# Patient Record
Sex: Female | Born: 1945
Health system: Southern US, Community
[De-identification: ages and names within clinical notes are randomized; demographics above are authoritative.]

## PROBLEM LIST (undated history)

## (undated) DIAGNOSIS — I639 Cerebral infarction, unspecified: Secondary | ICD-10-CM

## (undated) DIAGNOSIS — R002 Palpitations: Secondary | ICD-10-CM

## (undated) DIAGNOSIS — G4730 Sleep apnea, unspecified: Secondary | ICD-10-CM

## (undated) DIAGNOSIS — K5792 Diverticulitis of intestine, part unspecified, without perforation or abscess without bleeding: Secondary | ICD-10-CM

## (undated) DIAGNOSIS — Z91018 Allergy to other foods: Secondary | ICD-10-CM

## (undated) DIAGNOSIS — M1990 Unspecified osteoarthritis, unspecified site: Secondary | ICD-10-CM

## (undated) DIAGNOSIS — N390 Urinary tract infection, site not specified: Secondary | ICD-10-CM

## (undated) DIAGNOSIS — F419 Anxiety disorder, unspecified: Secondary | ICD-10-CM

## (undated) DIAGNOSIS — R51 Headache: Secondary | ICD-10-CM

## (undated) DIAGNOSIS — K7460 Unspecified cirrhosis of liver: Secondary | ICD-10-CM

## (undated) DIAGNOSIS — K648 Other hemorrhoids: Secondary | ICD-10-CM

## (undated) DIAGNOSIS — D649 Anemia, unspecified: Secondary | ICD-10-CM

## (undated) DIAGNOSIS — M2550 Pain in unspecified joint: Secondary | ICD-10-CM

## (undated) DIAGNOSIS — K589 Irritable bowel syndrome without diarrhea: Secondary | ICD-10-CM

## (undated) DIAGNOSIS — I10 Essential (primary) hypertension: Secondary | ICD-10-CM

## (undated) DIAGNOSIS — B1920 Unspecified viral hepatitis C without hepatic coma: Secondary | ICD-10-CM

## (undated) DIAGNOSIS — K5900 Constipation, unspecified: Secondary | ICD-10-CM

## (undated) DIAGNOSIS — F32A Depression, unspecified: Secondary | ICD-10-CM

## (undated) DIAGNOSIS — M549 Dorsalgia, unspecified: Secondary | ICD-10-CM

## (undated) DIAGNOSIS — I700 Atherosclerosis of aorta: Secondary | ICD-10-CM

## (undated) DIAGNOSIS — Q0700 Arnold-Chiari syndrome without spina bifida or hydrocephalus: Secondary | ICD-10-CM

## (undated) DIAGNOSIS — R0602 Shortness of breath: Secondary | ICD-10-CM

## (undated) DIAGNOSIS — K5790 Diverticulosis of intestine, part unspecified, without perforation or abscess without bleeding: Secondary | ICD-10-CM

## (undated) DIAGNOSIS — H35039 Hypertensive retinopathy, unspecified eye: Secondary | ICD-10-CM

## (undated) DIAGNOSIS — R7303 Prediabetes: Secondary | ICD-10-CM

## (undated) DIAGNOSIS — Z8711 Personal history of peptic ulcer disease: Secondary | ICD-10-CM

## (undated) DIAGNOSIS — H269 Unspecified cataract: Secondary | ICD-10-CM

## (undated) DIAGNOSIS — K449 Diaphragmatic hernia without obstruction or gangrene: Secondary | ICD-10-CM

## (undated) DIAGNOSIS — F329 Major depressive disorder, single episode, unspecified: Secondary | ICD-10-CM

## (undated) DIAGNOSIS — K635 Polyp of colon: Secondary | ICD-10-CM

## (undated) DIAGNOSIS — R519 Headache, unspecified: Secondary | ICD-10-CM

## (undated) HISTORY — DX: Depression, unspecified: F32.A

## (undated) HISTORY — DX: Arnold-Chiari syndrome without spina bifida or hydrocephalus: Q07.00

## (undated) HISTORY — DX: Essential (primary) hypertension: I10

## (undated) HISTORY — DX: Sleep apnea, unspecified: G47.30

## (undated) HISTORY — DX: Urinary tract infection, site not specified: N39.0

## (undated) HISTORY — PX: EYE SURGERY: SHX253

## (undated) HISTORY — DX: Hypertensive retinopathy, unspecified eye: H35.039

## (undated) HISTORY — DX: Diverticulitis of intestine, part unspecified, without perforation or abscess without bleeding: K57.92

## (undated) HISTORY — DX: Other hemorrhoids: K64.8

## (undated) HISTORY — DX: Prediabetes: R73.03

## (undated) HISTORY — DX: Unspecified cirrhosis of liver: K74.60

## (undated) HISTORY — DX: Palpitations: R00.2

## (undated) HISTORY — DX: Allergy to other foods: Z91.018

## (undated) HISTORY — DX: Headache, unspecified: R51.9

## (undated) HISTORY — DX: Unspecified viral hepatitis C without hepatic coma: B19.20

## (undated) HISTORY — DX: Polyp of colon: K63.5

## (undated) HISTORY — DX: Unspecified cataract: H26.9

## (undated) HISTORY — PX: CATARACT EXTRACTION: SUR2

## (undated) HISTORY — DX: Headache: R51

## (undated) HISTORY — PX: OVARIAN CYST REMOVAL: SHX89

## (undated) HISTORY — DX: Unspecified osteoarthritis, unspecified site: M19.90

## (undated) HISTORY — DX: Constipation, unspecified: K59.00

## (undated) HISTORY — DX: Personal history of peptic ulcer disease: Z87.11

## (undated) HISTORY — PX: BREAST EXCISIONAL BIOPSY: SUR124

## (undated) HISTORY — DX: Cerebral infarction, unspecified: I63.9

## (undated) HISTORY — DX: Diaphragmatic hernia without obstruction or gangrene: K44.9

## (undated) HISTORY — DX: Dorsalgia, unspecified: M54.9

## (undated) HISTORY — PX: OTHER SURGICAL HISTORY: SHX169

## (undated) HISTORY — DX: Anemia, unspecified: D64.9

## (undated) HISTORY — PX: REDUCTION MAMMAPLASTY: SUR839

## (undated) HISTORY — PX: BREAST SURGERY: SHX581

## (undated) HISTORY — DX: Pain in unspecified joint: M25.50

## (undated) HISTORY — DX: Irritable bowel syndrome, unspecified: K58.9

## (undated) HISTORY — DX: Atherosclerosis of aorta: I70.0

## (undated) HISTORY — DX: Anxiety disorder, unspecified: F41.9

## (undated) HISTORY — DX: Diverticulosis of intestine, part unspecified, without perforation or abscess without bleeding: K57.90

## (undated) HISTORY — DX: Shortness of breath: R06.02

## (undated) HISTORY — PX: DILATION AND CURETTAGE OF UTERUS: SHX78

---
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
## 2001-06-24 HISTORY — PX: BRAIN SURGERY: SHX531

## 2013-07-01 DIAGNOSIS — L02419 Cutaneous abscess of limb, unspecified: Secondary | ICD-10-CM | POA: Diagnosis not present

## 2013-07-01 DIAGNOSIS — S8010XA Contusion of unspecified lower leg, initial encounter: Secondary | ICD-10-CM | POA: Diagnosis not present

## 2013-07-12 DIAGNOSIS — L02419 Cutaneous abscess of limb, unspecified: Secondary | ICD-10-CM | POA: Diagnosis not present

## 2013-07-12 DIAGNOSIS — L03119 Cellulitis of unspecified part of limb: Secondary | ICD-10-CM | POA: Diagnosis not present

## 2013-07-12 DIAGNOSIS — IMO0002 Reserved for concepts with insufficient information to code with codable children: Secondary | ICD-10-CM | POA: Diagnosis not present

## 2013-07-27 DIAGNOSIS — M25569 Pain in unspecified knee: Secondary | ICD-10-CM | POA: Diagnosis not present

## 2013-10-13 DIAGNOSIS — I10 Essential (primary) hypertension: Secondary | ICD-10-CM | POA: Diagnosis not present

## 2013-10-15 DIAGNOSIS — Z1231 Encounter for screening mammogram for malignant neoplasm of breast: Secondary | ICD-10-CM | POA: Diagnosis not present

## 2013-10-18 DIAGNOSIS — I10 Essential (primary) hypertension: Secondary | ICD-10-CM | POA: Diagnosis not present

## 2013-10-18 DIAGNOSIS — F329 Major depressive disorder, single episode, unspecified: Secondary | ICD-10-CM | POA: Diagnosis not present

## 2013-10-18 DIAGNOSIS — F3289 Other specified depressive episodes: Secondary | ICD-10-CM | POA: Diagnosis not present

## 2013-12-10 DIAGNOSIS — R922 Inconclusive mammogram: Secondary | ICD-10-CM | POA: Diagnosis not present

## 2015-01-16 DIAGNOSIS — M179 Osteoarthritis of knee, unspecified: Secondary | ICD-10-CM | POA: Diagnosis not present

## 2015-08-02 DIAGNOSIS — F419 Anxiety disorder, unspecified: Secondary | ICD-10-CM | POA: Diagnosis not present

## 2015-08-02 DIAGNOSIS — G4700 Insomnia, unspecified: Secondary | ICD-10-CM | POA: Diagnosis not present

## 2015-08-02 DIAGNOSIS — M79671 Pain in right foot: Secondary | ICD-10-CM | POA: Diagnosis not present

## 2015-08-02 DIAGNOSIS — I10 Essential (primary) hypertension: Secondary | ICD-10-CM | POA: Diagnosis not present

## 2015-08-02 DIAGNOSIS — M25511 Pain in right shoulder: Secondary | ICD-10-CM | POA: Diagnosis not present

## 2015-09-21 ENCOUNTER — Ambulatory Visit (INDEPENDENT_AMBULATORY_CARE_PROVIDER_SITE_OTHER): Payer: Medicare Other | Admitting: Internal Medicine

## 2015-09-21 ENCOUNTER — Encounter: Payer: Self-pay | Admitting: Internal Medicine

## 2015-09-21 VITALS — BP 160/98 | HR 93 | Temp 97.9°F | Resp 16 | Ht 66.0 in | Wt 182.0 lb

## 2015-09-21 DIAGNOSIS — Q0700 Arnold-Chiari syndrome without spina bifida or hydrocephalus: Secondary | ICD-10-CM | POA: Insufficient documentation

## 2015-09-21 DIAGNOSIS — G4700 Insomnia, unspecified: Secondary | ICD-10-CM | POA: Diagnosis not present

## 2015-09-21 DIAGNOSIS — F4310 Post-traumatic stress disorder, unspecified: Secondary | ICD-10-CM | POA: Diagnosis not present

## 2015-09-21 DIAGNOSIS — I10 Essential (primary) hypertension: Secondary | ICD-10-CM

## 2015-09-21 MED ORDER — TEMAZEPAM 15 MG PO CAPS: 15.0000 mg | ORAL_CAPSULE | Freq: Every evening | ORAL | Status: DC | PRN

## 2015-09-21 MED ORDER — HYDROCHLOROTHIAZIDE 25 MG PO TABS: 25.0000 mg | ORAL_TABLET | Freq: Every day | ORAL | Status: DC

## 2015-09-21 MED ORDER — LORAZEPAM 0.5 MG PO TABS: 0.5000 mg | ORAL_TABLET | Freq: Two times a day (BID) | ORAL | Status: DC | PRN

## 2015-09-21 NOTE — Progress Notes (Signed)
Pre visit review using our clinic review tool, if applicable. No additional management support is needed unless otherwise documented below in the visit note. 

## 2015-09-21 NOTE — Patient Instructions (Signed)
We have sent in the refill of the hctz and given you the refill on the sleeping medicine and the lorazepam.   If you will get us a copy of your physical labs once you get them done.   Come back in about 3 months to check on the blood pressure.  It was a pleasure to meet you today. Please feel free to call us with problems or questions sooner.

## 2015-09-21 NOTE — Assessment & Plan Note (Signed)
Rx for hctz 25 mg daily, she [facility] through [facility] and will get in the next month and get us a copy. No known complications of that. Getting records to confirm.

## 2015-09-21 NOTE — Assessment & Plan Note (Signed)
Doing very well now and s/p many years of therapy. Uses rare lorazepam but not often. Refilled today.

## 2015-09-21 NOTE — Assessment & Plan Note (Signed)
S/P surgery and makes her high risk for spread to CNS and needs early initiation of antibiotics.

## 2015-09-21 NOTE — Assessment & Plan Note (Signed)
Refilled temazepam and uses rarely. Has worked well in the past and she has tried many OTC and herbal remedies for her insomnia without relief. Talked to her about the long term risk of sleeping medications and she agrees and knows to use only when needed.

## 2015-09-21 NOTE — Progress Notes (Signed)
   Subjective:    Patient ID: Angela Ramirez, female    DOB: 12/13/1945, 70 y.o.   MRN: [id number]  HPI The patient is a new 70 YO female coming in for insomnia. She has history of PTSD from her childhood which is under fairly good control right now. She does have cyclical problems with sleeping and uses temazepam during those times. She is currently in that cycle and does not have any temazepam. She had arnold chiari malformation and delayed diagnosis with extensive recovery. She does still have struggles with that and trouble trusting the medical establishment due to the way she was treated. Denies chest pains, SOB, constipation, diarrhea. She does exercise generally. She is very anxious today and is out of her blood pressure medication.   [facility], social history reviewed and updated.   Review of Systems  Constitutional: Negative for fever, activity change, appetite change, fatigue and unexpected weight change.  HENT: Negative.   Eyes: Negative.   Respiratory: Negative for cough, chest tightness, shortness of breath and wheezing.   Cardiovascular: Positive for palpitations. Negative for chest pain and leg swelling.       With anxiety attacks  Gastrointestinal: Negative for nausea, abdominal pain, diarrhea, constipation and abdominal distention.  Musculoskeletal: Negative for myalgias, back pain and arthralgias.  Skin: Negative.   Neurological: Negative.   Psychiatric/Behavioral: Positive for sleep disturbance and agitation. Negative for suicidal ideas, self-injury, dysphoric mood and decreased concentration. The patient is nervous/anxious.       Objective:   Physical Exam  Constitutional: She is oriented to person, place, and time. She appears well-developed and well-nourished.  HENT:  Head: Atraumatic.  Prior chiari surgery  Eyes: EOM are normal.  Neck: Normal range of motion.  Prominence over the right carotid, stable per patient for years  Cardiovascular: Normal rate and regular  rhythm.   No murmur heard. Pulmonary/Chest: Effort normal and breath sounds normal. No respiratory distress. She has no wheezes. She has no rales.  Abdominal: Soft. Bowel sounds are normal. She exhibits no distension. There is no tenderness. There is no rebound.  Musculoskeletal: She exhibits no edema.  Neurological: She is alert and oriented to person, place, and time. Coordination normal.  Skin: Skin is warm and dry.  Psychiatric: She has a normal mood and affect.   Filed Vitals:   09/21/15 0903  BP: 160/98  Pulse: 93  Temp: 97.9 F (36.6 C)  TempSrc: Oral  Resp: 16  Height: 5\' 6"  (1.676 m)  Weight: 182 lb (82.555 kg)  SpO2: 98%      Assessment & Plan:

## 2015-11-09 ENCOUNTER — Other Ambulatory Visit: Payer: Self-pay | Admitting: Internal Medicine

## 2015-11-09 NOTE — Telephone Encounter (Signed)
MD out of office pls advise on refill.../lmb 

## 2015-11-09 NOTE — Telephone Encounter (Signed)
Faxed script back to [facility].../lmb 

## 2016-01-11 ENCOUNTER — Other Ambulatory Visit (INDEPENDENT_AMBULATORY_CARE_PROVIDER_SITE_OTHER): Payer: Medicare Other

## 2016-01-11 ENCOUNTER — Encounter: Payer: Self-pay | Admitting: Internal Medicine

## 2016-01-11 ENCOUNTER — Ambulatory Visit (INDEPENDENT_AMBULATORY_CARE_PROVIDER_SITE_OTHER): Payer: Medicare Other | Admitting: Internal Medicine

## 2016-01-11 VITALS — BP 160/90 | HR 82 | Temp 98.3°F | Resp 16 | Ht 66.5 in | Wt 179.0 lb

## 2016-01-11 DIAGNOSIS — I10 Essential (primary) hypertension: Secondary | ICD-10-CM

## 2016-01-11 DIAGNOSIS — G4700 Insomnia, unspecified: Secondary | ICD-10-CM

## 2016-01-11 DIAGNOSIS — Z23 Encounter for immunization: Secondary | ICD-10-CM | POA: Diagnosis not present

## 2016-01-11 LAB — LIPID PANEL
CHOL/HDL RATIO: 2
Cholesterol: 189 mg/dL (ref 0–200)
HDL: 91.7 mg/dL (ref >39.00)
LDL Cholesterol: 89 mg/dL (ref 0–99)
NONHDL: 97.05
TRIGLYCERIDES: 42 mg/dL (ref 0.0–149.0)
VLDL: 8.4 mg/dL (ref 0.0–40.0)

## 2016-01-11 LAB — CBC
HCT: 42.1 % (ref 36.0–46.0)
Hemoglobin: 14.1 g/dL (ref 12.0–15.0)
MCHC: 33.4 g/dL (ref 30.0–36.0)
MCV: 90.2 fl (ref 78.0–100.0)
Platelets: 303 10*3/uL (ref 150.0–400.0)
RBC: 4.67 Mil/uL (ref 3.87–5.11)
RDW: 13.9 % (ref 11.5–15.5)
WBC: 7.1 10*3/uL (ref 4.0–10.5)

## 2016-01-11 LAB — COMPREHENSIVE METABOLIC PANEL
ALBUMIN: 4.4 g/dL (ref 3.5–5.2)
ALK PHOS: 83 U/L (ref 39–117)
ALT: 32 U/L (ref 0–35)
AST: 37 U/L (ref 0–37)
BUN: 14 mg/dL (ref 6–23)
CO2: 29 mEq/L (ref 19–32)
CREATININE: 0.66 mg/dL (ref 0.40–1.20)
Calcium: 9.9 mg/dL (ref 8.4–10.5)
Chloride: 95 mEq/L — ABNORMAL LOW (ref 96–112)
GFR: 94.76 mL/min (ref >60.00)
Glucose, Bld: 89 mg/dL (ref 70–99)
Potassium: 3.5 mEq/L (ref 3.5–5.1)
SODIUM: 134 meq/L — AB (ref 135–145)
TOTAL PROTEIN: 8.6 g/dL — AB (ref 6.0–8.3)
Total Bilirubin: 0.6 mg/dL (ref 0.2–1.2)

## 2016-01-11 NOTE — Assessment & Plan Note (Signed)
Checking CMP and adjust as needed. Reminded she needs to resume her hctz 25 mg daily as this helps her BP.

## 2016-01-11 NOTE — Progress Notes (Signed)
   Subjective:    Patient ID: Angela Ramirez, female    DOB: Jun 16, 1946, 70 y.o.   MRN: [id number]  HPI The patient is a 70 YO female coming in for follow up of her blood pressure. She is out of her medications for about 2 weeks. She has not had any headaches or migraines. No chest pains or SOB. No abdominal pain or nausea or vomiting.   Review of Systems  Constitutional: Negative for fever, activity change, appetite change, fatigue and unexpected weight change.  Respiratory: Negative for cough, chest tightness, shortness of breath and wheezing.   Cardiovascular: Negative for chest pain, palpitations and leg swelling.  Gastrointestinal: Negative for nausea, abdominal pain, diarrhea, constipation and abdominal distention.  Musculoskeletal: Negative for myalgias, back pain and arthralgias.  Skin: Negative.   Neurological: Negative.   Psychiatric/Behavioral: Positive for sleep disturbance. Negative for suicidal ideas, self-injury, dysphoric mood and decreased concentration.      Objective:   Physical Exam  Constitutional: She is oriented to person, place, and time. She appears well-developed and well-nourished.  HENT:  Head: Atraumatic.  Prior chiari surgery  Eyes: EOM are normal.  Neck: Normal range of motion.  Cardiovascular: Normal rate and regular rhythm.   No murmur heard. Pulmonary/Chest: Effort normal and breath sounds normal. No respiratory distress. She has no wheezes. She has no rales.  Abdominal: Soft. Bowel sounds are normal. She exhibits no distension. There is no tenderness. There is no rebound.  Musculoskeletal: She exhibits no edema.  Neurological: She is alert and oriented to person, place, and time. Coordination normal.  Skin: Skin is warm and dry.  Psychiatric: She has a normal mood and affect.    Filed Vitals:   01/11/16 1301  BP: 160/90  Pulse: 82  Temp: 98.3 F (36.8 C)  TempSrc: Oral  Resp: 16  Height: 5' 6.5" (1.689 m)  Weight: 179 lb (81.194 kg)    SpO2: 97%      Assessment & Plan:  Prevnar 13 and tdap given at visit

## 2016-01-11 NOTE — Progress Notes (Signed)
Pre visit review using our clinic review tool, if applicable. No additional management support is needed unless otherwise documented below in the visit note. 

## 2016-01-11 NOTE — Patient Instructions (Signed)
We will check the labs today and call you back with the results.    

## 2016-01-11 NOTE — Assessment & Plan Note (Addendum)
Temazepam not refilled today as 6 month supply done 09/21/15 and not due for refill. Reviewed [place] narcotic database and they do not have any record of her filling any controlled substances. Also will not refill lorazepam today for same reason.

## 2016-01-15 ENCOUNTER — Telehealth: Payer: Self-pay | Admitting: *Deleted

## 2016-01-15 MED ORDER — HYDROCHLOROTHIAZIDE 25 MG PO TABS: 25.0000 mg | ORAL_TABLET | Freq: Every day | ORAL | 3 refills | Status: DC

## 2016-01-15 MED ORDER — TEMAZEPAM 15 MG PO CAPS: 15.0000 mg | ORAL_CAPSULE | Freq: Every evening | ORAL | 3 refills | Status: DC | PRN

## 2016-01-15 NOTE — Telephone Encounter (Signed)
I have never prescribed lorazepam for her and have refilled her temazepam. Since both lorazepam and temazepam are the same kind of medication I do not prescribe both to patients.

## 2016-01-15 NOTE — Telephone Encounter (Signed)
Left msg on triage stating saw MD last week was told refills will be sent to pharmacy, but they did not received. Pt states she is needing her Lorazepam, Temazepam , and HCTZ. Sent Maintenance pls advise on controls...Raechel Chute

## 2016-01-15 NOTE — Telephone Encounter (Signed)
Called pt no answer LMOM (C) with md response. F[facility]ed [facility] to [facility]...Raechel Chute

## 2016-02-07 ENCOUNTER — Telehealth: Payer: Self-pay | Admitting: Internal Medicine

## 2016-02-07 NOTE — Telephone Encounter (Signed)
Patient walked in to request that we fill the LORazepam (ATIVAN) 0.5 MG tablet [[id number]][[id number]]  . She states that she would like to replace the sleeping pill with the ativan. She is mixing the ativan with an herbal remedy (with melatonin, lemon balm, passion flower). [facility]

## 2016-02-08 NOTE — Telephone Encounter (Signed)
She would need to come in for visit since I have never prescribed the ativan and she just got temazepam which is the same kind of medicine and I do not prescribe both at once to patients.

## 2016-02-08 NOTE — Telephone Encounter (Signed)
Called and no answer no vm- only rang

## 2016-02-16 ENCOUNTER — Other Ambulatory Visit: Payer: Self-pay | Admitting: Internal Medicine

## 2016-02-16 NOTE — Telephone Encounter (Signed)
Pt called back. States she has stopped taking the temazepam.She doesn't like how it makes her feel. She would prefer to take the Lorazepam instead. She also states Dr Okey Duprerawford has prescribed ativan for her before. Please follow up thanks.

## 2016-02-19 MED ORDER — LORAZEPAM 0.5 MG PO TABS
0.5000 mg | ORAL_TABLET | Freq: Two times a day (BID) | ORAL | 1 refills | Status: DC | PRN
Start: 2016-02-19 — End: 2017-08-18

## 2016-02-19 NOTE — Telephone Encounter (Signed)
Fine, we will remove temazepam from her list and rx ativan.

## 2016-02-19 NOTE — Telephone Encounter (Signed)
Faxed to [facility].

## 2016-05-29 ENCOUNTER — Ambulatory Visit: Payer: Medicare Other | Admitting: Internal Medicine

## 2016-07-06 ENCOUNTER — Encounter: Payer: Self-pay | Admitting: Family

## 2016-07-06 ENCOUNTER — Ambulatory Visit (INDEPENDENT_AMBULATORY_CARE_PROVIDER_SITE_OTHER): Payer: 59 | Admitting: Family

## 2016-07-06 VITALS — BP 130/80 | HR 97 | Temp 99.1°F | Resp 18 | Ht 66.5 in | Wt 191.0 lb

## 2016-07-06 DIAGNOSIS — J209 Acute bronchitis, unspecified: Secondary | ICD-10-CM

## 2016-07-06 MED ORDER — HYDROCODONE-HOMATROPINE 5-1.5 MG/5ML PO SYRP
5.0000 mL | ORAL_SOLUTION | Freq: Every evening | ORAL | 0 refills | Status: DC | PRN
Start: 2016-07-06 — End: 2016-08-29

## 2016-07-06 MED ORDER — ALBUTEROL SULFATE HFA 108 (90 BASE) MCG/ACT IN AERS: 2.0000 | INHALATION_SPRAY | Freq: Four times a day (QID) | RESPIRATORY_TRACT | 1 refills | Status: DC | PRN

## 2016-07-06 MED ORDER — AZITHROMYCIN 250 MG PO TABS: ORAL_TABLET | ORAL | 0 refills | Status: DC

## 2016-07-06 NOTE — Progress Notes (Signed)
Pre visit review using our clinic review tool, if applicable. No additional management support is needed unless otherwise documented below in the visit note. 

## 2016-07-06 NOTE — Patient Instructions (Signed)
If you need an inhaler at any point, I have sent it in.   Please take cough medication at night only as needed. As we discussed, I do not recommend dosing throughout the day as coughing is a protective mechanism . It also helps to break up thick mucous.  Do not take cough suppressants with alcohol as can lead to trouble breathing. Advise caution if taking cough suppressant and operating machinery ( i.e driving a car) as you may feel very tired.   Increase intake of clear fluids. Congestion is best treated by hydration, when mucus is wetter, it is thinner, less sticky, and easier to expel from the body, either through coughing up drainage, or by blowing your nose.   Get plenty of rest.   Use saline nasal drops and blow your nose frequently. Run a humidifier at night and elevate the head of the bed. Vicks Vapor rub will help with congestion and cough. Steam showers and sinus massage for congestion.   Use Acetaminophen or Ibuprofen as needed for fever or pain. Avoid second hand smoke. Even the smallest exposure will worsen symptoms.   Over the counter medications you can try include Delsym for cough, a decongestant for congestion, and Mucinex or Robitussin as an expectorant. Be sure to just get the plain Mucinex or Robitussin that just has one medication (Guaifenesen). We don't recommend the combination products. Note, be sure to drink two glasses of water with each dose of Mucinex as the medication will not work well without adequate hydration.   You can also try a teaspoon of honey to see if this will help reduce cough. Throat lozenges can sometimes be beneficial as well.    This illness will typically last 7 - 10 days.   Please follow up with our clinic if you develop a fever greater than 101 F, symptoms worsen, or do not resolve in the next week.

## 2016-07-06 NOTE — Progress Notes (Signed)
Subjective:    Patient ID: Angela Ramirez, female    DOB: 10/23/1945, 71 y.o.   MRN: [id number]  CC: Angela Ramirez is a 71 y.o. female who presents today for an acute visit.    HPI: CC: right ear ache, congestion, productive cough x one week ago, unchanged Started after being exposed to' mold-x paint', cough started then, then resolved.  No fever, chills, wheezing, SOB. Tried benadrly with some relief.    H/o arnold-chiari malformation.    Years ago needed an inhaler for wheezing.    HISTORY:  Past Medical History:  Diagnosis Date  . Arnold-Chiari malformation (HCC)   . Arthritis   . Colon polyps   . Headache   . Hypertension   . UTI (urinary tract infection)    Past Surgical History:  Procedure Laterality Date  . BRAIN SURGERY  2003   decompression  . BREAST SURGERY     biopsy  . polyp removal     Family History  Problem Relation Age of Onset  . Arthritis Mother   . Hypertension Mother   . Kidney disease Mother   . Diabetes Mother   . Alcohol abuse Father   . Arthritis Father   . Cancer Father     lung    Allergies: Morphine and related and Sulfa antibiotics Current Outpatient Prescriptions on File Prior to Visit  Medication Sig Dispense Refill  . hydrochlorothiazide (HYDRODIURIL) 25 MG tablet Take 1 tablet (25 mg total) by mouth daily. 90 tablet 3  . LORazepam (ATIVAN) 0.5 MG tablet Take 1 tablet (0.5 mg total) by mouth 2 (two) times daily as needed. 30 tablet 1   No current facility-administered medications on file prior to visit.     Social History  Substance Use Topics  . Smoking status: Former Games developer  . Smokeless tobacco: Not on file  . Alcohol use No    Review of Systems  Constitutional: Negative for chills and fever.  HENT: Positive for congestion, ear pain (right) and sore throat.   Eyes: Negative for visual disturbance.  Respiratory: Positive for cough. Negative for shortness of breath and wheezing.   Cardiovascular: Negative for chest  pain and palpitations.  Gastrointestinal: Negative for nausea and vomiting.  Neurological: Negative for headaches.      Objective:    BP 130/80 (BP Location: Left Arm, Patient Position: Sitting, Cuff Size: Large)   Pulse 97   Temp 99.1 F (37.3 C) (Oral)   Resp 18   Ht 5' 6.5" (1.689 m)   Wt 191 lb (86.6 kg)   SpO2 98%   BMI 30.37 kg/m    Physical Exam  Constitutional: She appears well-developed and well-nourished.  HENT:  Head: Normocephalic and atraumatic.  Right Ear: Hearing, tympanic membrane, external ear and ear canal normal. No drainage, swelling or tenderness. No foreign bodies. Tympanic membrane is not erythematous and not bulging. No middle ear effusion. No decreased hearing is noted.  Left Ear: Hearing, tympanic membrane, external ear and ear canal normal. No drainage, swelling or tenderness. No foreign bodies. Tympanic membrane is not erythematous and not bulging.  No middle ear effusion. No decreased hearing is noted.  Nose: Nose normal. No rhinorrhea. Right sinus exhibits no maxillary sinus tenderness and no frontal sinus tenderness. Left sinus exhibits no maxillary sinus tenderness and no frontal sinus tenderness.  Mouth/Throat: Uvula is midline, oropharynx is clear and moist and mucous membranes are normal. No oropharyngeal exudate, posterior oropharyngeal edema, posterior oropharyngeal erythema or tonsillar abscesses.  Eyes:  Conjunctivae are normal.  Cardiovascular: Regular rhythm, normal heart sounds and normal pulses.   Pulmonary/Chest: Effort normal and breath sounds normal. She has no wheezes. She has no rhonchi. She has no rales.  Lymphadenopathy:       Head (right side): No submental, no submandibular, no tonsillar, no preauricular, no posterior auricular and no occipital adenopathy present.       Head (left side): No submental, no submandibular, no tonsillar, no preauricular, no posterior auricular and no occipital adenopathy present.    She has no cervical  adenopathy.  Neurological: She is alert.  Skin: Skin is warm and dry.  Psychiatric: She has a normal mood and affect. Her speech is normal and behavior is normal. Thought content normal.  Vitals reviewed.      Assessment & Plan:   1. Acute bronchitis, unspecified organism Afebrile. sa02 98%. Based on duration of symptoms and patient's h/o arnold chiari, we jointly agreed to go ahead and start azithromycin. Gave rx for albuterol if patient were to have any wheezing, though not present today.  - HYDROcodone-homatropine (HYCODAN) 5-1.5 MG/5ML syrup; Take 5 mLs by mouth at bedtime as needed for cough.  Dispense: 30 mL; Refill: 0 - azithromycin (ZITHROMAX) 250 MG tablet; Tale 500 mg PO on day 1, then 250 mg PO q24h x 4 days.  Dispense: 6 tablet; Refill: 0 - albuterol (PROVENTIL HFA) 108 (90 Base) MCG/ACT inhaler; Inhale 2 puffs into the lungs every 6 (six) hours as needed for wheezing or shortness of breath.  Dispense: 1 Inhaler; Refill: 1    I am having Angela Ramirez maintain her hydrochlorothiazide and LORazepam.   No orders of the defined types were placed in this encounter.   Return precautions given.   Risks, benefits, and alternatives of the medications and treatment plan prescribed today were discussed, and patient expressed understanding.   Education regarding symptom management and diagnosis given to patient on AVS.  Continue to follow with Angela BrokerElizabeth A Crawford, MD for routine health maintenance.   Angela Ramirez and I agreed with plan.   Angela PlowmanMargaret Jilliana Burkes, FNP

## 2016-07-11 ENCOUNTER — Ambulatory Visit: Payer: Medicare Other | Admitting: Internal Medicine

## 2016-08-29 ENCOUNTER — Ambulatory Visit (INDEPENDENT_AMBULATORY_CARE_PROVIDER_SITE_OTHER): Payer: 59 | Admitting: Adult Health

## 2016-08-29 ENCOUNTER — Ambulatory Visit: Payer: Medicare Other | Admitting: Family Medicine

## 2016-08-29 VITALS — BP 154/78 | Temp 98.0°F | Ht 66.5 in | Wt 189.5 lb

## 2016-08-29 DIAGNOSIS — Z77120 Contact with and (suspected) exposure to mold (toxic): Secondary | ICD-10-CM | POA: Diagnosis not present

## 2016-08-29 MED ORDER — PREDNISONE 10 MG PO TABS: 10.0000 mg | ORAL_TABLET | Freq: Every day | ORAL | 0 refills | Status: DC

## 2016-08-29 NOTE — Progress Notes (Signed)
Subjective:    Patient ID: Angela Ramirez, female    DOB: 12-Apr-1946, 71 y.o.   MRN: [id number]  HPI 71 year old female who  has a past medical history of Arnold-Chiari malformation (HCC); Arthritis; Colon polyps; Headache; Hypertension; and UTI (urinary tract infection).  She was seen by Rennie Plowman on 07/06/2016 for bronchitis like symptoms after black mold exposure in her home ( was treated with Z pack, Hycodan and albuterol inhaler) . Over the weekend she was exposed to black mold again in her home. She reports that since that time she has experienced a dry cough, fatigue,and episodes of SOB.   She denies any n/v/d    Review of Systems See HPI   Past Medical History:  Diagnosis Date  . Arnold-Chiari malformation (HCC)   . Arthritis   . Colon polyps   . Headache   . Hypertension   . UTI (urinary tract infection)     Social History   Social History  . Marital status: Widowed    Spouse name: N/A  . Number of children: N/A  . Years of education: N/A   Occupational History  . Not on file.   Social History Main Topics  . Smoking status: Former Games developer  . Smokeless tobacco: Not on file  . Alcohol use No  . Drug use: No  . Sexual activity: Not on file   Other Topics Concern  . Not on file   Social History Narrative  . No narrative on file    Past Surgical History:  Procedure Laterality Date  . BRAIN SURGERY  2003   decompression  . BREAST SURGERY     biopsy  . polyp removal      Family History  Problem Relation Age of Onset  . Arthritis Mother   . Hypertension Mother   . Kidney disease Mother   . Diabetes Mother   . Alcohol abuse Father   . Arthritis Father   . Cancer Father     lung    Allergies  Allergen Reactions  . Morphine And Related   . Sulfa Antibiotics     Current Outpatient Prescriptions on File Prior to Visit  Medication Sig Dispense Refill  . azithromycin (ZITHROMAX) 250 MG tablet Tale 500 mg PO on day 1, then 250 mg PO q24h x  4 days. 6 tablet 0  . hydrochlorothiazide (HYDRODIURIL) 25 MG tablet Take 1 tablet (25 mg total) by mouth daily. 90 tablet 3  . albuterol (PROVENTIL HFA) 108 (90 Base) MCG/ACT inhaler Inhale 2 puffs into the lungs every 6 (six) hours as needed for wheezing or shortness of breath. (Patient not taking: Reported on 08/29/2016) 1 Inhaler 1  . LORazepam (ATIVAN) 0.5 MG tablet Take 1 tablet (0.5 mg total) by mouth 2 (two) times daily as needed. (Patient not taking: Reported on 08/29/2016) 30 tablet 1   No current facility-administered medications on file prior to visit.     BP (!) 154/78   Temp 98 F (36.7 C) (Oral)   Ht 5' 6.5" (1.689 m)   Wt 189 lb 8 oz (86 kg)   BMI 30.13 kg/m       Objective:   Physical Exam  Constitutional: She is oriented to person, place, and time. She appears well-developed and well-nourished. No distress.  HENT:  Head: Normocephalic and atraumatic.  Right Ear: External ear normal.  Left Ear: External ear normal.  Nose: Nose normal.  Mouth/Throat: Oropharynx is clear and moist. No oropharyngeal exudate.  Cardiovascular:  Normal rate, regular rhythm, normal heart sounds and intact distal pulses.  Exam reveals no gallop and no friction rub.   No murmur heard. Pulmonary/Chest: Effort normal and breath sounds normal. No respiratory distress. She has no wheezes. She has no rales. She exhibits no tenderness.  Musculoskeletal: She exhibits no edema, tenderness or deformity.  Neurological: She is alert and oriented to person, place, and time.  Skin: Skin is warm and dry. No rash noted. She is not diaphoretic. No erythema. No pallor.  Psychiatric: She has a normal mood and affect. Her behavior is normal. Judgment and thought content normal.  Nursing note and vitals reviewed.     Assessment & Plan:  1. Mold exposure - Will treat mold exposure symptoms with 5 days of low dose prednisone. Can take Mucinex for cough as well as use inhaler if she starts to feel SOB or wheezy -  predniSONE (DELTASONE) 10 MG tablet; Take 1 tablet (10 mg total) by mouth daily with breakfast.  Dispense: 5 tablet; Refill: 0 - Follow up as needed  Shirline Freesory Yosgart Pavey, NP

## 2016-09-04 ENCOUNTER — Telehealth: Payer: Self-pay | Admitting: Internal Medicine

## 2016-09-04 NOTE — Telephone Encounter (Signed)
Patient has been approved by Shirline Freesory Nafziger and Dr Okey Duprerawford [facility] whenever the patient is ready to do so.

## 2017-08-08 ENCOUNTER — Telehealth: Payer: Self-pay | Admitting: Internal Medicine

## 2017-08-08 MED ORDER — OSELTAMIVIR PHOSPHATE 75 MG PO CAPS: 75.0000 mg | ORAL_CAPSULE | Freq: Two times a day (BID) | ORAL | 0 refills | Status: DC

## 2017-08-08 NOTE — Telephone Encounter (Signed)
Please send to [facility] [phone] - [place], [place] - [address]

## 2017-08-08 NOTE — Telephone Encounter (Signed)
Copied from CRM [id number]. Topic: Quick Communication - See Telephone Encounter >> Aug 08, 2017  2:52 PM Diana EvesHoyt, Maryann B wrote: CRM for notification. See Telephone encounter for:  Pt's grandson was diagnosed with the Flu and she has been with him all week. And she was feeling fine but today she has had a headache and chills here and there and sneezing / runny nose. She is hoping tama flu could be called in for her today.   08/08/17.

## 2017-08-08 NOTE — Telephone Encounter (Signed)
Sent in tamiflu take 1 pill twice a day for 5 days.

## 2017-08-12 ENCOUNTER — Encounter: Payer: Medicare Other | Admitting: Internal Medicine

## 2017-08-18 ENCOUNTER — Encounter: Payer: Self-pay | Admitting: Internal Medicine

## 2017-08-18 ENCOUNTER — Ambulatory Visit (INDEPENDENT_AMBULATORY_CARE_PROVIDER_SITE_OTHER): Payer: PPO | Admitting: Internal Medicine

## 2017-08-18 ENCOUNTER — Telehealth: Payer: Self-pay

## 2017-08-18 ENCOUNTER — Other Ambulatory Visit (INDEPENDENT_AMBULATORY_CARE_PROVIDER_SITE_OTHER): Payer: PPO

## 2017-08-18 VITALS — BP 144/90 | HR 85 | Temp 97.8°F | Ht 66.5 in | Wt 196.0 lb

## 2017-08-18 DIAGNOSIS — G8929 Other chronic pain: Secondary | ICD-10-CM

## 2017-08-18 DIAGNOSIS — Z1159 Encounter for screening for other viral diseases: Secondary | ICD-10-CM | POA: Diagnosis not present

## 2017-08-18 DIAGNOSIS — F5104 Psychophysiologic insomnia: Secondary | ICD-10-CM | POA: Diagnosis not present

## 2017-08-18 DIAGNOSIS — Q0700 Arnold-Chiari syndrome without spina bifida or hydrocephalus: Secondary | ICD-10-CM | POA: Diagnosis not present

## 2017-08-18 DIAGNOSIS — M25511 Pain in right shoulder: Secondary | ICD-10-CM

## 2017-08-18 DIAGNOSIS — I10 Essential (primary) hypertension: Secondary | ICD-10-CM | POA: Diagnosis not present

## 2017-08-18 DIAGNOSIS — Z0000 Encounter for general adult medical examination without abnormal findings: Secondary | ICD-10-CM

## 2017-08-18 DIAGNOSIS — Z23 Encounter for immunization: Secondary | ICD-10-CM

## 2017-08-18 DIAGNOSIS — R7301 Impaired fasting glucose: Secondary | ICD-10-CM | POA: Diagnosis not present

## 2017-08-18 LAB — COMPREHENSIVE METABOLIC PANEL
ALBUMIN: 4.2 g/dL (ref 3.5–5.2)
ALK PHOS: 95 U/L (ref 39–117)
ALT: 27 U/L (ref 0–35)
AST: 31 U/L (ref 0–37)
BUN: 10 mg/dL (ref 6–23)
CALCIUM: 10.1 mg/dL (ref 8.4–10.5)
CO2: 29 mEq/L (ref 19–32)
Chloride: 102 mEq/L (ref 96–112)
Creatinine, Ser: 0.66 mg/dL (ref 0.40–1.20)
GFR: 94.31 mL/min (ref >60.00)
GLUCOSE: 98 mg/dL (ref 70–99)
POTASSIUM: 3.8 meq/L (ref 3.5–5.1)
Sodium: 141 mEq/L (ref 135–145)
TOTAL PROTEIN: 8.3 g/dL (ref 6.0–8.3)
Total Bilirubin: 0.4 mg/dL (ref 0.2–1.2)

## 2017-08-18 LAB — CBC
HEMATOCRIT: 40.5 % (ref 36.0–46.0)
HEMOGLOBIN: 13.9 g/dL (ref 12.0–15.0)
MCHC: 34.5 g/dL (ref 30.0–36.0)
MCV: 90.5 fl (ref 78.0–100.0)
Platelets: 285 10*3/uL (ref 150.0–400.0)
RBC: 4.47 Mil/uL (ref 3.87–5.11)
RDW: 13.7 % (ref 11.5–15.5)
WBC: 6.1 10*3/uL (ref 4.0–10.5)

## 2017-08-18 LAB — LIPID PANEL
CHOLESTEROL: 170 mg/dL (ref 0–200)
HDL: 62.3 mg/dL (ref >39.00)
LDL Cholesterol: 78 mg/dL (ref 0–99)
NonHDL: 107.77
TRIGLYCERIDES: 151 mg/dL — AB (ref 0.0–149.0)
Total CHOL/HDL Ratio: 3
VLDL: 30.2 mg/dL (ref 0.0–40.0)

## 2017-08-18 LAB — HEMOGLOBIN A1C: Hgb A1c MFr Bld: 5.8 % (ref 4.6–6.5)

## 2017-08-18 MED ORDER — SUVOREXANT 10 MG PO TABS: 10.0000 mg | ORAL_TABLET | Freq: Every evening | ORAL | 0 refills | Status: DC | PRN

## 2017-08-18 NOTE — Assessment & Plan Note (Signed)
Ordered x-ray for shoulder, this is a chronic issue and not exacerbated lately.

## 2017-08-18 NOTE — Assessment & Plan Note (Signed)
Stopped taking medication hctz and BP close to goal today with normal BP at home. She has made dietary changes and wishes to continue without medication. Checking CMP, lipid panel.

## 2017-08-18 NOTE — Assessment & Plan Note (Signed)
Ordered mammogram, flu shot given at visit. Pneumonia due later this year, counseled about shingles. Tetanus up to date. Declines dexa right now. Colonoscopy up to date. Pap smear aged out. Counseled about sun safety and mole surveillance. Given 10 year screening recommendations.

## 2017-08-18 NOTE — Progress Notes (Signed)
   Subjective:    Patient ID: Angela BlightSheila Donati, female    DOB: 08/25/1945, 72 y.o.   MRN: [id number]  HPI  Here for medicare wellness and physical, no new complaints. Please see A/P for status and treatment of chronic medical problems.   Diet: heart healthy Physical activity: sedentary Depression/mood screen: negative Hearing: intact to whispered voice Visual acuity: grossly normal, performs annual eye exam  ADLs: capable Fall risk: none Home safety: good Cognitive evaluation: intact to orientation, naming, recall and repetition EOL planning: adv directives discussed  I have personally reviewed and have noted 1. The patient's medical and social history - reviewed today no changes 2. Their use of alcohol, tobacco or illicit drugs 3. Their current medications and supplements 4. The patient's functional ability including ADL's, fall risks, home safety risks and hearing or visual impairment. 5. Diet and physical activities 6. Evidence for depression or mood disorders 7. Care team reviewed and updated (available in snapshot)  Review of Systems  Constitutional: Negative.   HENT: Negative.   Eyes: Negative.   Respiratory: Negative for cough, chest tightness and shortness of breath.   Cardiovascular: Negative for chest pain, palpitations and leg swelling.  Gastrointestinal: Negative for abdominal distention, abdominal pain, constipation, diarrhea, nausea and vomiting.  Musculoskeletal: Positive for arthralgias.  Skin: Negative.   Neurological: Negative.   Psychiatric/Behavioral: Negative.       Objective:   Physical Exam  Constitutional: She is oriented to person, place, and time. She appears well-developed and well-nourished.  HENT:  Head: Normocephalic and atraumatic.  Eyes: EOM are normal.  Neck: Normal range of motion.  Cardiovascular: Normal rate and regular rhythm.  Pulmonary/Chest: Effort normal and breath sounds normal. No respiratory distress. She has no wheezes. She has  no rales.  Abdominal: Soft. Bowel sounds are normal. She exhibits no distension. There is no tenderness. There is no rebound.  Musculoskeletal: She exhibits no edema.  Right shoulder pain, chronic  Neurological: She is alert and oriented to person, place, and time. Coordination normal.  Skin: Skin is warm and dry.  Psychiatric: She has a normal mood and affect.   Vitals:   08/18/17 0911  BP: (!) 144/90  Pulse: 85  Temp: 97.8 F (36.6 C)  TempSrc: Oral  SpO2: 96%  Weight: 196 lb (88.9 kg)  Height: 5' 6.5" (1.689 m)      Assessment & Plan:  Flu shot given at visit

## 2017-08-18 NOTE — Patient Instructions (Signed)
We have put in for the mammogram today and the shoulder x-ray.   We will check the labs.   Consider ib guard which has peppermint oil or trying peppermint oil for the stomach symptoms.   Health Maintenance, Female Adopting a healthy lifestyle and getting preventive care can go a long way to promote health and wellness. Talk with your health care provider about what schedule of regular examinations is right for you. This is a good chance for you to check in with your provider about disease prevention and staying healthy. In between checkups, there are plenty of things you can do on your own. Experts have done a lot of research about which lifestyle changes and preventive measures are most likely to keep you healthy. Ask your health care provider for more information. Weight and diet Eat a healthy diet  Be sure to include plenty of vegetables, fruits, low-fat dairy products, and lean protein.  Do not eat a lot of foods high in solid fats, added sugars, or salt.  Get regular exercise. This is one of the most important things you can do for your health. ? Most adults should exercise for at least 150 minutes each week. The exercise should increase your heart rate and make you sweat (moderate-intensity exercise). ? Most adults should also do strengthening exercises at least twice a week. This is in addition to the moderate-intensity exercise.  Maintain a healthy weight  Body mass index (BMI) is a measurement that can be used to identify possible weight problems. It estimates body fat based on height and weight. Your health care provider can help determine your BMI and help you achieve or maintain a healthy weight.  For females 91 years of age and older: ? A BMI below 18.5 is considered underweight. ? A BMI of 18.5 to 24.9 is normal. ? A BMI of 25 to 29.9 is considered overweight. ? A BMI of 30 and above is considered obese.  Watch levels of cholesterol and blood lipids  You should start  having your blood tested for lipids and cholesterol at 70 years of age, then have this test every 5 years.  You may need to have your cholesterol levels checked more often if: ? Your lipid or cholesterol levels are high. ? You are older than 70 years of age. ? You are at high risk for heart disease.  Cancer screening Lung Cancer  Lung cancer screening is recommended for adults 32-52 years old who are at high risk for lung cancer because of a history of smoking.  A yearly low-dose CT scan of the lungs is recommended for people who: ? Currently smoke. ? Have quit within the past 15 years. ? Have at least a 30-pack-year history of smoking. A pack year is smoking an average of one pack of cigarettes a day for 1 year.  Yearly screening should continue until it has been 15 years since you quit.  Yearly screening should stop if you develop a health problem that would prevent you from having lung cancer treatment.  Breast Cancer  Practice breast self-awareness. This means understanding how your breasts normally appear and feel.  It also means doing regular breast self-exams. Let your health care provider know about any changes, no matter how small.  If you are in your 20s or 30s, you should have a clinical breast exam (CBE) by a health care provider every 1-3 years as part of a regular health exam.  If you are 75 or older, have a  CBE every year. Also consider having a breast X-ray (mammogram) every year.  If you have a family history of breast cancer, talk to your health care provider about genetic screening.  If you are at high risk for breast cancer, talk to your health care provider about having an MRI and a mammogram every year.  Breast cancer gene (BRCA) assessment is recommended for women who have family members with BRCA-related cancers. BRCA-related cancers include: ? Breast. ? Ovarian. ? Tubal. ? Peritoneal cancers.  Results of the assessment will determine the need for  genetic counseling and BRCA1 and BRCA2 testing.  Cervical Cancer Your health care provider may recommend that you be screened regularly for cancer of the pelvic organs (ovaries, uterus, and vagina). This screening involves a pelvic examination, including checking for microscopic changes to the surface of your cervix (Pap test). You may be encouraged to have this screening done every 3 years, beginning at age 68.  For women ages 91-65, health care providers may recommend pelvic exams and Pap testing every 3 years, or they may recommend the Pap and pelvic exam, combined with testing for human papilloma virus (HPV), every 5 years. Some types of HPV increase your risk of cervical cancer. Testing for HPV may also be done on women of any age with unclear Pap test results.  Other health care providers may not recommend any screening for nonpregnant women who are considered low risk for pelvic cancer and who do not have symptoms. Ask your health care provider if a screening pelvic exam is right for you.  If you have had past treatment for cervical cancer or a condition that could lead to cancer, you need Pap tests and screening for cancer for at least 20 years after your treatment. If Pap tests have been discontinued, your risk factors (such as having a new sexual partner) need to be reassessed to determine if screening should resume. Some women have medical problems that increase the chance of getting cervical cancer. In these cases, your health care provider may recommend more frequent screening and Pap tests.  Colorectal Cancer  This type of cancer can be detected and often prevented.  Routine colorectal cancer screening usually begins at 70 years of age and continues through 70 years of age.  Your health care provider may recommend screening at an earlier age if you have risk factors for colon cancer.  Your health care provider may also recommend using home test kits to check for hidden blood in the  stool.  A small camera at the end of a tube can be used to examine your colon directly (sigmoidoscopy or colonoscopy). This is done to check for the earliest forms of colorectal cancer.  Routine screening usually begins at age 68.  Direct examination of the colon should be repeated every 5-10 years through 70 years of age. However, you may need to be screened more often if early forms of precancerous polyps or small growths are found.  Skin Cancer  Check your skin from head to toe regularly.  Tell your health care provider about any new moles or changes in moles, especially if there is a change in a mole's shape or color.  Also tell your health care provider if you have a mole that is larger than the size of a pencil eraser.  Always use sunscreen. Apply sunscreen liberally and repeatedly throughout the day.  Protect yourself by wearing long sleeves, pants, a wide-brimmed hat, and sunglasses whenever you are outside.  Heart disease,  diabetes, and high blood pressure  High blood pressure causes heart disease and increases the risk of stroke. High blood pressure is more likely to develop in: ? People who have blood pressure in the high end of the normal range (130-139/85-89 mm Hg). ? People who are overweight or obese. ? People who are African American.  If you are 35-41 years of age, have your blood pressure checked every 3-5 years. If you are 68 years of age or older, have your blood pressure checked every year. You should have your blood pressure measured twice-once when you are at a hospital or clinic, and once when you are not at a hospital or clinic. Record the average of the two measurements. To check your blood pressure when you are not at a hospital or clinic, you can use: ? An automated blood pressure machine at a pharmacy. ? A home blood pressure monitor.  If you are between 62 years and 22 years old, ask your health care provider if you should take aspirin to prevent  strokes.  Have regular diabetes screenings. This involves taking a blood sample to check your fasting blood sugar level. ? If you are at a normal weight and have a low risk for diabetes, have this test once every three years after 70 years of age. ? If you are overweight and have a high risk for diabetes, consider being tested at a younger age or more often. Preventing infection Hepatitis B  If you have a higher risk for hepatitis B, you should be screened for this virus. You are considered at high risk for hepatitis B if: ? You were born in a country where hepatitis B is common. Ask your health care provider which countries are considered high risk. ? Your parents were born in a high-risk country, and you have not been immunized against hepatitis B (hepatitis B vaccine). ? You have HIV or AIDS. ? You use needles to inject street drugs. ? You live with someone who has hepatitis B. ? You have had sex with someone who has hepatitis B. ? You get hemodialysis treatment. ? You take certain medicines for conditions, including cancer, organ transplantation, and autoimmune conditions.  Hepatitis C  Blood testing is recommended for: ? Everyone born from [birth year] through 1963. ? Anyone with known risk factors for hepatitis C.  Sexually transmitted infections (STIs)  You should be screened for sexually transmitted infections (STIs) including gonorrhea and chlamydia if: ? You are sexually active and are younger than 70 years of age. ? You are older than 70 years of age and your health care provider tells you that you are at risk for this type of infection. ? Your sexual activity has changed since you were last screened and you are at an increased risk for chlamydia or gonorrhea. Ask your health care provider if you are at risk.  If you do not have HIV, but are at risk, it Michaelis be recommended that you take a prescription medicine daily to prevent HIV infection. This is called pre-exposure prophylaxis  (PrEP). You are considered at risk if: ? You are sexually active and do not regularly use condoms or know the HIV status of your partner(s). ? You take drugs by injection. ? You are sexually active with a partner who has HIV.  Talk with your health care provider about whether you are at high risk of being infected with HIV. If you choose to begin PrEP, you should first be tested for HIV. You should  then be tested every 3 months for as long as you are taking PrEP. Pregnancy  If you are premenopausal and you may become pregnant, ask your health care provider about preconception counseling.  If you may become pregnant, take 400 to 800 micrograms (mcg) of folic acid every day.  If you want to prevent pregnancy, talk to your health care provider about birth control (contraception). Osteoporosis and menopause  Osteoporosis is a disease in which the bones lose minerals and strength with aging. This can result in serious bone fractures. Your risk for osteoporosis can be identified using a bone density scan.  If you are 40 years of age or older, or if you are at risk for osteoporosis and fractures, ask your health care provider if you should be screened.  Ask your health care provider whether you should take a calcium or vitamin D supplement to lower your risk for osteoporosis.  Menopause may have certain physical symptoms and risks.  Hormone replacement therapy may reduce some of these symptoms and risks. Talk to your health care provider about whether hormone replacement therapy is right for you. Follow these instructions at home:  Schedule regular health, dental, and eye exams.  Stay current with your immunizations.  Do not use any tobacco products including cigarettes, chewing tobacco, or electronic cigarettes.  If you are pregnant, do not drink alcohol.  If you are breastfeeding, limit how much and how often you drink alcohol.  Limit alcohol intake to no more than 1 drink per day for  nonpregnant women. One drink equals 12 ounces of beer, 5 ounces of wine, or 1 ounces of hard liquor.  Do not use street drugs.  Do not share needles.  Ask your health care provider for help if you need support or information about quitting drugs.  Tell your health care provider if you often feel depressed.  Tell your health care provider if you have ever been abused or do not feel safe at home. This information is not intended to replace advice given to you by your health care provider. Make sure you discuss any questions you have with your health care provider. Document Released: 12/24/2010 Document Revised: 11/16/2015 Document Reviewed: 03/14/2015 Elsevier Interactive Patient Education  [employer].

## 2017-08-18 NOTE — Telephone Encounter (Signed)
Done

## 2017-08-18 NOTE — Assessment & Plan Note (Signed)
Rx for belsomra as she is doing essential oils and does not need daily medication and does not want habit forming medication.

## 2017-08-18 NOTE — Assessment & Plan Note (Signed)
Need for early antibiotics due to risk of CNS spread.

## 2017-08-18 NOTE — Telephone Encounter (Signed)
Patient stated before she left that a sleeping aid was going to be sent in for her today, I believe she was talking about belsomra? Please advise

## 2017-08-18 NOTE — Telephone Encounter (Signed)
Patient informed. 

## 2017-08-21 LAB — HCV RNA,QUANTITATIVE REAL TIME PCR
HCV Quantitative Log: 6.2 Log IU/mL — ABNORMAL HIGH
HCV RNA, PCR, QN: 1570000 [IU]/mL — AB

## 2017-08-21 LAB — HEPATITIS C ANTIBODY
Hepatitis C Ab: REACTIVE — AB
SIGNAL TO CUT-OFF: 30.1 — ABNORMAL HIGH (ref <1.00)

## 2017-08-26 ENCOUNTER — Ambulatory Visit (INDEPENDENT_AMBULATORY_CARE_PROVIDER_SITE_OTHER): Payer: PPO | Admitting: Internal Medicine

## 2017-08-26 ENCOUNTER — Encounter: Payer: Self-pay | Admitting: Internal Medicine

## 2017-08-26 ENCOUNTER — Ambulatory Visit (INDEPENDENT_AMBULATORY_CARE_PROVIDER_SITE_OTHER)
Admission: RE | Admit: 2017-08-26 | Discharge: 2017-08-26 | Disposition: A | Discharge status: Home / Self Care | Payer: PPO | Source: Ambulatory Visit | Attending: Internal Medicine | Admitting: Internal Medicine

## 2017-08-26 VITALS — BP 160/100 | HR 86 | Temp 97.8°F | Ht 66.5 in | Wt 197.0 lb

## 2017-08-26 DIAGNOSIS — G8929 Other chronic pain: Secondary | ICD-10-CM

## 2017-08-26 DIAGNOSIS — M25511 Pain in right shoulder: Secondary | ICD-10-CM

## 2017-08-26 DIAGNOSIS — B182 Chronic viral hepatitis C: Secondary | ICD-10-CM

## 2017-08-26 NOTE — Assessment & Plan Note (Signed)
Referral to hepatology for evaluation of treatment for hep c. She has not had LFT elevation in the past.

## 2017-08-26 NOTE — Patient Instructions (Addendum)
We will get you in to the liver clinic to check out further the hepatitis C.   The breast center number is [phone] to set up the mammogram.

## 2017-08-26 NOTE — Progress Notes (Signed)
   Subjective:    Patient ID: Angela BlightSheila Ramirez, female    DOB: [birth year], 72 y.o.   MRN: [id number]  HPI The patient is a 72 YO female coming in for hepatitis C positive on screening test. She is very concerned and confused about how she got this. Denies multiple partners, drug use. Did have transfusion with birth of twins. She denies pain in her stomach. She denies any known exposures.   Review of Systems  Constitutional: Negative.   HENT: Negative.   Eyes: Negative.   Respiratory: Negative for cough, chest tightness and shortness of breath.   Cardiovascular: Negative for chest pain, palpitations and leg swelling.  Gastrointestinal: Negative for abdominal distention, abdominal pain, constipation, diarrhea, nausea and vomiting.  Musculoskeletal: Negative.   Skin: Negative.   Neurological: Negative.   Psychiatric/Behavioral: Negative.       Objective:   Physical Exam  Constitutional: She is oriented to person, place, and time. She appears well-developed and well-nourished.  HENT:  Head: Normocephalic and atraumatic.  Eyes: EOM are normal.  Neck: Normal range of motion.  Cardiovascular: Normal rate and regular rhythm.  Pulmonary/Chest: Effort normal and breath sounds normal. No respiratory distress. She has no wheezes. She has no rales.  Abdominal: Soft. Bowel sounds are normal. She exhibits no distension. There is no tenderness. There is no rebound.  Musculoskeletal: She exhibits no edema.  Neurological: She is alert and oriented to person, place, and time. Coordination normal.  Skin: Skin is warm and dry.   Vitals:   08/26/17 1059  BP: (!) 160/100  Pulse: 86  Temp: 97.8 F (36.6 C)  TempSrc: Oral  SpO2: 93%  Weight: 197 lb (89.4 kg)  Height: 5' 6.5" (1.689 m)      Assessment & Plan:

## 2017-09-01 ENCOUNTER — Other Ambulatory Visit: Payer: Self-pay | Admitting: Internal Medicine

## 2017-09-01 DIAGNOSIS — B182 Chronic viral hepatitis C: Secondary | ICD-10-CM

## 2017-09-17 ENCOUNTER — Ambulatory Visit (INDEPENDENT_AMBULATORY_CARE_PROVIDER_SITE_OTHER): Payer: PPO | Admitting: Internal Medicine

## 2017-09-17 ENCOUNTER — Encounter: Payer: Self-pay | Admitting: Internal Medicine

## 2017-09-17 VITALS — Ht 66.5 in | Wt 196.0 lb

## 2017-09-17 DIAGNOSIS — B182 Chronic viral hepatitis C: Secondary | ICD-10-CM

## 2017-09-17 NOTE — Progress Notes (Signed)
[facility]   CC: consideration for treatment for chronic hepatitis C  HPI:  +Angela Ramirez is a 72 y.o. female who presents for initial evaluation and management of chronic hepatitis C.  Patient tested positive earlier this year during routine screening. Hepatitis C-associated risk factors present are: history of blood transfusion (details: 1966 during childbirth). Patient denies IV drug abuse, multiple sexual partners, renal dialysis, sexual contact with person with liver disease, tattoos. Patient has had other studies performed. Results: hepatitis C RNA by PCR, result: positive. Patient has not had prior treatment for Hepatitis C. Patient does not have a past history of liver disease. Patient does not have a family history of liver disease. Patient does not  have associated signs or symptoms related to liver disease.  Labs reviewed and confirm chronic hepatitis C with a positive viral load.   Records reviewed from Epic and no recent scan of the liver.        Patient does not have documented immunity to Hepatitis A. Patient does not have documented immunity to Hepatitis B.    Review of Systems:  Constitutional: positive for fatigue Gastrointestinal: positive for constipation Integument/breast: negative for rash All other systems reviewed and are negative       Past Medical History:  Diagnosis Date  . Arnold-Chiari malformation (HCC)   . Arthritis   . Colon polyps   . Headache   . Hypertension   . UTI (urinary tract infection)     Prior to Admission medications   Medication Sig Start Date End Date Taking? Authorizing Provider  aspirin-acetaminophen-caffeine (EXCEDRIN MIGRAINE) [id number] MG tablet Take 1 tablet by mouth every 6 (six) hours as needed for headache.   Yes [provider]  diphenhydrAMINE (SOMINEX) 25 MG tablet Take 25 mg by mouth at bedtime as needed for sleep.   Yes [provider]    Allergies  Allergen Reactions   . Morphine And Related   . Sulfa Antibiotics     Social History   Tobacco Use  . Smoking status: Former Games developermoker  . Smokeless tobacco: Never Used  Substance Use Topics  . Alcohol use: No    Alcohol/week: 0.0 oz  . Drug use: No    Family History  Problem Relation Age of Onset  . Arthritis Mother   . Hypertension Mother   . Kidney disease Mother   . Diabetes Mother   . Alcohol abuse Father   . Arthritis Father   . Cancer Father        lung     Objective:  Constitutional: in no apparent distress and alert Vitals:   09/17/17 1348  Weight: 196 lb (88.9 kg)  Height: 5' 6.5" (1.689 m)   Eyes: anicteric Cardiovascular: Cor RRR Respiratory: CTA B; normal respiratory effort Gastrointestinal: liver is not enlarged, spleen is not enlarged, soft, nt Musculoskeletal: no pedal edema noted Skin: negatives: no rash; no porphyria cutanea tarda Lymphatic: no cervical lymphadenopathy   Laboratory Genotype: No results found for: HCVGENOTYPE HCV viral load: No results found for: HCVQUANT Lab Results  Component Value Date   WBC 6.1 08/18/2017   HGB 13.9 08/18/2017   HCT 40.5 08/18/2017   MCV 90.5 08/18/2017   PLT 285.0 08/18/2017    Lab Results  Component Value Date   CREATININE 0.66 08/18/2017   BUN 10 08/18/2017   NA 141 08/18/2017   K 3.8 08/18/2017   CL 102 08/18/2017   CO2 29 08/18/2017    Lab Results  Component Value Date   ALT 27 08/18/2017   AST 31 08/18/2017   ALKPHOS 95 08/18/2017     Labs and history reviewed and show CHILD-PUGH unknown  5-6 points: Child class A 7-9 points: Child class B 10-15 points: Child class C  Lab Results  Component Value Date   BILITOT 0.4 08/18/2017   ALBUMIN 4.2 08/18/2017     Assessment: New Patient with Chronic Hepatitis C genotype unknown, untreated.  I discussed with the patient the lab findings that confirm chronic hepatitis C as well as the natural history and progression of disease including about 30% of people  who develop cirrhosis of the liver if left untreated and once cirrhosis is established there is a 2-7% risk per year of liver cancer and liver failure.  I discussed the importance of treatment and benefits in reducing the risk, even if significant liver fibrosis exists.   Plan: 1) Patient counseled extensively on limiting acetaminophen to no more than 2 grams daily, avoidance of alcohol. 2) Transmission discussed with patient including sexual transmission, sharing razors and toothbrush.   3) Will need referral to gastroenterology if concern for cirrhosis 4) Will need referral for substance abuse counseling: No.; Further work up to include urine drug screen  No. 5) Will prescribe appropriate medication based on genotype and coverage  6) Hepatitis A and B titers 7) Pneumovax vaccine previously given 9) Further work up to include liver staging with elastography 10) will follow up after results to discuss treatment options.

## 2017-09-19 ENCOUNTER — Ambulatory Visit (HOSPITAL_COMMUNITY)
Admission: RE | Admit: 2017-09-19 | Discharge: 2017-09-19 | Disposition: A | Discharge status: Home / Self Care | Payer: PPO | Source: Ambulatory Visit | Attending: Internal Medicine | Admitting: Internal Medicine

## 2017-09-19 DIAGNOSIS — Z7982 Long term (current) use of aspirin: Secondary | ICD-10-CM | POA: Insufficient documentation

## 2017-09-19 DIAGNOSIS — Z79899 Other long term (current) drug therapy: Secondary | ICD-10-CM | POA: Diagnosis not present

## 2017-09-19 DIAGNOSIS — B182 Chronic viral hepatitis C: Secondary | ICD-10-CM | POA: Diagnosis not present

## 2017-09-19 LAB — HIV ANTIBODY (ROUTINE TESTING W REFLEX): HIV 1&2 Ab, 4th Generation: NONREACTIVE

## 2017-09-19 LAB — HEPATITIS B SURFACE ANTIGEN: HEP B S AG: NONREACTIVE

## 2017-09-19 LAB — HEPATITIS B CORE ANTIBODY, TOTAL: HEP B C TOTAL AB: NONREACTIVE

## 2017-09-19 LAB — PROTIME-INR
INR: 0.9
Prothrombin Time: 10 s (ref 9.0–11.5)

## 2017-09-19 LAB — HEPATITIS A ANTIBODY, TOTAL: HEPATITIS A AB,TOTAL: REACTIVE — AB

## 2017-09-19 LAB — HEPATITIS B SURFACE ANTIBODY,QUALITATIVE: HEP B S AB: NONREACTIVE

## 2017-09-20 LAB — HEPATITIS C GENOTYPE

## 2017-09-22 LAB — LIVER FIBROSIS, FIBROTEST-ACTITEST
ALPHA-2-MACROGLOBULIN: 299 mg/dL — AB (ref 106–279)
ALT: 25 U/L (ref 6–29)
Apolipoprotein A1: 174 mg/dL (ref 101–198)
Bilirubin: 0.3 mg/dL (ref 0.2–1.2)
FIBROSIS SCORE: 0.34
GGT: 64 U/L (ref 3–65)
Haptoglobin: 141 mg/dL (ref 43–212)
NECROINFLAMMAT ACT SCORE: 0.12
REFERENCE ID: 2398934

## 2017-10-09 ENCOUNTER — Telehealth: Payer: Self-pay | Admitting: Internal Medicine

## 2017-10-09 ENCOUNTER — Encounter: Payer: Self-pay | Admitting: Internal Medicine

## 2017-10-09 ENCOUNTER — Ambulatory Visit (INDEPENDENT_AMBULATORY_CARE_PROVIDER_SITE_OTHER): Payer: PPO | Admitting: Internal Medicine

## 2017-10-09 VITALS — BP 168/83 | HR 105 | Temp 97.8°F | Ht 66.5 in | Wt 194.0 lb

## 2017-10-09 DIAGNOSIS — Z23 Encounter for immunization: Secondary | ICD-10-CM | POA: Insufficient documentation

## 2017-10-09 DIAGNOSIS — K7400 Hepatic fibrosis, unspecified: Secondary | ICD-10-CM | POA: Insufficient documentation

## 2017-10-09 DIAGNOSIS — Z7189 Other specified counseling: Secondary | ICD-10-CM | POA: Diagnosis not present

## 2017-10-09 DIAGNOSIS — Z7185 Encounter for immunization safety counseling: Secondary | ICD-10-CM

## 2017-10-09 DIAGNOSIS — B182 Chronic viral hepatitis C: Secondary | ICD-10-CM

## 2017-10-09 MED ORDER — LEDIPASVIR-SOFOSBUVIR 90-400 MG PO TABS: 1.0000 | ORAL_TABLET | Freq: Every day | ORAL | 2 refills | Status: DC

## 2017-10-09 MED ORDER — HYDROCHLOROTHIAZIDE 25 MG PO TABS: 25.0000 mg | ORAL_TABLET | Freq: Every day | ORAL | 3 refills | Status: DC

## 2017-10-09 NOTE — Assessment & Plan Note (Signed)
Will get her on treatment with [facility] or [facility].   She will be contacted when approved and follow up with PharmD

## 2017-10-09 NOTE — Telephone Encounter (Signed)
LVM informing patient.

## 2017-10-09 NOTE — Patient Instructions (Signed)
Date 10/09/17  Dear Ms Angela Ramirez, As discussed in the ID Clinic, your hepatitis C therapy will include the following medications:          Harvoni 90mg /400mg  tablet:           Take 1 tablet by mouth once daily   Please note that ALL MEDICATIONS WILL START ON THE SAME DATE for a total of 12 weeks. ---------------------------------------------------------------- Your HCV Treatment Start Date: TBA   Your HCV genotype:  1b    Liver Fibrosis: F3/4    ---------------------------------------------------------------- YOUR PHARMACY CONTACT:   [facility] [address] [place], [place] [address] Phone: [phone] Hours: Monday to Friday 7:30 am to 6:00 pm   Please always contact your pharmacy at least 3-4 business days before you run out of medications to ensure your next month's medication is ready or 1 week prior to running out if you receive it by mail.  Remember, each prescription is for 28 days. ---------------------------------------------------------------- GENERAL NOTES REGARDING YOUR HEPATITIS C MEDICATION:  SOFOSBUVIR/LEDIPASVIR (HARVONI): - Harvoni tablet is taken daily with OR without food. - The tablets are orange. - The tablets should be stored at room temperature.  - Acid reducing agents such as H2 blockers (ie. Pepcid (famotidine), Zantac (ranitidine), Tagamet (cimetidine), Axid (nizatidine) and proton pump inhibitors (ie. Prilosec (omeprazole), Protonix (pantoprazole), Nexium (esomeprazole), or Aciphex (rabeprazole)) can decrease effectiveness of Harvoni. Do not take until you have discussed with a health care provider.    -Antacids that contain magnesium and/or aluminum hydroxide (ie. Milk of Magensia, Rolaids, Gaviscon, Maalox, Mylanta, an dArthritis Pain Formula)can reduce absorption of Harvoni, so take them at least 4 hours before or after Harvoni.  -Calcium carbonate (calcium supplements or antacids such as Tums, Caltrate, Os-Cal)needs to be taken  at least 4 hours hours before or after Harvoni.  -St. John's wort or any products that contain St. John's wort like some herbal supplements  Please inform the office prior to starting any of these medications.  - The common side effects associated with Harvoni include:      1. Fatigue      2. Headache      3. Nausea      4. Diarrhea      5. Insomnia  Please note that this only lists the most common side effects and is NOT a comprehensive list of the potential side effects of these medications. For more information, please review the drug information sheets that come with your medication package from the pharmacy.  ---------------------------------------------------------------- GENERAL HELPFUL HINTS ON HCV THERAPY: 1. Stay well-hydrated. 2. Notify the ID Clinic of any changes in your other over-the-counter/herbal or prescription medications. 3. If you miss a dose of your medication, take the missed dose as soon as you remember. Return to your regular time/dose schedule the next day.  4.  Do not stop taking your medications without first talking with your healthcare provider. 5.  You may take Tylenol (acetaminophen), as long as the dose is less than 2000 mg (OR no more than 4 tablets of the Tylenol Extra Strengths 500mg  tablet) in 24 hours. 6.  You will see our pharmacist-specialist within the first 2 weeks of starting your medication to monitor for any possible side effects. 7.  You will have labs once during treatment, after soon after treatment completion and one final lab 6 months after treatment completion to verify the virus is out of your system.  Gardiner Barefootobert W Comer, MD  [facility] [address] [place], [place]  [address] [phone]

## 2017-10-09 NOTE — Telephone Encounter (Signed)
Have sent in hctz.

## 2017-10-09 NOTE — Telephone Encounter (Signed)
Copied from CRM [id number]. Topic: General - Other >> Oct 09, 2017 11:01 AM Cecelia ByarsGreen, Temeka L, RMA wrote: Reason for CRM: Medication refill request for hydrochlorothiazide (HYDRODIURIL) 25 MG [facility]

## 2017-10-09 NOTE — Progress Notes (Signed)
   Subjective:    Patient ID: Marinell BlightSheila Olazabal, female    DOB: [birth year], 72 y.o.   MRN: [id number]  HPI Here for follow up of chronic hepatitis C.  Here work up was completed and she has genotype 1b, elastography with F3/4, but fibrosure just F1-2.  Hepatitis A immune, hepatitis B non-immune.  Ready for treatment.  No associated fatigue. She has remained anxious about the diagnosis.  BP up reflecting that.     Review of Systems  Constitutional: Negative for fatigue.  Skin: Negative for rash.       Objective:   Physical Exam  Constitutional: She appears well-developed and well-nourished. No distress.  HENT:  Mouth/Throat: No oropharyngeal exudate.  Eyes: No scleral icterus.  Cardiovascular: Normal rate, regular rhythm and normal heart sounds.  No murmur heard. Pulmonary/Chest: Effort normal and breath sounds normal. No respiratory distress.  Skin: No rash noted.   SH: no alcohol       Assessment & Plan:

## 2017-10-09 NOTE — Telephone Encounter (Signed)
It would appear that someone has updated her PCP to Dr. Roseanne RenoHassan. Is she now seeing that person?

## 2017-10-09 NOTE — Telephone Encounter (Signed)
Called patient and she states that her PCP has not changed it is still Dr. Okey Duprerawford.

## 2017-10-09 NOTE — Assessment & Plan Note (Signed)
Will start hepatitis B series. Has had pneumococcal vaccine recently and is hepatitis A immune.

## 2017-10-09 NOTE — Telephone Encounter (Signed)
Patient is under stress and her BP has been elevated. She elevation at the clinic and she started the hydrodiuril that she had left- her BP today is 168/83- she/ Dr Luciana Axeomer think she needs to continue. Please refill for patient.

## 2017-10-09 NOTE — Assessment & Plan Note (Signed)
elastography and fibrosure noted. Normal platelets, normal albumin.  This is not c/w cirrhosis.  With these results, referral to GI for varices screening not indicated.  I will though do HCC screening every 6 months.

## 2017-10-13 ENCOUNTER — Other Ambulatory Visit: Payer: Self-pay | Admitting: Pharmacist Clinician (PhC)/ Clinical Pharmacy Specialist

## 2017-10-13 MED ORDER — SOFOSBUVIR-VELPATASVIR 400-100 MG PO TABS: 1.0000 | ORAL_TABLET | Freq: Every day | ORAL | 2 refills | Status: DC

## 2017-10-13 NOTE — Progress Notes (Signed)
Harvoni is not formulary. Will change to Epclusa since it's on there.

## 2017-10-15 ENCOUNTER — Encounter: Payer: Self-pay | Admitting: Pharmacy Technician

## 2017-10-15 MED FILL — SOFOSBUVIR-VELPATASVIR 400-: 400-100 | 28 days supply | Qty: 28 | Fill #0

## 2017-11-06 ENCOUNTER — Ambulatory Visit (INDEPENDENT_AMBULATORY_CARE_PROVIDER_SITE_OTHER): Payer: PPO | Admitting: Pharmacist Clinician (PhC)/ Clinical Pharmacy Specialist

## 2017-11-06 DIAGNOSIS — Z23 Encounter for immunization: Secondary | ICD-10-CM

## 2017-11-06 DIAGNOSIS — B182 Chronic viral hepatitis C: Secondary | ICD-10-CM | POA: Diagnosis not present

## 2017-11-06 LAB — COMPLETE METABOLIC PANEL WITH GFR
AG RATIO: 1.1 (calc) (ref 1.0–2.5)
ALT: 11 U/L (ref 6–29)
AST: 19 U/L (ref 10–35)
Albumin: 4.3 g/dL (ref 3.6–5.1)
Alkaline phosphatase (APISO): 90 U/L (ref 33–130)
BUN: 20 mg/dL (ref 7–25)
CALCIUM: 10.2 mg/dL (ref 8.6–10.4)
CO2: 32 mmol/L (ref 20–32)
CREATININE: 0.76 mg/dL (ref 0.50–0.99)
Chloride: 101 mmol/L (ref 98–110)
GFR, EST AFRICAN AMERICAN: 93 mL/min/{1.73_m2} (ref >=60)
GFR, EST NON AFRICAN AMERICAN: 80 mL/min/{1.73_m2} (ref >=60)
GLOBULIN: 3.8 g/dL — AB (ref 1.9–3.7)
Glucose, Bld: 93 mg/dL (ref 65–99)
POTASSIUM: 4.7 mmol/L (ref 3.5–5.3)
SODIUM: 140 mmol/L (ref 135–146)
TOTAL PROTEIN: 8.1 g/dL (ref 6.1–8.1)
Total Bilirubin: 0.5 mg/dL (ref 0.2–1.2)

## 2017-11-06 NOTE — Progress Notes (Signed)
HPI: Angela Ramirez is a 72 y.o. female who is here for her hep C visit with pharmacy.   Lab Results  Component Value Date   HCVGENOTYPE 1b 09/17/2017    Allergies: Allergies  Allergen Reactions  . Morphine And Related   . Sulfa Antibiotics     Vitals:    Past Medical History: Past Medical History:  Diagnosis Date  . Arnold-Chiari malformation (HCC)   . Arthritis   . Colon polyps   . Headache   . Hypertension   . UTI (urinary tract infection)     Social History: Social History   Socioeconomic History  . Marital status: Widowed    Spouse name: Not on file  . Number of children: Not on file  . Years of education: Not on file  . Highest education level: Not on file  Occupational History  . Not on file  Social Needs  . Financial resource strain: Not on file  . Food insecurity:    Worry: Not on file    Inability: Not on file  . Transportation needs:    Medical: Not on file    Non-medical: Not on file  Tobacco Use  . Smoking status: Former Games developer  . Smokeless tobacco: Never Used  Substance and Sexual Activity  . Alcohol use: No    Alcohol/week: 0.0 oz  . Drug use: No  . Sexual activity: Not on file  Lifestyle  . Physical activity:    Days per week: Not on file    Minutes per session: Not on file  . Stress: Not on file  Relationships  . Social connections:    Talks on phone: Not on file    Gets together: Not on file    Attends religious service: Not on file    Active member of club or organization: Not on file    Attends meetings of clubs or organizations: Not on file    Relationship status: Not on file  Other Topics Concern  . Not on file  Social History Narrative  . Not on file    Labs: Hep B S Ab (no units)  Date Value  09/17/2017 NON-REACTIVE   Hepatitis B Surface Ag (no units)  Date Value  09/17/2017 NON-REACTIVE    Lab Results  Component Value Date   HCVGENOTYPE 1b 09/17/2017    Hepatitis C RNA quantitative Latest Ref Rng & Units  08/18/2017  HCV Quantitative Log NOT DETECT Log IU/mL 6.20(H)    AST (U/L)  Date Value  08/18/2017 31  01/11/2016 37   ALT (U/L)  Date Value  09/17/2017 25  08/18/2017 27  01/11/2016 32   INR (no units)  Date Value  09/17/2017 0.9    CrCl: CrCl cannot be calculated (Patient's most recent lab result is older than the maximum 21 days allowed.).  Fibrosis Score: F3/4 as assessed by [facility]  Child-Pugh Score: Class A  Previous Treatment Regimen: None  Assessment: Anjalina started her Epclusa on 4/26 for her 1b virus. She has not missed a dose but does experience some fatigue. Advised her that it not uncommon for patients to experience that side effects on Epclusa. She had a few episode of headaches but that resolved pretty quickly. Counseled her on the staging of her fibrosis score again. He platelets is greater than 150k. Since she has F3/4, she will f/u with Dr. Luciana Axe for the cure visit. She asked if it's ok for her to exercise again. Advised her that there is no reason that would  prohibit her from exercising.   Will give her the second hep B vaccine today. She can get the 3rd dose at the cure visit.  Recommendations:  Hep C VL, CMP today Continue Epclusa x 12 wks F/u with pharmacy for EOT visit SVR12 the cure with Dr.Comer  Ulyses Southward, Pharm.D., BCPS, AAHIVP Clinical Infectious Disease Pharmacist [facility] 11/06/2017, 9:46 AM

## 2017-11-07 MED FILL — SOFOSBUVIR-VELPATASVIR 400-: 400-100 | 28 days supply | Qty: 28 | Fill #1

## 2017-11-10 LAB — HEPATITIS C RNA QUANTITATIVE
HCV QUANT LOG: 1.56 {Log_IU}/mL — AB
HCV RNA, PCR, QN: 36 [IU]/mL — AB

## 2017-11-12 ENCOUNTER — Telehealth: Payer: Self-pay | Admitting: Pharmacist Clinician (PhC)/ Clinical Pharmacy Specialist

## 2017-11-12 ENCOUNTER — Telehealth: Payer: Self-pay | Admitting: Behavioral Health

## 2017-11-12 NOTE — Telephone Encounter (Signed)
Patient called stating she had lab work done regarding Hep C and would like the results.  Patient states someone would call her with the results. Angeline Slim RN

## 2017-11-12 NOTE — Telephone Encounter (Signed)
Called Adleigh to let her know that her Hep C VL has dropped down to 36. She will f/u with [place] for the EOT visit.

## 2017-11-14 ENCOUNTER — Ambulatory Visit
Admission: RE | Admit: 2017-11-14 | Discharge: 2017-11-14 | Disposition: A | Discharge status: Home / Self Care | Payer: PPO | Source: Ambulatory Visit | Attending: Internal Medicine | Admitting: Internal Medicine

## 2017-11-14 DIAGNOSIS — Z0000 Encounter for general adult medical examination without abnormal findings: Secondary | ICD-10-CM

## 2017-11-18 ENCOUNTER — Other Ambulatory Visit: Payer: Self-pay | Admitting: Internal Medicine

## 2017-11-18 DIAGNOSIS — R234 Changes in skin texture: Secondary | ICD-10-CM

## 2017-11-24 ENCOUNTER — Ambulatory Visit: Payer: PPO

## 2017-11-24 ENCOUNTER — Ambulatory Visit
Admission: RE | Admit: 2017-11-24 | Discharge: 2017-11-24 | Disposition: A | Discharge status: Home / Self Care | Payer: PPO | Source: Ambulatory Visit | Attending: Internal Medicine | Admitting: Internal Medicine

## 2017-11-24 DIAGNOSIS — R928 Other abnormal and inconclusive findings on diagnostic imaging of breast: Secondary | ICD-10-CM | POA: Diagnosis not present

## 2017-11-24 DIAGNOSIS — R234 Changes in skin texture: Secondary | ICD-10-CM

## 2017-12-04 MED FILL — SOFOSBUVIR-VELPATASVIR 400-: 400-100 | 28 days supply | Qty: 28 | Fill #2

## 2018-01-13 ENCOUNTER — Ambulatory Visit (INDEPENDENT_AMBULATORY_CARE_PROVIDER_SITE_OTHER): Payer: PPO | Admitting: Pharmacist

## 2018-01-13 DIAGNOSIS — B182 Chronic viral hepatitis C: Secondary | ICD-10-CM

## 2018-01-13 NOTE — Progress Notes (Signed)
HPI: Angela Ramirez is a 72 y.o. female who presents to the [facility] pharmacy clinic for Hep C follow-up.  Hepatitis C Genotype: 1b  Fibrosis Score: F3/F4 with CP class A  Initial Hepatitis C RNA: 1.5 million  Patient Active Problem List   Diagnosis Date Noted  . Liver fibrosis 10/09/2017  . Vaccine counseling 10/09/2017  . Hep C w/o coma, chronic (HCC) 08/26/2017  . Routine general medical examination at a health care facility 08/18/2017  . Right shoulder pain 08/18/2017  . Essential hypertension 09/21/2015  . Arnold-Chiari malformation (HCC) 09/21/2015  . Insomnia 09/21/2015  . PTSD (post-traumatic stress disorder) 09/21/2015    Patient's Medications  New Prescriptions   No medications on file  Previous Medications   ASPIRIN-ACETAMINOPHEN-CAFFEINE (EXCEDRIN MIGRAINE) 250-250-65 MG TABLET    Take 1 tablet by mouth every 6 (six) hours as needed for headache.   DIPHENHYDRAMINE (SOMINEX) 25 MG TABLET    Take 25 mg by mouth at bedtime as needed for sleep.   HYDROCHLOROTHIAZIDE (HYDRODIURIL) 25 MG TABLET    Take 1 tablet (25 mg total) by mouth daily.   SOFOSBUVIR-VELPATASVIR (EPCLUSA) 400-100 MG TABS    Take 1 tablet by mouth daily.  Modified Medications   No medications on file  Discontinued Medications   No medications on file    Allergies: Allergies  Allergen Reactions  . Morphine And Related   . Sulfa Antibiotics     Past Medical History: Past Medical History:  Diagnosis Date  . Arnold-Chiari malformation (HCC)   . Arthritis   . Colon polyps   . Headache   . Hypertension   . UTI (urinary tract infection)     Social History: Social History   Socioeconomic History  . Marital status: Widowed    Spouse name: Not on file  . Number of children: Not on file  . Years of education: Not on file  . Highest education level: Not on file  Occupational History  . Not on file  Social Needs  . Financial resource strain: Not on file  . Food insecurity:    Worry: Not on  file    Inability: Not on file  . Transportation needs:    Medical: Not on file    Non-medical: Not on file  Tobacco Use  . Smoking status: Former Games developer  . Smokeless tobacco: Never Used  Substance and Sexual Activity  . Alcohol use: No    Alcohol/week: 0.0 oz  . Drug use: No  . Sexual activity: Not on file  Lifestyle  . Physical activity:    Days per week: Not on file    Minutes per session: Not on file  . Stress: Not on file  Relationships  . Social connections:    Talks on phone: Not on file    Gets together: Not on file    Attends religious service: Not on file    Active member of club or organization: Not on file    Attends meetings of clubs or organizations: Not on file    Relationship status: Not on file  Other Topics Concern  . Not on file  Social History Narrative  . Not on file    Labs: Hepatitis C Lab Results  Component Value Date   HCVGENOTYPE 1b 09/17/2017   HEPCAB REACTIVE (A) 08/18/2017   HCVRNAPCRQN 36 (H) 11/06/2017   HCVRNAPCRQN 1,570,000 (H) 08/18/2017   FIBROSTAGE F1-F2 09/17/2017   Hepatitis B Lab Results  Component Value Date   HEPBSAB NON-REACTIVE 09/17/2017  HEPBSAG NON-REACTIVE 09/17/2017   HEPBCAB NON-REACTIVE 09/17/2017   Hepatitis A Lab Results  Component Value Date   HAV REACTIVE (A) 09/17/2017   HIV Lab Results  Component Value Date   HIV NON-REACTIVE 09/17/2017   Lab Results  Component Value Date   CREATININE 0.76 11/06/2017   CREATININE 0.66 08/18/2017   CREATININE 0.66 01/11/2016   Lab Results  Component Value Date   AST 19 11/06/2017   AST 31 08/18/2017   AST 37 01/11/2016   ALT 11 11/06/2017   ALT 25 09/17/2017   ALT 27 08/18/2017   INR 0.9 09/17/2017    Assessment: Angela Ramirez is here today to follow-up for her Hep C infection.  She recently completed 12 weeks of Epclusa in the middle of last week with no issues.  She did have some fatigue during the treatment, but she started taking the Epclusa at night  before bed and it helped tremendously. No missed doses at all. Her initial Hep C viral load was 1.5 million and repeat during therapy was 36. Will repeat today since she is done.   She will get labs done in October and then see Dr. Luciana Axeomer for her cure visit.   Plan: - Completed 12 weeks of Epclusa - Hep C VL today - F/u for SVR12 labs 10/17 at 1030am - F/u with Dr. Luciana Axeomer 10/24 at 1030am  Angela Ramirez L. Angela Ramirez, PharmD, AAHIVP, CPP Infectious Diseases Clinical Pharmacist [facility] 01/13/2018, 2:25 PM   All done, middle of last week. No missed doses.   Tired. Started taking it at night to curb fatigue  3rd hep b comer appt, can start back on herbs

## 2018-01-15 LAB — HEPATITIS C RNA QUANTITATIVE
HCV QUANT LOG: NOT DETECTED {Log_IU}/mL
HCV RNA, PCR, QN: <15 IU/mL

## 2018-01-22 ENCOUNTER — Telehealth: Payer: Self-pay | Admitting: Internal Medicine

## 2018-01-22 NOTE — Telephone Encounter (Signed)
Patient accidentally called wrong office. Patient needed to call infectious disease

## 2018-01-22 NOTE — Telephone Encounter (Signed)
Copied from CRM [id number]. Topic: Quick Communication - See Telephone Encounter >> Jan 22, 2018  9:26 AM Jolayne Hainesaylor, Brittany L wrote: CRM for notification. See Telephone encounter for: 01/22/18.  Patient is calling for her lab results from 7/23. Please call patient back

## 2018-01-23 ENCOUNTER — Telehealth: Payer: Self-pay | Admitting: *Deleted

## 2018-01-23 NOTE — Telephone Encounter (Signed)
Patient called for results, RN relayed. She was pleased. Of note, she has had "withdrawal" type symptoms after completing the medication - fatigue, brain fog, and cramping in her feet. She has been upping her intake of water, orange juice, bananas and zinc. She feels her symptoms are resolving. She just wanted Dr Luciana Axeomer and Wilford Sportsassie to be aware of this after treatment.  She will follow up as scheduled. Andree CossHowell, Michelle M, RN

## 2018-01-23 NOTE — Telephone Encounter (Signed)
Thanks Michelle

## 2018-02-20 ENCOUNTER — Encounter: Payer: Self-pay | Admitting: Internal Medicine

## 2018-02-20 ENCOUNTER — Ambulatory Visit (INDEPENDENT_AMBULATORY_CARE_PROVIDER_SITE_OTHER): Payer: PPO | Admitting: Internal Medicine

## 2018-02-20 VITALS — BP 150/92 | HR 92 | Temp 98.2°F | Ht 66.5 in | Wt 199.0 lb

## 2018-02-20 DIAGNOSIS — B182 Chronic viral hepatitis C: Secondary | ICD-10-CM

## 2018-02-20 DIAGNOSIS — M79644 Pain in right finger(s): Secondary | ICD-10-CM | POA: Insufficient documentation

## 2018-02-20 DIAGNOSIS — M545 Low back pain, unspecified: Secondary | ICD-10-CM

## 2018-02-20 MED ORDER — SILVER SULFADIAZINE 1 % EX CREA: 1.0000 "application " | TOPICAL_CREAM | Freq: Every day | CUTANEOUS | 0 refills | Status: DC

## 2018-02-20 MED ORDER — TRAMADOL HCL 50 MG PO TABS: 50.0000 mg | ORAL_TABLET | Freq: Two times a day (BID) | ORAL | 0 refills | Status: DC | PRN

## 2018-02-20 MED ORDER — BACLOFEN 10 MG PO TABS: 10.0000 mg | ORAL_TABLET | Freq: Three times a day (TID) | ORAL | 0 refills | Status: DC

## 2018-02-20 NOTE — Assessment & Plan Note (Signed)
Will follow up with ID and continue with monitoring of hep c levels. She has finished her medication and feels she is withdrawing from the medicine. We talked about how this will likely fade in the next several weeks.

## 2018-02-20 NOTE — Patient Instructions (Addendum)
We have sent in cream to use on the finger to prevent infection.   We have sent in the muscle relaxer baclofen to use in the evening for the back. We have also sent in tramadol to use if needed.

## 2018-02-20 NOTE — Assessment & Plan Note (Signed)
Rx for silvadene cream. No cellulitis or infection on exam. Call for worsening pain, redness, drainage.

## 2018-02-20 NOTE — Progress Notes (Signed)
   Subjective:    Patient ID: Angela BlightSheila Pherigo, female    DOB: 1945/07/28, 72 y.o.   MRN: [id number]  HPI The patient is a 72 YO female coming in for several concerns including right finger pain (cut it on something about 2-3 days ago, she noticed a cyst kind of thing swelling which then got cut, she is having some redness and pain in the area, overall it is stable or mildly worse, using hydrogen peroxide and antibiotic ointment daily, last tetanus shot 2017 and is up to date, only hurts when she hits it on something), and hep c (has just finished treatment and is glad she did it, having some fatigue from the medicines and having some withdrawal symptoms from being off the medicine for about 2-3 weeks now).   Review of Systems  Constitutional: Positive for fatigue.  HENT: Negative.   Eyes: Negative.   Respiratory: Negative for cough, chest tightness and shortness of breath.   Cardiovascular: Negative for chest pain, palpitations and leg swelling.  Gastrointestinal: Negative for abdominal distention, abdominal pain, constipation, diarrhea, nausea and vomiting.  Musculoskeletal: Negative.   Skin: Positive for color change, rash and wound.  Neurological: Negative.   Psychiatric/Behavioral: Negative.       Objective:   Physical Exam  Constitutional: She is oriented to person, place, and time. She appears well-developed and well-nourished.  HENT:  Head: Normocephalic and atraumatic.  Eyes: EOM are normal.  Neck: Normal range of motion.  Cardiovascular: Normal rate and regular rhythm.  Pulmonary/Chest: Effort normal and breath sounds normal. No respiratory distress. She has no wheezes. She has no rales.  Abdominal: Soft. Bowel sounds are normal. She exhibits no distension. There is no tenderness. There is no rebound.  Musculoskeletal: She exhibits no edema.  Neurological: She is alert and oriented to person, place, and time. Coordination normal.  Skin: Skin is warm and dry. Rash noted.  Wound  on the right index finger, no abscess or purulent drainage, some redness surrounding not cellulitic appearing.   Psychiatric: She has a normal mood and affect.   Vitals:   02/20/18 1300  BP: (!) 150/92  Pulse: 92  Temp: 98.2 F (36.8 C)  TempSrc: Oral  SpO2: 98%  Weight: 199 lb (90.3 kg)  Height: 5' 6.5" (1.689 m)      Assessment & Plan:  Visit time 25 minutes: greater than 50% of that time was spent in face to face counseling and coordination of care with the patient: counseled about hep c treatment and overall prognosis of her liver and with stopping the medicine as well as her fingers.

## 2018-02-20 NOTE — Assessment & Plan Note (Signed)
Rx for tramadol and baclofen for acute low back pain. No red flag symptoms to suggest need for imaging today.

## 2018-02-27 ENCOUNTER — Other Ambulatory Visit: Payer: Self-pay | Admitting: Internal Medicine

## 2018-03-05 ENCOUNTER — Ambulatory Visit: Payer: PPO | Admitting: Internal Medicine

## 2018-03-06 ENCOUNTER — Other Ambulatory Visit: Payer: Self-pay | Admitting: Internal Medicine

## 2018-04-09 ENCOUNTER — Other Ambulatory Visit: Payer: PPO

## 2018-04-09 DIAGNOSIS — B182 Chronic viral hepatitis C: Secondary | ICD-10-CM | POA: Diagnosis not present

## 2018-04-14 LAB — HEPATITIS C RNA QUANTITATIVE
HCV Quantitative Log: <1.18 Log IU/mL
HCV RNA, PCR, QN: <15 IU/mL

## 2018-04-14 LAB — COMPLETE METABOLIC PANEL WITH GFR
AG RATIO: 1.1 (calc) (ref 1.0–2.5)
ALBUMIN MSPROF: 4.4 g/dL (ref 3.6–5.1)
ALKALINE PHOSPHATASE (APISO): 93 U/L (ref 33–130)
ALT: 11 U/L (ref 6–29)
AST: 21 U/L (ref 10–35)
BUN: 13 mg/dL (ref 7–25)
CO2: 28 mmol/L (ref 20–32)
Calcium: 10 mg/dL (ref 8.6–10.4)
Chloride: 98 mmol/L (ref 98–110)
Creat: 0.8 mg/dL (ref 0.50–0.99)
GFR, EST AFRICAN AMERICAN: 87 mL/min/{1.73_m2} (ref >=60)
GFR, Est Non African American: 75 mL/min/{1.73_m2} (ref >=60)
GLUCOSE: 94 mg/dL (ref 65–99)
Globulin: 4 g/dL (calc) — ABNORMAL HIGH (ref 1.9–3.7)
POTASSIUM: 4.5 mmol/L (ref 3.5–5.3)
Sodium: 136 mmol/L (ref 135–146)
TOTAL PROTEIN: 8.4 g/dL — AB (ref 6.1–8.1)
Total Bilirubin: 0.4 mg/dL (ref 0.2–1.2)

## 2018-04-14 LAB — CBC
HCT: 43.1 % (ref 35.0–45.0)
Hemoglobin: 14.8 g/dL (ref 11.7–15.5)
MCH: 29.2 pg (ref 27.0–33.0)
MCHC: 34.3 g/dL (ref 32.0–36.0)
MCV: 85.2 fL (ref 80.0–100.0)
MPV: 10.9 fL (ref 7.5–12.5)
Platelets: 250 10*3/uL (ref 140–400)
RBC: 5.06 10*6/uL (ref 3.80–5.10)
RDW: 13.4 % (ref 11.0–15.0)
WBC: 6.8 10*3/uL (ref 3.8–10.8)

## 2018-04-16 ENCOUNTER — Ambulatory Visit: Payer: PPO | Admitting: Internal Medicine

## 2018-04-21 ENCOUNTER — Encounter: Payer: Self-pay | Admitting: Internal Medicine

## 2018-04-21 ENCOUNTER — Ambulatory Visit (INDEPENDENT_AMBULATORY_CARE_PROVIDER_SITE_OTHER): Payer: PPO | Admitting: Internal Medicine

## 2018-04-21 VITALS — BP 167/102 | HR 96 | Temp 98.7°F | Wt 200.4 lb

## 2018-04-21 DIAGNOSIS — Z23 Encounter for immunization: Secondary | ICD-10-CM

## 2018-04-21 DIAGNOSIS — K7400 Hepatic fibrosis, unspecified: Secondary | ICD-10-CM

## 2018-04-21 DIAGNOSIS — B182 Chronic viral hepatitis C: Secondary | ICD-10-CM | POA: Diagnosis not present

## 2018-04-21 NOTE — Progress Notes (Signed)
   Subjective:    Patient ID: Angela Ramirez, female    DOB: Jul 13, 1945, 72 y.o.   MRN: [id number]  HPI Here for follow up of chronic hepatitis C.  Treated with Epclusa for 12 weeks and completed treatment 3 months ago and here for SVR12.  Results are negative confirming cure.  Pleased with results.   Also with F3/4 on elastography but no cirrhosis, CP A.  Due for hepatitis B #3.     Review of Systems  Constitutional: Negative for fatigue.  Gastrointestinal: Negative for diarrhea and nausea.  Neurological: Negative for dizziness.       Objective:   Physical Exam  Constitutional: She appears well-developed and well-nourished. No distress.  HENT:  Mouth/Throat: No oropharyngeal exudate.  Eyes: No scleral icterus.  Cardiovascular: Normal rate, regular rhythm and normal heart sounds.  Pulmonary/Chest: Effort normal. No respiratory distress.  Skin: No rash noted.   SH: no alcohol       Assessment & Plan:

## 2018-04-21 NOTE — Assessment & Plan Note (Signed)
Will get #3 today.

## 2018-04-21 NOTE — Assessment & Plan Note (Signed)
Now considered cured.  

## 2018-04-21 NOTE — Assessment & Plan Note (Signed)
With advanced fibrosis, I will continue HCC screening twice a year.  She will follow up in 1 year.

## 2018-05-07 ENCOUNTER — Telehealth: Payer: Self-pay

## 2018-05-07 ENCOUNTER — Ambulatory Visit (INDEPENDENT_AMBULATORY_CARE_PROVIDER_SITE_OTHER): Payer: PPO | Admitting: Internal Medicine

## 2018-05-07 ENCOUNTER — Encounter: Payer: Self-pay | Admitting: Internal Medicine

## 2018-05-07 VITALS — BP 144/90 | HR 93 | Temp 98.0°F | Ht 66.5 in | Wt 195.0 lb

## 2018-05-07 DIAGNOSIS — Z23 Encounter for immunization: Secondary | ICD-10-CM | POA: Diagnosis not present

## 2018-05-07 DIAGNOSIS — F5104 Psychophysiologic insomnia: Secondary | ICD-10-CM

## 2018-05-07 DIAGNOSIS — Z7184 Encounter for health counseling related to travel: Secondary | ICD-10-CM | POA: Diagnosis not present

## 2018-05-07 DIAGNOSIS — I10 Essential (primary) hypertension: Secondary | ICD-10-CM | POA: Diagnosis not present

## 2018-05-07 DIAGNOSIS — M79644 Pain in right finger(s): Secondary | ICD-10-CM

## 2018-05-07 MED ORDER — CIPROFLOXACIN HCL 500 MG PO TABS: 500.0000 mg | ORAL_TABLET | Freq: Two times a day (BID) | ORAL | 0 refills | Status: DC

## 2018-05-07 MED ORDER — TYPHOID VACCINE PO CPDR: 1.0000 | DELAYED_RELEASE_CAPSULE | ORAL | 0 refills | Status: DC

## 2018-05-07 MED ORDER — LEVALBUTEROL TARTRATE 45 MCG/ACT IN AERO: 1.0000 | INHALATION_SPRAY | RESPIRATORY_TRACT | 12 refills | Status: DC | PRN

## 2018-05-07 MED ORDER — SUVOREXANT 10 MG PO TABS: 10.0000 mg | ORAL_TABLET | Freq: Every day | ORAL | 5 refills | Status: DC

## 2018-05-07 MED ORDER — HYDROCHLOROTHIAZIDE 12.5 MG PO TABS: 12.5000 mg | ORAL_TABLET | Freq: Every day | ORAL | 3 refills | Status: DC

## 2018-05-07 MED ORDER — ATOVAQUONE-PROGUANIL HCL 62.5-25 MG PO TABS: 1.0000 | ORAL_TABLET | Freq: Every day | ORAL | 0 refills | Status: DC

## 2018-05-07 NOTE — Telephone Encounter (Signed)
Since I did not look at them I am not sure exactly what these are. Is she wanting to see a dermatologist to get them off? If they are cysts then depending on location a hand specialist may be more appropriate.

## 2018-05-07 NOTE — Progress Notes (Signed)
   Subjective:    Patient ID: Angela BlightSheila Crisanto, female    DOB: 1946/03/21, 72 y.o.   MRN: [id number]  HPI The patient is a 72 YO female coming in for several concerns including upcoming travel to [place] (going for wedding and staying in [place] and unsure of travel plans within the country, has had hep b vaccine, will be eating at the wedding, staying at a hotel), and blood pressure (taking hctz 12.5 mg daily and wants refill, has not taken yet today, denies headaches or chest pains), and sleeping problems (was doing okay with belsomra and is wanting to get back on that, denies depression or anxiety but feels like her emotions are not as stable when not sleeping well, denies SI/HI).   Review of Systems  Constitutional: Negative.   HENT: Negative.   Eyes: Negative.   Respiratory: Negative for cough, chest tightness and shortness of breath.   Cardiovascular: Negative for chest pain, palpitations and leg swelling.  Gastrointestinal: Negative for abdominal distention, abdominal pain, constipation, diarrhea, nausea and vomiting.  Musculoskeletal: Negative.   Skin: Negative.   Neurological: Negative.   Psychiatric/Behavioral: Positive for sleep disturbance. Negative for behavioral problems, decreased concentration, dysphoric mood, self-injury and suicidal ideas. The patient is not nervous/anxious.       Objective:   Physical Exam  Constitutional: She is oriented to person, place, and time. She appears well-developed and well-nourished.  HENT:  Head: Normocephalic and atraumatic.  Eyes: EOM are normal.  Neck: Normal range of motion.  Cardiovascular: Normal rate and regular rhythm.  Pulmonary/Chest: Effort normal and breath sounds normal. No respiratory distress. She has no wheezes. She has no rales.  Abdominal: Soft. Bowel sounds are normal. She exhibits no distension. There is no tenderness. There is no rebound.  Musculoskeletal: She exhibits no edema.  Neurological: She is alert and oriented to  person, place, and time. Coordination normal.  Skin: Skin is warm and dry.  Psychiatric: She has a normal mood and affect.   Vitals:   05/07/18 0848 05/07/18 0958  BP: (!) 170/100 (!) 144/90  Pulse: 93   Temp: 98 F (36.7 C)   TempSrc: Oral   SpO2: 97%   Weight: 195 lb (88.5 kg)   Height: 5' 6.5" (1.689 m)       Assessment & Plan:  Flu, pneumonia 23, and hep A given at visit

## 2018-05-07 NOTE — Assessment & Plan Note (Signed)
Counseled about need for vaccine to hep A (given today 1st), typhoid (sent in vaccine), traveler's diarrhea (rx for cipro), malaria prevention (rx sent in today to start 2 days prior to travel and take 1 week after return). Counseled about rabies in local animals. Given travel information on http://www.[place].info/cdc.gov/travel.

## 2018-05-07 NOTE — Telephone Encounter (Signed)
Referral placed.

## 2018-05-07 NOTE — Telephone Encounter (Signed)
Before patient left stated that she forgot to ask for the referral to get the two growths on her index fingers removed and is wondering if she can get that placed.

## 2018-05-07 NOTE — Telephone Encounter (Signed)
Patient states that she was told she could get them frozen off at a dermatologist. States that her last visit here she was seen for them and they are on the end knuckle on both index fingers

## 2018-05-07 NOTE — Patient Instructions (Addendum)
We have sent in the malaria medicine called atovaquone/proguanil to take 1 pill daily starting 2 DAYS prior to leaving and continuing through the trip and for 7 DAYS after getting home.   We have sent in ciprofloxacin which is for traveler's diarrhea to fill and take with you. If you get diarrhea on trip start taking ciprofloxacin 1 pill twice a day for 5 days.   We have sent in typhoid vaccine which is a pill to take. It is 1 pill every other day for 4 doses and must be finished at least 1 week prior to travel to be effective.  We have sent in inhaler to take on trip if needed. Go to http://www.[place].info/cdc.gov/travel to look for packing list for [place] to make sure you are taking what you need.   We have sent in refill and sleep medicine.

## 2018-05-07 NOTE — Addendum Note (Signed)
Addended by: Hillard DankerRAWFORD, Ellorie Kindall A on: 05/07/2018 03:08 PM   Modules accepted: Orders

## 2018-05-07 NOTE — Assessment & Plan Note (Signed)
Rx for hctz 12.5 mg daily and BP close to goal today. Recent CMP okay.

## 2018-05-07 NOTE — Assessment & Plan Note (Signed)
Rx for belsomra 10 mg daily to use for sleeping which she has taken in the past without side effects.

## 2018-05-25 ENCOUNTER — Ambulatory Visit
Admission: RE | Admit: 2018-05-25 | Discharge: 2018-05-25 | Disposition: A | Discharge status: Home / Self Care | Payer: PPO | Source: Ambulatory Visit | Attending: Internal Medicine | Admitting: Internal Medicine

## 2018-05-25 DIAGNOSIS — K7400 Hepatic fibrosis, unspecified: Secondary | ICD-10-CM

## 2018-05-25 DIAGNOSIS — B1920 Unspecified viral hepatitis C without hepatic coma: Secondary | ICD-10-CM | POA: Diagnosis not present

## 2018-11-26 ENCOUNTER — Other Ambulatory Visit: Payer: PPO

## 2018-12-09 ENCOUNTER — Ambulatory Visit
Admission: RE | Admit: 2018-12-09 | Discharge: 2018-12-09 | Disposition: A | Discharge status: Home / Self Care | Payer: PPO | Source: Ambulatory Visit | Attending: Internal Medicine | Admitting: Internal Medicine

## 2018-12-09 DIAGNOSIS — K7460 Unspecified cirrhosis of liver: Secondary | ICD-10-CM | POA: Diagnosis not present

## 2018-12-09 DIAGNOSIS — K7400 Hepatic fibrosis, unspecified: Secondary | ICD-10-CM

## 2018-12-10 ENCOUNTER — Other Ambulatory Visit: Payer: Self-pay | Admitting: Internal Medicine

## 2018-12-10 NOTE — Telephone Encounter (Signed)
Control database checked last refill:11/04/2018  LOV: 05/07/2018 OLM:BEML

## 2019-01-21 ENCOUNTER — Other Ambulatory Visit: Payer: Self-pay

## 2019-02-18 ENCOUNTER — Ambulatory Visit (INDEPENDENT_AMBULATORY_CARE_PROVIDER_SITE_OTHER): Payer: PPO | Admitting: Internal Medicine

## 2019-02-18 ENCOUNTER — Encounter: Payer: Self-pay | Admitting: Internal Medicine

## 2019-02-18 ENCOUNTER — Other Ambulatory Visit (INDEPENDENT_AMBULATORY_CARE_PROVIDER_SITE_OTHER): Payer: PPO

## 2019-02-18 ENCOUNTER — Other Ambulatory Visit: Payer: Self-pay

## 2019-02-18 VITALS — BP 150/80 | HR 110 | Temp 98.3°F | Ht 66.5 in | Wt 196.0 lb

## 2019-02-18 DIAGNOSIS — I10 Essential (primary) hypertension: Secondary | ICD-10-CM | POA: Diagnosis not present

## 2019-02-18 DIAGNOSIS — K219 Gastro-esophageal reflux disease without esophagitis: Secondary | ICD-10-CM

## 2019-02-18 DIAGNOSIS — F5104 Psychophysiologic insomnia: Secondary | ICD-10-CM | POA: Diagnosis not present

## 2019-02-18 DIAGNOSIS — Z23 Encounter for immunization: Secondary | ICD-10-CM

## 2019-02-18 LAB — COMPREHENSIVE METABOLIC PANEL
ALT: 12 U/L (ref 0–35)
AST: 23 U/L (ref 0–37)
Albumin: 4.6 g/dL (ref 3.5–5.2)
Alkaline Phosphatase: 96 U/L (ref 39–117)
BUN: 17 mg/dL (ref 6–23)
CO2: 30 mEq/L (ref 19–32)
Calcium: 10.2 mg/dL (ref 8.4–10.5)
Chloride: 98 mEq/L (ref 96–112)
Creatinine, Ser: 0.85 mg/dL (ref 0.40–1.20)
GFR: 65.98 mL/min (ref >60.00)
Glucose, Bld: 115 mg/dL — ABNORMAL HIGH (ref 70–99)
Potassium: 4 mEq/L (ref 3.5–5.1)
Sodium: 135 mEq/L (ref 135–145)
Total Bilirubin: 0.4 mg/dL (ref 0.2–1.2)
Total Protein: 9 g/dL — ABNORMAL HIGH (ref 6.0–8.3)

## 2019-02-18 LAB — CBC
HCT: 42.3 % (ref 36.0–46.0)
Hemoglobin: 14.1 g/dL (ref 12.0–15.0)
MCHC: 33.3 g/dL (ref 30.0–36.0)
MCV: 89.4 fl (ref 78.0–100.0)
Platelets: 279 10*3/uL (ref 150.0–400.0)
RBC: 4.73 Mil/uL (ref 3.87–5.11)
RDW: 14.7 % (ref 11.5–15.5)
WBC: 6.3 10*3/uL (ref 4.0–10.5)

## 2019-02-18 LAB — LIPID PANEL
Cholesterol: 199 mg/dL (ref 0–200)
HDL: 68.9 mg/dL (ref >39.00)
LDL Cholesterol: 119 mg/dL — ABNORMAL HIGH (ref 0–99)
NonHDL: 130.17
Total CHOL/HDL Ratio: 3
Triglycerides: 58 mg/dL (ref 0.0–149.0)
VLDL: 11.6 mg/dL (ref 0.0–40.0)

## 2019-02-18 MED ORDER — TEMAZEPAM 7.5 MG PO CAPS: 7.5000 mg | ORAL_CAPSULE | Freq: Every evening | ORAL | 0 refills | Status: DC | PRN

## 2019-02-18 NOTE — Progress Notes (Signed)
   Subjective:   Patient ID: Angela Ramirez, female    DOB: 14-May-1946, 73 y.o.   MRN: [id number]  HPI The patient is a 73 YO female coming in for concerns about chest tightness (new in the last several weeks, denies SOB, denies sweating or left arm pain, no recent EKG and has high blood pressure, this is well controlled at home, denies headaches), GERD (not taking anything recently for this, is concerned about long term medications and worries about her liver especially since she did have hepatitis C which is now cured) and sleep (used to take temazepam long ago, she got away from this as she was worried about the long term side effects, tried belsomra but this did not help well, she wants to try again, she is not sleeping well due to pandemic and this is causing her poor QOL).   Review of Systems  Constitutional: Negative.   HENT: Negative.   Eyes: Negative.   Respiratory: Positive for chest tightness. Negative for cough and shortness of breath.   Cardiovascular: Positive for chest pain. Negative for palpitations and leg swelling.  Gastrointestinal: Negative for abdominal distention, abdominal pain, constipation, diarrhea, nausea and vomiting.       GERD  Musculoskeletal: Negative.   Skin: Negative.   Neurological: Negative.   Psychiatric/Behavioral: Positive for decreased concentration and sleep disturbance.    Objective:  Physical Exam Constitutional:      Appearance: She is well-developed.  HENT:     Head: Normocephalic and atraumatic.  Neck:     Musculoskeletal: Normal range of motion.  Cardiovascular:     Rate and Rhythm: Normal rate and regular rhythm.  Pulmonary:     Effort: Pulmonary effort is normal. No respiratory distress.     Breath sounds: Normal breath sounds. No wheezing or rales.  Abdominal:     General: Bowel sounds are normal. There is no distension.     Palpations: Abdomen is soft.     Tenderness: There is no abdominal tenderness. There is no rebound.  Skin:     General: Skin is warm and dry.  Neurological:     Mental Status: She is alert and oriented to person, place, and time.     Coordination: Coordination normal.     Vitals:   02/18/19 1055 02/18/19 1131  BP: (!) 160/80 (!) 150/80  Pulse: (!) 110   Temp: 98.3 F (36.8 C)   TempSrc: Oral   SpO2: 98%   Weight: 196 lb (88.9 kg)   Height: 5' 6.5" (1.689 m)    EKG: Rate 103, axis normal, interval normal, sinus tachy, potential old anterior infarct, no st or t wave acute changes, no prior to compare in epic  Assessment & Plan:  Flu shot given at visit

## 2019-02-18 NOTE — Patient Instructions (Addendum)
Pepcid is a good one to try for acid reflux.   We have sent in the temazepam to try again as needed for sleep.   We have done the EKG today which is not changed.   We are checking labs since you are here today.

## 2019-02-19 ENCOUNTER — Other Ambulatory Visit (INDEPENDENT_AMBULATORY_CARE_PROVIDER_SITE_OTHER): Payer: PPO

## 2019-02-19 ENCOUNTER — Other Ambulatory Visit: Payer: Self-pay | Admitting: Internal Medicine

## 2019-02-19 DIAGNOSIS — I10 Essential (primary) hypertension: Secondary | ICD-10-CM

## 2019-02-19 DIAGNOSIS — K219 Gastro-esophageal reflux disease without esophagitis: Secondary | ICD-10-CM | POA: Insufficient documentation

## 2019-02-19 LAB — HEMOGLOBIN A1C: Hgb A1c MFr Bld: 5.9 % (ref 4.6–6.5)

## 2019-02-19 NOTE — Assessment & Plan Note (Signed)
She will try pepcid which is safe for long term or intermittent usage. She will call if not improved.

## 2019-02-19 NOTE — Assessment & Plan Note (Signed)
Rx for restoril, checked [place] narcotic database which is appropriate. Counseled about risk/benefit and she wishes to proceed with occasional use.

## 2019-02-19 NOTE — Assessment & Plan Note (Signed)
EKG done due to chest tightness and high BP which is not changed. Advised her to watch BP at home and if high call office back and may need increase in meds.

## 2019-07-16 ENCOUNTER — Ambulatory Visit: Payer: PPO | Attending: Internal Medicine

## 2019-07-16 DIAGNOSIS — Z23 Encounter for immunization: Secondary | ICD-10-CM | POA: Insufficient documentation

## 2019-07-16 NOTE — Progress Notes (Signed)
   Covid-19 Vaccination Clinic  Name:  Anairis Knick    MRN: [id number] DOB: 1946-04-20  07/16/2019  Ms. Burcher was observed post Covid-19 immunization for 15 minutes without incidence. She was provided with Vaccine Information Sheet and instruction to access the V-Safe system.   Ms. Traore was instructed to call 911 with any severe reactions post vaccine: Marland Kitchen Difficulty breathing  . Swelling of your face and throat  . A fast heartbeat  . A bad rash all over your body  . Dizziness and weakness    Immunizations Administered    Name Date Dose VIS Date Route   Pfizer COVID-19 Vaccine 07/16/2019 10:49 AM 0.3 mL 06/04/2019 Intramuscular   Manufacturer: [employer], [employer]   Lot: [id number]   NDC: [address]-5

## 2019-08-06 ENCOUNTER — Ambulatory Visit: Payer: PPO | Attending: Internal Medicine

## 2019-08-06 DIAGNOSIS — Z23 Encounter for immunization: Secondary | ICD-10-CM | POA: Insufficient documentation

## 2019-08-06 NOTE — Progress Notes (Signed)
   Covid-19 Vaccination Clinic  Name:  Angela Ramirez    MRN: [id number] DOB: Oct 05, 1945  08/06/2019  [name] was observed post Covid-19 immunization for 15 minutes without incidence. She was provided with Vaccine Information Sheet and instruction to access the V-Safe system.   [name] was instructed to call 911 with any severe reactions post vaccine: Marland Kitchen Difficulty breathing  . Swelling of your face and throat  . A fast heartbeat  . A bad rash all over your body  . Dizziness and weakness    Immunizations Administered    Name Date Dose VIS Date Route   Pfizer COVID-19 Vaccine 08/06/2019  2:08 PM 0.3 mL 06/04/2019 Intramuscular   Manufacturer: [employer], [employer]   Lot: [id number]   NDC: [address]-1

## 2019-08-20 ENCOUNTER — Encounter: Payer: Self-pay | Admitting: Internal Medicine

## 2019-08-20 ENCOUNTER — Ambulatory Visit (INDEPENDENT_AMBULATORY_CARE_PROVIDER_SITE_OTHER): Payer: PPO | Admitting: Internal Medicine

## 2019-08-20 ENCOUNTER — Other Ambulatory Visit: Payer: Self-pay

## 2019-08-20 VITALS — BP 144/88 | HR 97 | Temp 98.0°F | Ht 66.5 in | Wt 198.0 lb

## 2019-08-20 DIAGNOSIS — B182 Chronic viral hepatitis C: Secondary | ICD-10-CM | POA: Diagnosis not present

## 2019-08-20 DIAGNOSIS — K5900 Constipation, unspecified: Secondary | ICD-10-CM

## 2019-08-20 LAB — COMPREHENSIVE METABOLIC PANEL
ALT: 11 U/L (ref 0–35)
AST: 20 U/L (ref 0–37)
Albumin: 4.5 g/dL (ref 3.5–5.2)
Alkaline Phosphatase: 93 U/L (ref 39–117)
BUN: 20 mg/dL (ref 6–23)
CO2: 32 mEq/L (ref 19–32)
Calcium: 10.4 mg/dL (ref 8.4–10.5)
Chloride: 102 mEq/L (ref 96–112)
Creatinine, Ser: 0.76 mg/dL (ref 0.40–1.20)
GFR: 74.97 mL/min (ref >60.00)
Glucose, Bld: 99 mg/dL (ref 70–99)
Potassium: 4.1 mEq/L (ref 3.5–5.1)
Sodium: 139 mEq/L (ref 135–145)
Total Bilirubin: 0.5 mg/dL (ref 0.2–1.2)
Total Protein: 9.1 g/dL — ABNORMAL HIGH (ref 6.0–8.3)

## 2019-08-20 LAB — LIPASE: Lipase: 24 U/L (ref 11.0–59.0)

## 2019-08-20 LAB — CBC
HCT: 41.9 % (ref 36.0–46.0)
Hemoglobin: 14 g/dL (ref 12.0–15.0)
MCHC: 33.4 g/dL (ref 30.0–36.0)
MCV: 89.9 fl (ref 78.0–100.0)
Platelets: 269 10*3/uL (ref 150.0–400.0)
RBC: 4.66 Mil/uL (ref 3.87–5.11)
RDW: 14.3 % (ref 11.5–15.5)
WBC: 6.2 10*3/uL (ref 4.0–10.5)

## 2019-08-20 NOTE — Patient Instructions (Addendum)
We will get you in with GI.  I would recommend to try IB guard over the counter to help with the irritation.   Something to try over the counter for constipation is senokot-d which is safe to take when needed.

## 2019-08-20 NOTE — Assessment & Plan Note (Signed)
Referral to GI in case issues do not resolve. Advised to try ibguard and senokot-s for constipation. Okay to resume greens and fiber gradually.

## 2019-08-20 NOTE — Progress Notes (Signed)
   Subjective:   Patient ID: Angela Ramirez, female    DOB: 02/18/1946, 74 y.o.   MRN: [id number]  HPI The patient is a 74 YO female coming in for concerns about her stomach and constipation. She has changed diet to more clean and more spinach and vegetables in the last several months. She then changed to a mixed green and this bothered her stomach. She is now constipated and having to use laxatives to go to the bathroom. Went last in the last 2-3 days. Denies nausea or vomiting. Still passing gas normally. She switched to eating mostly carbohydrates as this helped her stomach initially after it got bad. She is concerned given history of hep c. Last colonoscopy 2012 and almost due for repeat.   Review of Systems  Constitutional: Negative.   HENT: Negative.   Eyes: Negative.   Respiratory: Negative for cough, chest tightness and shortness of breath.   Cardiovascular: Negative for chest pain, palpitations and leg swelling.  Gastrointestinal: Positive for abdominal distention, abdominal pain and constipation. Negative for anal bleeding, blood in stool, diarrhea, nausea, rectal pain and vomiting.  Musculoskeletal: Negative.   Skin: Negative.   Neurological: Negative.   Psychiatric/Behavioral: Negative.     Objective:  Physical Exam Constitutional:      Appearance: She is well-developed.  HENT:     Head: Normocephalic and atraumatic.  Cardiovascular:     Rate and Rhythm: Normal rate and regular rhythm.  Pulmonary:     Effort: Pulmonary effort is normal. No respiratory distress.     Breath sounds: Normal breath sounds. No wheezing or rales.  Abdominal:     General: Bowel sounds are normal. There is no distension.     Palpations: Abdomen is soft.     Tenderness: There is no abdominal tenderness. There is no rebound.  Musculoskeletal:     Cervical back: Normal range of motion.  Skin:    General: Skin is warm and dry.  Neurological:     Mental Status: She is alert and oriented to  person, place, and time.     Coordination: Coordination normal.     Vitals:   08/20/19 1019  BP: (!) 144/88  Pulse: 97  Temp: 98 F (36.7 C)  TempSrc: Oral  SpO2: 97%  Weight: 198 lb (89.8 kg)  Height: 5' 6.5" (1.689 m)    This visit occurred during the SARS-CoV-2 public health emergency.  Safety protocols were in place, including screening questions prior to the visit, additional usage of staff PPE, and extensive cleaning of exam room while observing appropriate contact time as indicated for disinfecting solutions.   Assessment & Plan:

## 2019-08-20 NOTE — Assessment & Plan Note (Signed)
Checking CBC and CMP today to ensure stability.  

## 2019-09-22 ENCOUNTER — Encounter: Payer: Self-pay | Admitting: *Deleted

## 2019-09-30 ENCOUNTER — Ambulatory Visit: Payer: PPO | Admitting: Internal Medicine

## 2019-09-30 ENCOUNTER — Encounter: Payer: Self-pay | Admitting: Internal Medicine

## 2019-09-30 ENCOUNTER — Other Ambulatory Visit (INDEPENDENT_AMBULATORY_CARE_PROVIDER_SITE_OTHER): Payer: PPO

## 2019-09-30 VITALS — BP 200/110 | HR 84 | Temp 98.6°F | Ht 66.0 in | Wt 201.0 lb

## 2019-09-30 DIAGNOSIS — K7460 Unspecified cirrhosis of liver: Secondary | ICD-10-CM

## 2019-09-30 DIAGNOSIS — Z8619 Personal history of other infectious and parasitic diseases: Secondary | ICD-10-CM | POA: Diagnosis not present

## 2019-09-30 DIAGNOSIS — R140 Abdominal distension (gaseous): Secondary | ICD-10-CM | POA: Diagnosis not present

## 2019-09-30 DIAGNOSIS — K5900 Constipation, unspecified: Secondary | ICD-10-CM

## 2019-09-30 DIAGNOSIS — R1032 Left lower quadrant pain: Secondary | ICD-10-CM

## 2019-09-30 LAB — COMPREHENSIVE METABOLIC PANEL
ALT: 10 U/L (ref 0–35)
AST: 19 U/L (ref 0–37)
Albumin: 4.3 g/dL (ref 3.5–5.2)
Alkaline Phosphatase: 95 U/L (ref 39–117)
BUN: 13 mg/dL (ref 6–23)
CO2: 28 mEq/L (ref 19–32)
Calcium: 9.8 mg/dL (ref 8.4–10.5)
Chloride: 102 mEq/L (ref 96–112)
Creatinine, Ser: 0.73 mg/dL (ref 0.40–1.20)
GFR: 78.51 mL/min (ref >60.00)
Glucose, Bld: 93 mg/dL (ref 70–99)
Potassium: 4.2 mEq/L (ref 3.5–5.1)
Sodium: 137 mEq/L (ref 135–145)
Total Bilirubin: 0.4 mg/dL (ref 0.2–1.2)
Total Protein: 8.4 g/dL — ABNORMAL HIGH (ref 6.0–8.3)

## 2019-09-30 LAB — PROTIME-INR
INR: 1 ratio (ref 0.8–1.0)
Prothrombin Time: 11.5 s (ref 9.6–13.1)

## 2019-09-30 MED ORDER — SUPREP BOWEL PREP KIT 17.5-3.13-1.6 GM/177ML PO SOLN: 1.0000 | ORAL | 0 refills | Status: DC

## 2019-09-30 MED ORDER — LINACLOTIDE 72 MCG PO CAPS: 72.0000 ug | ORAL_CAPSULE | Freq: Every day | ORAL | 3 refills | Status: DC

## 2019-09-30 NOTE — Progress Notes (Signed)
Patient ID: Angela Ramirez, female   DOB: 1946/01/08, 74 y.o.   MRN: [id number] HPI: Angela Ramirez is a 74 year old female with a past medical history of hepatitis C (status post treatment with SVR) cirrhosis, history of constipation, IBS, history of colon polyps, diverticulosis who is seen in consult at the request of Dr. Sharlet Salina to evaluate constipation, abdominal pain and history of cirrhosis.  She is here today with her daughter.  Her prior GI care was in [place].  She has had 2 colonoscopies with the last being in 2012.  She reports having 6 polyps removed on her initial colonoscopy in the second colonoscopy was "normal".  Recently she has been dealing with constipation.  She also is having left-sided predominantly left lower abdominal soreness and tenderness.  She feels this pain and pressure when "stool is moving through that area".  There was a period of a few weeks where her entire abdomen and intestines felt inflamed being overly sensitive and burning discomfort.  She has been eating a very bland diet which has been helpful.  She also has bloating and abdominal fullness worse with heavy foods.  She is used IBgard which she is found quite helpful.  She is using an over-the-counter laxative which is also helped at times.  She is use this more as needed.  At times stools are large and difficult to pass.  She will occasionally see blood with a large or hard stool.  Previously she could eat a high-fiber vegetable diet such as spinach and bowel movements would improve that this is been less of the case lately.  She is not having heartburn or dysphagia.  No nausea or vomiting good appetite.  From a liver perspective she does have a history of hepatitis C.  She drinks wine very infrequently maybe 2 glasses of wine per week.  Her hepatitis C was treated by Dr. Linus Salmons this was approximately 2 years ago.  She has been having intermittent imaging and her last ultrasound which I reviewed with 12/09/2018.  This showed  echogenic liver and contour abnormality consistent with cirrhosis with no focal hepatic lesions.  There is no ductal dilatation.  Portal vein was in the normal direction.  Normal gallbladder and bile duct.  No family history of liver disease.  Her mother had diabetes, kidney disease and irritable bowel.  Past Medical History:  Diagnosis Date  . Anxiety   . Arnold-Chiari malformation ([place])   . Arthritis   . Cirrhosis ([place])   . Colon polyps   . Depression   . Diverticulitis   . Diverticulosis   . Headache   . Hepatitis C   . Hypertension   . IBS (irritable bowel syndrome)   . UTI (urinary tract infection)     Past Surgical History:  Procedure Laterality Date  . BRAIN SURGERY  2003   decompression  . BREAST EXCISIONAL BIOPSY Bilateral   . DILATION AND CURETTAGE OF UTERUS    . OVARIAN CYST REMOVAL Bilateral   . REDUCTION MAMMAPLASTY Bilateral     Outpatient Medications Prior to Visit  Medication Sig Dispense Refill  . aspirin-acetaminophen-caffeine (EXCEDRIN MIGRAINE) 250-250-65 MG tablet Take 1 tablet by mouth every 6 (six) hours as needed for headache.    . diphenhydrAMINE (SOMINEX) 25 MG tablet Take 25 mg by mouth at bedtime as needed for sleep.    . hydrochlorothiazide (HYDRODIURIL) 12.5 MG tablet Take 1 tablet (12.5 mg total) by mouth daily. 90 tablet 3  . levalbuterol (XOPENEX HFA) 45 MCG/ACT inhaler  Inhale 1-2 puffs into the lungs every 4 (four) hours as needed for wheezing. 1 Inhaler 12  . silver sulfADIAZINE (SILVADENE) 1 % cream Apply 1 application topically daily. 50 g 0  . temazepam (RESTORIL) 7.5 MG capsule Take 1 capsule (7.5 mg total) by mouth at bedtime as needed for sleep. 30 capsule 0   No facility-administered medications prior to visit.    Allergies  Allergen Reactions  . Morphine And Related Nausea And Vomiting  . Sulfa Antibiotics Nausea And Vomiting    Family History  Problem Relation Age of Onset  . Arthritis Mother   . Hypertension Mother   .  Kidney disease Mother   . Diabetes Mother   . Alcohol abuse Father   . Arthritis Father   . Lung cancer Father   . Hypertension Sister   . Diabetes Maternal Grandmother   . Breast cancer Paternal Grandmother   . Parkinson's disease Son   . Hypertension Daughter   . Diabetes Maternal Aunt   . Kidney failure Maternal Aunt   . Diabetes Maternal Uncle   . Rheum arthritis Sister     Social History   Tobacco Use  . Smoking status: Former Smoker    Types: Cigarettes    Quit date: 09/29/1969    Years since quitting: 50.0  . Smokeless tobacco: Never Used  Substance Use Topics  . Alcohol use: Yes    Alcohol/week: 0.0 standard drinks    Comment: 2 glasses of wine a week  . Drug use: No    ROS: As per history of present illness, otherwise negative  BP (!) 200/110 (BP Location: Left Arm, Patient Position: Sitting, Cuff Size: Normal)   Pulse 84   Temp 98.6 F (37 C)   Ht 5\' 6"  (1.676 m) Comment: height measured without shoes  Wt 201 lb (91.2 kg)   BMI 32.44 kg/m  Gen: awake, alert, NAD HEENT: anicteric CV: RRR, no mrg Pulm: CTA b/l Abd: soft, NT/ND, +BS throughout, no HSM Ext: no c/c/e Neuro: nonfocal   RELEVANT LABS AND IMAGING: CBC    Component Value Date/Time   WBC 6.2 08/20/2019 1103   RBC 4.66 08/20/2019 1103   HGB 14.0 08/20/2019 1103   HCT 41.9 08/20/2019 1103   PLT 269.0 08/20/2019 1103   MCV 89.9 08/20/2019 1103   MCH 29.2 04/09/2018 1011   MCHC 33.4 08/20/2019 1103   RDW 14.3 08/20/2019 1103    CMP     Component Value Date/Time   NA 137 09/30/2019 1503   K 4.2 09/30/2019 1503   CL 102 09/30/2019 1503   CO2 28 09/30/2019 1503   GLUCOSE 93 09/30/2019 1503   BUN 13 09/30/2019 1503   CREATININE 0.73 09/30/2019 1503   CREATININE 0.80 04/09/2018 1011   CALCIUM 9.8 09/30/2019 1503   PROT 8.4 (H) 09/30/2019 1503   ALBUMIN 4.3 09/30/2019 1503   AST 19 09/30/2019 1503   ALT 10 09/30/2019 1503   ALT 25 09/17/2017 1435   ALKPHOS 95 09/30/2019 1503    BILITOT 0.4 09/30/2019 1503   GFRNONAA 75 04/09/2018 1011   GFRAA 87 04/09/2018 1011   Lab Results  Component Value Date   INR 1.0 09/30/2019   INR 0.9 09/17/2017    ASSESSMENT/PLAN: 74 year old female with a past medical history of hepatitis C (status post treatment with SVR) cirrhosis, history of constipation, IBS, history of colon polyps, diverticulosis who is seen in consult at the request of Dr. 62 to evaluate constipation, abdominal pain and history  of cirrhosis.  1. Abdominal pain/constipation --she has constipation predominance and this is likely causing her abdominal soreness and bloating symptom.  She describes infrequent bowel movements at times, large hard stools at other times and abdominal bloating.  I think we should work on improving her constipation but also pursue cross-sectional imaging to exclude diverticulitis/colitis.  Of also recommended that we pursue colonoscopy after imaging. --Trial of Linzess 72 mcg daily, 30 minutes before breakfast.  We discussed side effect of diarrhea and she should call me if this occurs.  We may need to dose titrate to a higher dose as needed for efficacy --Can continue IBgard per box instruction --CT scan of the abdomen pelvis with contrast  2.  History of colon polyps --history of prior colon polyps with last colonoscopy 9 years ago.  Surveillance colonoscopy is recommended at this time and also to evaluate the above symptoms.  We discussed the risk, benefits and alternatives and she is agreeable and wishes to proceed --Colonoscopy in the [facility] after above imaging  3. Liver cirrhosis/history of Hepatitis C --history of cirrhosis secondary to hepatitis C.  Fortunately there is no evidence for portal hypertension and her hepatitis C has been effectively treated and she has reached sustained virologic response.  I have discussed cirrhosis management with her and recommended variceal screening and HCC screening.  We are pursuing CT scan of the  abdomen pelvis as above which will suffice for [facility] screening.  I checked INR which is normal, platelets are normal all a very good sign arguing against significant portal hypertension.  Check AFP.  Continue to limit alcohol use --CT scan of the abdomen pelvis for HCC screening; after CT scan would recommend ultrasound in 12 months --Upper endoscopy for variceal screening --No evidence for ascites, encephalopathy, jaundice --She is up-to-date with vaccination      WY:OVZCHYIF, Austin Miles, [place] [address] [place],  [place] [address]

## 2019-09-30 NOTE — Patient Instructions (Addendum)
If you are age 72 or older, your body mass index should be between 23-30. Your Body mass index is 32.44 kg/m. If this is out of the aforementioned range listed, please consider follow up with your Primary Care Provider.  If you are age 16 or younger, your body mass index should be between 19-25. Your Body mass index is 32.44 kg/m. If this is out of the aformentioned range listed, please consider follow up with your Primary Care Provider.   Your provider has requested that you go to the basement level for lab work before leaving today. Press "B" on the elevator. The lab is located at the first door on the left as you exit the elevator.  Due to recent changes in healthcare laws, you may see the results of your imaging and laboratory studies on MyChart before your provider has had a chance to review them.  We understand that in some cases there may be results that are confusing or concerning to you. Not all laboratory results come back in the same time frame and the provider may be waiting for multiple results in order to interpret others.  Please give [place] 48 hours in order for your provider to thoroughly review all the results before contacting the office for clarification of your results.   You have been scheduled for an endoscopy and colonoscopy. Please follow the written instructions given to you at your visit today. Please pick up your prep supplies at the pharmacy within the next 1-3 days. If you use inhalers (even only as needed), please bring them with you on the day of your procedure.  You have been scheduled for a CT scan of the abdomen and pelvis at [facility] Radiology Department.   You are scheduled on 10/07/19 at 3:00pm. You should arrive 15 minutes prior to your appointment time for registration. Please follow the written instructions below on the day of your exam:  1) Do not eat or drink anything after 11:00am (4 hours prior to your test) 2) You have been given 2 bottles of oral  contrast to drink. The solution may taste better if refrigerated, but do NOT add ice or any other liquid to this solution. Shake well before drinking.    Drink 1 bottle of contrast @ 1:00pm (2 hours prior to your exam)  Drink 1 bottle of contrast @ 2:00pm (1 hour prior to your exam)  You may take any medications as prescribed with a small amount of water, if necessary. If you take any of the following medications: METFORMIN, GLUCOPHAGE, GLUCOVANCE, AVANDAMET, RIOMET, FORTAMET, [place] MET, JANUMET, GLUMETZA or METAGLIP, you MAY be asked to HOLD this medication 48 hours AFTER the exam.  The purpose of you drinking the oral contrast is to aid in the visualization of your intestinal tract. The contrast solution may cause some diarrhea. Depending on your individual set of symptoms, you may also receive an intravenous injection of x-ray contrast/dye.   This test typically takes 30-45 minutes to complete.  If you have any questions regarding your exam or if you need to reschedule, you may call the CT department at [phone] between the hours of 8:00 am and 5:00 pm, Monday-Friday.  ______________________________________________________  We have sent the following medications to your pharmacy for you to pick up at your convenience:  START: Linzess 15mg take 1 capsule everyday  CONTINUE: IBguard as you have been  Thank you, Dr JZenovia Jarred

## 2019-10-01 ENCOUNTER — Telehealth: Payer: Self-pay | Admitting: Internal Medicine

## 2019-10-01 LAB — AFP TUMOR MARKER: AFP-Tumor Marker: 2.1 ng/mL

## 2019-10-01 NOTE — Telephone Encounter (Signed)
Pt would like to know where to pick up contrast for CT.  She went to outpatient pharmacy and was told that it is not there.

## 2019-10-01 NOTE — Telephone Encounter (Signed)
Patient aware that she can pick up contrast for CT at [facility].  Patient agreed and verbalized understanding.  No further questions.

## 2019-10-07 ENCOUNTER — Other Ambulatory Visit: Payer: Self-pay

## 2019-10-07 ENCOUNTER — Encounter (HOSPITAL_COMMUNITY): Payer: Self-pay

## 2019-10-07 ENCOUNTER — Ambulatory Visit (HOSPITAL_COMMUNITY)
Admission: RE | Admit: 2019-10-07 | Discharge: 2019-10-07 | Disposition: A | Discharge status: Home / Self Care | Payer: PPO | Source: Ambulatory Visit | Attending: Internal Medicine | Admitting: Internal Medicine

## 2019-10-07 DIAGNOSIS — K7460 Unspecified cirrhosis of liver: Secondary | ICD-10-CM | POA: Insufficient documentation

## 2019-10-07 DIAGNOSIS — R1032 Left lower quadrant pain: Secondary | ICD-10-CM | POA: Diagnosis not present

## 2019-10-07 DIAGNOSIS — K5900 Constipation, unspecified: Secondary | ICD-10-CM | POA: Diagnosis not present

## 2019-10-07 DIAGNOSIS — R109 Unspecified abdominal pain: Secondary | ICD-10-CM | POA: Diagnosis not present

## 2019-10-07 MED ORDER — SODIUM CHLORIDE (PF) 0.9 % IJ SOLN
INTRAMUSCULAR | Status: AC
  Filled 2019-10-07: qty 50

## 2019-10-07 MED ORDER — IOHEXOL 300 MG/ML  SOLN
100.0000 mL | Freq: Once | INTRAMUSCULAR | Status: AC | PRN
  Administered 2019-10-07: 100 mL via INTRAVENOUS

## 2019-10-20 ENCOUNTER — Encounter: Payer: Self-pay | Admitting: Internal Medicine

## 2019-10-26 ENCOUNTER — Other Ambulatory Visit: Payer: Self-pay

## 2019-10-26 ENCOUNTER — Encounter: Payer: Self-pay | Admitting: Family

## 2019-10-26 ENCOUNTER — Ambulatory Visit (INDEPENDENT_AMBULATORY_CARE_PROVIDER_SITE_OTHER): Payer: PPO | Admitting: Family

## 2019-10-26 VITALS — BP 154/90 | HR 88 | Temp 98.2°F | Ht 66.0 in | Wt 201.6 lb

## 2019-10-26 DIAGNOSIS — K120 Recurrent oral aphthae: Secondary | ICD-10-CM | POA: Diagnosis not present

## 2019-10-26 DIAGNOSIS — I10 Essential (primary) hypertension: Secondary | ICD-10-CM

## 2019-10-26 MED ORDER — MAGIC MOUTHWASH: 5.0000 mL | Freq: Four times a day (QID) | ORAL | 0 refills | Status: DC | PRN

## 2019-10-26 MED ORDER — HYDROCHLOROTHIAZIDE 25 MG PO TABS: 25.0000 mg | ORAL_TABLET | Freq: Every day | ORAL | 1 refills | Status: DC

## 2019-10-26 NOTE — Progress Notes (Signed)
Tammera Engert is a 74 y.o. female with the following history as recorded in [employer]:  Patient Active Problem List   Diagnosis Date Noted  . Constipation 08/20/2019  . GERD (gastroesophageal reflux disease) 02/19/2019  . Travel advice encounter 05/07/2018  . Finger pain, right 02/20/2018  . Low back pain 02/20/2018  . Liver fibrosis 10/09/2017  . Need for prophylactic vaccination and inoculation against viral hepatitis 10/09/2017  . Hep C w/o coma, chronic ([place]) 08/26/2017  . Routine general medical examination at a health care facility 08/18/2017  . Right shoulder pain 08/18/2017  . Essential hypertension 09/21/2015  . Arnold-Chiari malformation ([place]) 09/21/2015  . Insomnia 09/21/2015  . PTSD (post-traumatic stress disorder) 09/21/2015    Current Outpatient Medications  Medication Sig Dispense Refill  . aspirin-acetaminophen-caffeine (EXCEDRIN MIGRAINE) 250-250-65 MG tablet Take 1 tablet by mouth every 6 (six) hours as needed for headache.    . diphenhydrAMINE (SOMINEX) 25 MG tablet Take 25 mg by mouth at bedtime as needed for sleep.    . hydrochlorothiazide (HYDRODIURIL) 25 MG tablet Take 1 tablet (25 mg total) by mouth daily. 90 tablet 1  . levalbuterol (XOPENEX HFA) 45 MCG/ACT inhaler Inhale 1-2 puffs into the lungs every 4 (four) hours as needed for wheezing. 1 Inhaler 12  . linaclotide (LINZESS) 72 MCG capsule Take 1 capsule (72 mcg total) by mouth daily before breakfast. 30 capsule 3  . Na Sulfate-K Sulfate-Mg Sulf (SUPREP BOWEL PREP KIT) 17.5-3.13-1.6 GM/177ML SOLN Take 1 kit by mouth as directed. Apply Coupon=BIN: [id number] PCN: CN GROUP: [id number] ID: [id number] 324 mL 0  . silver sulfADIAZINE (SILVADENE) 1 % cream Apply 1 application topically daily. 50 g 0  . temazepam (RESTORIL) 7.5 MG capsule Take 1 capsule (7.5 mg total) by mouth at bedtime as needed for sleep. 30 capsule 0  . magic mouthwash SOLN Take 5 mLs by mouth 4 (four) times daily as needed for mouth pain. Please mix  equal parts of Maalox Extra Strength, Nystatin and Diphenhydramine 150 mL 0   No current facility-administered medications for this visit.    Allergies: Morphine and related and Sulfa antibiotics  Past Medical History:  Diagnosis Date  . Anxiety   . Arnold-Chiari malformation ([place])   . Arthritis   . Cirrhosis ([place])   . Colon polyps   . Depression   . Diverticulitis   . Diverticulosis   . Headache   . Hepatitis C   . Hypertension   . IBS (irritable bowel syndrome)   . UTI (urinary tract infection)     Past Surgical History:  Procedure Laterality Date  . BRAIN SURGERY  2003   decompression  . BREAST EXCISIONAL BIOPSY Bilateral   . DILATION AND CURETTAGE OF UTERUS    . OVARIAN CYST REMOVAL Bilateral   . REDUCTION MAMMAPLASTY Bilateral     Family History  Problem Relation Age of Onset  . Arthritis Mother   . Hypertension Mother   . Kidney disease Mother   . Diabetes Mother   . Alcohol abuse Father   . Arthritis Father   . Lung cancer Father   . Hypertension Sister   . Diabetes Maternal Grandmother   . Breast cancer Paternal Grandmother   . Parkinson's disease Son   . Hypertension Daughter   . Diabetes Maternal Aunt   . Kidney failure Maternal Aunt   . Diabetes Maternal Uncle   . Rheum arthritis Sister     Social History   Tobacco Use  . Smoking status: Former Smoker  Types: Cigarettes    Quit date: 09/29/1969    Years since quitting: 50.1  . Smokeless tobacco: Never Used  Substance Use Topics  . 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   Objective:  Vitals:   10/26/19 1048  BP: (!) 154/90  Pulse: 88  Temp: 98.2 F  (36.8 C)  TempSrc: Oral  SpO2: 96%  Weight: 201 lb 9.6 oz (91.4 kg)  Height: '5\' 6"'  (1.676 m)    General: Well developed, well nourished, in no acute distress  Skin : Warm and dry.  Head: Normocephalic and atraumatic  Eyes: Sclera and conjunctiva clear; pupils round and reactive to light; extraocular movements intact  Ears: External normal; canals clear; tympanic membranes normal  Oropharynx: Pink, supple. 2 lesions noted on right side of tongue Neck: Supple without thyromegaly, adenopathy  Lungs: Respirations unlabored;  Neurologic: Alert and oriented; speech intact; face symmetrical; moves all extremities well; CNII-XII intact without focal deficit   Assessment:  1. Aphthous ulcer of tongue   2. Essential hypertension     Plan:  1. Diagnosis discussed; she understands that stress can contribute; Rx for Magic Mouthwash- use as directed; 2. Uncontrolled- increase HCTZ to 25 mg daily; follow-up with her PCP in 1 month for re-check; patient has plans to help work on stress reduction in her life and hopefully this will help her blood pressure as well.  This visit occurred during the SARS-CoV-2 public health emergency.  Safety protocols were in place, including screening questions prior to the visit, additional usage of staff PPE, and extensive cleaning of exam room while observing appropriate contact time as indicated for disinfecting solutions.     Return in about 1 month (around 11/26/2019) for Dr. Sharlet Salina.  No orders of the defined types were placed in this encounter.   Requested Prescriptions   Signed Prescriptions Disp Refills  . hydrochlorothiazide (HYDRODIURIL) 25 MG tablet 90 tablet 1    Sig: Take 1 tablet (25 mg total) by mouth daily.  . magic mouthwash SOLN 150 mL 0    Sig: Take 5 mLs by mouth 4 (four) times daily as needed for mouth pain. Please mix equal parts of Maalox Extra Strength, Nystatin and Diphenhydramine

## 2019-10-28 ENCOUNTER — Telehealth: Payer: Self-pay | Admitting: Internal Medicine

## 2019-10-28 ENCOUNTER — Other Ambulatory Visit: Payer: Self-pay

## 2019-10-28 MED ORDER — MAGIC MOUTHWASH: 5.0000 mL | Freq: Four times a day (QID) | ORAL | 0 refills | Status: DC | PRN

## 2019-10-28 NOTE — Telephone Encounter (Signed)
Re-sent in today for patient.

## 2019-10-28 NOTE — Telephone Encounter (Signed)
New Message:   Pt is calling and states the pharmacy still hasn't received the order for magic mouthwash SOLN. Please resend if possible.

## 2019-10-29 ENCOUNTER — Telehealth: Payer: Self-pay | Admitting: Internal Medicine

## 2019-10-29 ENCOUNTER — Other Ambulatory Visit: Payer: Self-pay | Admitting: Family

## 2019-10-29 MED ORDER — SUPREP BOWEL PREP KIT 17.5-3.13-1.6 GM/177ML PO SOLN: 1.0000 | ORAL | 0 refills | Status: DC

## 2019-10-29 MED ORDER — MAGIC MOUTHWASH: 5.0000 mL | Freq: Four times a day (QID) | ORAL | 0 refills | Status: DC | PRN

## 2019-10-29 NOTE — Telephone Encounter (Signed)
I have spoken to patient. She indicates that the day she was here for office visit, she was told to go across the street for CT contrast. When she went there, she was told that there was nothing there for her, so she went home as she was already tired from her appointment. After that, she called back to our office asking if the contrast could just be called to her pharmacy. She was advised (by unknown staff) that medication was already at her pharmacy. Suprep, her colonoscopy prep was at pharmacy but patient was confused thinking the CT contrast could be called to her pharmacy as well. Patient picked up Suprep and then drank it prior to her CT which was completed on 10/07/19. She did NOT drink her oral CT contrast. Patient is now requesting additional Suprep liquid since she has colonoscopy scheduled for Monday and now realizes that she drank the wrong prep for her CT. I have explained that radiology would have the contrast and that we have actually just been given instruction to have radiology provide the contrast rather than providing it here at the office. I apologized for her confusion and told her that I would look in to ways to correct this issue so it wouldn't be a problem in the future. I have also called in an additional Suprep and it is $4. Patient verbalizes understanding of this. A note will be placed in [facility].

## 2019-10-29 NOTE — Telephone Encounter (Signed)
Patient called states she has not received her prep medication

## 2019-10-29 NOTE — Telephone Encounter (Signed)
   Pharmacy called, clarification to order given

## 2019-10-29 NOTE — Telephone Encounter (Signed)
Pt called again stating that her pharmacy has not received prescription for prep. She is going to call her pharmacy to inform that prep was sent on 4/8. She is asking that we call her pharmacy again and call her after. She is getting concerned because her proc is on Monday and she does not want her proc to be cancelled because of this and to be charged a cancellation fee.

## 2019-10-29 NOTE — Telephone Encounter (Signed)
   Pharmacy states they still do not have mouthwash prescription

## 2019-10-29 NOTE — Telephone Encounter (Signed)
Katina informed pharmacy okay to adjust mouthwash and add malox.

## 2019-11-01 ENCOUNTER — Ambulatory Visit (AMBULATORY_SURGERY_CENTER): Payer: PPO | Admitting: Internal Medicine

## 2019-11-01 ENCOUNTER — Encounter: Payer: Self-pay | Admitting: Internal Medicine

## 2019-11-01 ENCOUNTER — Other Ambulatory Visit: Payer: Self-pay

## 2019-11-01 VITALS — BP 167/89 | HR 76 | Temp 97.5°F | Resp 15 | Ht 66.0 in | Wt 201.0 lb

## 2019-11-01 DIAGNOSIS — R140 Abdominal distension (gaseous): Secondary | ICD-10-CM

## 2019-11-01 DIAGNOSIS — R1032 Left lower quadrant pain: Secondary | ICD-10-CM | POA: Diagnosis not present

## 2019-11-01 DIAGNOSIS — R109 Unspecified abdominal pain: Secondary | ICD-10-CM | POA: Diagnosis not present

## 2019-11-01 DIAGNOSIS — K2970 Gastritis, unspecified, without bleeding: Secondary | ICD-10-CM | POA: Diagnosis not present

## 2019-11-01 DIAGNOSIS — K449 Diaphragmatic hernia without obstruction or gangrene: Secondary | ICD-10-CM

## 2019-11-01 DIAGNOSIS — Z86010 Personal history of colonic polyps: Secondary | ICD-10-CM | POA: Diagnosis not present

## 2019-11-01 DIAGNOSIS — K7460 Unspecified cirrhosis of liver: Secondary | ICD-10-CM | POA: Diagnosis not present

## 2019-11-01 DIAGNOSIS — K259 Gastric ulcer, unspecified as acute or chronic, without hemorrhage or perforation: Secondary | ICD-10-CM | POA: Diagnosis not present

## 2019-11-01 MED ORDER — SODIUM CHLORIDE 0.9 % IV SOLN: 500.0000 mL | Freq: Once | INTRAVENOUS | Status: DC

## 2019-11-01 NOTE — Progress Notes (Signed)
Pt's states no medical or surgical changes since previsit or office visit. 

## 2019-11-01 NOTE — Progress Notes (Signed)
Called to room to assist during endoscopic procedure.  Patient ID and intended procedure confirmed with present staff. Received instructions for my participation in the procedure from the performing physician.  

## 2019-11-01 NOTE — Patient Instructions (Signed)
Upper Endoscopy:  Handout on Gastritis given to you today  Await biopsy results from stomach biopsies   Colonoscopy:  Handouts on diverticulosis & hemorrhoids given to you today  No biopsies done in colon   YOU HAD AN ENDOSCOPIC PROCEDURE TODAY AT THE [facility]:   Refer to the procedure report that was given to you for any specific questions about what was found during the examination.  If the procedure report does not answer your questions, please call your gastroenterologist to clarify.  If you requested that your care partner not be given the details of your procedure findings, then the procedure report has been included in a sealed envelope for you to review at your convenience later.  YOU SHOULD EXPECT: Some feelings of bloating in the abdomen. Passage of more gas than usual.  Walking can help get rid of the air that was put into your GI tract during the procedure and reduce the bloating. If you had a lower endoscopy (such as a colonoscopy or flexible sigmoidoscopy) you may notice spotting of blood in your stool or on the toilet paper. If you underwent a bowel prep for your procedure, you may not have a normal bowel movement for a few days.  Please Note:  You might notice some irritation and congestion in your nose or some drainage.  This is from the oxygen used during your procedure.  There is no need for concern and it should clear up in a day or so.  SYMPTOMS TO REPORT IMMEDIATELY:   Following lower endoscopy (colonoscopy or flexible sigmoidoscopy):  Excessive amounts of blood in the stool  Significant tenderness or worsening of abdominal pains  Swelling of the abdomen that is new, acute  Fever of 100F or higher   Following upper endoscopy (EGD)  Vomiting of blood or coffee ground material  New chest pain or pain under the shoulder blades  Painful or persistently difficult swallowing  New shortness of breath  Fever of 100F or higher  Black,  tarry-looking stools  For urgent or emergent issues, a gastroenterologist can be reached at any hour by calling [phone]. Do not use MyChart messaging for urgent concerns.    DIET:  We do recommend a small meal at first, but then you may proceed to your regular diet.  Drink plenty of fluids but you should avoid alcoholic beverages for 24 hours.  ACTIVITY:  You should plan to take it easy for the rest of today and you should NOT DRIVE or use heavy machinery until tomorrow (because of the sedation medicines used during the test).    FOLLOW UP: Our staff will call the number listed on your records 48-72 hours following your procedure to check on you and address any questions or concerns that you may have regarding the information given to you following your procedure. If we do not reach you, we will leave a message.  We will attempt to reach you two times.  During this call, we will ask if you have developed any symptoms of COVID 19. If you develop any symptoms (ie: fever, flu-like symptoms, shortness of breath, cough etc.) before then, please call [phone].  If you test positive for Covid 19 in the 2 weeks post procedure, please call and report this information to [place].    If any biopsies were taken you will be contacted by phone or by letter within the next 1-3 weeks.  Please call us at [phone] if you have not heard about  the biopsies in 3 weeks.    SIGNATURES/CONFIDENTIALITY: You and/or your care partner have signed paperwork which will be entered into your electronic medical record.  These signatures attest to the fact that that the information above on your After Visit Summary has been reviewed and is understood.  Full responsibility of the confidentiality of this discharge information lies with you and/or your care-partner.

## 2019-11-01 NOTE — Progress Notes (Signed)
A/ox3, pleased with MAC, report to RN 

## 2019-11-01 NOTE — Op Note (Signed)
[facility] Patient Name: Angela Ramirez Procedure Date: 11/01/2019 2:01 PM MRN: [id number] Endoscopist: Beverley Fiedler , MD Age: 74 Referring MD:  Date of Birth: [birth year] Gender: Female Account #: [id number] Procedure:                Colonoscopy Indications:              High risk colon cancer surveillance: Personal                            history of colonic polyps, Last colonoscopy: 2012 Medicines:                Monitored Anesthesia Care Procedure:                Pre-Anesthesia Assessment:                           - Prior to the procedure, a History and Physical                            was performed, and patient medications and                            allergies were reviewed. The patient's tolerance of                            previous anesthesia was also reviewed. The risks                            and benefits of the procedure and the sedation                            options and risks were discussed with the patient.                            All questions were answered, and informed consent                            was obtained. Prior Anticoagulants: The patient has                            taken no previous anticoagulant or antiplatelet                            agents. ASA Grade Assessment: III - A patient with                            severe systemic disease. After reviewing the risks                            and benefits, the patient was deemed in                            satisfactory condition to undergo the procedure.  After obtaining informed consent, the colonoscope                            was passed under direct vision. Throughout the                            procedure, the patient's blood pressure, pulse, and                            oxygen saturations were monitored continuously. The                            Colonoscope was introduced through the anus and                            advanced to  the cecum, identified by appendiceal                            orifice and ileocecal valve. The colonoscopy was                            performed without difficulty. The patient tolerated                            the procedure well. The quality of the bowel                            preparation was good. The ileocecal valve,                            appendiceal orifice, and rectum were photographed. Scope In: 2:17:08 PM Scope Out: 2:31:33 PM Scope Withdrawal Time: 0 hours 10 minutes 57 seconds  Total Procedure Duration: 0 hours 14 minutes 25 seconds  Findings:                 The digital rectal exam was normal.                           A diffuse area of moderate melanosis was found in                            the entire colon.                           Multiple small and large-mouthed diverticula were                            found in the sigmoid colon.                           Internal hemorrhoids were found during                            retroflexion. The hemorrhoids were small. Complications:            No  immediate complications. Estimated Blood Loss:     Estimated blood loss: none. Impression:               - Melanosis in the colon.                           - Severe diverticulosis in the sigmoid colon.                           - Small internal hemorrhoids.                           - No specimens collected. Recommendation:           - Patient has a contact number available for                            emergencies. The signs and symptoms of potential                            delayed complications were discussed with the                            patient. Return to normal activities tomorrow.                            Written discharge instructions were provided to the                            patient.                           - Resume previous diet.                           - Continue present medications.                           - No repeat  colonoscopy due to age and the absence                            of colonic polyps. Jerene Bears, MD 11/01/2019 2:49:44 PM This report has been signed electronically.

## 2019-11-01 NOTE — Op Note (Signed)
[facility] Patient Name: Angela Ramirez Procedure Date: 11/01/2019 2:03 PM MRN: [id number] Endoscopist: Jerene Bears , MD Age: 73 Referring MD:  Date of Birth: [birth year] Gender: Female Account #: [id number] Procedure:                Upper GI endoscopy Indications:              Cirrhosis rule out esophageal varices Medicines:                Monitored Anesthesia Care Procedure:                Pre-Anesthesia Assessment:                           - Prior to the procedure, a History and Physical                            was performed, and patient medications and                            allergies were reviewed. The patient's tolerance of                            previous anesthesia was also reviewed. The risks                            and benefits of the procedure and the sedation                            options and risks were discussed with the patient.                            All questions were answered, and informed consent                            was obtained. Prior Anticoagulants: The patient has                            taken no previous anticoagulant or antiplatelet                            agents. ASA Grade Assessment: III - A patient with                            severe systemic disease. After reviewing the risks                            and benefits, the patient was deemed in                            satisfactory condition to undergo the procedure.                           After obtaining informed consent, the endoscope was  passed under direct vision. Throughout the                            procedure, the patient's blood pressure, pulse, and                            oxygen saturations were monitored continuously. The                            Endoscope was introduced through the mouth, and                            advanced to the second part of duodenum. The upper                            GI endoscopy was  accomplished without difficulty.                            The patient tolerated the procedure well. Scope In: Scope Out: Findings:                 Normal mucosa was found in the entire esophagus. No                            evidence of varices.                           A 2 cm hiatal hernia was present.                           Moderate inflammation characterized by congestion                            (edema), erosions and erythema was found in the                            gastric antrum. Biopsies were taken with a cold                            forceps for histology and Helicobacter pylori                            testing.                           The examined duodenum was normal. Complications:            No immediate complications. Estimated Blood Loss:     Estimated blood loss: none. Impression:               - Normal mucosa was found in the entire esophagus.                           - No evidence of esophageal or gastric varices.                           -  2 cm hiatal hernia.                           - Gastritis. Biopsied.                           - Normal examined duodenum. Recommendation:           - Patient has a contact number available for                            emergencies. The signs and symptoms of potential                            delayed complications were discussed with the                            patient. Return to normal activities tomorrow.                            Written discharge instructions were provided to the                            patient.                           - Resume previous diet.                           - Continue present medications.                           - Repeat upper endoscopy in 2 years for screening                            purposes. Beverley Fiedler, MD 11/01/2019 2:38:28 PM This report has been signed electronically.

## 2019-11-03 ENCOUNTER — Telehealth: Payer: Self-pay

## 2019-11-03 NOTE — Telephone Encounter (Signed)
  Follow up Call-  Call back number 11/01/2019  Post procedure Call Back phone  # [phone]  Permission to leave phone message Yes  Some recent data might be hidden     Patient questions:  Do you have a fever, pain , or abdominal swelling? No. Pain Score  0 *  Have you tolerated food without any problems? Yes.    Have you been able to return to your normal activities? Yes.    Do you have any questions about your discharge instructions: Diet   No. Medications  No. Follow up visit  No.  Do you have questions or concerns about your Care? No.  Actions: * If pain score is 4 or above: No action needed, pain <4.  1. Have you developed a fever since your procedure? no  2.   Have you had an respiratory symptoms (SOB or cough) since your procedure? no  3.   Have you tested positive for COVID 19 since your procedure no  4.   Have you had any family members/close contacts diagnosed with the COVID 19 since your procedure?  no   If yes to any of these questions please route to Laverna Peace, RN and Charlett Lango, RN

## 2019-11-08 ENCOUNTER — Encounter: Payer: Self-pay | Admitting: Internal Medicine

## 2019-11-26 ENCOUNTER — Other Ambulatory Visit: Payer: Self-pay

## 2019-11-26 ENCOUNTER — Ambulatory Visit (INDEPENDENT_AMBULATORY_CARE_PROVIDER_SITE_OTHER): Payer: PPO | Admitting: Internal Medicine

## 2019-11-26 ENCOUNTER — Encounter: Payer: Self-pay | Admitting: Internal Medicine

## 2019-11-26 VITALS — BP 166/94 | Temp 98.3°F | Ht 66.0 in | Wt 201.0 lb

## 2019-11-26 DIAGNOSIS — M545 Low back pain, unspecified: Secondary | ICD-10-CM

## 2019-11-26 DIAGNOSIS — B182 Chronic viral hepatitis C: Secondary | ICD-10-CM | POA: Diagnosis not present

## 2019-11-26 DIAGNOSIS — K5900 Constipation, unspecified: Secondary | ICD-10-CM

## 2019-11-26 DIAGNOSIS — I10 Essential (primary) hypertension: Secondary | ICD-10-CM | POA: Diagnosis not present

## 2019-11-26 LAB — COMPREHENSIVE METABOLIC PANEL
ALT: 13 U/L (ref 0–35)
AST: 22 U/L (ref 0–37)
Albumin: 4.5 g/dL (ref 3.5–5.2)
Alkaline Phosphatase: 90 U/L (ref 39–117)
BUN: 27 mg/dL — ABNORMAL HIGH (ref 6–23)
CO2: 32 mEq/L (ref 19–32)
Calcium: 10.4 mg/dL (ref 8.4–10.5)
Chloride: 96 mEq/L (ref 96–112)
Creatinine, Ser: 0.81 mg/dL (ref 0.40–1.20)
GFR: 69.6 mL/min (ref >60.00)
Glucose, Bld: 102 mg/dL — ABNORMAL HIGH (ref 70–99)
Potassium: 4.4 mEq/L (ref 3.5–5.1)
Sodium: 134 mEq/L — ABNORMAL LOW (ref 135–145)
Total Bilirubin: 0.3 mg/dL (ref 0.2–1.2)
Total Protein: 8.6 g/dL — ABNORMAL HIGH (ref 6.0–8.3)

## 2019-11-26 LAB — MAGNESIUM: Magnesium: 2.1 mg/dL (ref 1.5–2.5)

## 2019-11-26 MED ORDER — BACLOFEN 10 MG PO TABS: 10.0000 mg | ORAL_TABLET | Freq: Three times a day (TID) | ORAL | 0 refills | Status: DC

## 2019-11-26 MED ORDER — TRAMADOL HCL 50 MG PO TABS: 50.0000 mg | ORAL_TABLET | Freq: Three times a day (TID) | ORAL | 0 refills | Status: AC | PRN

## 2019-11-26 NOTE — Assessment & Plan Note (Signed)
Refill baclofen and tramadol which she uses rarely.

## 2019-11-26 NOTE — Progress Notes (Signed)
   Subjective:   Patient ID: Angela Ramirez, female    DOB: January 14, 1946, 74 y.o.   MRN: [id number]  HPI The patient is a 74 YO female coming in for follow up blood pressure (she is now taking hctz 25 mg daily, noticing much more consistent readings at home, is having a lot less stress in her life currently, denies headaches or chest pains), and low back pain (needs refill on baclofen which she uses rarely and tramadol which she uses even more rarely, has more pain at night time with trying to sleep, this is causing her to lose sleep on certain nights), and her constipation (saw GI and recent colonoscopy without problems, no varices, started on linzess daily and this has made a huge improvement, she is also using natural foods to help with her digestion and noticed a big positive improvement, LLQ discomfort is gone, denies blood in stool).   Review of Systems  Constitutional: Negative.   HENT: Negative.   Eyes: Negative.   Respiratory: Negative for cough, chest tightness and shortness of breath.   Cardiovascular: Negative for chest pain, palpitations and leg swelling.  Gastrointestinal: Negative for abdominal distention, abdominal pain, constipation, diarrhea, nausea and vomiting.  Musculoskeletal: Negative.   Skin: Negative.   Neurological: Negative.   Psychiatric/Behavioral: Negative.     Objective:  Physical Exam Constitutional:      Appearance: She is well-developed.  HENT:     Head: Normocephalic and atraumatic.  Cardiovascular:     Rate and Rhythm: Normal rate and regular rhythm.  Pulmonary:     Effort: Pulmonary effort is normal. No respiratory distress.     Breath sounds: Normal breath sounds. No wheezing or rales.  Abdominal:     General: Bowel sounds are normal. There is no distension.     Palpations: Abdomen is soft.     Tenderness: There is no abdominal tenderness. There is no rebound.  Musculoskeletal:     Cervical back: Normal range of motion.  Skin:    General: Skin  is warm and dry.  Neurological:     Mental Status: She is alert and oriented to person, place, and time.     Coordination: Coordination normal.     Vitals:   11/26/19 1047  BP: (!) 166/94  Temp: 98.3 F (36.8 C)  Weight: 201 lb (91.2 kg)  Height: 5\' 6"  (1.676 m)    This visit occurred during the SARS-CoV-2 public health emergency.  Safety protocols were in place, including screening questions prior to the visit, additional usage of staff PPE, and extensive cleaning of exam room while observing appropriate contact time as indicated for disinfecting solutions.   Assessment & Plan:

## 2019-11-26 NOTE — Assessment & Plan Note (Signed)
Doing well on linzess and dietary measures and will continue. Colonoscopy done and another not indicated.

## 2019-11-26 NOTE — Assessment & Plan Note (Signed)
BP is much better controlled at home with hctz 25 mg daily. Checking CMP and magnesium today and adjust if needed.

## 2019-11-26 NOTE — Assessment & Plan Note (Signed)
Checking LFTs, no varices on recent endoscopy and getting q 6 months screening HCC

## 2019-11-26 NOTE — Patient Instructions (Addendum)
We will send in the baclofen and the tramadol.   We are checking the labs today.

## 2019-12-03 ENCOUNTER — Other Ambulatory Visit: Payer: Self-pay | Admitting: Internal Medicine

## 2019-12-03 NOTE — Telephone Encounter (Signed)
Pls advise if ok to refill../lmb 

## 2020-01-20 ENCOUNTER — Other Ambulatory Visit: Payer: Self-pay | Admitting: Internal Medicine

## 2020-02-11 ENCOUNTER — Ambulatory Visit (INDEPENDENT_AMBULATORY_CARE_PROVIDER_SITE_OTHER): Payer: PPO | Admitting: Internal Medicine

## 2020-02-11 ENCOUNTER — Encounter: Payer: Self-pay | Admitting: Internal Medicine

## 2020-02-11 ENCOUNTER — Other Ambulatory Visit: Payer: Self-pay

## 2020-02-11 VITALS — BP 150/80 | HR 96 | Temp 98.0°F | Ht 66.0 in | Wt 200.0 lb

## 2020-02-11 DIAGNOSIS — H6983 Other specified disorders of Eustachian tube, bilateral: Secondary | ICD-10-CM | POA: Insufficient documentation

## 2020-02-11 DIAGNOSIS — H6993 Unspecified Eustachian tube disorder, bilateral: Secondary | ICD-10-CM | POA: Insufficient documentation

## 2020-02-11 DIAGNOSIS — J309 Allergic rhinitis, unspecified: Secondary | ICD-10-CM

## 2020-02-11 DIAGNOSIS — R42 Dizziness and giddiness: Secondary | ICD-10-CM | POA: Diagnosis not present

## 2020-02-11 MED ORDER — MECLIZINE HCL 12.5 MG PO TABS
12.5000 mg | ORAL_TABLET | Freq: Three times a day (TID) | ORAL | 2 refills | Status: DC | PRN
Start: 2020-02-11 — End: 2020-12-02

## 2020-02-11 NOTE — Progress Notes (Signed)
Subjective:    Patient ID: Angela Ramirez, female    DOB: 1946-02-24, 74 y.o.   MRN: [id number]  HPI  Here to f/u; overall doing ok,  Pt denies chest pain, increasing sob or doe, wheezing, orthopnea, PND, increased LE swelling, palpitations, dizziness or syncope.  Pt denies new neurological symptoms such as new headache, or facial or extremity weakness or numbness but has recurring vertigo in the past wk, though not as severe as in the past, and always positional.  Pt denies polydipsia, polyuria.  Also, Does have several wks ongoing nasal allergy symptoms with clearish congestion, itch and sneezing, without fever, pain, ST, cough, swelling or wheezing. Past Medical History:  Diagnosis Date  . Anxiety   . Arnold-Chiari malformation (HCC)   . Arthritis   . Cirrhosis (HCC)   . Colon polyps   . Depression   . Diverticulitis   . Diverticulosis   . Headache   . Hepatitis C   . Hypertension   . IBS (irritable bowel syndrome)   . UTI (urinary tract infection)    Past Surgical History:  Procedure Laterality Date  . BRAIN SURGERY  2003   decompression  . BREAST EXCISIONAL BIOPSY Bilateral   . DILATION AND CURETTAGE OF UTERUS    . OVARIAN CYST REMOVAL Bilateral   . REDUCTION MAMMAPLASTY Bilateral     reports that she quit smoking about 50 years ago. Her smoking use included cigarettes. She has never used smokeless tobacco. She reports current alcohol use. She reports that she does not use drugs. family history includes Alcohol abuse in her father; Arthritis in her father and mother; Breast cancer in her paternal grandmother; Diabetes in her maternal aunt, maternal grandmother, maternal uncle, and mother; Hypertension in her daughter, mother, and sister; Kidney disease in her mother; Kidney failure in her maternal aunt; Lung cancer in her father; Parkinson's disease in her son; Rheum arthritis in her sister. Allergies  Allergen Reactions  . Morphine And Related Nausea And Vomiting  . Sulfa  Antibiotics Nausea And Vomiting   Current Outpatient Medications on File Prior to Visit  Medication Sig Dispense Refill  . aspirin-acetaminophen-caffeine (EXCEDRIN MIGRAINE) 250-250-65 MG tablet Take 1 tablet by mouth every 6 (six) hours as needed for headache.    . baclofen (LIORESAL) 10 MG tablet TAKE 1 TABLET(10 MG) BY MOUTH THREE TIMES DAILY 60 tablet 2  . diphenhydrAMINE (SOMINEX) 25 MG tablet Take 25 mg by mouth at bedtime as needed for sleep.    . hydrochlorothiazide (HYDRODIURIL) 25 MG tablet Take 1 tablet (25 mg total) by mouth daily. 90 tablet 1  . levalbuterol (XOPENEX HFA) 45 MCG/ACT inhaler Inhale 1-2 puffs into the lungs every 4 (four) hours as needed for wheezing. 1 Inhaler 12  . LINZESS 72 MCG capsule TAKE 1 CAPSULE(72 MCG) BY MOUTH DAILY BEFORE BREAKFAST 30 capsule 1  . magic mouthwash SOLN Take 5 mLs by mouth 4 (four) times daily as needed for mouth pain. Please mix equal parts of Maalox Extra Strength, Nystatin and Diphenhydramine 150 mL 0  . silver sulfADIAZINE (SILVADENE) 1 % cream Apply 1 application topically daily. 50 g 0  . temazepam (RESTORIL) 7.5 MG capsule Take 1 capsule (7.5 mg total) by mouth at bedtime as needed for sleep. 30 capsule 0  . magic mouthwash SOLN TAKE 5 MLS BY MOUTH FOUR TIMES DAILY AS NEEDED FOR MOUTH PAIN     No current facility-administered medications on file prior to visit.   Review of Systems All otherwise  neg per pt    Objective:   Physical Exam BP (!) 150/80 (BP Location: Left Arm, Patient Position: Sitting, Cuff Size: Large)   Pulse 96   Temp 98 F (36.7 C) (Oral)   Ht 5\' 6"  (1.676 m)   Wt 200 lb (90.7 kg)   SpO2 98%   BMI 32.28 kg/m  VS noted,  Constitutional: Pt appears in NAD HENT: Head: NCAT.  Right Ear: External ear normal.  Left Ear: External ear normal.  Eyes: . Pupils are equal, round, and reactive to light. Conjunctivae and EOM are normal Nose: without d/c or deformity Neck: Neck supple. Gross normal  ROM Cardiovascular: Normal rate and regular rhythm.   Pulmonary/Chest: Effort normal and breath sounds without rales or wheezing.  Abd:  Soft, NT, ND, + BS, no organomegaly Neurological: Pt is alert. At baseline orientation, motor grossly intact Skin: Skin is warm. No rashes, other new lesions, no LE edema Psychiatric: Pt behavior is normal without agitation  All otherwise neg per pt Lab Results  Component Value Date   WBC 6.2 08/20/2019   HGB 14.0 08/20/2019   HCT 41.9 08/20/2019   PLT 269.0 08/20/2019   GLUCOSE 102 (H) 11/26/2019   CHOL 199 02/18/2019   TRIG 58.0 02/18/2019   HDL 68.90 02/18/2019   LDLCALC 119 (H) 02/18/2019   ALT 13 11/26/2019   AST 22 11/26/2019   NA 134 (L) 11/26/2019   K 4.4 11/26/2019   CL 96 11/26/2019   CREATININE 0.81 11/26/2019   BUN 27 (H) 11/26/2019   CO2 32 11/26/2019   INR 1.0 09/30/2019   HGBA1C 5.9 02/19/2019      Assessment & Plan:

## 2020-02-12 ENCOUNTER — Encounter: Payer: Self-pay | Admitting: Internal Medicine

## 2020-02-12 NOTE — Assessment & Plan Note (Signed)
Ok for [employer] and nasacort asd

## 2020-02-12 NOTE — Assessment & Plan Note (Signed)
Ok for mucinex bid prn otc

## 2020-02-12 NOTE — Patient Instructions (Signed)
Please take all new medication as recommended - mucinex, zyrtec and nasacort  Please take all new medication as prescribed - the antivert

## 2020-02-12 NOTE — Assessment & Plan Note (Addendum)
Mild to mod, for antivert prn,  to f/u any worsening symptoms or concerns  I spent 31 minutes in preparing to see the patient by review of recent labs, imaging and procedures, obtaining and reviewing separately obtained history, communicating with the patient and family or caregiver, ordering medications, tests or procedures, and documenting clinical information in the EHR including the differential Dx, treatment, and any further evaluation and other management of vertigo, allergies, eustachain tube dysfxn

## 2020-03-17 ENCOUNTER — Other Ambulatory Visit: Payer: Self-pay | Admitting: Internal Medicine

## 2020-03-17 DIAGNOSIS — Z1231 Encounter for screening mammogram for malignant neoplasm of breast: Secondary | ICD-10-CM

## 2020-03-20 ENCOUNTER — Other Ambulatory Visit: Payer: Self-pay | Admitting: Internal Medicine

## 2020-03-21 ENCOUNTER — Other Ambulatory Visit: Payer: Self-pay | Admitting: Internal Medicine

## 2020-04-13 ENCOUNTER — Ambulatory Visit
Admission: RE | Admit: 2020-04-13 | Discharge: 2020-04-13 | Disposition: A | Discharge status: Home / Self Care | Payer: PPO | Source: Ambulatory Visit | Attending: Internal Medicine | Admitting: Internal Medicine

## 2020-04-13 ENCOUNTER — Other Ambulatory Visit: Payer: Self-pay

## 2020-04-13 DIAGNOSIS — Z1231 Encounter for screening mammogram for malignant neoplasm of breast: Secondary | ICD-10-CM

## 2020-04-17 ENCOUNTER — Other Ambulatory Visit: Payer: Self-pay | Admitting: Internal Medicine

## 2020-04-17 DIAGNOSIS — R928 Other abnormal and inconclusive findings on diagnostic imaging of breast: Secondary | ICD-10-CM

## 2020-04-17 DIAGNOSIS — H25013 Cortical age-related cataract, bilateral: Secondary | ICD-10-CM | POA: Diagnosis not present

## 2020-04-17 DIAGNOSIS — H2513 Age-related nuclear cataract, bilateral: Secondary | ICD-10-CM | POA: Diagnosis not present

## 2020-04-17 DIAGNOSIS — H40013 Open angle with borderline findings, low risk, bilateral: Secondary | ICD-10-CM | POA: Diagnosis not present

## 2020-04-17 DIAGNOSIS — H25043 Posterior subcapsular polar age-related cataract, bilateral: Secondary | ICD-10-CM | POA: Diagnosis not present

## 2020-04-19 ENCOUNTER — Other Ambulatory Visit: Payer: Self-pay | Admitting: Family

## 2020-05-03 ENCOUNTER — Ambulatory Visit
Admission: RE | Admit: 2020-05-03 | Discharge: 2020-05-03 | Disposition: A | Discharge status: Home / Self Care | Payer: PPO | Source: Ambulatory Visit | Attending: Internal Medicine | Admitting: Internal Medicine

## 2020-05-03 ENCOUNTER — Other Ambulatory Visit: Payer: Self-pay

## 2020-05-03 DIAGNOSIS — R928 Other abnormal and inconclusive findings on diagnostic imaging of breast: Secondary | ICD-10-CM

## 2020-05-03 DIAGNOSIS — N6002 Solitary cyst of left breast: Secondary | ICD-10-CM | POA: Diagnosis not present

## 2020-05-24 DIAGNOSIS — H40013 Open angle with borderline findings, low risk, bilateral: Secondary | ICD-10-CM | POA: Diagnosis not present

## 2020-05-24 DIAGNOSIS — H25043 Posterior subcapsular polar age-related cataract, bilateral: Secondary | ICD-10-CM | POA: Diagnosis not present

## 2020-05-24 DIAGNOSIS — H2512 Age-related nuclear cataract, left eye: Secondary | ICD-10-CM | POA: Diagnosis not present

## 2020-05-24 DIAGNOSIS — H2513 Age-related nuclear cataract, bilateral: Secondary | ICD-10-CM | POA: Diagnosis not present

## 2020-05-24 DIAGNOSIS — H25013 Cortical age-related cataract, bilateral: Secondary | ICD-10-CM | POA: Diagnosis not present

## 2020-05-26 ENCOUNTER — Telehealth: Payer: Self-pay | Admitting: Internal Medicine

## 2020-05-26 NOTE — Progress Notes (Signed)
  Chronic Care Management   Note  05/26/2020 Name: Marigold Mom MRN: [id number] DOB: 04-15-1946  Aubery Date is a 74 y.o. year old female who is a primary care patient of Myrlene Broker, MD. I reached out to Marinell Blight by phone today in response to a referral sent by Ms. Velna Hatchet Baumgartner's PCP, Myrlene Broker, MD.   Ms. Depace was given information about Chronic Care Management services today including:  1. CCM service includes personalized support from designated clinical staff supervised by her physician, including individualized plan of care and coordination with other care providers 2. 24/7 contact phone numbers for assistance for urgent and routine care needs. 3. Service will only be billed when office clinical staff spend 20 minutes or more in a month to coordinate care. 4. Only one practitioner may furnish and bill the service in a calendar month. 5. The patient may stop CCM services at any time (effective at the end of the month) by phone call to the office staff.   Patient did not agree to enrollment in care management services and does not wish to consider at this time.  Follow up plan:   Carley Perdue [employer] Scheduler

## 2020-05-29 NOTE — Progress Notes (Signed)
[facility] - Clinic Note  05/31/2020     CHIEF COMPLAINT Patient presents for Retina Evaluation   HISTORY OF PRESENT ILLNESS: Angela Ramirez is a 74 y.o. female who presents to the clinic today for:   HPI    Retina Evaluation    In both eyes.  This started months ago.  Duration of months.  Associated Symptoms Glare and Photophobia.  Context:  distance vision, mid-range vision and near vision.  Treatments tried include no treatments.  I, the attending physician,  performed the HPI with the patient and updated documentation appropriately.          Comments    74 y/o female pt here for retinal clearance for cat sx w/Dr. [name].  Saw Dr. [name] on 12.1.21.  VA blurred OU (worse OS).  Denies pain, FOL, floaters, but c/o glare and photophobia OU.  No gtts.       Last edited by Rennis Chris, MD on 05/31/2020  1:30 PM. (History)    pt is here on the referral of Dr. [name] for cataract clearance, pt states her left eye has always been worse than the right, pt states she is from [place] and used to follow with an optometrist there for glasses and CL, since moving here she sees Dr. Francesca Oman and has no problems with her vision  Referring physician: Tyrone Schimke, MD No address on file  HISTORICAL INFORMATION:   Selected notes from the MEDICAL RECORD NUMBER Referred by Dr. [name] for retina clearance for cat sx.   CURRENT MEDICATIONS: No current outpatient medications on file. (Ophthalmic Drugs)   No current facility-administered medications for this visit. (Ophthalmic Drugs)   Current Outpatient Medications (Other)  Medication Sig  . aspirin-acetaminophen-caffeine (EXCEDRIN MIGRAINE) 250-250-65 MG tablet Take 1 tablet by mouth every 6 (six) hours as needed for headache.  . baclofen (LIORESAL) 10 MG tablet TAKE 1 TABLET(10 MG) BY MOUTH THREE TIMES DAILY  . diphenhydrAMINE (SOMINEX) 25 MG tablet Take 25 mg by mouth at bedtime as needed for sleep.   Marland Kitchen FLUAD QUADRIVALENT 0.5 ML injection   . hydrochlorothiazide (HYDRODIURIL) 25 MG tablet TAKE 1 TABLET(25 MG) BY MOUTH DAILY  . levalbuterol (XOPENEX HFA) 45 MCG/ACT inhaler Inhale 1-2 puffs into the lungs every 4 (four) hours as needed for wheezing.  Marland Kitchen LINZESS 72 MCG capsule TAKE 1 CAPSULE(72 MCG) BY MOUTH DAILY BEFORE BREAKFAST  . magic mouthwash SOLN Take 5 mLs by mouth 4 (four) times daily as needed for mouth pain. Please mix equal parts of Maalox Extra Strength, Nystatin and Diphenhydramine  . magic mouthwash SOLN TAKE 5 MLS BY MOUTH FOUR TIMES DAILY AS NEEDED FOR MOUTH PAIN  . meclizine (ANTIVERT) 12.5 MG tablet Take 1 tablet (12.5 mg total) by mouth 3 (three) times daily as needed for dizziness.  . silver sulfADIAZINE (SILVADENE) 1 % cream Apply 1 application topically daily.  . temazepam (RESTORIL) 7.5 MG capsule Take 1 capsule (7.5 mg total) by mouth at bedtime as needed for sleep.   No current facility-administered medications for this visit. (Other)      REVIEW OF SYSTEMS: ROS    Positive for: Gastrointestinal, Eyes, Psychiatric   Negative for: Constitutional, Neurological, Skin, Genitourinary, Musculoskeletal, HENT, Endocrine, Cardiovascular, Respiratory, Allergic/Imm, Heme/Lymph   Last edited by Celine Mans, COA on 05/31/2020  9:16 AM. (History)       ALLERGIES Allergies  Allergen Reactions  . Morphine And Related Nausea And Vomiting  . Sulfa Antibiotics Nausea And Vomiting  PAST MEDICAL HISTORY Past Medical History:  Diagnosis Date  . Anxiety   . Arnold-Chiari malformation (HCC)   . Arthritis   . Cataract    Mixed form OU  . Cirrhosis (HCC)   . Colon polyps   . Depression   . Diverticulitis   . Diverticulosis   . Headache   . Hepatitis C   . Hypertension   . IBS (irritable bowel syndrome)   . UTI (urinary tract infection)    Past Surgical History:  Procedure Laterality Date  . BRAIN SURGERY  2003   decompression  . BREAST EXCISIONAL BIOPSY  Bilateral    age 65's  . DILATION AND CURETTAGE OF UTERUS    . OVARIAN CYST REMOVAL Bilateral   . REDUCTION MAMMAPLASTY Bilateral    25+ years ago    FAMILY HISTORY Family History  Problem Relation Age of Onset  . Arthritis Mother   . Hypertension Mother   . Kidney disease Mother   . Diabetes Mother   . Alcohol abuse Father   . Arthritis Father   . Lung cancer Father   . Hypertension Sister   . Diabetes Maternal Grandmother   . Breast cancer Paternal Grandmother   . Parkinson's disease Son   . Hypertension Daughter   . Diabetes Maternal Aunt   . Kidney failure Maternal Aunt   . Diabetes Maternal Uncle   . Rheum arthritis Sister   . Breast cancer Paternal Aunt   . Breast cancer Paternal Aunt     SOCIAL HISTORY Social History   Tobacco Use  . Smoking status: Former Smoker    Types: Cigarettes    Quit date: 09/29/1969    Years since quitting: 50.7  . Smokeless tobacco: Never Used  Vaping Use  . Vaping Use: Never used  Substance Use Topics  . Alcohol use: Yes    Alcohol/week: 0.0 standard drinks    Comment: 2 glasses of wine a week  . Drug use: No         OPHTHALMIC EXAM:  Base Eye Exam    Visual Acuity (Snellen - Linear)      Right Left   Dist cc 20/30 +2 CF @ 2'   Dist ph cc NI 20/80 -2   Correction: Glasses       Tonometry (Tonopen, 9:19 AM)      Right Left   Pressure 12 13       Pupils      Dark Light Shape React APD   Right 4 3 Round Brisk None   Left 4 3 Round Brisk None       Visual Fields (Counting fingers)      Left Right    Full Full       Extraocular Movement      Right Left    Full, Ortho Full, Ortho       Neuro/Psych    Oriented x3: Yes   Mood/Affect: Normal       Dilation    Both eyes: 1.0% Mydriacyl, 2.5% Phenylephrine @ 9:19 AM        Slit Lamp and Fundus Exam    Slit Lamp Exam      Right Left   Lids/Lashes Dermatochalasis - upper lid, mild Meibomian gland dysfunction Dermatochalasis - upper lid, mild Meibomian  gland dysfunction   Conjunctiva/Sclera White and quiet White and quiet   Cornea arcus, trace Punctate epithelial erosions mild arcus, trace Punctate epithelial erosions   Anterior Chamber deep, narrow temporal angle deep,  narrow temporal angle   Iris Round and dilated Round and dilated   Lens 2-3+ Nuclear sclerosis, 2-3+ Cortical cataract 2-3+ Nuclear sclerosis, 3+ Cortical cataract, 1+ Posterior subcapsular cataract   Vitreous Vitreous syneresis, Posterior vitreous detachment, vitreous condensations Vitreous syneresis, Posterior vitreous detachment, vitreous condensations       Fundus Exam      Right Left   Disc Tilted disc, Pink and Sharp, +cupping, +PPA/PPP Compact, mild Pallor, severe tilt, 360 PPA -- broad   C/D Ratio 0.75 0.4   Macula Flat, Blunted foveal reflex, RPE mottling and clumping, No heme or edema posterior staphyloma/CR atrophy inferior macula, Blunted foveal reflex, RPE mottling, clumping and atrophy, No heme or edema, +myopic degeneration   Vessels attenuated attenuated   Periphery Attached, peripheral cystoid degeneration most prominent ST periphery; no RT/RD Attached; no RT/RD; peripheral cystoid degen        Refraction    Wearing Rx      Sphere Cylinder Axis Add   Right -4.00 +1.75 097 +2.50   Left -4.75 +2.25 110 +2.50   Age: 5747m   Type: PAL       Manifest Refraction      Sphere Cylinder Axis Dist VA   Right -3.75 +1.50 100 20/30+2   Left -16.25 +1.75 105 20/50-2          IMAGING AND PROCEDURES  Imaging and Procedures for 05/31/2020  OCT, Retina - OU - Both Eyes       Right Eye Quality was good. Central Foveal Thickness: 255. Progression has no prior data. Findings include normal foveal contour, no IRF, no SRF, myopic contour.   Left Eye Quality was good. Central Foveal Thickness: 358. Progression has no prior data. Findings include normal foveal contour, no IRF, no SRF, retinal drusen , outer retinal atrophy, myopic contour, inner retinal atrophy  (Severe myopic contour with posterior staphyloma and CR atrophy, mild ERM, no IRF/SRF).   Notes *Images captured and stored on drive  Diagnosis / Impression:  NFP, no IRF/SRF OU Myopic contour OU OS: Severe myopic contour with posterior staphyloma and CR atrophy, mild ERM  Clinical management:  See below  Abbreviations: NFP - Normal foveal profile. CME - cystoid macular edema. PED - pigment epithelial detachment. IRF - intraretinal fluid. SRF - subretinal fluid. EZ - ellipsoid zone. ERM - epiretinal membrane. ORA - outer retinal atrophy. ORT - outer retinal tubulation. SRHM - subretinal hyper-reflective material. IRHM - intraretinal hyper-reflective material                 ASSESSMENT/PLAN:    ICD-10-CM   1. 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 Severe myopia of both eyes  H52.13   3. Retinal edema  H35.81 OCT, Retina - OU - Both Eyes  4. Posterior vitreous detachment of both eyes  H43.813   5. Essential hypertension  I10   6. Hypertensive retinopathy of both eyes  H35.033   7. Combined forms of age-related cataract of both eyes  H25.813     1-3. Myopic degeneration OU (OS>OD)  - OS with posterior staphyloma / CR atrophy inf macula  - no CNV OU  - no RT/RD on peripheral exam  - monitor  - f/u 7-8 weeks  3. PVD / vitreous syneresis OU  - asymptomatic  - Discussed findings and prognosis  - No RT or RD on 360 peripheral exam  - Reviewed s/s of RT/RD  - Strict return precautions for any such  RT/RD signs/symptoms  4,5. Hypertensive retinopathy OU - discussed importance of tight BP control - monitor  6. Mixed Cataract OU - The symptoms of cataract, surgical options, and treatments and risks were discussed with patient. - discussed diagnosis and progression - under the expert management of Dr. [name] - clear from a retina standpoint to proceed with cataract surgery when pt and surgeon are ready   Ophthalmic Meds  Ordered this visit:  No orders of the defined types were placed in this encounter.     Return for f/u 7-8 weeks, myopic degeneration OU, DFE, OCT.  There are no Patient Instructions on file for this visit.  Explained the diagnoses, plan, and follow up with the patient and they expressed understanding.  Patient expressed understanding of the importance of proper follow up care.   This document serves as a record of services personally performed by Karie Chimera, MD, PhD. It was created on their behalf by Cristopher Estimable, COT an ophthalmic technician. The creation of this record is the provider's dictation and/or activities during the visit.    Electronically signed by: Cristopher Estimable, COT 12.6.21 @ 1:37 PM  Karie Chimera, M.D., Ph.D. Diseases & Surgery of the Retina and Vitreous [facility]  I have reviewed the above documentation for accuracy and completeness, and I agree with the above. Karie Chimera, M.D., Ph.D. 05/31/20 1:37 PM   Abbreviations: M myopia (nearsighted); A astigmatism; H hyperopia (farsighted); P presbyopia; Mrx spectacle prescription;  CTL contact lenses; OD right eye; OS left eye; OU both eyes  XT exotropia; ET esotropia; PEK punctate epithelial keratitis; PEE punctate epithelial erosions; DES dry eye syndrome; MGD meibomian gland dysfunction; ATs artificial tears; PFAT's preservative free artificial tears; NSC nuclear sclerotic cataract; PSC posterior subcapsular cataract; ERM epi-retinal membrane; PVD posterior vitreous detachment; RD retinal detachment; DM diabetes mellitus; DR diabetic retinopathy; NPDR non-proliferative diabetic retinopathy; PDR proliferative diabetic retinopathy; CSME clinically significant macular edema; DME diabetic macular edema; dbh dot blot hemorrhages; CWS cotton wool spot; POAG primary open angle glaucoma; C/D cup-to-disc ratio; HVF humphrey visual field; GVF goldmann visual field; OCT optical coherence tomography; IOP  intraocular pressure; BRVO Branch retinal vein occlusion; CRVO central retinal vein occlusion; CRAO central retinal artery occlusion; BRAO branch retinal artery occlusion; RT retinal tear; SB scleral buckle; PPV pars plana vitrectomy; VH Vitreous hemorrhage; PRP panretinal laser photocoagulation; IVK intravitreal kenalog; VMT vitreomacular traction; MH Macular hole;  NVD neovascularization of the disc; NVE neovascularization elsewhere; AREDS age related eye disease study; ARMD age related macular degeneration; POAG primary open angle glaucoma; EBMD epithelial/anterior basement membrane dystrophy; ACIOL anterior chamber intraocular lens; IOL intraocular lens; PCIOL posterior chamber intraocular lens; Phaco/IOL phacoemulsification with intraocular lens placement; PRK photorefractive keratectomy; LASIK laser assisted in situ keratomileusis; HTN hypertension; DM diabetes mellitus; COPD chronic obstructive pulmonary disease

## 2020-05-31 ENCOUNTER — Ambulatory Visit (INDEPENDENT_AMBULATORY_CARE_PROVIDER_SITE_OTHER): Payer: PPO | Admitting: Ophthalmology

## 2020-05-31 ENCOUNTER — Encounter (INDEPENDENT_AMBULATORY_CARE_PROVIDER_SITE_OTHER): Payer: Self-pay | Admitting: Ophthalmology

## 2020-05-31 ENCOUNTER — Other Ambulatory Visit: Payer: Self-pay

## 2020-05-31 DIAGNOSIS — H35033 Hypertensive retinopathy, bilateral: Secondary | ICD-10-CM | POA: Diagnosis not present

## 2020-05-31 DIAGNOSIS — I10 Essential (primary) hypertension: Secondary | ICD-10-CM

## 2020-05-31 DIAGNOSIS — H43813 Vitreous degeneration, bilateral: Secondary | ICD-10-CM

## 2020-05-31 DIAGNOSIS — H25813 Combined forms of age-related cataract, bilateral: Secondary | ICD-10-CM

## 2020-05-31 DIAGNOSIS — H442E3 Degenerative myopia with other maculopathy, bilateral eye: Secondary | ICD-10-CM | POA: Diagnosis not present

## 2020-05-31 DIAGNOSIS — H3581 Retinal edema: Secondary | ICD-10-CM | POA: Diagnosis not present

## 2020-05-31 DIAGNOSIS — H5213 Myopia, bilateral: Secondary | ICD-10-CM

## 2020-06-27 DIAGNOSIS — H2511 Age-related nuclear cataract, right eye: Secondary | ICD-10-CM | POA: Diagnosis not present

## 2020-06-27 DIAGNOSIS — H25011 Cortical age-related cataract, right eye: Secondary | ICD-10-CM | POA: Diagnosis not present

## 2020-06-27 DIAGNOSIS — H2512 Age-related nuclear cataract, left eye: Secondary | ICD-10-CM | POA: Diagnosis not present

## 2020-06-27 DIAGNOSIS — H25041 Posterior subcapsular polar age-related cataract, right eye: Secondary | ICD-10-CM | POA: Diagnosis not present

## 2020-07-02 NOTE — Patient Instructions (Addendum)
° °

## 2020-07-02 NOTE — Progress Notes (Signed)
Subjective:    Patient ID: Angela Ramirez, female    DOB: [birth year], 75 y.o.   MRN: [id number]  HPI The patient is here for an acute visit for BP issues   She is taking all of her medications as prescribed.       Medications and allergies reviewed with patient and updated if appropriate.  Patient Active Problem List   Diagnosis Date Noted  . Allergic rhinitis 02/11/2020  . Dysfunction of Eustachian tube, bilateral 02/11/2020  . Vertigo 02/11/2020  . Constipation 08/20/2019  . GERD (gastroesophageal reflux disease) 02/19/2019  . Travel advice encounter 05/07/2018  . Finger pain, right 02/20/2018  . Low back pain 02/20/2018  . Liver fibrosis 10/09/2017  . Need for prophylactic vaccination and inoculation against viral hepatitis 10/09/2017  . Hep C w/o coma, chronic (HCC) 08/26/2017  . Routine general medical examination at a health care facility 08/18/2017  . Right shoulder pain 08/18/2017  . Essential hypertension 09/21/2015  . Arnold-Chiari malformation (HCC) 09/21/2015  . Insomnia 09/21/2015  . PTSD (post-traumatic stress disorder) 09/21/2015    Current Outpatient Medications on File Prior to Visit  Medication Sig Dispense Refill  . aspirin-acetaminophen-caffeine (EXCEDRIN MIGRAINE) 250-250-65 MG tablet Take 1 tablet by mouth every 6 (six) hours as needed for headache.    . baclofen (LIORESAL) 10 MG tablet TAKE 1 TABLET(10 MG) BY MOUTH THREE TIMES DAILY 60 tablet 2  . diphenhydrAMINE (SOMINEX) 25 MG tablet Take 25 mg by mouth at bedtime as needed for sleep.    Marland Kitchen FLUAD QUADRIVALENT 0.5 ML injection     . hydrochlorothiazide (HYDRODIURIL) 25 MG tablet TAKE 1 TABLET(25 MG) BY MOUTH DAILY 90 tablet 1  . levalbuterol (XOPENEX HFA) 45 MCG/ACT inhaler Inhale 1-2 puffs into the lungs every 4 (four) hours as needed for wheezing. 1 Inhaler 12  . LINZESS 72 MCG capsule TAKE 1 CAPSULE(72 MCG) BY MOUTH DAILY BEFORE BREAKFAST 30 capsule 2  . magic mouthwash SOLN Take 5 mLs by  mouth 4 (four) times daily as needed for mouth pain. Please mix equal parts of Maalox Extra Strength, Nystatin and Diphenhydramine 150 mL 0  . magic mouthwash SOLN TAKE 5 MLS BY MOUTH FOUR TIMES DAILY AS NEEDED FOR MOUTH PAIN    . meclizine (ANTIVERT) 12.5 MG tablet Take 1 tablet (12.5 mg total) by mouth 3 (three) times daily as needed for dizziness. 40 tablet 2  . silver sulfADIAZINE (SILVADENE) 1 % cream Apply 1 application topically daily. 50 g 0  . temazepam (RESTORIL) 7.5 MG capsule Take 1 capsule (7.5 mg total) by mouth at bedtime as needed for sleep. 30 capsule 0   No current facility-administered medications on file prior to visit.    Past Medical History:  Diagnosis Date  . Anxiety   . Arnold-Chiari malformation (HCC)   . Arthritis   . Cataract    Mixed form OU  . Cirrhosis (HCC)   . Colon polyps   . Depression   . Diverticulitis   . Diverticulosis   . Headache   . Hepatitis C   . Hypertension   . IBS (irritable bowel syndrome)   . UTI (urinary tract infection)     Past Surgical History:  Procedure Laterality Date  . BRAIN SURGERY  2003   decompression  . BREAST EXCISIONAL BIOPSY Bilateral    age 9's  . DILATION AND CURETTAGE OF UTERUS    . OVARIAN CYST REMOVAL Bilateral   . REDUCTION MAMMAPLASTY Bilateral    25+ years  ago    Social History   Socioeconomic History  . Marital status: Widowed    Spouse name: Not on file  . Number of children: 3  . Years of education: Not on file  . Highest education level: Not on file  Occupational History  . Occupation: retired  Tobacco Use  . Smoking status: Former Smoker    Types: Cigarettes    Quit date: 09/29/1969    Years since quitting: 50.7  . Smokeless tobacco: Never Used  Vaping Use  . Vaping Use: Never used  Substance and Sexual Activity  . Alcohol use: Yes    Alcohol/week: 0.0 standard drinks    Comment: 2 glasses of wine a week  . Drug use: No  . Sexual activity: Not on file  Other Topics Concern  .  Not on file  Social History Narrative  . Not on file   Social Determinants of Health   Financial Resource Strain: Not on file  Food Insecurity: Not on file  Transportation Needs: Not on file  Physical Activity: Not on file  Stress: Not on file  Social Connections: Not on file    Family History  Problem Relation Age of Onset  . Arthritis Mother   . Hypertension Mother   . Kidney disease Mother   . Diabetes Mother   . Alcohol abuse Father   . Arthritis Father   . Lung cancer Father   . Hypertension Sister   . Diabetes Maternal Grandmother   . Breast cancer Paternal Grandmother   . Parkinson's disease Son   . Hypertension Daughter   . Diabetes Maternal Aunt   . Kidney failure Maternal Aunt   . Diabetes Maternal Uncle   . Rheum arthritis Sister   . Breast cancer Paternal Aunt   . Breast cancer Paternal Aunt     Review of Systems     Objective:  There were no vitals filed for this visit. BP Readings from Last 3 Encounters:  02/11/20 (!) 150/80  11/26/19 (!) 166/94  11/01/19 (!) 167/89   Wt Readings from Last 3 Encounters:  02/11/20 200 lb (90.7 kg)  11/26/19 201 lb (91.2 kg)  11/01/19 201 lb (91.2 kg)   There is no height or weight on file to calculate BMI.   Physical Exam    Constitutional: Appears well-developed and well-nourished. No distress.  Head: Normocephalic and atraumatic.  Neck: Neck supple. No tracheal deviation present. No thyromegaly present.  No cervical lymphadenopathy Cardiovascular: Normal rate, regular rhythm and normal heart sounds.  No murmur heard. No carotid bruit .  No edema Pulmonary/Chest: Effort normal and breath sounds normal. No respiratory distress. No has no wheezes. No rales.  Skin: Skin is warm and dry. Not diaphoretic.  Psychiatric: Normal mood and affect. Behavior is normal.       Assessment & Plan:    See Problem List for Assessment and Plan of chronic medical problems.    This visit occurred during the SARS-CoV-2  public health emergency.  Safety protocols were in place, including screening questions prior to the visit, additional usage of staff PPE, and extensive cleaning of exam room while observing appropriate contact time as indicated for disinfecting solutions.   This encounter was created in error - please disregard.

## 2020-07-03 ENCOUNTER — Encounter: Payer: PPO | Admitting: Internal Medicine

## 2020-07-03 DIAGNOSIS — I10 Essential (primary) hypertension: Secondary | ICD-10-CM

## 2020-07-04 DIAGNOSIS — H2511 Age-related nuclear cataract, right eye: Secondary | ICD-10-CM | POA: Diagnosis not present

## 2020-07-06 ENCOUNTER — Other Ambulatory Visit: Payer: Self-pay

## 2020-07-06 ENCOUNTER — Ambulatory Visit: Payer: PPO | Admitting: Internal Medicine

## 2020-07-10 ENCOUNTER — Ambulatory Visit: Payer: PPO | Admitting: Internal Medicine

## 2020-07-11 ENCOUNTER — Ambulatory Visit: Payer: PPO | Admitting: Internal Medicine

## 2020-07-12 ENCOUNTER — Ambulatory Visit: Payer: PPO | Admitting: Internal Medicine

## 2020-07-14 ENCOUNTER — Ambulatory Visit: Payer: PPO | Admitting: Internal Medicine

## 2020-07-14 ENCOUNTER — Ambulatory Visit (INDEPENDENT_AMBULATORY_CARE_PROVIDER_SITE_OTHER): Payer: PPO | Admitting: Internal Medicine

## 2020-07-14 ENCOUNTER — Encounter: Payer: Self-pay | Admitting: Internal Medicine

## 2020-07-14 ENCOUNTER — Other Ambulatory Visit: Payer: Self-pay

## 2020-07-14 VITALS — BP 178/90 | HR 110 | Temp 98.3°F | Resp 18 | Ht 66.0 in | Wt 194.8 lb

## 2020-07-14 DIAGNOSIS — I10 Essential (primary) hypertension: Secondary | ICD-10-CM

## 2020-07-14 DIAGNOSIS — F418 Other specified anxiety disorders: Secondary | ICD-10-CM

## 2020-07-14 DIAGNOSIS — M79672 Pain in left foot: Secondary | ICD-10-CM

## 2020-07-14 MED ORDER — AMLODIPINE BESYLATE 10 MG PO TABS: 10.0000 mg | ORAL_TABLET | Freq: Every day | ORAL | 0 refills | Status: DC

## 2020-07-14 MED ORDER — ALPRAZOLAM 0.25 MG PO TABS: 0.2500 mg | ORAL_TABLET | Freq: Two times a day (BID) | ORAL | 0 refills | Status: DC | PRN

## 2020-07-14 NOTE — Assessment & Plan Note (Signed)
Should be on additional medication temporarily. Rx amlodipine 10 mg daily to add to her hctz 25 mg daily.

## 2020-07-14 NOTE — Assessment & Plan Note (Signed)
Rx alprazolam 0.25 mg prn to use 1/2 to 1 pill as needed. She understands the risks and benefits and wishes to proceed.

## 2020-07-14 NOTE — Patient Instructions (Signed)
We have sent in amlodipine to take 1 pill daily while on the prednisone.   We have sent in alprazolam low dose to use if needed for anxiety.

## 2020-07-14 NOTE — Assessment & Plan Note (Signed)
Refer to podiatry

## 2020-07-14 NOTE — Progress Notes (Signed)
   Subjective:   Patient ID: Angela Ramirez, female    DOB: [birth year], 75 y.o.   MRN: [id number]  HPI The patient is a 75 YO female coming in for follow up of blood pressure. It was high this morning at home to 174/100. She was feeling well at the time. She has been using prednisone eye drops s/p eye procedure. Denies recent headaches or chest pains. Denies missing her hctz. She is also having a lot of anxiety about the recent eye surgeries and her vision and the blood pressure. Would like something as needed for short term only. Has had temazepam in the past and uses rarely for sleep only a couple times per month. Also needs referral to podiatry for let foot pain. Wants to see Gala Lewandowsky on [facility] [address]   Review of Systems  Constitutional: Negative.   HENT: Negative.   Eyes: Negative.   Respiratory: Negative for cough, chest tightness and shortness of breath.   Cardiovascular: Negative for chest pain, palpitations and leg swelling.  Gastrointestinal: Negative for abdominal distention, abdominal pain, constipation, diarrhea, nausea and vomiting.  Musculoskeletal: Negative.   Skin: Negative.   Neurological: Negative.   Psychiatric/Behavioral: The patient is nervous/anxious.     Objective:  Physical Exam Constitutional:      Appearance: She is well-developed and well-nourished.  HENT:     Head: Normocephalic and atraumatic.  Eyes:     Extraocular Movements: EOM normal.  Cardiovascular:     Rate and Rhythm: Normal rate and regular rhythm.  Pulmonary:     Effort: Pulmonary effort is normal. No respiratory distress.     Breath sounds: Normal breath sounds. No wheezing or rales.  Abdominal:     General: Bowel sounds are normal. There is no distension.     Palpations: Abdomen is soft.     Tenderness: There is no abdominal tenderness. There is no rebound.  Musculoskeletal:        General: No edema.     Cervical back: Normal range of motion.  Skin:    General: Skin is warm and dry.   Neurological:     Mental Status: She is alert and oriented to person, place, and time.     Coordination: Coordination normal.  Psychiatric:        Mood and Affect: Mood and affect normal.     Vitals:   07/14/20 1021  BP: (!) 178/90  Pulse: (!) 110  Resp: 18  Temp: 98.3 F (36.8 C)  TempSrc: Oral  SpO2: 99%  Weight: 194 lb 12.8 oz (88.4 kg)  Height: 5\' 6"  (1.676 m)    This visit occurred during the SARS-CoV-2 public health emergency.  Safety protocols were in place, including screening questions prior to the visit, additional usage of staff PPE, and extensive cleaning of exam room while observing appropriate contact time as indicated for disinfecting solutions.   Assessment & Plan:

## 2020-07-19 ENCOUNTER — Encounter (INDEPENDENT_AMBULATORY_CARE_PROVIDER_SITE_OTHER): Payer: PPO | Admitting: Ophthalmology

## 2020-07-24 ENCOUNTER — Ambulatory Visit (INDEPENDENT_AMBULATORY_CARE_PROVIDER_SITE_OTHER): Payer: PPO

## 2020-07-24 ENCOUNTER — Ambulatory Visit: Payer: PPO | Admitting: Podiatry

## 2020-07-24 ENCOUNTER — Other Ambulatory Visit: Payer: Self-pay

## 2020-07-24 DIAGNOSIS — M19072 Primary osteoarthritis, left ankle and foot: Secondary | ICD-10-CM | POA: Diagnosis not present

## 2020-07-24 DIAGNOSIS — M2012 Hallux valgus (acquired), left foot: Secondary | ICD-10-CM | POA: Diagnosis not present

## 2020-07-24 DIAGNOSIS — M2011 Hallux valgus (acquired), right foot: Secondary | ICD-10-CM

## 2020-07-24 DIAGNOSIS — M898X7 Other specified disorders of bone, ankle and foot: Secondary | ICD-10-CM

## 2020-07-24 NOTE — Progress Notes (Signed)
Subjective: 75 y.o. female very active, pleasant patient presents today as a new patient for evaluation of a symptomatic bump to the dorsal aspect of the left foot this been present for about 10 years now.  Patient states that approximately 10 years ago she was diagnosed with arthritis of the midtarsal joints to the left foot.  Slowly over the last 10 years it has increased in size.  She has tried different shoes and they all rub and irritate the lump on top of her foot.  She presents to have more definitive treatment options to alleviate her symptoms  Patient also complains of a symptomatic bunion deformity to the left foot.  This is been ongoing for several years now and is slowly gradually increased in size.  She has tried multiple conservative modalities including anti-inflammatory and shoe gear modifications with minimal relief.  She presents for further treatment and evaluation   Past Medical History:  Diagnosis Date  . Anxiety   . Arnold-Chiari malformation (HCC)   . Arthritis   . Cataract    Mixed form OU  . Cirrhosis (HCC)   . Colon polyps   . Depression   . Diverticulitis   . Diverticulosis   . Headache   . Hepatitis C   . Hypertension   . IBS (irritable bowel syndrome)   . UTI (urinary tract infection)       Objective: Physical Exam General: The patient is alert and oriented x3 in no acute distress.  Dermatology: Skin is cool, dry and supple bilateral lower extremities. Negative for open lesions or macerations.  Vascular: Palpable pedal pulses bilaterally. No edema or erythema noted. Capillary refill within normal limits.  Neurological: Epicritic and protective threshold grossly intact bilaterally.   Musculoskeletal Exam: Clinical evidence of bunion deformity noted to the respective foot. There is moderate pain on palpation range of motion of the first MPJ. Lateral deviation of the hallux noted consistent with hallux abductovalgus. Large palpable symptomatic lump  to the dorsal aspect of the bilateral feet left greater than right.  Findings consistent with the midtarsal spurring  Radiographic Exam: Increased intermetatarsal angle greater than 15 with a hallux abductus angle greater than 30 noted on AP view. Moderate degenerative changes noted within the first MPJ. Degenerative changes also noted to the midtarsal joint of the bilateral feet with periarticular spurring.  Spurring is seen and visualized on lateral view.  Assessment: 1. HAV w/ bunion deformity left 2.  Midtarsal exostosis left greater than right 3.  DJD bilateral feet   Plan of Care:  1. Patient was evaluated. X-Rays reviewed. 2. Today we discussed the conservative versus surgical management of the presenting pathology. The patient opts for surgical management. All possible complications and details of the procedure were explained. All patient questions were answered. No guarantees were expressed or implied. 3. Authorization for surgery was initiated today. Surgery will consist of left dorsal tarsal exostectomy.  Bunionectomy with first metatarsal osteotomy left 4.  Return to clinic 1 week postop        Felecia Shelling, DPM [facility]  Dr. Felecia Shelling, DPM    [address]                                        [place], [place] [address]  Office [phone]  Fax [phone]

## 2020-07-31 NOTE — Progress Notes (Signed)
[facility] - Clinic Note  08/04/2020     CHIEF COMPLAINT Patient presents for Retina Follow Up   HISTORY OF PRESENT ILLNESS: Angela Ramirez is a 75 y.o. female who presents to the clinic today for:   HPI    Retina Follow Up    Patient presents with  Other.  In both eyes.  This started 9 weeks ago.  I, the attending physician,  performed the HPI with the patient and updated documentation appropriately.          Comments    Patient here for 9 weeks retina follow up for myopic degeneration OU. Patient states vision still doing fluxuation. Difficult to read. OD was hurting soreness yesterday. Used drops had left from cat surgery. Has floaters. Eyes feel tired.       Last edited by Rennis Chris, MD on 08/04/2020  1:18 PM. (History)    pt had cataract sx with Dr. [name] last month, she feels like her vision is doing well, she endorses seeing floaters in her right eye, but denies fol  Referring physician: Tyrone Schimke, MD No address on file  HISTORICAL INFORMATION:   Selected notes from the MEDICAL RECORD NUMBER Referred by Dr. [name] for retina clearance for cat sx.   CURRENT MEDICATIONS: Current Outpatient Medications (Ophthalmic Drugs)  Medication Sig  . BESIVANCE 0.6 % SUSP Place 1 drop into the right eye 3 (three) times daily.  Marland Kitchen BROMSITE 0.075 % SOLN Place 1 drop into the right eye in the morning, at noon, and at bedtime.  . DUREZOL 0.05 % EMUL Place 1 drop into the left eye daily.   No current facility-administered medications for this visit. (Ophthalmic Drugs)   Current Outpatient Medications (Other)  Medication Sig  . ALPRAZolam (XANAX) 0.25 MG tablet Take 1 tablet (0.25 mg total) by mouth 2 (two) times daily as needed for anxiety.  Marland Kitchen amLODipine (NORVASC) 10 MG tablet Take 1 tablet (10 mg total) by mouth daily.  Marland Kitchen aspirin-acetaminophen-caffeine (EXCEDRIN MIGRAINE) 250-250-65 MG tablet Take 1 tablet by mouth every 6 (six) hours as  needed for headache.  . baclofen (LIORESAL) 10 MG tablet TAKE 1 TABLET(10 MG) BY MOUTH THREE TIMES DAILY  . diphenhydrAMINE (SOMINEX) 25 MG tablet Take 25 mg by mouth at bedtime as needed for sleep.  Marland Kitchen FLUAD QUADRIVALENT 0.5 ML injection   . hydrochlorothiazide (HYDRODIURIL) 25 MG tablet TAKE 1 TABLET(25 MG) BY MOUTH DAILY  . levalbuterol (XOPENEX HFA) 45 MCG/ACT inhaler Inhale 1-2 puffs into the lungs every 4 (four) hours as needed for wheezing.  Marland Kitchen LINZESS 72 MCG capsule TAKE 1 CAPSULE(72 MCG) BY MOUTH DAILY BEFORE BREAKFAST  . magic mouthwash SOLN Take 5 mLs by mouth 4 (four) times daily as needed for mouth pain. Please mix equal parts of Maalox Extra Strength, Nystatin and Diphenhydramine  . magic mouthwash SOLN TAKE 5 MLS BY MOUTH FOUR TIMES DAILY AS NEEDED FOR MOUTH PAIN  . meclizine (ANTIVERT) 12.5 MG tablet Take 1 tablet (12.5 mg total) by mouth 3 (three) times daily as needed for dizziness.  . silver sulfADIAZINE (SILVADENE) 1 % cream Apply 1 application topically daily.  . temazepam (RESTORIL) 7.5 MG capsule Take 1 capsule (7.5 mg total) by mouth at bedtime as needed for sleep.   No current facility-administered medications for this visit. (Other)      REVIEW OF SYSTEMS: ROS    Positive for: Gastrointestinal, Eyes, Psychiatric   Negative for: Constitutional, Neurological, Skin, Genitourinary, Musculoskeletal, HENT, Endocrine, Cardiovascular,  Respiratory, Allergic/Imm, Heme/Lymph   Last edited by Laddie Aquas, COA on 08/04/2020  9:20 AM. (History)       ALLERGIES Allergies  Allergen Reactions  . Morphine And Related Nausea And Vomiting  . Sulfa Antibiotics Nausea And Vomiting    PAST MEDICAL HISTORY Past Medical History:  Diagnosis Date  . Anxiety   . Arnold-Chiari malformation (HCC)   . Arthritis   . Cataract    Mixed form OU  . Cirrhosis (HCC)   . Colon polyps   . Depression   . Diverticulitis   . Diverticulosis   . Headache   . Hepatitis C   .  Hypertension   . IBS (irritable bowel syndrome)   . UTI (urinary tract infection)    Past Surgical History:  Procedure Laterality Date  . BRAIN SURGERY  2003   decompression  . BREAST EXCISIONAL BIOPSY Bilateral    age 88's  . DILATION AND CURETTAGE OF UTERUS    . OVARIAN CYST REMOVAL Bilateral   . REDUCTION MAMMAPLASTY Bilateral    25+ years ago    FAMILY HISTORY Family History  Problem Relation Age of Onset  . Arthritis Mother   . Hypertension Mother   . Kidney disease Mother   . Diabetes Mother   . Alcohol abuse Father   . Arthritis Father   . Lung cancer Father   . Hypertension Sister   . Diabetes Maternal Grandmother   . Breast cancer Paternal Grandmother   . Parkinson's disease Son   . Hypertension Daughter   . Diabetes Maternal Aunt   . Kidney failure Maternal Aunt   . Diabetes Maternal Uncle   . Rheum arthritis Sister   . Breast cancer Paternal Aunt   . Breast cancer Paternal Aunt     SOCIAL HISTORY Social History   Tobacco Use  . Smoking status: Former Smoker    Types: Cigarettes    Quit date: 09/29/1969    Years since quitting: 50.8  . Smokeless tobacco: Never Used  Vaping Use  . Vaping Use: Never used  Substance Use Topics  . Alcohol use: Yes    Alcohol/week: 0.0 standard drinks    Comment: 2 glasses of wine a week  . Drug use: No         OPHTHALMIC EXAM:  Base Eye Exam    Visual Acuity (Snellen - Linear)      Right Left   Dist [place] 20/25 20/40   Dist ph [place] 20/20 20/20       Tonometry (Tonopen, 9:14 AM)      Right Left   Pressure 17 19       Pupils      Dark Light Shape React APD   Right 4 3 Round Brisk None   Left 4 3 Round Brisk None       Visual Fields (Counting fingers)      Left Right    Full Full       Extraocular Movement      Right Left    Full, Ortho Full, Ortho       Neuro/Psych    Oriented x3: Yes   Mood/Affect: Normal       Dilation    Both eyes: 1.0% Mydriacyl, 2.5% Phenylephrine @ 9:14 AM         Slit Lamp and Fundus Exam    Slit Lamp Exam      Right Left   Lids/Lashes Dermatochalasis - upper lid, mild Meibomian gland dysfunction Dermatochalasis -  upper lid, mild Meibomian gland dysfunction   Conjunctiva/Sclera White and quiet White and quiet   Cornea arcus, 1+Punctate epithelial erosions, well healed temporal cataract wounds mild arcus, 1+ inferior Punctate epithelial erosions   Anterior Chamber deep, narrow temporal angle deep, narrow temporal angle   Iris Round and dilated Round and dilated   Lens PC IOL in good position PC IOL in good position   Vitreous Vitreous syneresis, Posterior vitreous detachment, vitreous condensations Vitreous syneresis, Posterior vitreous detachment, vitreous condensations       Fundus Exam      Right Left   Disc Tilted disc, Pink and Sharp, +cupping, +PPA/PPP Compact, mild Pallor, severe tilt, 360 PPA -- broad   C/D Ratio 0.75 0.4   Macula Flat, Blunted foveal reflex, RPE mottling and clumping, No heme or edema posterior staphyloma/CR atrophy inferior macula, Blunted foveal reflex, RPE mottling, clumping and atrophy, No heme, +myopic degeneration   Vessels attenuated, mild tortuousity, mild AV crossing changes attenuated   Periphery Attached, peripheral cystoid degeneration most prominent ST periphery; no RT/RD, blonde fundus Attached; no RT/RD; peripheral cystoid degen, paving stone degeneration inferiorly        Refraction    Wearing Rx    Type: PAL          IMAGING AND PROCEDURES  Imaging and Procedures for 08/04/2020  OCT, Retina - OU - Both Eyes       Right Eye Quality was good. Central Foveal Thickness: 258. Progression has been stable. Findings include normal foveal contour, no IRF, no SRF, myopic contour.   Left Eye Quality was good. Central Foveal Thickness: 315. Progression has been stable. Findings include normal foveal contour, no SRF, retinal drusen , outer retinal atrophy, myopic contour, inner retinal atrophy,  intraretinal fluid (Severe myopic contour with posterior staphyloma and CR atrophy, mild ERM, stable cystic changes v schisis nasal to staphyloma, no SRF).   Notes *Images captured and stored on drive  Diagnosis / Impression:  NFP, no SRF OU Myopic contour OU OS: Severe myopic contour with posterior staphyloma and CR atrophy, mild ERM, stable cystic changes v schisis nasal to staphyloma, no SRF  Clinical management:  See below  Abbreviations: NFP - Normal foveal profile. CME - cystoid macular edema. PED - pigment epithelial detachment. IRF - intraretinal fluid. SRF - subretinal fluid. EZ - ellipsoid zone. ERM - epiretinal membrane. ORA - outer retinal atrophy. ORT - outer retinal tubulation. SRHM - subretinal hyper-reflective material. IRHM - intraretinal hyper-reflective material                 ASSESSMENT/PLAN:    ICD-10-CM   1. 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 Severe myopia of both eyes  H52.13   3. Retinal edema  H35.81 OCT, Retina - OU - Both Eyes  4. Posterior vitreous detachment of both eyes  H43.813   5. Essential hypertension  I10   6. Hypertensive retinopathy of both eyes  H35.033   7. Pseudophakia, both eyes  Z96.1     1-3. Myopic degeneration OU (OS>OD)  - OS with posterior staphyloma / CR atrophy inf macula  - no CNV OU  - OCT OS shows severe myopic contour with posterior staphyloma and CR atrophy, mild ERM, stable cystic changes nasal to staphyloma, no SRF  - no RT/RD on peripheral exam  - monitor  - f/u 1 year, DFE, OCT  4. PVD / vitreous syneresis OU  - asymptomatic  - Discussed findings  and prognosis  - No RT or RD on 360 peripheral exam  - Reviewed s/s of RT/RD  - Strict return precautions for any such RT/RD signs/symptoms  5,6. Hypertensive retinopathy OU - discussed importance of tight BP control - monitor  7. Pseudophakia OU  - s/p CE/IOL OU (Dr. [name])  - IOL in good position, doing  well  - BCVA 20/20 OU! - monitor    Ophthalmic Meds Ordered this visit:  No orders of the defined types were placed in this encounter.     Return in about 1 year (around 08/04/2021) for f/u myopic contour OU, DFE, OCT.  There are no Patient Instructions on file for this visit.  Explained the diagnoses, plan, and follow up with the patient and they expressed understanding.  Patient expressed understanding of the importance of proper follow up care.   This document serves as a record of services personally performed by Karie Chimera, MD, PhD. It was created on their behalf by Glee Arvin. Manson Passey, OA an ophthalmic technician. The creation of this record is the provider's dictation and/or activities during the visit.    Electronically signed by: Glee Arvin. Manson Passey, [place] 02.07.2022 1:21 PM  Karie Chimera, M.D., Ph.D. Diseases & Surgery of the Retina and Vitreous [facility]  I have reviewed the above documentation for accuracy and completeness, and I agree with the above. Karie Chimera, M.D., Ph.D. 08/04/20 1:21 PM  Abbreviations: M myopia (nearsighted); A astigmatism; H hyperopia (farsighted); P presbyopia; Mrx spectacle prescription;  CTL contact lenses; OD right eye; OS left eye; OU both eyes  XT exotropia; ET esotropia; PEK punctate epithelial keratitis; PEE punctate epithelial erosions; DES dry eye syndrome; MGD meibomian gland dysfunction; ATs artificial tears; PFAT's preservative free artificial tears; NSC nuclear sclerotic cataract; PSC posterior subcapsular cataract; ERM epi-retinal membrane; PVD posterior vitreous detachment; RD retinal detachment; DM diabetes mellitus; DR diabetic retinopathy; NPDR non-proliferative diabetic retinopathy; PDR proliferative diabetic retinopathy; CSME clinically significant macular edema; DME diabetic macular edema; dbh dot blot hemorrhages; CWS cotton wool spot; POAG primary open angle glaucoma; C/D cup-to-disc ratio; HVF humphrey  visual field; GVF goldmann visual field; OCT optical coherence tomography; IOP intraocular pressure; BRVO Branch retinal vein occlusion; CRVO central retinal vein occlusion; CRAO central retinal artery occlusion; BRAO branch retinal artery occlusion; RT retinal tear; SB scleral buckle; PPV pars plana vitrectomy; VH Vitreous hemorrhage; PRP panretinal laser photocoagulation; IVK intravitreal kenalog; VMT vitreomacular traction; MH Macular hole;  NVD neovascularization of the disc; NVE neovascularization elsewhere; AREDS age related eye disease study; ARMD age related macular degeneration; POAG primary open angle glaucoma; EBMD epithelial/anterior basement membrane dystrophy; ACIOL anterior chamber intraocular lens; IOL intraocular lens; PCIOL posterior chamber intraocular lens; Phaco/IOL phacoemulsification with intraocular lens placement; PRK photorefractive keratectomy; LASIK laser assisted in situ keratomileusis; HTN hypertension; DM diabetes mellitus; COPD chronic obstructive pulmonary disease

## 2020-08-04 ENCOUNTER — Encounter (INDEPENDENT_AMBULATORY_CARE_PROVIDER_SITE_OTHER): Payer: Self-pay | Admitting: Ophthalmology

## 2020-08-04 ENCOUNTER — Other Ambulatory Visit: Payer: Self-pay

## 2020-08-04 ENCOUNTER — Ambulatory Visit (INDEPENDENT_AMBULATORY_CARE_PROVIDER_SITE_OTHER): Payer: PPO | Admitting: Ophthalmology

## 2020-08-04 DIAGNOSIS — I10 Essential (primary) hypertension: Secondary | ICD-10-CM | POA: Diagnosis not present

## 2020-08-04 DIAGNOSIS — Z961 Presence of intraocular lens: Secondary | ICD-10-CM

## 2020-08-04 DIAGNOSIS — H442E3 Degenerative myopia with other maculopathy, bilateral eye: Secondary | ICD-10-CM

## 2020-08-04 DIAGNOSIS — H35033 Hypertensive retinopathy, bilateral: Secondary | ICD-10-CM

## 2020-08-04 DIAGNOSIS — H5213 Myopia, bilateral: Secondary | ICD-10-CM | POA: Diagnosis not present

## 2020-08-04 DIAGNOSIS — H3581 Retinal edema: Secondary | ICD-10-CM | POA: Diagnosis not present

## 2020-08-04 DIAGNOSIS — H43813 Vitreous degeneration, bilateral: Secondary | ICD-10-CM | POA: Diagnosis not present

## 2020-08-04 DIAGNOSIS — H25813 Combined forms of age-related cataract, bilateral: Secondary | ICD-10-CM

## 2020-08-23 ENCOUNTER — Telehealth: Payer: Self-pay | Admitting: Urology

## 2020-08-23 NOTE — Telephone Encounter (Signed)
DOS 08/31/20   [place] Bunionectomy left- [phone] Tarsal Exostectomy left - [phone]  Received auth fax from [employer] Cpt [phone], [id number] was approved auth # [id number] good from 08/31/20-11/29/20

## 2020-08-24 ENCOUNTER — Encounter: Payer: Self-pay | Admitting: Internal Medicine

## 2020-08-24 ENCOUNTER — Telehealth: Payer: Self-pay | Admitting: Internal Medicine

## 2020-08-24 ENCOUNTER — Other Ambulatory Visit: Payer: Self-pay

## 2020-08-24 ENCOUNTER — Ambulatory Visit (INDEPENDENT_AMBULATORY_CARE_PROVIDER_SITE_OTHER): Payer: PPO

## 2020-08-24 ENCOUNTER — Ambulatory Visit (INDEPENDENT_AMBULATORY_CARE_PROVIDER_SITE_OTHER): Payer: PPO | Admitting: Internal Medicine

## 2020-08-24 VITALS — BP 130/80 | HR 96 | Temp 98.2°F | Resp 18 | Ht 67.0 in | Wt 197.0 lb

## 2020-08-24 DIAGNOSIS — M25511 Pain in right shoulder: Secondary | ICD-10-CM | POA: Diagnosis not present

## 2020-08-24 MED ORDER — AMLODIPINE BESYLATE 10 MG PO TABS
10.0000 mg | ORAL_TABLET | Freq: Every day | ORAL | 3 refills | Status: DC
Start: 2020-08-24 — End: 2022-05-21

## 2020-08-24 MED ORDER — TRAMADOL HCL 50 MG PO TABS
50.0000 mg | ORAL_TABLET | Freq: Three times a day (TID) | ORAL | 0 refills | Status: AC | PRN
Start: 2020-08-24 — End: 2020-08-29

## 2020-08-24 MED ORDER — MELOXICAM 15 MG PO TABS: 15.0000 mg | ORAL_TABLET | Freq: Every day | ORAL | 3 refills | Status: DC

## 2020-08-24 MED ORDER — AMLODIPINE BESYLATE 10 MG PO TABS
10.0000 mg | ORAL_TABLET | Freq: Every day | ORAL | 3 refills | Status: DC
Start: 2020-08-24 — End: 2020-08-24

## 2020-08-24 MED ORDER — HYDROCHLOROTHIAZIDE 25 MG PO TABS
25.0000 mg | ORAL_TABLET | Freq: Every day | ORAL | 1 refills | Status: DC
Start: 2020-08-24 — End: 2020-12-11

## 2020-08-24 MED ORDER — ETODOLAC 200 MG PO CAPS: 200.0000 mg | ORAL_CAPSULE | Freq: Two times a day (BID) | ORAL | 3 refills | Status: DC

## 2020-08-24 NOTE — Progress Notes (Unsigned)
   Subjective:   Patient ID: Angela Ramirez, female    DOB: [birth year], 75 y.o.   MRN: [id number]  HPI The patient is a 75 YO female coming in for right shoulder pain and limitation of ROM. She is taking otc for this without relief. Overall worsening and limitation of ROM is new. Started months ago. Denies injury that she recalls.   Review of Systems  Constitutional: Negative.   HENT: Negative.   Eyes: Negative.   Respiratory: Negative for cough, chest tightness and shortness of breath.   Cardiovascular: Negative for chest pain, palpitations and leg swelling.  Gastrointestinal: Negative for abdominal distention, abdominal pain, constipation, diarrhea, nausea and vomiting.  Musculoskeletal: Positive for arthralgias.  Skin: Negative.   Neurological: Negative.   Psychiatric/Behavioral: Negative.     Objective:  Physical Exam Constitutional:      Appearance: She is well-developed and well-nourished.  HENT:     Head: Normocephalic and atraumatic.  Eyes:     Extraocular Movements: EOM normal.  Cardiovascular:     Rate and Rhythm: Normal rate and regular rhythm.  Pulmonary:     Effort: Pulmonary effort is normal. No respiratory distress.     Breath sounds: Normal breath sounds. No wheezing or rales.  Abdominal:     General: Bowel sounds are normal. There is no distension.     Palpations: Abdomen is soft.     Tenderness: There is no abdominal tenderness. There is no rebound.  Musculoskeletal:        General: Tenderness present. No edema.     Cervical back: Normal range of motion.     Comments: Pain with lifting arm and some ROM limitation, pain mostly axillary region  Skin:    General: Skin is warm and dry.  Neurological:     Mental Status: She is alert and oriented to person, place, and time.     Coordination: Coordination normal.  Psychiatric:        Mood and Affect: Mood and affect normal.     Vitals:   08/24/20 0902  BP: 130/80  Pulse: 96  Resp: 18  Temp: 98.2 F  (36.8 C)  TempSrc: Oral  SpO2: 97%  Weight: 197 lb (89.4 kg)  Height: 5\' 7"  (1.702 m)    This visit occurred during the SARS-CoV-2 public health emergency.  Safety protocols were in place, including screening questions prior to the visit, additional usage of staff PPE, and extensive cleaning of exam room while observing appropriate contact time as indicated for disinfecting solutions.   Assessment & Plan:

## 2020-08-24 NOTE — Telephone Encounter (Signed)
See below

## 2020-08-24 NOTE — Telephone Encounter (Signed)
Sent in lodine to replace meloxicam and amlodipine resent.

## 2020-08-24 NOTE — Telephone Encounter (Signed)
Patient called and said that she is not going to take meloxicam (MOBIC) 15 MG tablet because of its side effects. She was wondering if something else could be called in. She is also requesting a refill for amLODipine (NORVASC) 10 MG tablet she said the pharmacy did not receive the refill request. Please advise.    [id number] - [place], [place] - [address] AT [address]

## 2020-08-24 NOTE — Telephone Encounter (Signed)
Patient called and said she has taken Lodine before and she was wondering if it could replace the Meloxicam. Please advise.

## 2020-08-24 NOTE — Patient Instructions (Addendum)
We can do the imaging of the shoulder and will start meloxicam daily for the arthritis.   We have also sent in the tramadol for pain as needed.

## 2020-08-25 ENCOUNTER — Encounter: Payer: Self-pay | Admitting: Internal Medicine

## 2020-08-25 NOTE — Assessment & Plan Note (Addendum)
Ordered right shoulder x-ray. Rx tramadol to use rarely for pain. Using otc also which helps. Depending on results may benefit from sports medicine. Rx meloxicam which was changed to lodine as she had taken that in the past with good success.

## 2020-08-28 ENCOUNTER — Telehealth: Payer: Self-pay | Admitting: Internal Medicine

## 2020-08-28 NOTE — Telephone Encounter (Signed)
WCB to schedule her awv with nha.

## 2020-08-31 ENCOUNTER — Other Ambulatory Visit: Payer: Self-pay | Admitting: Podiatry

## 2020-08-31 ENCOUNTER — Encounter: Payer: Self-pay | Admitting: Podiatry

## 2020-08-31 DIAGNOSIS — M25572 Pain in left ankle and joints of left foot: Secondary | ICD-10-CM | POA: Diagnosis not present

## 2020-08-31 DIAGNOSIS — M898X7 Other specified disorders of bone, ankle and foot: Secondary | ICD-10-CM | POA: Diagnosis not present

## 2020-08-31 DIAGNOSIS — M2012 Hallux valgus (acquired), left foot: Secondary | ICD-10-CM | POA: Diagnosis not present

## 2020-08-31 DIAGNOSIS — M868X7 Other osteomyelitis, ankle and foot: Secondary | ICD-10-CM | POA: Diagnosis not present

## 2020-08-31 MED ORDER — ONDANSETRON HCL 4 MG PO TABS: 4.0000 mg | ORAL_TABLET | Freq: Three times a day (TID) | ORAL | 0 refills | Status: DC | PRN

## 2020-08-31 MED ORDER — IBUPROFEN 800 MG PO TABS: 800.0000 mg | ORAL_TABLET | Freq: Three times a day (TID) | ORAL | 1 refills | Status: DC

## 2020-08-31 MED ORDER — OXYCODONE-ACETAMINOPHEN 5-325 MG PO TABS
1.0000 | ORAL_TABLET | ORAL | 0 refills | Status: DC | PRN
Start: 2020-08-31 — End: 2020-11-10

## 2020-08-31 NOTE — Progress Notes (Signed)
PRN postop 

## 2020-09-06 ENCOUNTER — Ambulatory Visit (INDEPENDENT_AMBULATORY_CARE_PROVIDER_SITE_OTHER): Payer: PPO | Admitting: Podiatry

## 2020-09-06 ENCOUNTER — Ambulatory Visit (INDEPENDENT_AMBULATORY_CARE_PROVIDER_SITE_OTHER): Payer: PPO

## 2020-09-06 ENCOUNTER — Other Ambulatory Visit: Payer: Self-pay

## 2020-09-06 DIAGNOSIS — M2012 Hallux valgus (acquired), left foot: Secondary | ICD-10-CM

## 2020-09-06 DIAGNOSIS — M19072 Primary osteoarthritis, left ankle and foot: Secondary | ICD-10-CM

## 2020-09-06 DIAGNOSIS — M898X7 Other specified disorders of bone, ankle and foot: Secondary | ICD-10-CM

## 2020-09-06 DIAGNOSIS — Z9889 Other specified postprocedural states: Secondary | ICD-10-CM

## 2020-09-13 ENCOUNTER — Ambulatory Visit (INDEPENDENT_AMBULATORY_CARE_PROVIDER_SITE_OTHER): Payer: PPO | Admitting: Podiatry

## 2020-09-13 ENCOUNTER — Other Ambulatory Visit: Payer: Self-pay

## 2020-09-13 DIAGNOSIS — M898X7 Other specified disorders of bone, ankle and foot: Secondary | ICD-10-CM

## 2020-09-13 DIAGNOSIS — Z9889 Other specified postprocedural states: Secondary | ICD-10-CM

## 2020-09-13 DIAGNOSIS — M2012 Hallux valgus (acquired), left foot: Secondary | ICD-10-CM

## 2020-09-13 NOTE — Progress Notes (Signed)
   Subjective:  75 y.o. female presents today status post left great toe bunionectomy and tarsal exostectomy to the left foot.  DOS: 08/31/2020.  Patient states that she is feeling very well.  She has been minimally weightbearing in the cam boot.  No new complaints at this time   Past Medical History:  Diagnosis Date  . Anxiety   . Arnold-Chiari malformation (HCC)   . Arthritis   . Cataract    Mixed form OU  . Cirrhosis (HCC)   . Colon polyps   . Depression   . Diverticulitis   . Diverticulosis   . Headache   . Hepatitis C   . Hypertension   . IBS (irritable bowel syndrome)   . UTI (urinary tract infection)       Objective/Physical Exam Neurovascular status intact.  Skin incisions appear to be well coapted with staples intact. No sign of infectious process noted. No dehiscence. No active bleeding noted. Moderate edema noted to the surgical extremity.  Assessment: 1. s/p tarsal exostectomy and bunionectomy left foot. DOS: 08/31/2020   Plan of Care:  1. Patient was evaluated.  2.  Staples removed today 3.  Recommend antibiotic ointment along the incision sites and Ace wrap daily 4.  Continue minimal weightbearing in the cam boot 5.  Return to clinic in 4 weeks for follow-up x-ray   Felecia Shelling, DPM [facility]  Dr. Felecia Shelling, DPM    [address] [place], [place] [address]                Office [phone]  Fax [phone]

## 2020-09-25 ENCOUNTER — Other Ambulatory Visit: Payer: Self-pay

## 2020-09-25 ENCOUNTER — Ambulatory Visit (INDEPENDENT_AMBULATORY_CARE_PROVIDER_SITE_OTHER): Payer: PPO | Admitting: Podiatry

## 2020-09-25 ENCOUNTER — Ambulatory Visit (INDEPENDENT_AMBULATORY_CARE_PROVIDER_SITE_OTHER): Payer: PPO

## 2020-09-25 ENCOUNTER — Encounter: Payer: Self-pay | Admitting: Podiatry

## 2020-09-25 VITALS — Temp 96.2°F

## 2020-09-25 DIAGNOSIS — Z9889 Other specified postprocedural states: Secondary | ICD-10-CM

## 2020-09-28 NOTE — Progress Notes (Signed)
   Subjective:  Patient presents today status post bunionectomy with metatarsal osteotomy and tarsal exostectomy left foot. DOS: 08/31/2020.  Patient states that she is doing well.  The pain has improved significantly over the past week.  She has some slight swelling but she denies any fever chills nausea vomiting shortness of breath or chest pain.  No new complaints at this time.   Past Medical History:  Diagnosis Date  . Anxiety   . Arnold-Chiari malformation (HCC)   . Arthritis   . Cataract    Mixed form OU  . Cirrhosis (HCC)   . Colon polyps   . Depression   . Diverticulitis   . Diverticulosis   . Headache   . Hepatitis C   . Hypertension   . IBS (irritable bowel syndrome)   . UTI (urinary tract infection)       Objective/Physical Exam Neurovascular status intact.  Skin incisions appear to be well coapted with staples intact. No sign of infectious process noted. No dehiscence. No active bleeding noted. Moderate edema noted to the surgical extremity.  Radiographic Exam:  Orthopedic hardware and osteotomies sites appear to be stable with routine healing.  Assessment: 1. s/p bunionectomy with osteotomy and tarsal exostectomy left. DOS: 08/31/2020   Plan of Care:  1. Patient was evaluated. X-rays reviewed 2.  Dressings changed today.  Keep clean dry and intact x1 week 3.  Continue minimal weightbearing in the cam boot 4.  Return to clinic in 1 week   Felecia Shelling, DPM [facility]  Dr. Felecia Shelling, DPM    [address] [place], [place] [address]                Office [phone]  Fax [phone]

## 2020-10-04 NOTE — Progress Notes (Signed)
   Subjective:  75 y.o. female presents today status post left great toe bunionectomy and tarsal exostectomy to the left foot.  DOS: 08/31/2020.  Overall the patient states that she is doing very well.  She has been weightbearing in the cam boot as instructed.  No new complaints at this time   Past Medical History:  Diagnosis Date  . Anxiety   . Arnold-Chiari malformation (HCC)   . Arthritis   . Cataract    Mixed form OU  . Cirrhosis (HCC)   . Colon polyps   . Depression   . Diverticulitis   . Diverticulosis   . Headache   . Hepatitis C   . Hypertension   . IBS (irritable bowel syndrome)   . UTI (urinary tract infection)       Objective/Physical Exam Neurovascular status intact.  Skin incisions appear to be well coapted with skin incisions completely healed. No sign of infectious process noted. No dehiscence. No active bleeding noted. Moderate edema noted to the surgical extremity.  Assessment: 1. s/p tarsal exostectomy and bunionectomy left foot. DOS: 08/31/2020   Plan of Care:  1. Patient was evaluated.  2.  Patient may discontinue the cam boot.  Postsurgical shoe dispensed.  Wear daily 3.  Compression ankle sleeve dispensed.  Wear daily 4.  Return to clinic in 4 weeks for follow-up x-ray   Felecia Shelling, DPM [facility]  Dr. Felecia Shelling, DPM    [address] [place], [place] [address]                Office [phone]  Fax [phone]

## 2020-10-13 ENCOUNTER — Other Ambulatory Visit: Payer: Self-pay

## 2020-10-13 DIAGNOSIS — K7460 Unspecified cirrhosis of liver: Secondary | ICD-10-CM

## 2020-10-24 ENCOUNTER — Other Ambulatory Visit (HOSPITAL_COMMUNITY): Payer: PPO

## 2020-10-30 ENCOUNTER — Other Ambulatory Visit: Payer: Self-pay

## 2020-10-30 ENCOUNTER — Ambulatory Visit: Payer: PPO | Admitting: Podiatry

## 2020-10-30 ENCOUNTER — Ambulatory Visit (INDEPENDENT_AMBULATORY_CARE_PROVIDER_SITE_OTHER): Payer: PPO

## 2020-10-30 DIAGNOSIS — Z9889 Other specified postprocedural states: Secondary | ICD-10-CM | POA: Diagnosis not present

## 2020-11-06 ENCOUNTER — Telehealth: Payer: Self-pay | Admitting: Internal Medicine

## 2020-11-06 NOTE — Progress Notes (Signed)
  Chronic Care Management   Note  11/06/2020 Name: Willa Brocks MRN: [id number] DOB: [birth year]  Arly Salminen is a 75 y.o. year old female who is a primary care patient of Myrlene Broker, MD. I reached out to Marinell Blight by phone today in response to a referral sent by Ms. Velna Hatchet Duey's PCP, Myrlene Broker, MD.   Ms. Degraff was given information about Chronic Care Management services today including:  1. CCM service includes personalized support from designated clinical staff supervised by her physician, including individualized plan of care and coordination with other care providers 2. 24/7 contact phone numbers for assistance for urgent and routine care needs. 3. Service will only be billed when office clinical staff spend 20 minutes or more in a month to coordinate care. 4. Only one practitioner may furnish and bill the service in a calendar month. 5. The patient may stop CCM services at any time (effective at the end of the month) by phone call to the office staff.   Patient agreed to services and verbal consent obtained.   Follow up plan:   Carley Perdue [employer] Scheduler

## 2020-11-10 ENCOUNTER — Other Ambulatory Visit: Payer: Self-pay

## 2020-11-10 ENCOUNTER — Telehealth: Payer: Self-pay | Admitting: Internal Medicine

## 2020-11-10 ENCOUNTER — Ambulatory Visit (INDEPENDENT_AMBULATORY_CARE_PROVIDER_SITE_OTHER): Payer: PPO | Admitting: Internal Medicine

## 2020-11-10 ENCOUNTER — Encounter: Payer: Self-pay | Admitting: Internal Medicine

## 2020-11-10 VITALS — BP 136/80 | HR 97 | Temp 98.3°F | Resp 18 | Ht 67.0 in | Wt 194.8 lb

## 2020-11-10 DIAGNOSIS — M25511 Pain in right shoulder: Secondary | ICD-10-CM | POA: Diagnosis not present

## 2020-11-10 DIAGNOSIS — K5900 Constipation, unspecified: Secondary | ICD-10-CM

## 2020-11-10 DIAGNOSIS — I10 Essential (primary) hypertension: Secondary | ICD-10-CM

## 2020-11-10 DIAGNOSIS — R309 Painful micturition, unspecified: Secondary | ICD-10-CM | POA: Insufficient documentation

## 2020-11-10 DIAGNOSIS — K7400 Hepatic fibrosis, unspecified: Secondary | ICD-10-CM | POA: Diagnosis not present

## 2020-11-10 DIAGNOSIS — E2839 Other primary ovarian failure: Secondary | ICD-10-CM

## 2020-11-10 DIAGNOSIS — G8929 Other chronic pain: Secondary | ICD-10-CM

## 2020-11-10 LAB — COMPREHENSIVE METABOLIC PANEL
ALT: 10 U/L (ref 0–35)
AST: 16 U/L (ref 0–37)
Albumin: 4.2 g/dL (ref 3.5–5.2)
Alkaline Phosphatase: 98 U/L (ref 39–117)
BUN: 12 mg/dL (ref 6–23)
CO2: 27 mEq/L (ref 19–32)
Calcium: 9.9 mg/dL (ref 8.4–10.5)
Chloride: 98 mEq/L (ref 96–112)
Creatinine, Ser: 0.73 mg/dL (ref 0.40–1.20)
GFR: 82.13 mL/min (ref >60.00)
Glucose, Bld: 106 mg/dL — ABNORMAL HIGH (ref 70–99)
Potassium: 4 mEq/L (ref 3.5–5.1)
Sodium: 134 mEq/L — ABNORMAL LOW (ref 135–145)
Total Bilirubin: 0.5 mg/dL (ref 0.2–1.2)
Total Protein: 8.5 g/dL — ABNORMAL HIGH (ref 6.0–8.3)

## 2020-11-10 LAB — POCT URINALYSIS DIPSTICK
Bilirubin, UA: NEGATIVE
Glucose, UA: NEGATIVE
Ketones, UA: NEGATIVE
Nitrite, UA: NEGATIVE
Protein, UA: POSITIVE — AB
Spec Grav, UA: 1.015 (ref 1.010–1.025)
Urobilinogen, UA: NEGATIVE E.U./dL — AB
pH, UA: 6 (ref 5.0–8.0)

## 2020-11-10 LAB — CBC
HCT: 38.5 % (ref 36.0–46.0)
Hemoglobin: 12.8 g/dL (ref 12.0–15.0)
MCHC: 33.2 g/dL (ref 30.0–36.0)
MCV: 85.3 fl (ref 78.0–100.0)
Platelets: 330 10*3/uL (ref 150.0–400.0)
RBC: 4.52 Mil/uL (ref 3.87–5.11)
RDW: 14.6 % (ref 11.5–15.5)
WBC: 6.9 10*3/uL (ref 4.0–10.5)

## 2020-11-10 LAB — VITAMIN D 25 HYDROXY (VIT D DEFICIENCY, FRACTURES): VITD: 24.92 ng/mL — ABNORMAL LOW (ref 30.00–100.00)

## 2020-11-10 LAB — LIPID PANEL
Cholesterol: 164 mg/dL (ref 0–200)
HDL: 57.2 mg/dL (ref >39.00)
LDL Cholesterol: 88 mg/dL (ref 0–99)
NonHDL: 106.73
Total CHOL/HDL Ratio: 3
Triglycerides: 96 mg/dL (ref 0.0–149.0)
VLDL: 19.2 mg/dL (ref 0.0–40.0)

## 2020-11-10 LAB — HEMOGLOBIN A1C: Hgb A1c MFr Bld: 6 % (ref 4.6–6.5)

## 2020-11-10 MED ORDER — NITROFURANTOIN MONOHYD MACRO 100 MG PO CAPS: 100.0000 mg | ORAL_CAPSULE | Freq: Two times a day (BID) | ORAL | 0 refills | Status: DC

## 2020-11-10 MED ORDER — LINACLOTIDE 72 MCG PO CAPS: ORAL_CAPSULE | ORAL | 3 refills | Status: DC

## 2020-11-10 MED ORDER — TRAMADOL HCL 50 MG PO TABS
50.0000 mg | ORAL_TABLET | Freq: Three times a day (TID) | ORAL | 0 refills | Status: AC | PRN
Start: 2020-11-10 — End: 2020-11-15

## 2020-11-10 NOTE — Assessment & Plan Note (Signed)
Needs refill of linzess which is working well to help with constipation.

## 2020-11-10 NOTE — Telephone Encounter (Signed)
traMADol (ULTRAM) 50 MG tablet  [id number] - [place], [place] - [address] AT [facility] OF [address] [facility] [address] Phone:  [phone]  Fax:  [phone]     Last seen- 05.20.22 Patient states during her visit Dr. Okey Dupre said she would send her in a refill of this medication.

## 2020-11-10 NOTE — Assessment & Plan Note (Signed)
Rx tramadol short term to help.

## 2020-11-10 NOTE — Telephone Encounter (Signed)
It was sent in during visit

## 2020-11-10 NOTE — Patient Instructions (Addendum)
We will treat the urine infection and check the labs today. We have sent in macrobid to take 1 pill twice a day for 1 week.

## 2020-11-10 NOTE — Progress Notes (Signed)
   Subjective:   Patient ID: Angela Ramirez, female    DOB: [birth year], 75 y.o.   MRN: [id number]  HPI The patient is a 75 YO female coming in for urinary problems (started a week or so ago, increased fluids and has tried azo, overall still present maybe a little better), and pain in shoulders (hurt them and would like temporary tramadol which she has used in the past), and concern about liver (had to have sedatives for recent surgery for her foot and wants to make sure it is okay).   Review of Systems  Constitutional: Negative.   HENT: Negative.   Eyes: Negative.   Respiratory: Negative.  Negative for cough, chest tightness and shortness of breath.   Cardiovascular: Negative.  Negative for chest pain, palpitations and leg swelling.  Gastrointestinal: Negative for abdominal distention, abdominal pain, constipation, diarrhea, nausea and vomiting.  Genitourinary: Positive for dysuria and frequency. Negative for urgency.  Musculoskeletal: Positive for arthralgias and joint swelling.  Skin: Negative.   Neurological: Negative.   Psychiatric/Behavioral: Negative.     Objective:  Physical Exam Constitutional:      Appearance: She is well-developed.  HENT:     Head: Normocephalic and atraumatic.  Cardiovascular:     Rate and Rhythm: Normal rate and regular rhythm.  Pulmonary:     Effort: Pulmonary effort is normal. No respiratory distress.     Breath sounds: Normal breath sounds. No wheezing or rales.  Abdominal:     General: Bowel sounds are normal. There is no distension.     Palpations: Abdomen is soft.     Tenderness: There is no abdominal tenderness. There is no rebound.  Musculoskeletal:        General: Tenderness present.     Cervical back: Normal range of motion.  Skin:    General: Skin is warm and dry.  Neurological:     Mental Status: She is alert and oriented to person, place, and time.     Coordination: Coordination normal.     Vitals:   11/10/20 0847  BP: 136/80   Pulse: 97  Resp: 18  Temp: 98.3 F (36.8 C)  TempSrc: Oral  SpO2: 97%  Weight: 194 lb 12.8 oz (88.4 kg)  Height: 5\' 7"  (1.702 m)    This visit occurred during the SARS-CoV-2 public health emergency.  Safety protocols were in place, including screening questions prior to the visit, additional usage of staff PPE, and extensive cleaning of exam room while observing appropriate contact time as indicated for disinfecting solutions.   Assessment & Plan:

## 2020-11-10 NOTE — Assessment & Plan Note (Signed)
POC U/A consistent with cystitis and rx macrobid.

## 2020-11-10 NOTE — Assessment & Plan Note (Signed)
Checking LFTs and seeing GI yearly.

## 2020-11-10 NOTE — Assessment & Plan Note (Signed)
Checking CMP and adjust as needed.  

## 2020-11-10 NOTE — Telephone Encounter (Signed)
Noted  

## 2020-11-10 NOTE — Telephone Encounter (Signed)
See below

## 2020-11-15 NOTE — Progress Notes (Signed)
   Subjective:  75 y.o. female presents today status post left great toe bunionectomy and tarsal exostectomy to the left foot.  DOS: 08/31/2020.  Patient states that overall she is doing very well.  She only has some intermittent burning sensation occasionally.  Otherwise she has been full activity and good supportive sneakers   Past Medical History:  Diagnosis Date  . Anxiety   . Arnold-Chiari malformation (HCC)   . Arthritis   . Cataract    Mixed form OU  . Cirrhosis (HCC)   . Colon polyps   . Depression   . Diverticulitis   . Diverticulosis   . Headache   . Hepatitis C   . Hypertension   . IBS (irritable bowel syndrome)   . UTI (urinary tract infection)       Objective/Physical Exam Neurovascular status intact.  Skin incisions appear to be well coapted with skin incisions completely healed. No sign of infectious process noted. No dehiscence. No active bleeding noted.  Negative for any significant edema noted to the surgical extremity.  Assessment: 1. s/p tarsal exostectomy and bunionectomy left foot. DOS: 08/31/2020   Plan of Care:  1. Patient was evaluated.  2.  Patient may now resume full activity no restrictions 3.  Recommend good supportive stability sneakers 4.  Return to clinic as needed, when the patient is ready for right foot surgery  Felecia Shelling, DPM [facility]  Dr. Felecia Shelling, DPM    [address] [place], [place] [address]                Office [phone]  Fax [phone]

## 2020-11-22 ENCOUNTER — Ambulatory Visit (HOSPITAL_COMMUNITY): Payer: PPO

## 2020-11-22 DIAGNOSIS — A419 Sepsis, unspecified organism: Secondary | ICD-10-CM

## 2020-11-22 HISTORY — DX: Sepsis, unspecified organism: A41.9

## 2020-11-29 DIAGNOSIS — Z20822 Contact with and (suspected) exposure to covid-19: Secondary | ICD-10-CM | POA: Diagnosis not present

## 2020-12-02 ENCOUNTER — Encounter (HOSPITAL_COMMUNITY): Payer: Self-pay | Admitting: Emergency Medicine

## 2020-12-02 ENCOUNTER — Inpatient Hospital Stay (HOSPITAL_COMMUNITY)
Admission: EM | Admit: 2020-12-02 | Discharge: 2020-12-11 | DRG: 871 | Disposition: A | Discharge status: Home Health | Payer: PPO | Attending: Family Medicine | Admitting: Family Medicine

## 2020-12-02 ENCOUNTER — Emergency Department (HOSPITAL_COMMUNITY): Payer: PPO

## 2020-12-02 DIAGNOSIS — Z885 Allergy status to narcotic agent status: Secondary | ICD-10-CM | POA: Diagnosis not present

## 2020-12-02 DIAGNOSIS — R000 Tachycardia, unspecified: Secondary | ICD-10-CM | POA: Diagnosis not present

## 2020-12-02 DIAGNOSIS — J9601 Acute respiratory failure with hypoxia: Secondary | ICD-10-CM | POA: Diagnosis present

## 2020-12-02 DIAGNOSIS — Z87440 Personal history of urinary (tract) infections: Secondary | ICD-10-CM

## 2020-12-02 DIAGNOSIS — E876 Hypokalemia: Secondary | ICD-10-CM | POA: Diagnosis present

## 2020-12-02 DIAGNOSIS — R404 Transient alteration of awareness: Secondary | ICD-10-CM | POA: Diagnosis not present

## 2020-12-02 DIAGNOSIS — R0689 Other abnormalities of breathing: Secondary | ICD-10-CM | POA: Diagnosis not present

## 2020-12-02 DIAGNOSIS — K8309 Other cholangitis: Secondary | ICD-10-CM | POA: Diagnosis present

## 2020-12-02 DIAGNOSIS — H5462 Unqualified visual loss, left eye, normal vision right eye: Secondary | ICD-10-CM | POA: Diagnosis present

## 2020-12-02 DIAGNOSIS — Z79899 Other long term (current) drug therapy: Secondary | ICD-10-CM

## 2020-12-02 DIAGNOSIS — E860 Dehydration: Secondary | ICD-10-CM | POA: Diagnosis present

## 2020-12-02 DIAGNOSIS — K7460 Unspecified cirrhosis of liver: Secondary | ICD-10-CM | POA: Diagnosis present

## 2020-12-02 DIAGNOSIS — K529 Noninfective gastroenteritis and colitis, unspecified: Secondary | ICD-10-CM | POA: Diagnosis not present

## 2020-12-02 DIAGNOSIS — H53122 Transient visual loss, left eye: Secondary | ICD-10-CM | POA: Diagnosis present

## 2020-12-02 DIAGNOSIS — R748 Abnormal levels of other serum enzymes: Secondary | ICD-10-CM | POA: Diagnosis present

## 2020-12-02 DIAGNOSIS — K5730 Diverticulosis of large intestine without perforation or abscess without bleeding: Secondary | ICD-10-CM | POA: Diagnosis not present

## 2020-12-02 DIAGNOSIS — Z8249 Family history of ischemic heart disease and other diseases of the circulatory system: Secondary | ICD-10-CM

## 2020-12-02 DIAGNOSIS — B182 Chronic viral hepatitis C: Secondary | ICD-10-CM | POA: Diagnosis present

## 2020-12-02 DIAGNOSIS — R509 Fever, unspecified: Secondary | ICD-10-CM | POA: Diagnosis not present

## 2020-12-02 DIAGNOSIS — E871 Hypo-osmolality and hyponatremia: Secondary | ICD-10-CM | POA: Diagnosis not present

## 2020-12-02 DIAGNOSIS — Z841 Family history of disorders of kidney and ureter: Secondary | ICD-10-CM

## 2020-12-02 DIAGNOSIS — Z882 Allergy status to sulfonamides status: Secondary | ICD-10-CM

## 2020-12-02 DIAGNOSIS — Z803 Family history of malignant neoplasm of breast: Secondary | ICD-10-CM | POA: Diagnosis not present

## 2020-12-02 DIAGNOSIS — F419 Anxiety disorder, unspecified: Secondary | ICD-10-CM | POA: Diagnosis present

## 2020-12-02 DIAGNOSIS — Z20822 Contact with and (suspected) exposure to covid-19: Secondary | ICD-10-CM | POA: Diagnosis present

## 2020-12-02 DIAGNOSIS — R6520 Severe sepsis without septic shock: Secondary | ICD-10-CM | POA: Diagnosis present

## 2020-12-02 DIAGNOSIS — Z801 Family history of malignant neoplasm of trachea, bronchus and lung: Secondary | ICD-10-CM | POA: Diagnosis not present

## 2020-12-02 DIAGNOSIS — G9341 Metabolic encephalopathy: Secondary | ICD-10-CM | POA: Diagnosis present

## 2020-12-02 DIAGNOSIS — Z91013 Allergy to seafood: Secondary | ICD-10-CM

## 2020-12-02 DIAGNOSIS — Z86010 Personal history of colonic polyps: Secondary | ICD-10-CM

## 2020-12-02 DIAGNOSIS — E669 Obesity, unspecified: Secondary | ICD-10-CM | POA: Diagnosis present

## 2020-12-02 DIAGNOSIS — E872 Acidosis: Secondary | ICD-10-CM | POA: Diagnosis present

## 2020-12-02 DIAGNOSIS — R410 Disorientation, unspecified: Secondary | ICD-10-CM | POA: Diagnosis not present

## 2020-12-02 DIAGNOSIS — K5732 Diverticulitis of large intestine without perforation or abscess without bleeding: Secondary | ICD-10-CM | POA: Diagnosis present

## 2020-12-02 DIAGNOSIS — Z6830 Body mass index (BMI) 30.0-30.9, adult: Secondary | ICD-10-CM

## 2020-12-02 DIAGNOSIS — R918 Other nonspecific abnormal finding of lung field: Secondary | ICD-10-CM | POA: Diagnosis not present

## 2020-12-02 DIAGNOSIS — Z4682 Encounter for fitting and adjustment of non-vascular catheter: Secondary | ICD-10-CM | POA: Diagnosis not present

## 2020-12-02 DIAGNOSIS — J439 Emphysema, unspecified: Secondary | ICD-10-CM

## 2020-12-02 DIAGNOSIS — R4182 Altered mental status, unspecified: Secondary | ICD-10-CM | POA: Diagnosis not present

## 2020-12-02 DIAGNOSIS — R197 Diarrhea, unspecified: Secondary | ICD-10-CM

## 2020-12-02 DIAGNOSIS — Z8261 Family history of arthritis: Secondary | ICD-10-CM

## 2020-12-02 DIAGNOSIS — A419 Sepsis, unspecified organism: Principal | ICD-10-CM | POA: Diagnosis present

## 2020-12-02 DIAGNOSIS — R531 Weakness: Secondary | ICD-10-CM | POA: Diagnosis not present

## 2020-12-02 DIAGNOSIS — R945 Abnormal results of liver function studies: Secondary | ICD-10-CM | POA: Diagnosis not present

## 2020-12-02 DIAGNOSIS — Z9289 Personal history of other medical treatment: Secondary | ICD-10-CM

## 2020-12-02 DIAGNOSIS — Z0389 Encounter for observation for other suspected diseases and conditions ruled out: Secondary | ICD-10-CM | POA: Diagnosis not present

## 2020-12-02 DIAGNOSIS — I10 Essential (primary) hypertension: Secondary | ICD-10-CM | POA: Diagnosis present

## 2020-12-02 DIAGNOSIS — Z87891 Personal history of nicotine dependence: Secondary | ICD-10-CM

## 2020-12-02 DIAGNOSIS — Z833 Family history of diabetes mellitus: Secondary | ICD-10-CM

## 2020-12-02 DIAGNOSIS — Z811 Family history of alcohol abuse and dependence: Secondary | ICD-10-CM

## 2020-12-02 DIAGNOSIS — J90 Pleural effusion, not elsewhere classified: Secondary | ICD-10-CM | POA: Diagnosis not present

## 2020-12-02 DIAGNOSIS — R0902 Hypoxemia: Secondary | ICD-10-CM | POA: Diagnosis not present

## 2020-12-02 DIAGNOSIS — G9389 Other specified disorders of brain: Secondary | ICD-10-CM | POA: Diagnosis not present

## 2020-12-02 DIAGNOSIS — R7989 Other specified abnormal findings of blood chemistry: Secondary | ICD-10-CM | POA: Diagnosis present

## 2020-12-02 DIAGNOSIS — Z791 Long term (current) use of non-steroidal anti-inflammatories (NSAID): Secondary | ICD-10-CM

## 2020-12-02 DIAGNOSIS — H269 Unspecified cataract: Secondary | ICD-10-CM | POA: Diagnosis present

## 2020-12-02 DIAGNOSIS — Z820 Family history of epilepsy and other diseases of the nervous system: Secondary | ICD-10-CM

## 2020-12-02 HISTORY — DX: Emphysema, unspecified: J43.9

## 2020-12-02 LAB — CBC WITH DIFFERENTIAL/PLATELET
Abs Immature Granulocytes: 0.04 10*3/uL (ref 0.00–0.07)
Basophils Absolute: 0 10*3/uL (ref 0.0–0.1)
Basophils Relative: 0 %
Eosinophils Absolute: 0 10*3/uL (ref 0.0–0.5)
Eosinophils Relative: 0 %
HCT: 36.3 % (ref 36.0–46.0)
Hemoglobin: 12 g/dL (ref 12.0–15.0)
Immature Granulocytes: 1 %
Lymphocytes Relative: 10 %
Lymphs Abs: 0.7 10*3/uL (ref 0.7–4.0)
MCH: 28 pg (ref 26.0–34.0)
MCHC: 33.1 g/dL (ref 30.0–36.0)
MCV: 84.6 fL (ref 80.0–100.0)
Monocytes Absolute: 0.1 10*3/uL (ref 0.1–1.0)
Monocytes Relative: 2 %
Neutro Abs: 6.2 10*3/uL (ref 1.7–7.7)
Neutrophils Relative %: 87 %
Platelets: 181 10*3/uL (ref 150–400)
RBC: 4.29 MIL/uL (ref 3.87–5.11)
RDW: 14.9 % (ref 11.5–15.5)
WBC: 7.1 10*3/uL (ref 4.0–10.5)
nRBC: 0 % (ref 0.0–0.2)

## 2020-12-02 LAB — URINALYSIS, MICROSCOPIC (REFLEX)

## 2020-12-02 LAB — COMPREHENSIVE METABOLIC PANEL
ALT: 112 U/L — ABNORMAL HIGH (ref 0–44)
AST: 201 U/L — ABNORMAL HIGH (ref 15–41)
Albumin: 3 g/dL — ABNORMAL LOW (ref 3.5–5.0)
Alkaline Phosphatase: 102 U/L (ref 38–126)
Anion gap: 13 (ref 5–15)
BUN: 23 mg/dL (ref 8–23)
CO2: 19 mmol/L — ABNORMAL LOW (ref 22–32)
Calcium: 8.3 mg/dL — ABNORMAL LOW (ref 8.9–10.3)
Chloride: 99 mmol/L (ref 98–111)
Creatinine, Ser: 1.11 mg/dL — ABNORMAL HIGH (ref 0.44–1.00)
GFR, Estimated: 53 mL/min — ABNORMAL LOW (ref >60)
Glucose, Bld: 127 mg/dL — ABNORMAL HIGH (ref 70–99)
Potassium: 3.2 mmol/L — ABNORMAL LOW (ref 3.5–5.1)
Sodium: 131 mmol/L — ABNORMAL LOW (ref 135–145)
Total Bilirubin: 1.2 mg/dL (ref 0.3–1.2)
Total Protein: 7.4 g/dL (ref 6.5–8.1)

## 2020-12-02 LAB — URINALYSIS, ROUTINE W REFLEX MICROSCOPIC
Glucose, UA: NEGATIVE mg/dL
Ketones, ur: 15 mg/dL — AB
Leukocytes,Ua: NEGATIVE
Nitrite: NEGATIVE
Protein, ur: 100 mg/dL — AB
Specific Gravity, Urine: 1.025 (ref 1.005–1.030)
pH: 6 (ref 5.0–8.0)

## 2020-12-02 LAB — RESP PANEL BY RT-PCR (FLU A&B, COVID) ARPGX2
Influenza A by PCR: NEGATIVE
Influenza B by PCR: NEGATIVE
SARS Coronavirus 2 by RT PCR: NEGATIVE

## 2020-12-02 LAB — LACTIC ACID, PLASMA: Lactic Acid, Venous: 1.7 mmol/L (ref 0.5–1.9)

## 2020-12-02 MED ORDER — LACTATED RINGERS IV BOLUS (SEPSIS)
1000.0000 mL | Freq: Once | INTRAVENOUS | Status: AC
  Administered 2020-12-02: 1000 mL via INTRAVENOUS

## 2020-12-02 MED ORDER — VANCOMYCIN HCL 1000 MG/200ML IV SOLN: 1000.0000 mg | INTRAVENOUS | Status: DC

## 2020-12-02 MED ORDER — SODIUM CHLORIDE 0.9 % IV SOLN
2.0000 g | Freq: Once | INTRAVENOUS | Status: AC
  Administered 2020-12-02: 2 g via INTRAVENOUS
  Filled 2020-12-02: qty 2

## 2020-12-02 MED ORDER — LACTATED RINGERS IV SOLN: INTRAVENOUS | Status: AC

## 2020-12-02 MED ORDER — VANCOMYCIN HCL 1000 MG/200ML IV SOLN
1000.0000 mg | Freq: Once | INTRAVENOUS | Status: DC
  Filled 2020-12-02: qty 200

## 2020-12-02 MED ORDER — SODIUM CHLORIDE 0.9 % IV SOLN
2.0000 g | Freq: Two times a day (BID) | INTRAVENOUS | Status: DC
  Filled 2020-12-02: qty 2

## 2020-12-02 MED ORDER — LACTATED RINGERS IV BOLUS
1000.0000 mL | Freq: Once | INTRAVENOUS | Status: AC
  Administered 2020-12-03: 1000 mL via INTRAVENOUS

## 2020-12-02 MED ORDER — VANCOMYCIN HCL 1750 MG/350ML IV SOLN
1750.0000 mg | Freq: Once | INTRAVENOUS | Status: AC
  Administered 2020-12-02: 1750 mg via INTRAVENOUS
  Filled 2020-12-02: qty 350

## 2020-12-02 NOTE — Progress Notes (Signed)
Pharmacy Antibiotic Note  Angela Ramirez is a 75 y.o. female admitted on 12/02/2020 with  infection of unknown source .  Pharmacy has been consulted for Cefepime and vancomycin dosing.  Tmax 104.6. HR 110s. WBC wnl. ClCr ~ 50 ml/min. UA and cultures pending. Vancomycin 1750mg  given in ED.   6/11 Vancomycin 1000mg  Q 24 hr Scr used: 1.11 mg/dL Weight: 85 kg Est AUC: 568 if use Vd 0.5;  395 if Vd 0.72 (BMI 29.3)   Plan:  Vancomycin 1000mg  Q 24hr Cefepime 2g Q12 hr Monitor cultures, clinical status, renal fx, vanc levels Narrow abx as able and f/u duration    Height: 5\' 7"  (170.2 cm) Weight: 85 kg (187 lb 6.3 oz) IBW/kg (Calculated) : 61.6  Temp (24hrs), Avg:101.6 F (38.7 C), Min:98.5 F (36.9 C), Max:104.6 F (40.3 C)  Recent Labs  Lab 12/02/20 2054  WBC 7.1  CREATININE 1.11*     Estimated Creatinine Clearance: 51.3 mL/min (A) (by C-G formula based on SCr of 1.11 mg/dL (H)).    Allergies  Allergen Reactions   Crab (Diagnostic) Hives   Morphine And Related Nausea And Vomiting   Sulfa Antibiotics Nausea And Vomiting    Antimicrobials this admission: Cefepime 6/11 >>  Vancomycin 6/11 >>   Dose adjustments this admission:  Microbiology results: 6/11 BCx:  6/11 UCx:     Thank you for allowing pharmacy to be a part of this patient's care.  8/11, PharmD, BCPS, BCCP Clinical Pharmacist  Please check AMION for all [facility] phone numbers After 10:00 PM, call Main Pharmacy [phone]

## 2020-12-02 NOTE — ED Notes (Signed)
Ice packs removed.

## 2020-12-02 NOTE — ED Notes (Addendum)
Ice bags placed in axillary, neck, and groin area. MD made aware of pt temp, medication requested.

## 2020-12-02 NOTE — ED Notes (Signed)
Swallow screen attempted to clear pt for PO medication. Pt unable to drink from cup or straw due to tachypnea. MD made aware of repeat temp and failed swallow screen. No new orders placed at this time.

## 2020-12-02 NOTE — ED Notes (Addendum)
JVD noted, with visible bounding pulse.

## 2020-12-02 NOTE — ED Notes (Signed)
Patient transported to X-ray 

## 2020-12-02 NOTE — ED Notes (Signed)
Pt son at bedside, reports pt is normally GCS 14, A&Ox4, performs all self care. Reports pt has been feeling ill and thought she had covid, but test returned normal. Son found pt acutely altered at approximately 7pm tonight, normal cognition yesterday, but ill.

## 2020-12-02 NOTE — ED Triage Notes (Addendum)
Pt arrived via [facility] from home cc of increasing weakness, fever, malaise, tachypnea, and confusion. Per family member on scene symptoms began approximately 1 week ago with significant increase today with confusion. LSN 10pm last night. Pt found by EMS in bed covered in urine and feces. Received 500 ML NS enroute.   EMS vitals BP: 172/80 P: 120 RR: 40 ETCO2 22 BS: 124 Temp: 102.1

## 2020-12-02 NOTE — ED Provider Notes (Addendum)
[facility] EMERGENCY DEPARTMENT Provider Note   CSN: [id number] Arrival date & time: 12/02/20  2032     History Chief Complaint  Patient presents with   Fever    Angela Ramirez is a 75 y.o. female.  Patient presents via EMS with report of fevers, generalized weakness, confusion. Per report, symptoms acute onset ~ 1 week ago, but worse in past day, mod-severe, constant, persistent, worsening. Pt limited historian - level 5 caveat. Pt denies any pain. No headache. No sore throat or runny nose. No cough or sob. No chest pain. Denies abd pain or vomiting/diarrhea. Denies dysuria or flank pain. Denies extremity pain or swelling. No rash.   The history is provided by the patient and the EMS personnel. The history is limited by the condition of the patient.      Past Medical History:  Diagnosis Date   Anxiety    Arnold-Chiari malformation (HCC)    Arthritis    Cataract    Mixed form OU   Cirrhosis (HCC)    Colon polyps    Depression    Diverticulitis    Diverticulosis    Headache    Hepatitis C    Hypertension    IBS (irritable bowel syndrome)    UTI (urinary tract infection)     Patient Active Problem List   Diagnosis Date Noted   Painful urination 11/10/2020   Left foot pain 07/14/2020   Situational anxiety 07/14/2020   Allergic rhinitis 02/11/2020   Dysfunction of Eustachian tube, bilateral 02/11/2020   Vertigo 02/11/2020   Constipation 08/20/2019   GERD (gastroesophageal reflux disease) 02/19/2019   Travel advice encounter 05/07/2018   Finger pain, right 02/20/2018   Low back pain 02/20/2018   Liver fibrosis 10/09/2017   Need for prophylactic vaccination and inoculation against viral hepatitis 10/09/2017   Hep C w/o coma, chronic (HCC) 08/26/2017   Routine general medical examination at a health care facility 08/18/2017   Right shoulder pain 08/18/2017   Essential hypertension 09/21/2015   Arnold-Chiari malformation (HCC) 09/21/2015    Insomnia 09/21/2015   PTSD (post-traumatic stress disorder) 09/21/2015    Past Surgical History:  Procedure Laterality Date   BRAIN SURGERY  2003   decompression   BREAST EXCISIONAL BIOPSY Bilateral    age 50's   DILATION AND CURETTAGE OF UTERUS     OVARIAN CYST REMOVAL Bilateral    REDUCTION MAMMAPLASTY Bilateral    25+ years ago     OB History   No obstetric history on file.     Family History  Problem Relation Age of Onset   Arthritis Mother    Hypertension Mother    Kidney disease Mother    Diabetes Mother    Alcohol abuse Father    Arthritis Father    Lung cancer Father    Hypertension Sister    Diabetes Maternal Grandmother    Breast cancer Paternal Grandmother    Parkinson's disease Son    Hypertension Daughter    Diabetes Maternal Aunt    Kidney failure Maternal Aunt    Diabetes Maternal Uncle    Rheum arthritis Sister    Breast cancer Paternal Aunt    Breast cancer Paternal Aunt     Social History   Tobacco Use   Smoking status: Former    Pack years: 0.00    Types: Cigarettes    Quit date: 09/29/1969    Years since quitting: 51.2   Smokeless tobacco: Never  Vaping Use   Vaping  Use: Never used  Substance Use Topics   Alcohol use: Yes    Alcohol/week: 0.0 standard drinks    Comment: 2 glasses of wine a week   Drug use: No    Home Medications Prior to Admission medications   Medication Sig Start Date End Date Taking? Authorizing Provider  Acetaminophen-Caffeine 500-65 MG TABS Take 1 tablet by mouth every 6 (six) hours as needed (headache). Tension relief   Yes [provider]  amLODipine (NORVASC) 10 MG tablet Take 1 tablet (10 mg total) by mouth daily. Patient taking differently: Take 10 mg by mouth daily as needed (when she doesn't feel good). 08/24/20  Yes Myrlene Broker, MD  BIOTIN PO Take 1 tablet by mouth daily.   Yes [provider]  cetirizine (ZYRTEC) 10 MG tablet Take 10 mg by mouth daily as needed for allergies  or rhinitis.   Yes [provider]  etodolac (LODINE) 200 MG capsule Take 1 capsule (200 mg total) by mouth 2 (two) times daily. Patient taking differently: Take 200 mg by mouth 2 (two) times daily as needed (pain). 08/24/20  Yes Myrlene Broker, MD  Ginger, Zingiber officinalis, (GINGER ROOT PO) Take 1 tablet by mouth daily as needed (immune system boost).   Yes [provider]  guaifenesin (HUMIBID E) 400 MG TABS tablet Take 400 mg by mouth every 4 (four) hours as needed (congestion).   Yes [provider]  hydrochlorothiazide (HYDRODIURIL) 25 MG tablet Take 1 tablet (25 mg total) by mouth daily. 08/24/20  Yes Myrlene Broker, MD  ibuprofen (ADVIL) 800 MG tablet Take 1 tablet (800 mg total) by mouth 3 (three) times daily. Patient taking differently: Take 800 mg by mouth 3 (three) times daily as needed (pain). 08/31/20  Yes Felecia Shelling, DPM  linaclotide (LINZESS) 72 MCG capsule TAKE 1 CAPSULE(72 MCG) BY MOUTH DAILY BEFORE BREAKFAST Patient taking differently: Take 72 mcg by mouth daily as needed (constipation). 11/10/20  Yes Myrlene Broker, MD  meloxicam (MOBIC) 15 MG tablet Take 15 mg by mouth daily as needed for pain. 09/22/20  Yes [provider]  Multiple Vitamins-Minerals (ZINC) LOZG Take 1 lozenge by mouth daily as needed (immune system boost).   Yes [provider]  ondansetron (ZOFRAN) 4 MG tablet Take 1 tablet (4 mg total) by mouth every 8 (eight) hours as needed for nausea or vomiting. 08/31/20  Yes Felecia Shelling, DPM  Phenylephrine-DM-GG-APAP (COLD & FLU SEVERE DAYTIME) 5-10-200-325 MG TABS Take 1 tablet by mouth daily as needed (cough/cold symptoms).   Yes [provider]  Tetrahydroz-Dextran-PEG-Povid (ADVANCED FORMULA EYE DROPS) 0.05-0.1-1-1 % SOLN Place 1 drop into both eyes daily.   Yes [provider]  traMADol (ULTRAM) 50 MG tablet Take 50 mg by mouth every 6 (six) hours as needed (pain).   Yes [provider]    Allergies    Crab (diagnostic), Morphine and related, and Sulfa antibiotics  Review of Systems   Review of Systems  Constitutional:  Positive for fever.  HENT:  Negative for sore throat.   Eyes:  Negative for redness.  Respiratory:  Negative for cough and shortness of breath.   Cardiovascular:  Negative for chest pain.  Gastrointestinal:  Negative for abdominal pain, diarrhea and vomiting.  Endocrine: Negative for polyuria.  Genitourinary:  Negative for dysuria and flank pain.  Musculoskeletal:  Negative for back pain, neck pain and neck stiffness.  Skin:  Negative for rash.  Neurological:  Negative for  headaches.  Hematological:  Does not bruise/bleed easily.  Psychiatric/Behavioral:  Negative for agitation.    Physical Exam Updated Vital Signs BP 130/76   Pulse (!) 115   Temp (!) 104.6 F (40.3 C) (Rectal)   Resp (!) 24   Ht 1.702 m (5\' 7" )   Wt 85 kg   SpO2 99%   BMI 29.35 kg/m   Physical Exam Vitals and nursing note reviewed.  Constitutional:      Appearance: Normal appearance. She is well-developed. She is ill-appearing.  HENT:     Head: Atraumatic.     Nose: Nose normal.     Mouth/Throat:     Pharynx: No oropharyngeal exudate or posterior oropharyngeal erythema.     Comments: Dry mm, breathing through mouth.  Eyes:     General: No scleral icterus.    Conjunctiva/sclera: Conjunctivae normal.     Pupils: Pupils are equal, round, and reactive to light.  Neck:     Trachea: No tracheal deviation.     Comments: No stiffness or rigidity.  Cardiovascular:     Rate and Rhythm: Regular rhythm. Tachycardia present.     Pulses: Normal pulses.     Heart sounds: Normal heart sounds. No murmur heard.   No friction rub. No gallop.  Pulmonary:     Breath sounds: Normal breath sounds. No stridor.     Comments: Tachypneic.  Abdominal:     General: Bowel sounds are normal. There is no distension.     Palpations: Abdomen is soft.     Tenderness: There  is no abdominal tenderness. There is no guarding.  Genitourinary:    Comments: No cva tenderness.  Musculoskeletal:        General: No swelling or tenderness.     Cervical back: Normal range of motion and neck supple. No rigidity or tenderness. No muscular tenderness.  Lymphadenopathy:     Cervical: No cervical adenopathy.  Skin:    General: Skin is warm and dry.     Findings: No rash.  Neurological:     Mental Status: She is alert.     Comments: Alert, limited response to questions. Moves bilateral extremities purposefully.   Psychiatric:     Comments: Slow to respond.     ED Results / Procedures / Treatments   Labs (all labs ordered are listed, but only abnormal results are displayed) Results for orders placed or performed during the hospital encounter of 12/02/20  Resp Panel by RT-PCR (Flu A&B, Covid) Nasopharyngeal Swab   Specimen: Nasopharyngeal Swab; Nasopharyngeal(NP) swabs in vial transport medium  Result Value Ref Range   SARS Coronavirus 2 by RT PCR NEGATIVE NEGATIVE   Influenza A by PCR NEGATIVE NEGATIVE   Influenza B by PCR NEGATIVE NEGATIVE  Lactic acid, plasma  Result Value Ref Range   Lactic Acid, Venous 1.7 0.5 - 1.9 mmol/L  Comprehensive metabolic panel  Result Value Ref Range   Sodium 131 (L) 135 - 145 mmol/L   Potassium 3.2 (L) 3.5 - 5.1 mmol/L   Chloride 99 98 - 111 mmol/L   CO2 19 (L) 22 - 32 mmol/L   Glucose, Bld 127 (H) 70 - 99 mg/dL   BUN 23 8 - 23 mg/dL   Creatinine, Ser 02/01/21 (H) 0.44 - 1.00 mg/dL   Calcium 8.3 (L) 8.9 - 10.3 mg/dL   Total Protein 7.4 6.5 - 8.1 g/dL   Albumin 3.0 (L) 3.5 - 5.0 g/dL   AST 9.41 (H) 15 - 41 U/L   ALT  112 (H) 0 - 44 U/L   Alkaline Phosphatase 102 38 - 126 U/L   Total Bilirubin 1.2 0.3 - 1.2 mg/dL   GFR, Estimated 53 (L) >60 mL/min   Anion gap 13 5 - 15  CBC WITH DIFFERENTIAL  Result Value Ref Range   WBC 7.1 4.0 - 10.5 K/uL   RBC 4.29 3.87 - 5.11 MIL/uL   Hemoglobin 12.0 12.0 - 15.0 g/dL   HCT 16.136.3 09.636.0 - 04.546.0  %   MCV 84.6 80.0 - 100.0 fL   MCH 28.0 26.0 - 34.0 pg   MCHC 33.1 30.0 - 36.0 g/dL   RDW 40.914.9 81.111.5 - 91.415.5 %   Platelets 181 150 - 400 K/uL   nRBC 0.0 0.0 - 0.2 %   Neutrophils Relative % 87 %   Neutro Abs 6.2 1.7 - 7.7 K/uL   Lymphocytes Relative 10 %   Lymphs Abs 0.7 0.7 - 4.0 K/uL   Monocytes Relative 2 %   Monocytes Absolute 0.1 0.1 - 1.0 K/uL   Eosinophils Relative 0 %   Eosinophils Absolute 0.0 0.0 - 0.5 K/uL   Basophils Relative 0 %   Basophils Absolute 0.0 0.0 - 0.1 K/uL   Immature Granulocytes 1 %   Abs Immature Granulocytes 0.04 0.00 - 0.07 K/uL  Urinalysis, Routine w reflex microscopic In/Out Cath Urine  Result Value Ref Range   Color, Urine YELLOW YELLOW   APPearance CLEAR CLEAR   Specific Gravity, Urine 1.025 1.005 - 1.030   pH 6.0 5.0 - 8.0   Glucose, UA NEGATIVE NEGATIVE mg/dL   Hgb urine dipstick MODERATE (A) NEGATIVE   Bilirubin Urine SMALL (A) NEGATIVE   Ketones, ur 15 (A) NEGATIVE mg/dL   Protein, ur 782100 (A) NEGATIVE mg/dL   Nitrite NEGATIVE NEGATIVE   Leukocytes,Ua NEGATIVE NEGATIVE  Urinalysis, Microscopic (reflex)  Result Value Ref Range   RBC / HPF 0-5 0 - 5 RBC/hpf   WBC, UA 0-5 0 - 5 WBC/hpf   Bacteria, UA RARE (A) NONE SEEN   Squamous Epithelial / LPF 0-5 0 - 5   Mucus PRESENT    Hyaline Casts, UA PRESENT    Granular Casts, UA PRESENT      EKG EKG Interpretation  Date/Time:  Saturday December 02 2020 20:36:43 EDT Ventricular Rate:  125 PR Interval:  145 QRS Duration: 96 QT Interval:  321 QTC Calculation: 463 R Axis:   -26 Text Interpretation: Sinus tachycardia Confirmed by Cathren LaineSteinl, Leshay Desaulniers ([id number]) on 12/02/2020 8:46:42 PM  Radiology DG Chest Port 1 View  Result Date: 12/02/2020 CLINICAL DATA:  Questionable sepsis EXAM: PORTABLE CHEST 1 VIEW COMPARISON:  None. FINDINGS: Heart and mediastinal contours are within normal limits. No focal opacities or effusions. No acute bony abnormality. IMPRESSION: No active disease. Electronically Signed    By: Charlett NoseKevin  Dover M.D.   On: 12/02/2020 22:51    Procedures Procedures   Medications Ordered in ED Medications  lactated ringers infusion (has no administration in time range)  vancomycin (VANCOREADY) IVPB 1750 mg/350 mL (1,750 mg Intravenous New Bag/Given 12/02/20 2146)  lactated ringers bolus 1,000 mL (1,000 mLs Intravenous New Bag/Given 12/02/20 2112)  ceFEPIme (MAXIPIME) 2 g in sodium chloride 0.9 % 100 mL IVPB (0 g Intravenous Stopped 12/02/20 2206)    ED Course  I have reviewed the triage vital signs and the nursing notes.  Pertinent labs & imaging results that were available during my care of the patient were reviewed by me and considered in my medical decision making (  see chart for details).    MDM Rules/Calculators/A&P                         Iv ns bolus. Stat labs and imaging. Cultures sent. Code sepsis activated.   Reviewed nursing notes and prior charts for additional history.   Labs reviewed/interpreted by me - wbc normal. Additional labs pending.   CXR reviewed/interpreted by me - no pna.   Iv antibiotics given. Additional ns bolus.   Additional hx from son - states hasnt felt well for one week, no specific or consistent pain, mainly general malaise, decreased appetite. Had at one point c/o abd pain, and had several diarrhea stools. On review notes, recently on abx/macrobid for uti.   Patient currently denies headache. No neck pain, stiffness or rigidity. No cp or sob. Is not coughing. Abd soft ?v mild tenderness. Skin without rash.   Source infection unclear - will add stool studies, c diff, and get ct imaging - a couple lfts are newly elevated, remote/prior u/s without gallstones - will get ct chest/abd/pelvis (if ct suspicious, for ruq process, may need u/s).   Discussed w hospitalist - will admit.    Final Clinical Impression(s) / ED Diagnoses Final diagnoses:  None    Rx / DC Orders ED Discharge Orders     None           Cathren Laine, MD 12/03/20  [phone]

## 2020-12-02 NOTE — Progress Notes (Signed)
Pt being followed by [facility] for Sepsis protocol. 

## 2020-12-03 ENCOUNTER — Inpatient Hospital Stay (HOSPITAL_COMMUNITY): Payer: PPO

## 2020-12-03 ENCOUNTER — Emergency Department (HOSPITAL_COMMUNITY): Payer: PPO

## 2020-12-03 DIAGNOSIS — R6520 Severe sepsis without septic shock: Secondary | ICD-10-CM | POA: Diagnosis present

## 2020-12-03 DIAGNOSIS — Z87891 Personal history of nicotine dependence: Secondary | ICD-10-CM | POA: Diagnosis not present

## 2020-12-03 DIAGNOSIS — Z91013 Allergy to seafood: Secondary | ICD-10-CM | POA: Diagnosis not present

## 2020-12-03 DIAGNOSIS — J9601 Acute respiratory failure with hypoxia: Secondary | ICD-10-CM | POA: Diagnosis present

## 2020-12-03 DIAGNOSIS — K5732 Diverticulitis of large intestine without perforation or abscess without bleeding: Secondary | ICD-10-CM | POA: Diagnosis present

## 2020-12-03 DIAGNOSIS — B182 Chronic viral hepatitis C: Secondary | ICD-10-CM | POA: Diagnosis present

## 2020-12-03 DIAGNOSIS — A419 Sepsis, unspecified organism: Secondary | ICD-10-CM | POA: Diagnosis present

## 2020-12-03 DIAGNOSIS — Z833 Family history of diabetes mellitus: Secondary | ICD-10-CM | POA: Diagnosis not present

## 2020-12-03 DIAGNOSIS — E860 Dehydration: Secondary | ICD-10-CM | POA: Diagnosis present

## 2020-12-03 DIAGNOSIS — Z882 Allergy status to sulfonamides status: Secondary | ICD-10-CM | POA: Diagnosis not present

## 2020-12-03 DIAGNOSIS — K7460 Unspecified cirrhosis of liver: Secondary | ICD-10-CM | POA: Diagnosis present

## 2020-12-03 DIAGNOSIS — E669 Obesity, unspecified: Secondary | ICD-10-CM | POA: Diagnosis present

## 2020-12-03 DIAGNOSIS — Z8261 Family history of arthritis: Secondary | ICD-10-CM | POA: Diagnosis not present

## 2020-12-03 DIAGNOSIS — E872 Acidosis: Secondary | ICD-10-CM | POA: Diagnosis present

## 2020-12-03 DIAGNOSIS — E871 Hypo-osmolality and hyponatremia: Secondary | ICD-10-CM | POA: Diagnosis not present

## 2020-12-03 DIAGNOSIS — R509 Fever, unspecified: Secondary | ICD-10-CM | POA: Diagnosis not present

## 2020-12-03 DIAGNOSIS — Z20822 Contact with and (suspected) exposure to covid-19: Secondary | ICD-10-CM | POA: Diagnosis present

## 2020-12-03 DIAGNOSIS — Z885 Allergy status to narcotic agent status: Secondary | ICD-10-CM | POA: Diagnosis not present

## 2020-12-03 DIAGNOSIS — I10 Essential (primary) hypertension: Secondary | ICD-10-CM | POA: Diagnosis present

## 2020-12-03 DIAGNOSIS — H53122 Transient visual loss, left eye: Secondary | ICD-10-CM | POA: Diagnosis present

## 2020-12-03 DIAGNOSIS — Z801 Family history of malignant neoplasm of trachea, bronchus and lung: Secondary | ICD-10-CM | POA: Diagnosis not present

## 2020-12-03 DIAGNOSIS — H5462 Unqualified visual loss, left eye, normal vision right eye: Secondary | ICD-10-CM | POA: Diagnosis present

## 2020-12-03 DIAGNOSIS — Z803 Family history of malignant neoplasm of breast: Secondary | ICD-10-CM | POA: Diagnosis not present

## 2020-12-03 DIAGNOSIS — K8309 Other cholangitis: Secondary | ICD-10-CM | POA: Diagnosis present

## 2020-12-03 DIAGNOSIS — G9341 Metabolic encephalopathy: Secondary | ICD-10-CM | POA: Diagnosis present

## 2020-12-03 DIAGNOSIS — Z6830 Body mass index (BMI) 30.0-30.9, adult: Secondary | ICD-10-CM | POA: Diagnosis not present

## 2020-12-03 LAB — I-STAT ARTERIAL BLOOD GAS, ED
Acid-base deficit: 3 mmol/L — ABNORMAL HIGH (ref 0.0–2.0)
Acid-base deficit: 2 mmol/L (ref 0.0–2.0)
Bicarbonate: 19.9 mmol/L — ABNORMAL LOW (ref 20.0–28.0)
Bicarbonate: 20 mmol/L (ref 20.0–28.0)
Calcium, Ion: 1.13 mmol/L — ABNORMAL LOW (ref 1.15–1.40)
Calcium, Ion: 1.11 mmol/L — ABNORMAL LOW (ref 1.15–1.40)
HCT: 31 % — ABNORMAL LOW (ref 36.0–46.0)
HCT: 34 % — ABNORMAL LOW (ref 36.0–46.0)
Hemoglobin: 10.5 g/dL — ABNORMAL LOW (ref 12.0–15.0)
Hemoglobin: 11.6 g/dL — ABNORMAL LOW (ref 12.0–15.0)
O2 Saturation: 95 %
O2 Saturation: 97 %
Patient temperature: 104.2
Patient temperature: 104.2
Potassium: 3.1 mmol/L — ABNORMAL LOW (ref 3.5–5.1)
Potassium: 2.8 mmol/L — ABNORMAL LOW (ref 3.5–5.1)
Sodium: 134 mmol/L — ABNORMAL LOW (ref 135–145)
Sodium: 133 mmol/L — ABNORMAL LOW (ref 135–145)
TCO2: 21 mmol/L — ABNORMAL LOW (ref 22–32)
TCO2: 21 mmol/L — ABNORMAL LOW (ref 22–32)
pCO2 arterial: 30.8 mmHg — ABNORMAL LOW (ref 32.0–48.0)
pCO2 arterial: 29.8 mmHg — ABNORMAL LOW (ref 32.0–48.0)
pH, Arterial: 7.43 (ref 7.350–7.450)
pH, Arterial: 7.447 (ref 7.350–7.450)
pO2, Arterial: 87 mmHg (ref 83.0–108.0)
pO2, Arterial: 98 mmHg (ref 83.0–108.0)

## 2020-12-03 LAB — LACTIC ACID, PLASMA
Lactic Acid, Venous: 2.2 mmol/L (ref 0.5–1.9)
Lactic Acid, Venous: 1.4 mmol/L (ref 0.5–1.9)

## 2020-12-03 LAB — CBC
HCT: 30.5 % — ABNORMAL LOW (ref 36.0–46.0)
Hemoglobin: 10.2 g/dL — ABNORMAL LOW (ref 12.0–15.0)
MCH: 28.1 pg (ref 26.0–34.0)
MCHC: 33.4 g/dL (ref 30.0–36.0)
MCV: 84 fL (ref 80.0–100.0)
Platelets: 144 10*3/uL — ABNORMAL LOW (ref 150–400)
RBC: 3.63 MIL/uL — ABNORMAL LOW (ref 3.87–5.11)
RDW: 15.2 % (ref 11.5–15.5)
WBC: 5.1 10*3/uL (ref 4.0–10.5)
nRBC: 0 % (ref 0.0–0.2)

## 2020-12-03 LAB — CK: Total CK: 1075 U/L — ABNORMAL HIGH (ref 38–234)

## 2020-12-03 LAB — COMPREHENSIVE METABOLIC PANEL
ALT: 101 U/L — ABNORMAL HIGH (ref 0–44)
AST: 205 U/L — ABNORMAL HIGH (ref 15–41)
Albumin: 2.5 g/dL — ABNORMAL LOW (ref 3.5–5.0)
Alkaline Phosphatase: 92 U/L (ref 38–126)
Anion gap: 11 (ref 5–15)
BUN: 21 mg/dL (ref 8–23)
CO2: 19 mmol/L — ABNORMAL LOW (ref 22–32)
Calcium: 8 mg/dL — ABNORMAL LOW (ref 8.9–10.3)
Chloride: 100 mmol/L (ref 98–111)
Creatinine, Ser: 1.11 mg/dL — ABNORMAL HIGH (ref 0.44–1.00)
GFR, Estimated: 53 mL/min — ABNORMAL LOW (ref >60)
Glucose, Bld: 129 mg/dL — ABNORMAL HIGH (ref 70–99)
Potassium: 2.9 mmol/L — ABNORMAL LOW (ref 3.5–5.1)
Sodium: 130 mmol/L — ABNORMAL LOW (ref 135–145)
Total Bilirubin: 1.5 mg/dL — ABNORMAL HIGH (ref 0.3–1.2)
Total Protein: 6.3 g/dL — ABNORMAL LOW (ref 6.5–8.1)

## 2020-12-03 LAB — GLUCOSE, CAPILLARY
Glucose-Capillary: 113 mg/dL — ABNORMAL HIGH (ref 70–99)
Glucose-Capillary: 111 mg/dL — ABNORMAL HIGH (ref 70–99)
Glucose-Capillary: 101 mg/dL — ABNORMAL HIGH (ref 70–99)
Glucose-Capillary: 100 mg/dL — ABNORMAL HIGH (ref 70–99)
Glucose-Capillary: 77 mg/dL (ref 70–99)

## 2020-12-03 LAB — MAGNESIUM: Magnesium: 2 mg/dL (ref 1.7–2.4)

## 2020-12-03 LAB — TSH: TSH: 1.277 u[IU]/mL (ref 0.350–4.500)

## 2020-12-03 LAB — PROCALCITONIN: Procalcitonin: 9.18 ng/mL

## 2020-12-03 LAB — PROTIME-INR
INR: 1.5 — ABNORMAL HIGH (ref 0.8–1.2)
Prothrombin Time: 17.6 seconds — ABNORMAL HIGH (ref 11.4–15.2)

## 2020-12-03 LAB — MRSA NEXT GEN BY PCR, NASAL: MRSA by PCR Next Gen: NOT DETECTED

## 2020-12-03 LAB — AMMONIA
Ammonia: 15 umol/L (ref 9–35)
Ammonia: 27 umol/L (ref 9–35)

## 2020-12-03 LAB — CORTISOL-AM, BLOOD: Cortisol - AM: 27.1 ug/dL — ABNORMAL HIGH (ref 6.7–22.6)

## 2020-12-03 MED ORDER — ENOXAPARIN SODIUM 40 MG/0.4ML IJ SOSY
40.0000 mg | PREFILLED_SYRINGE | Freq: Every day | INTRAMUSCULAR | Status: DC
  Administered 2020-12-03 – 2020-12-10 (×9): 40 mg via SUBCUTANEOUS
  Filled 2020-12-03 (×9): qty 0.4

## 2020-12-03 MED ORDER — IOHEXOL 300 MG/ML  SOLN
100.0000 mL | Freq: Once | INTRAMUSCULAR | Status: AC | PRN
  Administered 2020-12-03: 100 mL via INTRAVENOUS

## 2020-12-03 MED ORDER — METRONIDAZOLE 500 MG/100ML IV SOLN
500.0000 mg | Freq: Three times a day (TID) | INTRAVENOUS | Status: DC
  Filled 2020-12-03: qty 100

## 2020-12-03 MED ORDER — CHLORHEXIDINE GLUCONATE CLOTH 2 % EX PADS
6.0000 | MEDICATED_PAD | Freq: Every day | CUTANEOUS | Status: DC
  Administered 2020-12-03 – 2020-12-10 (×8): 6 via TOPICAL

## 2020-12-03 MED ORDER — POTASSIUM CHLORIDE 10 MEQ/100ML IV SOLN
10.0000 meq | INTRAVENOUS | Status: AC
  Administered 2020-12-03 (×2): 10 meq via INTRAVENOUS
  Filled 2020-12-03 (×2): qty 100

## 2020-12-03 MED ORDER — PROPOFOL 1000 MG/100ML IV EMUL
5.0000 ug/kg/min | INTRAVENOUS | Status: DC
  Filled 2020-12-03: qty 100

## 2020-12-03 MED ORDER — POTASSIUM CHLORIDE 20 MEQ PO PACK
40.0000 meq | PACK | Freq: Once | ORAL | Status: AC
  Administered 2020-12-03: 40 meq
  Filled 2020-12-03: qty 2

## 2020-12-03 MED ORDER — ONDANSETRON HCL 4 MG PO TABS: 4.0000 mg | ORAL_TABLET | Freq: Four times a day (QID) | ORAL | Status: DC | PRN

## 2020-12-03 MED ORDER — ACETAMINOPHEN 650 MG RE SUPP
650.0000 mg | Freq: Once | RECTAL | Status: AC
  Administered 2020-12-03: 650 mg via RECTAL
  Filled 2020-12-03: qty 1

## 2020-12-03 MED ORDER — LACTATED RINGERS IV SOLN: INTRAVENOUS | Status: AC

## 2020-12-03 MED ORDER — FENTANYL BOLUS VIA INFUSION
50.0000 ug | INTRAVENOUS | Status: DC | PRN
  Filled 2020-12-03: qty 50

## 2020-12-03 MED ORDER — POTASSIUM CHLORIDE 10 MEQ/100ML IV SOLN
10.0000 meq | INTRAVENOUS | Status: AC
  Filled 2020-12-03: qty 100

## 2020-12-03 MED ORDER — ACETAMINOPHEN 500 MG PO TABS: 500.0000 mg | ORAL_TABLET | Freq: Four times a day (QID) | ORAL | Status: DC | PRN

## 2020-12-03 MED ORDER — FENTANYL 2500MCG IN NS 250ML (10MCG/ML) PREMIX INFUSION
0.0000 ug/h | INTRAVENOUS | Status: DC
  Administered 2020-12-03: 25 ug/h via INTRAVENOUS
  Administered 2020-12-03: 100 ug/h via INTRAVENOUS
  Filled 2020-12-03 (×2): qty 250

## 2020-12-03 MED ORDER — CHLORHEXIDINE GLUCONATE 0.12% ORAL RINSE (MEDLINE KIT)
15.0000 mL | Freq: Two times a day (BID) | OROMUCOSAL | Status: DC
  Administered 2020-12-03 – 2020-12-04 (×4): 15 mL via OROMUCOSAL

## 2020-12-03 MED ORDER — METRONIDAZOLE 500 MG/100ML IV SOLN
500.0000 mg | Freq: Once | INTRAVENOUS | Status: AC
  Administered 2020-12-03: 500 mg via INTRAVENOUS
  Filled 2020-12-03: qty 100

## 2020-12-03 MED ORDER — ACETAMINOPHEN 650 MG RE SUPP: 650.0000 mg | Freq: Four times a day (QID) | RECTAL | Status: DC | PRN

## 2020-12-03 MED ORDER — ONDANSETRON HCL 4 MG/2ML IJ SOLN
4.0000 mg | Freq: Four times a day (QID) | INTRAMUSCULAR | Status: DC | PRN
  Administered 2020-12-03: 4 mg via INTRAVENOUS
  Filled 2020-12-03: qty 2

## 2020-12-03 MED ORDER — ONDANSETRON HCL 4 MG PO TABS
4.0000 mg | ORAL_TABLET | Freq: Four times a day (QID) | ORAL | Status: DC | PRN
  Administered 2020-12-07 – 2020-12-11 (×6): 4 mg
  Filled 2020-12-03 (×6): qty 1

## 2020-12-03 MED ORDER — LABETALOL HCL 5 MG/ML IV SOLN: 10.0000 mg | INTRAVENOUS | Status: DC | PRN

## 2020-12-03 MED ORDER — FENTANYL CITRATE (PF) 100 MCG/2ML IJ SOLN
100.0000 ug | Freq: Once | INTRAMUSCULAR | Status: AC
  Administered 2020-12-03: 100 ug via INTRAVENOUS
  Filled 2020-12-03: qty 2

## 2020-12-03 MED ORDER — ONDANSETRON HCL 4 MG/2ML IJ SOLN
4.0000 mg | Freq: Four times a day (QID) | INTRAMUSCULAR | Status: DC | PRN
  Administered 2020-12-06 – 2020-12-09 (×7): 4 mg via INTRAVENOUS
  Filled 2020-12-03 (×8): qty 2

## 2020-12-03 MED ORDER — ETOMIDATE 2 MG/ML IV SOLN
25.0000 mg | Freq: Once | INTRAVENOUS | Status: AC
  Administered 2020-12-03: 25 mg via INTRAVENOUS
  Filled 2020-12-03: qty 20

## 2020-12-03 MED ORDER — KETOROLAC TROMETHAMINE 30 MG/ML IJ SOLN
30.0000 mg | Freq: Once | INTRAMUSCULAR | Status: AC
  Administered 2020-12-03: 30 mg via INTRAVENOUS
  Filled 2020-12-03: qty 1

## 2020-12-03 MED ORDER — LACTATED RINGERS IV BOLUS
500.0000 mL | Freq: Once | INTRAVENOUS | Status: AC
  Administered 2020-12-03: 500 mL via INTRAVENOUS

## 2020-12-03 MED ORDER — POTASSIUM CHLORIDE 10 MEQ/100ML IV SOLN
10.0000 meq | INTRAVENOUS | Status: AC
  Administered 2020-12-03 (×2): 10 meq via INTRAVENOUS
  Filled 2020-12-03: qty 100

## 2020-12-03 MED ORDER — PIPERACILLIN-TAZOBACTAM 3.375 G IVPB
3.3750 g | Freq: Three times a day (TID) | INTRAVENOUS | Status: DC
  Administered 2020-12-03 – 2020-12-04 (×4): 3.375 g via INTRAVENOUS
  Filled 2020-12-03 (×4): qty 50

## 2020-12-03 MED ORDER — ACETAMINOPHEN 325 MG PO TABS
650.0000 mg | ORAL_TABLET | Freq: Four times a day (QID) | ORAL | Status: DC | PRN
  Administered 2020-12-03 – 2020-12-09 (×6): 650 mg
  Filled 2020-12-03 (×5): qty 2

## 2020-12-03 MED ORDER — ACETAMINOPHEN 650 MG RE SUPP
650.0000 mg | Freq: Four times a day (QID) | RECTAL | Status: DC | PRN
  Filled 2020-12-03: qty 1

## 2020-12-03 MED ORDER — ORAL CARE MOUTH RINSE
15.0000 mL | OROMUCOSAL | Status: DC
  Administered 2020-12-03 – 2020-12-04 (×18): 15 mL via OROMUCOSAL

## 2020-12-03 MED ORDER — ROCURONIUM BROMIDE 10 MG/ML (PF) SYRINGE
75.0000 mg | PREFILLED_SYRINGE | Freq: Once | INTRAVENOUS | Status: AC
  Administered 2020-12-03: 75 mg via INTRAVENOUS
  Filled 2020-12-03: qty 10

## 2020-12-03 MED ORDER — FAMOTIDINE IN NACL 20-0.9 MG/50ML-% IV SOLN
20.0000 mg | INTRAVENOUS | Status: DC
  Administered 2020-12-03 – 2020-12-08 (×6): 20 mg via INTRAVENOUS
  Filled 2020-12-03 (×7): qty 50

## 2020-12-03 MED ORDER — ACETAMINOPHEN 325 MG PO TABS
650.0000 mg | ORAL_TABLET | Freq: Four times a day (QID) | ORAL | Status: DC | PRN
  Administered 2020-12-03: 650 mg via ORAL
  Filled 2020-12-03: qty 2

## 2020-12-03 MED ORDER — SODIUM CHLORIDE 0.9 % IV SOLN
INTRAVENOUS | Status: DC | PRN
  Administered 2020-12-03: 1000 mL via INTRAVENOUS

## 2020-12-03 NOTE — H&P (Addendum)
NAME:  Angela BlightSheila Drolet, MRN:  [id number], DOB:  [birth year], LOS: 0 ADMISSION DATE:  12/02/2020, CONSULTATION DATE:  12/03/20 REFERRING MD:  Denton LankSteinl, CHIEF COMPLAINT:  Sepsis  History of Present Illness:  This is a 75 yo female with history of HTN and hepatitis C who presents with several days of nausea and vomiting.  Had been having this for several days. AT first patient thought it was COVID and was trying to distance herself. She was also taking several over the counter medications.  Home COVID testing taken at home noted to be negative.  Patient had recently been prescribed Macrobid for UTI.  Son came in to check on her today. HE noted that she was breathing fast and not as with it as usual. Given this he called EMS. EMS transferred to the ED. IN the ED she had a wokrup that was pertinetn for the below:   CMP with levated creainie of 1.11, AST of 201, and ALT of 112, UA with small amount of blood. LA of 1.7. Respiratory panel that was negative.  Had been getting treatment for UTI on outpatient.  T-max of 104.6.  Per son patient not a drug user or etoh user.   Pertinent  Medical History  HTN Depression Hepatitis C IBS UTI Cataracts Arthritis  Significant Hospital Events: Including procedures, antibiotic start and stop dates in addition to other pertinent events   Patient intubated and admitted ot the ICU on 12/03/20  Interim History / Subjective:  NA  Objective   Blood pressure (!) 168/69, pulse (!) 101, temperature (!) 104.2 F (40.1 C), temperature source Rectal, resp. rate (!) 37, height 5\' 7"  (1.702 m), weight 85 kg, SpO2 100 %.    FiO2 (%):  [21 %-30 %] 30 %   Intake/Output Summary (Last 24 hours) at 12/03/2020 0125 Last data filed at 12/02/2020 2222 Gross per 24 hour  Intake 1165.5 ml  Output --  Net 1165.5 ml   Filed Weights   12/02/20 2201  Weight: 85 kg    Examination: General: Patient alert but confused unable to consistently follow commands HENT: Mucous  membranes appear dry Lungs: Clear to auscultation.  Patient tachypneic with accessory muscle use.  No wheezes or rhonchi Cardiovascular: Tachycardic but regular Abdomen: Soft without guarding or distention Extremities: No deformities appreciated Neuro: No focal deficits noted however patient unable to interact for full neurological examination GU: Deferred  Labs/imaging that I havepersonally reviewed  (right click and "Reselect all SmartList Selections" daily)   CXR as noted below 12/03/20    IMPRESSION: No active disease.  Resolved Hospital Problem list   Not applicable  Assessment & Plan:  This is a [age] with history of HTN who presents for chief complaint of rapid breathing found to be in spesis  Sepsis-etiology unclear-With history on N/V for several days worried and LFT's elevated cholangitis vs cystits is high on differential -BCX, UCX  -Broad spectrum abx with vanc and zosyn -Trend lactic acids -Respiratory culture -CT head CT chest CT abdomen CT pelvis -GIP panel if patient noted to have diarrhea -Tylenol 500 mg every 6 as needed attempting to limit dose given mild elevation in LFTs and concerned that it may continue to rise.  Impending respiratory failure-patient tachypneic and and is obviously fatigued -Patient with normalizing CO2 and pH concern for impending respiratory failure -Intubated in ED -Plateau pressure less than 25 -Driving pressure less than 15 -6 to 8 cc/kg -Set respiratory rate to match patient which would at least be 30  AKI-patient with creatinine elevated at 1.11 from previously noted creatinine of 0.73 was given Toradol in the ED by previous providers for fever control -Avoid nephrotoxins -Renally dose medications -Place foley for accurate I/O's  Encephalopathy-patient was denying nuchal rigidity on admission.  Additionally was more alert.  Think that this is likely secondary to some intra-abdominal process and metabolic encephalopathy.  However  if CT scans unremarkable may have to consider primary CNS infection -CT head -Cultures as noted above -TSH -Ammonia  Hypertension-hold home Norvasc and hydrochlorothiazide.  As concerned that patient may progress to septic shock. -We will put in for as needed labetalol if patient should become hypertensive        Best practice (right click and "Reselect all SmartList Selections" daily)  Diet:  NPO Pain/Anxiety/Delirium protocol (if indicated): Yes (RASS goal -2) VAP protocol (if indicated): Yes DVT prophylaxis: LMWH GI prophylaxis: H2B Glucose control:  SSI Yes Central venous access:  N/A Arterial line:  N/A Foley:  Yes, and it is still needed Mobility:  bed rest  PT consulted: N/A Last date of multidisciplinary goals of care discussion [12/03/20 with son Bufford Lope who can be reached at the following [phone]] Code Status:  full code Disposition: ICU  Labs   CBC: Recent Labs  Lab 12/02/20 2054 12/03/20 0055  WBC 7.1  --   NEUTROABS 6.2  --   HGB 12.0 10.5*  HCT 36.3 31.0*  MCV 84.6  --   PLT 181  --     Basic Metabolic Panel: Recent Labs  Lab 12/02/20 2054 12/03/20 0055  NA 131* 134*  K 3.2* 3.1*  CL 99  --   CO2 19*  --   GLUCOSE 127*  --   BUN 23  --   CREATININE 1.11*  --   CALCIUM 8.3*  --    GFR: Estimated Creatinine Clearance: 51.3 mL/min (A) (by C-G formula based on SCr of 1.11 mg/dL (H)). Recent Labs  Lab 12/02/20 2054 12/02/20 2225  WBC 7.1  --   LATICACIDVEN  --  1.7    Liver Function Tests: Recent Labs  Lab 12/02/20 2054  AST 201*  ALT 112*  ALKPHOS 102  BILITOT 1.2  PROT 7.4  ALBUMIN 3.0*   No results for input(s): LIPASE, AMYLASE in the last 168 hours. No results for input(s): AMMONIA in the last 168 hours.  ABG    Component Value Date/Time   PHART 7.430 12/03/2020 0055   PCO2ART 30.8 (L) 12/03/2020 0055   PO2ART 87 12/03/2020 0055   HCO3 19.9 (L) 12/03/2020 0055   TCO2 21 (L) 12/03/2020 0055    ACIDBASEDEF 3.0 (H) 12/03/2020 0055   O2SAT 95.0 12/03/2020 0055     Coagulation Profile: No results for input(s): INR, PROTIME in the last 168 hours.  Cardiac Enzymes: No results for input(s): CKTOTAL, CKMB, CKMBINDEX, TROPONINI in the last 168 hours.  HbA1C: Hgb A1c MFr Bld  Date/Time Value Ref Range Status  11/10/2020 09:27 AM 6.0 4.6 - 6.5 % Final    Comment:    Glycemic Control Guidelines for People with Diabetes:Non Diabetic:  <6%Goal of Therapy: <7%Additional Action Suggested:  >8%   02/19/2019 01:28 PM 5.9 4.6 - 6.5 % Final    Comment:    Glycemic Control Guidelines for People with Diabetes:Non Diabetic:  <6%Goal of Therapy: <7%Additional Action Suggested:  >8%     CBG: No results for input(s): GLUCAP in the last 168 hours.  Review of Systems:   Pertinent positives and negatives per  HPI. Patient unable to perform complete ROS given AMS  Past Medical History:  She,  has a past medical history of Anxiety, Arnold-Chiari malformation (HCC), Arthritis, Cataract, Cirrhosis (HCC), Colon polyps, Depression, Diverticulitis, Diverticulosis, Headache, Hepatitis C, Hypertension, IBS (irritable bowel syndrome), and UTI (urinary tract infection).   Surgical History:   Past Surgical History:  Procedure Laterality Date   BRAIN SURGERY  2003   decompression   BREAST EXCISIONAL BIOPSY Bilateral    age 67's   DILATION AND CURETTAGE OF UTERUS     OVARIAN CYST REMOVAL Bilateral    REDUCTION MAMMAPLASTY Bilateral    25+ years ago     Social History:   reports that she quit smoking about 51 years ago. Her smoking use included cigarettes. She has never used smokeless tobacco. She reports current alcohol use. She reports that she does not use drugs.   Family History:  Her family history includes Alcohol abuse in her father; Arthritis in her father and mother; Breast cancer in her paternal aunt, paternal aunt, and paternal grandmother; Diabetes in her maternal aunt, maternal  grandmother, maternal uncle, and mother; Hypertension in her daughter, mother, and sister; Kidney disease in her mother; Kidney failure in her maternal aunt; Lung cancer in her father; Parkinson's disease in her son; Rheum arthritis in her sister.   Allergies Allergies  Allergen Reactions   Crab (Diagnostic) Hives   Morphine And Related Nausea And Vomiting   Sulfa Antibiotics Nausea And Vomiting     Home Medications  Prior to Admission medications   Medication Sig Start Date End Date Taking? Authorizing Provider  Acetaminophen-Caffeine 500-65 MG TABS Take 1 tablet by mouth every 6 (six) hours as needed (headache). Tension relief   Yes [provider]  amLODipine (NORVASC) 10 MG tablet Take 1 tablet (10 mg total) by mouth daily. Patient taking differently: Take 10 mg by mouth daily as needed (when she doesn't feel good). 08/24/20  Yes Myrlene Broker, MD  BIOTIN PO Take 1 tablet by mouth daily.   Yes [provider]  cetirizine (ZYRTEC) 10 MG tablet Take 10 mg by mouth daily as needed for allergies or rhinitis.   Yes [provider]  etodolac (LODINE) 200 MG capsule Take 1 capsule (200 mg total) by mouth 2 (two) times daily. Patient taking differently: Take 200 mg by mouth 2 (two) times daily as needed (pain). 08/24/20  Yes Myrlene Broker, MD  Ginger, Zingiber officinalis, (GINGER ROOT PO) Take 1 tablet by mouth daily as needed (immune system boost).   Yes [provider]  guaifenesin (HUMIBID E) 400 MG TABS tablet Take 400 mg by mouth every 4 (four) hours as needed (congestion).   Yes [provider]  hydrochlorothiazide (HYDRODIURIL) 25 MG tablet Take 1 tablet (25 mg total) by mouth daily. 08/24/20  Yes Myrlene Broker, MD  ibuprofen (ADVIL) 800 MG tablet Take 1 tablet (800 mg total) by mouth 3 (three) times daily. Patient taking differently: Take 800 mg by mouth 3 (three) times daily as needed (pain). 08/31/20  Yes Felecia Shelling, DPM   linaclotide (LINZESS) 72 MCG capsule TAKE 1 CAPSULE(72 MCG) BY MOUTH DAILY BEFORE BREAKFAST Patient taking differently: Take 72 mcg by mouth daily as needed (constipation). 11/10/20  Yes Myrlene Broker, MD  meloxicam (MOBIC) 15 MG tablet Take 15 mg by mouth daily as needed for pain. 09/22/20  Yes [provider]  Multiple Vitamins-Minerals (ZINC) LOZG Take 1 lozenge by mouth daily as needed (immune system  boost).   Yes [provider]  ondansetron (ZOFRAN) 4 MG tablet Take 1 tablet (4 mg total) by mouth every 8 (eight) hours as needed for nausea or vomiting. 08/31/20  Yes Felecia Shelling, DPM  Phenylephrine-DM-GG-APAP (COLD & FLU SEVERE DAYTIME) 5-10-200-325 MG TABS Take 1 tablet by mouth daily as needed (cough/cold symptoms).   Yes [provider]  Tetrahydroz-Dextran-PEG-Povid (ADVANCED FORMULA EYE DROPS) 0.05-0.1-1-1 % SOLN Place 1 drop into both eyes daily.   Yes [provider]  traMADol (ULTRAM) 50 MG tablet Take 50 mg by mouth every 6 (six) hours as needed (pain).   Yes [provider]     Critical care time:55 minutes

## 2020-12-03 NOTE — Progress Notes (Signed)
Patient transported from ED Room 22 to 3M14 with no complications.

## 2020-12-03 NOTE — Progress Notes (Signed)
eLink Physician-Brief Progress Note Patient Name: Angela Ramirez DOB: [birth year] MRN: [id number]   Date of Service  12/03/2020  HPI/Events of Note  RN requests PRN fentanyl bolus from infusion, in addition to existing fentanyl infusion order.   eICU Interventions  Added fentanyl 50 mcg IV bolus from infusion Q1H PRN breakthrough pain.     Intervention Category Intermediate Interventions: Pain - evaluation and management  Janae Bridgeman 12/03/2020, 11:37 PM

## 2020-12-03 NOTE — ED Notes (Signed)
Increased respiratory distress noted. MD and RT made aware.

## 2020-12-03 NOTE — Procedures (Signed)
Intubation Procedure Note  Angela Ramirez  [id number]  August 20, 1945  Date:12/03/20  Time:2:16 AM   Provider Performing:Joniqua Sidle E Mikki Harbor    Procedure: Intubation (31500)  Indication(s) Respiratory Failure  Consent Risks of the procedure as well as the alternatives and risks of each were explained to the patient and/or caregiver.  Consent for the procedure was obtained and is signed in the bedside chart   Anesthesia Etomidate, Fentanyl, and Rocuronium   Time Out Verified patient identification, verified procedure, site/side was marked, verified correct patient position, special equipment/implants available, medications/allergies/relevant history reviewed, required imaging and test results available.   Sterile Technique Usual hand hygeine, masks, and gloves were used   Procedure Description Patient positioned in bed supine.  Sedation given as noted above.  Patient was intubated with endotracheal tube using Glidescope.  View was Grade 1 full glottis .  Number of attempts was 1.  Colorimetric CO2 detector was consistent with tracheal placement.   Complications/Tolerance None; patient tolerated the procedure well. Chest X-ray is ordered to verify placement.   EBL Minimal   Specimen(s) None

## 2020-12-03 NOTE — Progress Notes (Signed)
Pharmacy Antibiotic Note  Angela Ramirez is a 75 y.o. female admitted on 12/02/2020 with  intra-abdominal infection .  Pharmacy has been consulted to change cefepime to Zosyn.  Plan: Zosyn 3.375g IV q8h (4 hour infusion).  Height: 5\' 7"  (170.2 cm) Weight: 85 kg (187 lb 6.3 oz) IBW/kg (Calculated) : 61.6  Temp (24hrs), Avg:102.4 F (39.1 C), Min:98.5 F (36.9 C), Max:104.6 F (40.3 C)  Recent Labs  Lab 12/02/20 2054 12/02/20 2225  WBC 7.1  --   CREATININE 1.11*  --   LATICACIDVEN  --  1.7    Estimated Creatinine Clearance: 51.3 mL/min (A) (by C-G formula based on SCr of 1.11 mg/dL (H)).    Allergies  Allergen Reactions   Crab (Diagnostic) Hives   Morphine And Related Nausea And Vomiting   Sulfa Antibiotics Nausea And Vomiting    Thank you for allowing pharmacy to be a part of this patient's care.  02/01/21, PharmD, BCPS  12/03/2020 1:57 AM

## 2020-12-03 NOTE — Progress Notes (Signed)
eLink Physician-Brief Progress Note Patient Name: Angela Ramirez DOB: [birth year] MRN: [id number]   Date of Service  12/03/2020  HPI/Events of Note  72F with HTN and HCV who p/w nausea and vomiting, subsequently diagnosed with sepsis of unclear source (? Acute cholangitis, ? Diverticulitis). Febrile to 104.2 F. Patient required intubation in the ED for work of breathing.  Labs notable for : - K 3.2 - HCO3 19 - Cr 1.11 (baseline 0.75) - AST 201 / ALT 112 (previously normal 10/2020) - CK 1,075 - lactate 1.67 -> 2.2  CT Abd/Pelvis showed: IMPRESSION: 1. Some dependent areas of bandlike opacity favoring atelectatic change with trace left pleural effusion. No other consolidative process or CT evidence of edema. 2. Extensive distal colonic diverticulosis with a subtle focus of peridiverticular inflammation in the proximal sigmoid, could reflect early or developing diverticulitis in the appropriate clinical setting. No extraluminal gas, organized collection or abscess. 3. Nonspecific mosaic attenuation in the lungs, could reflect underlying air trapping or small airways disease. 4. Stigmata of hepatic cirrhosis.  No concerning focal liver lesion. 5. Appropriate positioning of the transesophageal tube.  On exam, HR 83 bpm, BP 132/59 (80), RR 18, SpO2 99% on vent.   eICU Interventions  # Neuro: - Propofol/Fentanyl drips for sedation/analgesia - APAP for fever PRN  # Cardiac: - S/p fluid resuscitation in ED   # Pulmonary: - Decrease Set RR given that patient's last ABG was slightly alkalotic. Vent Mode: PRVC FiO2 (%):  [21 %-30 %] 30 % Set Rate:  [18 bmp-24 bmp] 18 bmp Vt Set:  [490 mL] 490 mL PEEP:  [5 cmH20] 5 cmH20 Plateau Pressure:  [17 cmH20] 17 cmH20  - Given discrepancy between CT Chest and CXR reports of ETT position, will repeat CXR now to confirm position.  # GI: - Transaminitis: Repeat LFTs in AM.  # ID: - Unclear source of infection - F/u Bcx, respiratory cx - Empiric  vanc/zosyn for now  # Renal: - AKI: S/p fluid resuscitation in ED.  - Hypokalemia: Cautious repletion in setting of AKI. F/u AM BMP.  DVT PPX: Lovenox 40 [place] daily GI PPX: Pepcid     Intervention Category Evaluation Type: New Patient Evaluation  [name] 12/03/2020, 4:00 AM

## 2020-12-03 NOTE — Progress Notes (Addendum)
NAME:  Angela Ramirez, MRN:  [id number], DOB:  1945-07-26, LOS: 0 ADMISSION DATE:  12/02/2020, CONSULTATION DATE:  12/03/20 REFERRING MD:  Denton Lank, CHIEF COMPLAINT:  Sepsis  History of Present Illness:  This is a 75 yo female with history of HTN and hepatitis C who presents with several days of nausea and vomiting.  Home COVID test negative.  Patient had been taking Macrobid recently for UTI.  Brought to the ICU for dyspnea, tachypnea.  Found to have sepsis with fevers of 104.6, AKI, elevated lactic acid.    Per son patient not a drug user or etoh user.   Pertinent  Medical History  HTN Depression Hepatitis C IBS UTI Cataracts Arthritis  Significant Hospital Events: Including procedures, antibiotic start and stop dates in addition to other pertinent events   6/11 Patient intubated and admitted to the ICU  Interim History / Subjective:   Remains on the ventilator.  Has been hemodynamically stable.  Does not require pressors   Objective   Blood pressure (!) 127/59, pulse 95, temperature 99.32 F (37.4 C), resp. rate (!) 35, height 5\' 7"  (1.702 m), weight 85 kg, SpO2 100 %.    Vent Mode: PRVC FiO2 (%):  [21 %-35 %] 30 % Set Rate:  [18 bmp-24 bmp] 18 bmp Vt Set:  [490 mL] 490 mL PEEP:  [5 cmH20] 5 cmH20 Plateau Pressure:  [17 cmH20-18 cmH20] 18 cmH20   Intake/Output Summary (Last 24 hours) at 12/03/2020 0745 Last data filed at 12/03/2020 0700 Gross per 24 hour  Intake 3566.62 ml  Output 30 ml  Net 3536.62 ml   Filed Weights   12/02/20 2201  Weight: 85 kg    Examination: Gen:      No acute distress HEENT:  EOMI, sclera anicteric Neck:     No masses; no thyromegaly, ET tube Lungs:    Clear to auscultation bilaterally; normal respiratory effort CV:         Regular rate and rhythm; no murmurs Abd:      + bowel sounds; soft, non-tender; no palpable masses, no distension Ext:    No edema; adequate peripheral perfusion Skin:      Warm and dry; no rash Neuro: Awake,  communicative and alert on the ventilator  Labs/imaging that I havepersonally reviewed  (right click and "Reselect all SmartList Selections" daily)   CT chest abdomen pelvis 6/12-atelectatic changes in the lungs, colonic diverticulosis with possible early diverticulitis, cirrhosis with possible early developing diverticulitis, air trapping, hepatic cirrhosis.  Sodium 130, potassium 2.9, creatinine 1.11, AST 205, ALT 101, PCT 9.18 WBC 5.1, hemoglobin 10.2, platelets 144  Resolved Hospital Problem list   Not applicable  Assessment & Plan:  This is a 75 with history of HTN who presents for chief complaint of rapid breathing found to be in spesis  Severe sepsis present on admission Etiology is UTI versus early developing diverticulitis Follow cultures, Continue zosyn. Can DC flagyl as zosyn has adequate anaerobe coverage, DC vanco as MRSA swab os negative IV fluids  Elevated LFTs Likely secondary to sepsis in the setting of baseline cirrhosis.  Continue monitoring CT abdomen does not show any gallbladder pathology.  Continue monitoring labs  AKI Follow urine output and creatinine  Impending respiratory failure-patient tachypneic and and is obviously fatigued -Patient with normalizing CO2 and pH concern for impending respiratory failure -Intubated in ED -Plateau pressure less than 25 -Driving pressure less than 15 -6 to 8 cc/kg -Set respiratory rate to match patient which would at least  be 30  Hypertension Hold home blood pressure medication  Best practice (right click and "Reselect all SmartList Selections" daily)  Diet:  NPO Pain/Anxiety/Delirium protocol (if indicated): Yes (RASS goal -2) VAP protocol (if indicated): Yes DVT prophylaxis: LMWH GI prophylaxis: H2B Glucose control:  SSI Yes Central venous access:  N/A Arterial line:  N/A Foley:  Yes, and it is still needed Mobility:  bed rest  PT consulted: N/A Last date of multidisciplinary goals of care discussion  [12/03/20 with son Bufford Lope who can be reached at the following number-[phone]] Code Status:  full code Disposition: ICU  Critical care time:   The patient is critically ill with multiple organ system failure and requires high complexity decision making for assessment and support, frequent evaluation and titration of therapies, advanced monitoring, review of radiographic studies and interpretation of complex data.   Critical Care Time devoted to patient care services, exclusive of separately billable procedures, described in this note is 45 minutes.   Chilton Greathouse MD [facility] See Amion for pager  If no response to pager , please call [phone] until 7pm After 7:00 pm call Elink  [phone] 12/03/2020, 7:45 AM

## 2020-12-04 ENCOUNTER — Inpatient Hospital Stay (HOSPITAL_COMMUNITY): Payer: PPO

## 2020-12-04 LAB — BASIC METABOLIC PANEL
Anion gap: 11 (ref 5–15)
BUN: 26 mg/dL — ABNORMAL HIGH (ref 8–23)
CO2: 20 mmol/L — ABNORMAL LOW (ref 22–32)
Calcium: 8.3 mg/dL — ABNORMAL LOW (ref 8.9–10.3)
Chloride: 102 mmol/L (ref 98–111)
Creatinine, Ser: 1.27 mg/dL — ABNORMAL HIGH (ref 0.44–1.00)
GFR, Estimated: 45 mL/min — ABNORMAL LOW (ref >60)
Glucose, Bld: 79 mg/dL (ref 70–99)
Potassium: 4.2 mmol/L (ref 3.5–5.1)
Sodium: 133 mmol/L — ABNORMAL LOW (ref 135–145)

## 2020-12-04 LAB — CBC
HCT: 33.3 % — ABNORMAL LOW (ref 36.0–46.0)
Hemoglobin: 10.6 g/dL — ABNORMAL LOW (ref 12.0–15.0)
MCH: 28 pg (ref 26.0–34.0)
MCHC: 31.8 g/dL (ref 30.0–36.0)
MCV: 87.9 fL (ref 80.0–100.0)
Platelets: 156 10*3/uL (ref 150–400)
RBC: 3.79 MIL/uL — ABNORMAL LOW (ref 3.87–5.11)
RDW: 16.1 % — ABNORMAL HIGH (ref 11.5–15.5)
WBC: 8.9 10*3/uL (ref 4.0–10.5)
nRBC: 0 % (ref 0.0–0.2)

## 2020-12-04 LAB — GLUCOSE, CAPILLARY
Glucose-Capillary: 121 mg/dL — ABNORMAL HIGH (ref 70–99)
Glucose-Capillary: 81 mg/dL (ref 70–99)
Glucose-Capillary: 85 mg/dL (ref 70–99)

## 2020-12-04 LAB — PROTIME-INR
INR: 1.6 — ABNORMAL HIGH (ref 0.8–1.2)
Prothrombin Time: 19.4 seconds — ABNORMAL HIGH (ref 11.4–15.2)

## 2020-12-04 LAB — PROTEIN / CREATININE RATIO, URINE
Creatinine, Urine: 94.4 mg/dL
Protein Creatinine Ratio: 1.9 mg/mg{Cre} — ABNORMAL HIGH (ref 0.00–0.15)
Total Protein, Urine: 179 mg/dL

## 2020-12-04 LAB — URINE CULTURE: Culture: NO GROWTH

## 2020-12-04 LAB — TRIGLYCERIDES: Triglycerides: 147 mg/dL (ref <150)

## 2020-12-04 LAB — SODIUM, URINE, RANDOM: Sodium, Ur: 64 mmol/L

## 2020-12-04 LAB — PHOSPHORUS: Phosphorus: 3.9 mg/dL (ref 2.5–4.6)

## 2020-12-04 LAB — MAGNESIUM: Magnesium: 2 mg/dL (ref 1.7–2.4)

## 2020-12-04 MED ORDER — LORAZEPAM 1 MG PO TABS: 1.0000 mg | ORAL_TABLET | Freq: Once | ORAL | Status: DC | PRN

## 2020-12-04 MED ORDER — SODIUM CHLORIDE 0.9 % IV SOLN
3.0000 g | Freq: Four times a day (QID) | INTRAVENOUS | Status: AC
  Administered 2020-12-04 – 2020-12-07 (×14): 3 g via INTRAVENOUS
  Filled 2020-12-04 (×10): qty 8
  Filled 2020-12-04: qty 3
  Filled 2020-12-04 (×4): qty 8

## 2020-12-04 NOTE — Progress Notes (Deleted)
NAME:  Angela Ramirez, MRN:  [id number], DOB:  12-08-1945, LOS: 1 ADMISSION DATE:  12/02/2020, CONSULTATION DATE:  12/03/20 REFERRING MD:  Denton Lank, CHIEF COMPLAINT:  Sepsis  History of Present Illness:  This is a 75 yo female with history of HTN and hepatitis C who presents with several days of nausea and vomiting.  Home COVID test negative.  Patient had been taking Macrobid recently for UTI.  Brought to the ICU for dyspnea, tachypnea.  Found to have sepsis with fevers of 104.6, AKI, elevated lactic acid.    Per son patient not a drug user or etoh user.   Pertinent  Medical History  HTN Depression Hepatitis C IBS UTI Cataracts Arthritis  Significant Hospital Events: Including procedures, antibiotic start and stop dates in addition to other pertinent events   6/11 Patient intubated and admitted to the ICU 6/12 CT chest abdomen - atelectatic changes in the lungs, colonic diverticulosis inflammation.   Interim History / Subjective:   Remains on the ventilator.  Has been hemodynamically stable.   Overnight, Patient febrile with tmax 102.4; reducible with Tyklenol anmd cooling blanket. Does not require pressors. Patient tolerating SBT; No increased work of breathing. Patient will be extubated today.    Objective   Blood pressure (!) 171/79, pulse (!) 101, temperature (!) 101.6 F (38.7 C), temperature source Bladder, resp. rate (!) 34, height 5\' 7"  (1.702 m), weight 89.2 kg, SpO2 100 %.    Vent Mode: PSV;CPAP FiO2 (%):  [30 %] 30 % Set Rate:  [18 bmp] 18 bmp Vt Set:  [490 mL] 490 mL PEEP:  [5 cmH20] 5 cmH20 Pressure Support:  [5 cmH20] 5 cmH20 Plateau Pressure:  [16 cmH20-17 cmH20] 17 cmH20   Intake/Output Summary (Last 24 hours) at 12/04/2020 1017 Last data filed at 12/04/2020 0936 Gross per 24 hour  Intake 4223.33 ml  Output 573 ml  Net 3650.33 ml   Filed Weights   12/02/20 2201 12/04/20 0530  Weight: 85 kg 89.2 kg    Examination: Gen:      Patient lying in bed, no  acute distress HEENT:  EOMI, sclera anicteric Neck:     No masses; no thyromegaly, ET tube Lungs:    Clear to auscultation bilaterally; normal respiratory effort CV:         Regular rate and rhythm; no murmurs Abd:      + bowel sounds; soft, non-tender; no palpable masses, no distension Ext:    No edema; adequate peripheral perfusion Skin:      Warm and dry; no rash Neuro: Awake and alert on the ventilator.  Patient able to follow verbal commands.  Appropriate eye accommodation.   Labs/imaging that I havepersonally reviewed  (right click and "Reselect all SmartList Selections" daily)   CT chest abdomen pelvis 6/12-atelectatic changes in the lungs, colonic diverticulosis with possible early diverticulitis, cirrhosis with possible early developing diverticulitis, air trapping, hepatic cirrhosis.  Sodium 133, potassium 4.2, creatinine 1.27,  WBC 8.9, hemoglobin 10.6, platelets 156  AST 205, ALT 101   Resolved Hospital Problem list   Not applicable  Assessment & Plan:  This is a 21 with history of HTN who presents for chief complaint of rapid breathing found to be in spesis  Severe sepsis present on admission 6/12 CT -distal colonic diverticulosis with a subtle focus of peridiverticular inflammation in the proximal sigmoid.  Patient's sepsis is likely due to diverticulitis. WBC 8.9; increased from 5.1 yesterday D/c'ed Zosyn and Started Unasyn   Follow cultures  Elevated LFTs Likely secondary to sepsis in the setting of baseline cirrhosis.  Continue monitoring CT abdomen does not show any gallbladder pathology.  Continue monitoring labs  AKI 6/13 Cr 1.27; increased from 1.11 yesterday Avoid nephrotoxic agents Minimal UOP:  603. 6/12 CT showed no signs hydronephrosis, less concern for urinary retention. Protein/Cr ratio, Urine FENa pending  Follow urine output and creatinine  Impending respiratory failure-patient tachypneic and and is obviously fatigued -Patient presentation  also improved. She is currently on minimal vent settings.  Patient also tolerated SBT. -Extubation today   Hypertension Hold home blood pressure medication  Best practice (right click and "Reselect all SmartList Selections" daily)  Diet:  NPO Pain/Anxiety/Delirium protocol (if indicated): Yes (RASS goal -2) VAP protocol (if indicated): Yes DVT prophylaxis: LMWH GI prophylaxis: H2B Glucose control:  SSI Yes Central venous access:  N/A Arterial line:  N/A Foley:  Yes, and it is still needed Mobility:  bed rest  PT consulted: N/A Last date of multidisciplinary goals of care discussion [12/03/20 with son Bufford Lope who can be reached at the following number-[phone]] Code Status:  full code Disposition: ICU  Dennie Bible MS4

## 2020-12-04 NOTE — Progress Notes (Signed)
Patient reported unable to see from her left eye at 1240. A complete NIH was completed with a score of 1. Pt is alert and oriented x4, following commands, moving all extremities. No complaints of pain or discomfort when asked.Will continue to monitor pt as order by PA. Rahul. DO. Gaynell Face was also notify.

## 2020-12-04 NOTE — Progress Notes (Addendum)
Patient tachypneic with increased drowsiness and slurred speech, but easily arousable to voice. Patient previously A&O x4, now oriented x2 to person, and time, disoriented to place and situation. Checked blood sugar, CBG 85. O2 saturations in the upper 90's. Patient still unable to see out of left eye. Neuro exam with no changes in side-to-side extremity comparison.   Notified E-Link, will continue to monitor patient closely

## 2020-12-04 NOTE — Progress Notes (Addendum)
eLink Physician-Brief Progress Note Patient Name: Angela Ramirez DOB: 12/10/1945 MRN: [id number]   Date of Service  12/04/2020  HPI/Events of Note  RN requests pre-medication for MRI Brain as patient is very anxious.   eICU Interventions  Ordered Ativan 1mg  PO x1 dose prior to MRI.   ADDENDUM 12/04/20 9:55 PM  - Called because of fluctuating mental status. While very anxious earlier, she's now more confused and sleepy (although she readily wakes to voice she quickly falls back asleep). She did NOT receive Ativan pre-medication for MRI earlier as her MRI has been delayed. - On my exam, she is comfortable in appearance (sleeping except when spoken to) and saturating in mid to high 90s on room air. Slightly tachypneic. - Plan: D/c Ativan pre-medication. Keep off supplemental O2 as it is not needed at this time. OK to proceed with MRI in current state.  Intervention Category Minor Interventions: Agitation / anxiety - evaluation and management  12/06/20 12/04/2020, 7:43 PM

## 2020-12-04 NOTE — Progress Notes (Signed)
Pharmacy Antibiotic Note  Angela Ramirez is a 75 y.o. female admitted on 12/02/2020 with  intra-abdominal infection  concerning for diverticulosis. Pharmacy consulted to transition Zosyn to Unasyn for coverage.   Plan: - D/c Zosyn - Start Unasyn 3g IV every 6 hours - Will continue to follow renal function, culture results, LOT, and antibiotic de-escalation plans   Height: 5\' 7"  (170.2 cm) Weight: 89.2 kg (196 lb 10.4 oz) IBW/kg (Calculated) : 61.6  Temp (24hrs), Avg:100.6 F (38.1 C), Min:98.7 F (37.1 C), Max:102.4 F (39.1 C)  Recent Labs  Lab 12/02/20 2054 12/02/20 2225 12/03/20 0126 12/03/20 0515 12/04/20 0508  WBC 7.1  --   --  5.1 8.9  CREATININE 1.11*  --   --  1.11* 1.27*  LATICACIDVEN  --  1.7 2.2* 1.4  --      Estimated Creatinine Clearance: 45.9 mL/min (A) (by C-G formula based on SCr of 1.27 mg/dL (H)).    Allergies  Allergen Reactions   Crab (Diagnostic) Hives   Morphine And Related Nausea And Vomiting   Sulfa Antibiotics Nausea And Vomiting    Cefepime 6/11 x 1 Vancomycin 6/11 x 1  Zosyn 6/12 >>   6/11 Fluvid >> neg 6/11 BCx >> ngx2d 6/12 MRSA PCR >> neg 6/12 RCx >> 6/12 UCx >> NGf  Thank you for allowing pharmacy to be a part of this patient's care.  8/12, PharmD, BCPS Clinical Pharmacist Clinical phone for 12/04/2020: [phone] 12/04/2020 10:00 AM   **Pharmacist phone directory can now be found on [email or web address] ([email or web address]).  Listed under [facility].

## 2020-12-04 NOTE — Progress Notes (Signed)
NAME:  Angela Ramirez, MRN:  [id number], DOB:  03/02/1946, LOS: 1 ADMISSION DATE:  12/02/2020, CONSULTATION DATE:  12/03/20 REFERRING MD:  Denton Lank, CHIEF COMPLAINT:  Sepsis  History of Present Illness:  This is a 75 yo female with history of HTN and hepatitis C who presents with several days of nausea and vomiting.  Home COVID test negative.  Patient had been taking Macrobid recently for UTI.  Brought to the ICU for dyspnea, tachypnea.  Found to have sepsis with fevers of 104.6, AKI, elevated lactic acid.    Per son patient not a drug user or etoh user.   Pertinent  Medical History  HTN Depression Hepatitis C IBS UTI Cataracts Arthritis  Significant Hospital Events: Including procedures, antibiotic start and stop dates in addition to other pertinent events   6/11 Patient intubated and admitted to the ICU  Interim History / Subjective:  6/13: pt on vent (minimal settings), not on pressors. Tmax 102.6 iover past 24 hours. Uop marginal at 656ml/24h. On cpap and hopeful for extubation in next 24 hours.  6/12:Remains on the ventilator.  Has been hemodynamically stable.  Does not require pressors   Objective   Blood pressure 136/64, pulse 100, temperature 99.5 F (37.5 C), temperature source Bladder, resp. rate (!) 32, height 5\' 7"  (1.702 m), weight 89.2 kg, SpO2 100 %.    Vent Mode: PSV;CPAP FiO2 (%):  [30 %] 30 % Set Rate:  [18 bmp] 18 bmp Vt Set:  [490 mL] 490 mL PEEP:  [5 cmH20] 5 cmH20 Pressure Support:  [5 cmH20] 5 cmH20 Plateau Pressure:  [16 cmH20-17 cmH20] 17 cmH20   Intake/Output Summary (Last 24 hours) at 12/04/2020 0759 Last data filed at 12/04/2020 0700 Gross per 24 hour  Intake 4793.2 ml  Output 603 ml  Net 4190.2 ml   Filed Weights   12/02/20 2201 12/04/20 0530  Weight: 85 kg 89.2 kg    Examination: Gen:      No acute distress, ett in bed, awake  HEENT:  EOMI, sclera anicteric, mmmp Neck:     No masses; no thyromegaly, ET tube Lungs:    Clear to  auscultation bilaterally; normal respiratory effort on vent CV:         Regular rate and rhythm; no murmurs Abd:      + bowel sounds; soft, non-tender; no palpable masses, no distension Ext:    No edema; adequate peripheral perfusion Skin:      Warm and dry; no rash Neuro: Awake, communicative and alert on the ventilator  Labs/imaging that I havepersonally reviewed  (right click and "Reselect all SmartList Selections" daily)   CT chest abdomen pelvis 6/12-atelectatic changes in the lungs, colonic diverticulosis with possible early diverticulitis, cirrhosis with possible early developing diverticulitis, air trapping, hepatic cirrhosis.  Sodium 130->133, potassium 2.9->4.2, creatinine 1.11->1.27   Resolved Hospital Problem list   Not applicable  Assessment & Plan:  This is a 74 with history of HTN who presents for chief complaint of rapid breathing found to have sepsis 2/2 intra-abdominal process  Severe sepsis present on admission Etiology is UTI versus early developing diverticulitis Follow cultures, change abx to unasyn from zosyn IV fluids  Elevated LFTs Likely secondary to sepsis in the setting of baseline cirrhosis.  Continue monitoring CT abdomen does not show any gallbladder pathology.  Continue monitoring labs  AKI Follow urine output and creatinine -marginal uop over past 24 hours -relatively stable to slighly worsened indices -check urine studies -no hydro on ct  Acute  hypoxic resp failure:  -minimal vent settings at this time.  -intubated 2/2 resp fatigue with acidosis and resulting tachypnea -on cpap.   Hypertension Holding home blood pressure medication  Best practice (right click and "Reselect all SmartList Selections" daily)  Diet:  NPO Pain/Anxiety/Delirium protocol (if indicated): Yes (RASS goal -2) VAP protocol (if indicated): Yes DVT prophylaxis: LMWH GI prophylaxis: H2B Glucose control:  SSI Yes Central venous access:  N/A Arterial line:   N/A Foley:  Yes, and it is still needed Mobility:  bed rest  PT consulted: N/A Last date of multidisciplinary goals of care discussion [12/03/20 with son Bufford Lope who can be reached at the following number-[phone]] Code Status:  full code Disposition: ICU  Critical care time: The patient is critically ill with multiple organ systems failure and requires high complexity decision making for assessment and support, frequent evaluation and titration of therapies, application of advanced monitoring technologies and extensive interpretation of multiple databases.  Critical care time 35 mins. This represents my time independent of the NPs time taking care of the pt. This is excluding procedures.    Briant Sites DO [facility] 12/04/2020, 8:00 AM See Amion for pager If no response to pager, please call [phone] until 1900 After 1900 please call [facility] [phone]

## 2020-12-04 NOTE — Procedures (Signed)
Extubation Procedure Note  Patient Details:   Name: Angela Ramirez DOB: 1946-03-28 MRN: [id number]   Airway Documentation:    Vent end date: 12/04/20 Vent end time: 1002   Evaluation  O2 sats: stable throughout Complications: No apparent complications Patient did tolerate procedure well. Bilateral Breath Sounds: Clear, Diminished   Yes  Cuff leak heard prior to extubation. Pt tolerating well on 3L [place], should be able to wean off O2 via RN.   Dewain Penning T 12/04/2020, 10:11 AM

## 2020-12-05 ENCOUNTER — Ambulatory Visit (HOSPITAL_COMMUNITY): Payer: PPO

## 2020-12-05 ENCOUNTER — Ambulatory Visit: Payer: PPO

## 2020-12-05 DIAGNOSIS — R509 Fever, unspecified: Secondary | ICD-10-CM

## 2020-12-05 LAB — MAGNESIUM: Magnesium: 2.1 mg/dL (ref 1.7–2.4)

## 2020-12-05 LAB — COMPREHENSIVE METABOLIC PANEL
ALT: 109 U/L — ABNORMAL HIGH (ref 0–44)
AST: 207 U/L — ABNORMAL HIGH (ref 15–41)
Albumin: 2.1 g/dL — ABNORMAL LOW (ref 3.5–5.0)
Alkaline Phosphatase: 187 U/L — ABNORMAL HIGH (ref 38–126)
Anion gap: 10 (ref 5–15)
BUN: 18 mg/dL (ref 8–23)
CO2: 21 mmol/L — ABNORMAL LOW (ref 22–32)
Calcium: 8.3 mg/dL — ABNORMAL LOW (ref 8.9–10.3)
Chloride: 103 mmol/L (ref 98–111)
Creatinine, Ser: 0.89 mg/dL (ref 0.44–1.00)
GFR, Estimated: >60 mL/min (ref >60)
Glucose, Bld: 87 mg/dL (ref 70–99)
Potassium: 3.3 mmol/L — ABNORMAL LOW (ref 3.5–5.1)
Sodium: 134 mmol/L — ABNORMAL LOW (ref 135–145)
Total Bilirubin: 1.7 mg/dL — ABNORMAL HIGH (ref 0.3–1.2)
Total Protein: 5.7 g/dL — ABNORMAL LOW (ref 6.5–8.1)

## 2020-12-05 LAB — CBC
HCT: 28.8 % — ABNORMAL LOW (ref 36.0–46.0)
Hemoglobin: 9.6 g/dL — ABNORMAL LOW (ref 12.0–15.0)
MCH: 28 pg (ref 26.0–34.0)
MCHC: 33.3 g/dL (ref 30.0–36.0)
MCV: 84 fL (ref 80.0–100.0)
Platelets: 157 10*3/uL (ref 150–400)
RBC: 3.43 MIL/uL — ABNORMAL LOW (ref 3.87–5.11)
RDW: 16.1 % — ABNORMAL HIGH (ref 11.5–15.5)
WBC: 6.6 10*3/uL (ref 4.0–10.5)
nRBC: 0 % (ref 0.0–0.2)

## 2020-12-05 LAB — PHOSPHORUS: Phosphorus: 3.1 mg/dL (ref 2.5–4.6)

## 2020-12-05 MED ORDER — POTASSIUM CHLORIDE 10 MEQ/100ML IV SOLN
10.0000 meq | INTRAVENOUS | Status: AC
  Administered 2020-12-05 (×4): 10 meq via INTRAVENOUS
  Filled 2020-12-05 (×4): qty 100

## 2020-12-05 MED ORDER — BETHANECHOL CHLORIDE 10 MG PO TABS
10.0000 mg | ORAL_TABLET | Freq: Three times a day (TID) | ORAL | Status: AC
  Administered 2020-12-05 – 2020-12-08 (×9): 10 mg via ORAL
  Filled 2020-12-05 (×9): qty 1

## 2020-12-05 MED ORDER — ALPRAZOLAM 0.25 MG PO TABS
0.2500 mg | ORAL_TABLET | Freq: Two times a day (BID) | ORAL | Status: AC | PRN
  Administered 2020-12-06 (×2): 0.25 mg via ORAL
  Filled 2020-12-05 (×2): qty 1

## 2020-12-05 MED ORDER — POTASSIUM CHLORIDE CRYS ER 20 MEQ PO TBCR
20.0000 meq | EXTENDED_RELEASE_TABLET | ORAL | Status: AC
  Administered 2020-12-05 (×2): 20 meq via ORAL
  Filled 2020-12-05 (×2): qty 1

## 2020-12-05 MED ORDER — ORAL CARE MOUTH RINSE
15.0000 mL | Freq: Two times a day (BID) | OROMUCOSAL | Status: DC
  Administered 2020-12-05 – 2020-12-11 (×12): 15 mL via OROMUCOSAL

## 2020-12-05 NOTE — Progress Notes (Signed)
NAME:  Angela Ramirez, MRN:  [id number], DOB:  [birth year], LOS: 2 ADMISSION DATE:  12/02/2020, CONSULTATION DATE:  12/03/20 REFERRING MD:  Denton Lank, CHIEF COMPLAINT:  Sepsis  History of Present Illness:  This is a 75 yo female with history of HTN and hepatitis C who presents with several days of nausea and vomiting.  Home COVID test negative.  Patient had been taking Macrobid recently for UTI.  Brought to the ICU for dyspnea, tachypnea.  Found to have sepsis with fevers of 104.6, AKI, elevated lactic acid.    Per son patient not a drug user or etoh user.   Pertinent  Medical History  HTN Depression Hepatitis C IBS UTI Cataracts Arthritis  Significant Hospital Events: Including procedures, antibiotic start and stop dates in addition to other pertinent events   6/11 Patient intubated and admitted to the ICU 6/13 Extubated. Change abx to unasyn from zosyn.   Interim History / Subjective:  Per notes, overnight patient had fluctuating mental status.  She was anxious and confused, due to this she was not given premedication for MRI.  MRI was delayed.  Patient was febrile overnight with a T-max of 102.  Patient continues to endorse transient loss of vision in her left eye.  She also states that her vision in her left is blurry and she is seeing black specks floating in her vision.  She denies any eye pain or headaches.   Objective   Blood pressure 128/65, pulse 79, temperature 98.5 F (36.9 C), temperature source Oral, resp. rate (!) 28, height 5\' 7"  (1.702 m), weight 89.2 kg, SpO2 94 %.        Intake/Output Summary (Last 24 hours) at 12/05/2020 1000 Last data filed at 12/05/2020 0700 Gross per 24 hour  Intake 1316.05 ml  Output 1175 ml  Net 141.05 ml   Filed Weights   12/02/20 2201 12/04/20 0530  Weight: 85 kg 89.2 kg    Examination: Gen:      No acute distress, laying in bed HEENT:  EOMI, sclera anicteric Neck:     No masses; no thyromegaly Lungs:   Tachypneic; clear to  auscultation bilaterally; no wheezing or rhonchi CV:         Regular rate and rhythm; no murmurs Abd:      + bowel sounds; soft, non-tender; no palpable masses, no distension Ext:    No edema; adequate peripheral perfusion Skin:      Warm and dry; no rash Neuro: Awake, communicative and alert.   Labs/imaging that I havepersonally reviewed  (right click and "Reselect all SmartList Selections" daily)   CT chest abdomen pelvis 6/12-atelectatic changes in the lungs, colonic diverticulosis with possible early diverticulitis, cirrhosis with possible early developing diverticulitis, air trapping, hepatic cirrhosis.  CT Heat 6/13 - Mild periventricular small vessel disease. 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 Subtle decreased attenuation in portions of the head of the caudate nucleus on the right and anterior limb right internal capsule, not convincingly seen 1 day prior.    Sodium 133 -> 134, potassium 4.2->3.3, creatinine 1.27-> 0.89   Resolved Hospital Problem list   Not applicable  Assessment & Plan:  This is a 89 with history of HTN who presents for chief complaint of rapid breathing found to have sepsis 2/2 intra-abdominal process  Severe sepsis present on admission Etiology is UTI versus early developing diverticulitis Follow cultures, change abx to unasyn from zosyn IV fluids  Elevated LFTs Likely secondary to sepsis in the setting of  baseline cirrhosis.  Continue monitoring CT abdomen does not show any gallbladder pathology.  Continue monitoring labs  AKI, Improving 6/14 Cr 0.89 Follow urine output and creatinine -check urine studies -no hydro on ct -UOP of 1325; output improved  Acute hypoxic resp failure:  -extubation on 6/13  Hypertension Holding home blood pressure medication  Transient visual loss of Left eye - Etiology of visual loss currently unknown. - MRI pending - Curbside with patient's home ophthalmologist.   Best practice (right click  and "Reselect all SmartList Selections" daily)  Diet:  NPO Pain/Anxiety/Delirium protocol (if indicated): Yes (RASS goal -2) VAP protocol (if indicated): Yes DVT prophylaxis: LMWH GI prophylaxis: H2B Glucose control:  SSI Yes Central venous access:  N/A Arterial line:  N/A Foley:  Yes, and it is still needed Mobility:  bed rest  PT consulted: N/A Last date of multidisciplinary goals of care discussion [12/03/20 with son Bufford Lope who can be reached at the following number-[phone]] Code Status:  full code Disposition: ICU  Critical care time: The patient is critically ill with multiple organ systems failure and requires high complexity decision making for assessment and support, frequent evaluation and titration of therapies, application of advanced monitoring technologies and extensive interpretation of multiple databases.  Critical care time 35 mins. This represents my time independent of the NPs time taking care of the pt. This is excluding procedures.    Dennie Bible MS4

## 2020-12-05 NOTE — Progress Notes (Signed)
NAME:  Angela Ramirez, MRN:  [id number], DOB:  01/19/1946, LOS: 2 ADMISSION DATE:  12/02/2020, CONSULTATION DATE:  12/03/20 REFERRING MD:  Denton Lank, CHIEF COMPLAINT:  Sepsis  History of Present Illness:  This is a 75 yo female with history of HTN and hepatitis C who presents with several days of nausea and vomiting.  Home COVID test negative.  Patient had been taking Macrobid recently for UTI.  Brought to the ICU for dyspnea, tachypnea.  Found to have sepsis with fevers of 104.6, AKI, elevated lactic acid.    Per son patient not a drug user or etoh user.   Pertinent  Medical History  HTN Depression Hepatitis C IBS UTI Cataracts Arthritis  Significant Hospital Events: Including procedures, antibiotic start and stop dates in addition to other pertinent events   6/11 Patient intubated and admitted to the ICU  Interim History / Subjective:  6/14: tmax 102. Uop improved, renal indices improved. Did have change in vision in L eye. Spoke with eye surgeon yesterday evening, he states that pt did not have much left and small event could have presented as she is reporting, mri still pending. 6/13: pt on vent (minimal settings), not on pressors. Tmax 102.6 iover past 24 hours. Uop marginal at 625ml/24h. On cpap and hopeful for extubation in next 24 hours.  6/12:Remains on the ventilator.  Has been hemodynamically stable.  Does not require pressors   Objective   Blood pressure 128/65, pulse 79, temperature 98.5 F (36.9 C), temperature source Oral, resp. rate (!) 28, height 5\' 7"  (1.702 m), weight 89.2 kg, SpO2 94 %.        Intake/Output Summary (Last 24 hours) at 12/05/2020 0801 Last data filed at 12/05/2020 0700 Gross per 24 hour  Intake 1662.64 ml  Output 1325 ml  Net 337.64 ml   Filed Weights   12/02/20 2201 12/04/20 0530  Weight: 85 kg 89.2 kg    Examination: Gen:      No acute distress, awake sitting up in bed, speech more articulate but still not normal  HEENT:  NCAT, EOMI,  sclera anicteric, mmmp Neck:     No masses; no thyromegaly Lungs:    Clear to auscultation bilaterally CV:         Regular rate and rhythm; no murmurs Abd:      + bowel sounds; soft, non-tender; no palpable masses, no distension Ext:    No edema; adequate peripheral perfusion Skin:      Warm and dry; no rash Neuro: Awake, communicative, still with disturbed L eye vision and abnormal arm function (self reported) not focal strength deficit.   Labs/imaging that I havepersonally reviewed  (right click and "Reselect all SmartList Selections" daily)   CT chest abdomen pelvis 6/12-atelectatic changes in the lungs, colonic diverticulosis with possible early diverticulitis, cirrhosis with possible early developing diverticulitis, air trapping, hepatic cirrhosis.  Sodium 134, potassium 3.3, creatinine 1.11->1.27->0.89   Resolved Hospital Problem list   Not applicable  Assessment & Plan:  This is a 89 with history of HTN who presents for chief complaint of rapid breathing found to have sepsis 2/2 intra-abdominal process  Severe sepsis present on admission Etiology is UTI versus early developing diverticulitis Follow cultures, cont unasyn IV fluids  Elevated LFTs Likely secondary to sepsis in the setting of baseline cirrhosis.  Continue monitoring CT abdomen does not show any gallbladder pathology.  Continue monitoring labs  AKI Follow urine output and creatinine -uop impropved to >1L over past 24h -improved indices -no  hydro on ct  Acute hypoxic resp failure:  -minimal vent settings at this time.  -intubated 2/2 resp fatigue with acidosis and resulting tachypnea -on cpap.   Hypertension Holding home blood pressure medication  Abnormal vision:  -mri pending -cth ? Small cva -notified optho   Best practice (right click and "Reselect all SmartList Selections" daily)  Diet:  Oral Pain/Anxiety/Delirium protocol (if indicated): No VAP protocol (if indicated): Not indicated DVT  prophylaxis: LMWH GI prophylaxis: H2B Glucose control:  SSI Yes Central venous access:  N/A Arterial line:  N/A Foley:  N/A Mobility:  bed rest  PT consulted: N/A Last date of multidisciplinary goals of care discussion [12/03/20 with son Bufford Lope who can be reached at the following number-[phone]] Code Status:  full code Disposition: stable for transfer from ICU at this time. I will ask [facility] to assume care and ccm will sign off at that time.   Care time: care time 35 mins. This represents my time independent of the NPs time taking care of the pt. This is excluding procedures.    Briant Sites DO [facility] 12/05/2020, 8:01 AM See Amion for pager If no response to pager, please call [phone] until 1900 After 1900 please call [facility] [phone]

## 2020-12-05 NOTE — Progress Notes (Signed)
K+3.3 ?Replaced per protocol  ?

## 2020-12-05 NOTE — Progress Notes (Signed)
No bipap needed at this time pt is slightly tachypneic but resting comfortably. VS are stable.

## 2020-12-05 NOTE — Progress Notes (Signed)
Speech Pathology:  Pt passed [facility] Screen after brief intubation.  Started on a diet and doing well per nursing. No SLP swallow eval warranted - our service will sign off.  Anjel Perfetti L. Samson Frederic, MA CCC/SLP Acute Rehabilitation Services Office number [phone] Pager [phone]

## 2020-12-06 ENCOUNTER — Encounter (HOSPITAL_COMMUNITY): Payer: Self-pay | Admitting: Pulmonary Disease

## 2020-12-06 ENCOUNTER — Inpatient Hospital Stay (HOSPITAL_COMMUNITY): Payer: PPO

## 2020-12-06 DIAGNOSIS — K5732 Diverticulitis of large intestine without perforation or abscess without bleeding: Secondary | ICD-10-CM | POA: Diagnosis present

## 2020-12-06 DIAGNOSIS — R6520 Severe sepsis without septic shock: Secondary | ICD-10-CM

## 2020-12-06 LAB — COMPREHENSIVE METABOLIC PANEL
ALT: 104 U/L — ABNORMAL HIGH (ref 0–44)
AST: 206 U/L — ABNORMAL HIGH (ref 15–41)
Albumin: 2 g/dL — ABNORMAL LOW (ref 3.5–5.0)
Alkaline Phosphatase: 267 U/L — ABNORMAL HIGH (ref 38–126)
Anion gap: 8 (ref 5–15)
BUN: 14 mg/dL (ref 8–23)
CO2: 23 mmol/L (ref 22–32)
Calcium: 8.4 mg/dL — ABNORMAL LOW (ref 8.9–10.3)
Chloride: 105 mmol/L (ref 98–111)
Creatinine, Ser: 0.75 mg/dL (ref 0.44–1.00)
GFR, Estimated: >60 mL/min (ref >60)
Glucose, Bld: 90 mg/dL (ref 70–99)
Potassium: 3.8 mmol/L (ref 3.5–5.1)
Sodium: 136 mmol/L (ref 135–145)
Total Bilirubin: 1.5 mg/dL — ABNORMAL HIGH (ref 0.3–1.2)
Total Protein: 5.6 g/dL — ABNORMAL LOW (ref 6.5–8.1)

## 2020-12-06 LAB — CBC
HCT: 28 % — ABNORMAL LOW (ref 36.0–46.0)
Hemoglobin: 9.4 g/dL — ABNORMAL LOW (ref 12.0–15.0)
MCH: 28.5 pg (ref 26.0–34.0)
MCHC: 33.6 g/dL (ref 30.0–36.0)
MCV: 84.8 fL (ref 80.0–100.0)
Platelets: 186 10*3/uL (ref 150–400)
RBC: 3.3 MIL/uL — ABNORMAL LOW (ref 3.87–5.11)
RDW: 16.5 % — ABNORMAL HIGH (ref 11.5–15.5)
WBC: 6.8 10*3/uL (ref 4.0–10.5)
nRBC: 0 % (ref 0.0–0.2)

## 2020-12-06 LAB — PHOSPHORUS: Phosphorus: 3.6 mg/dL (ref 2.5–4.6)

## 2020-12-06 LAB — MAGNESIUM: Magnesium: 1.9 mg/dL (ref 1.7–2.4)

## 2020-12-06 MED ORDER — AMLODIPINE BESYLATE 10 MG PO TABS
10.0000 mg | ORAL_TABLET | Freq: Every day | ORAL | Status: DC
  Administered 2020-12-06 – 2020-12-11 (×6): 10 mg via ORAL
  Filled 2020-12-06 (×6): qty 1

## 2020-12-06 MED ORDER — LINACLOTIDE 145 MCG PO CAPS
145.0000 ug | ORAL_CAPSULE | Freq: Every day | ORAL | Status: DC
  Administered 2020-12-07 – 2020-12-09 (×3): 145 ug via ORAL
  Filled 2020-12-06 (×5): qty 1

## 2020-12-06 MED ORDER — POLYETHYLENE GLYCOL 3350 17 G PO PACK
17.0000 g | PACK | Freq: Every day | ORAL | Status: DC
  Filled 2020-12-06 (×2): qty 1

## 2020-12-06 NOTE — Progress Notes (Addendum)
Progress Note    Angela Ramirez  WJX:[id number] DOB: 01/11/1946  DOA: 12/02/2020 PCP: Myrlene Broker, MD      Brief Narrative:    Medical records reviewed and are as summarized below:  Angela Ramirez is a 75 y.o. female with medical history significant for hypertension, hepatitis C, liver cirrhosis, depression, anxiety, diverticulosis, IBS, Arnold-Chiari malformation, recent left eye surgery, recent treatment of UTI with Macrobid, who presented to the hospital with nausea and vomiting.  She was febrile, tachycardic, tachypneic and hypotensive in the emergency room.  She was admitted to the hospital for severe sepsis secondary to acute diverticulitis complicated by acute hypoxic respiratory failure, acute metabolic encephalopathy and acute kidney injury.  She was admitted to the ICU for management.  She was intubated and placed on mechanical ventilation for acute respiratory failure.  She was treated with empiric IV antibiotics and IV fluids.  Her condition improved and she was transferred to [facility] on 12/06/2020.    Assessment/Plan:   Principal Problem:   Severe sepsis (HCC) Active Problems:   Diverticulitis large intestine   Body mass index is 30.8 kg/m.  (Obesity)  Severe sepsis secondary to acute diverticulitis: Continue empiric IV antibiotics.  Acute hypoxic respiratory failure: S/p intubated on 12/04/2019 extubated on 12/04/2020.  She is tolerating room air.    AKI: Improving  Hypokalemia and hyponatremia: Resolved  History of cataract, Abormal left eye vision: MRI brain did not show any acute stroke.  She has abnormal left eye vision at baseline but vision is worse.  Case was discussed with ophthalmologist, Dr. [name].  He said evaluation of the left eye will be limited because he does not have all the requested equipment to do a thorough assessment in the hospital.  He recommended outpatient follow-up.  He requested her son's contact  information so that he could discuss this with the patient's son.  Elevated liver enzymes, hepatitis C, liver cirrhosis: Compensated  Generalized weakness: PT and OT evaluation  Constipation: Ordered laxatives  Hypertension: Resume home amlodipine.  Hold HCTZ.   Diet Order             DIET SOFT Room service appropriate? Yes; Fluid consistency: Thin  Diet effective now                      Consultants: Intensivist  Procedures: Intubation and mechanical ventilation    Medications:    amLODipine  10 mg Oral Daily   bethanechol  10 mg Oral TID   Chlorhexidine Gluconate Cloth  6 each Topical Daily   enoxaparin (LOVENOX) injection  40 mg Subcutaneous QHS   linaclotide  145 mcg Oral QAC breakfast   mouth rinse  15 mL Mouth Rinse BID   polyethylene glycol  17 g Oral Daily   Continuous Infusions:  sodium chloride Stopped (12/04/20 1413)   ampicillin-sulbactam (UNASYN) IV Stopped (12/06/20 1510)   famotidine (PEPCID) IV 20 mg (12/06/20 0539)     Anti-infectives (From admission, onward)    Start     Dose/Rate Route Frequency Ordered Stop   12/04/20 1400  Ampicillin-Sulbactam (UNASYN) 3 g in sodium chloride 0.9 % 100 mL IVPB        3 g 200 mL/hr over 30 Minutes Intravenous Every 6 hours 12/04/20 0959 12/07/20 2359   12/03/20 2200  vancomycin (VANCOREADY) IVPB 1000 mg/200 mL  Status:  Discontinued        1,000 mg 200 mL/hr over 60 Minutes Intravenous Every 24  hours 12/02/20 2228 12/03/20 1009   12/03/20 1000  ceFEPIme (MAXIPIME) 2 g in sodium chloride 0.9 % 100 mL IVPB  Status:  Discontinued        2 g 200 mL/hr over 30 Minutes Intravenous Every 12 hours 12/02/20 2228 12/03/20 0828   12/03/20 0800  metroNIDAZOLE (FLAGYL) IVPB 500 mg  Status:  Discontinued        500 mg 100 mL/hr over 60 Minutes Intravenous Every 8 hours 12/03/20 0029 12/03/20 1009   12/03/20 0600  piperacillin-tazobactam (ZOSYN) IVPB 3.375 g  Status:  Discontinued        3.375 g 12.5 mL/hr over  240 Minutes Intravenous Every 8 hours 12/03/20 0159 12/04/20 0959   12/03/20 0030  metroNIDAZOLE (FLAGYL) IVPB 500 mg        500 mg 100 mL/hr over 60 Minutes Intravenous  Once 12/03/20 0016 12/03/20 0210   12/02/20 2130  vancomycin (VANCOREADY) IVPB 1000 mg/200 mL  Status:  Discontinued        1,000 mg 200 mL/hr over 60 Minutes Intravenous  Once 12/02/20 2054 12/02/20 2057   12/02/20 2130  vancomycin (VANCOREADY) IVPB 1750 mg/350 mL        1,750 mg 175 mL/hr over 120 Minutes Intravenous  Once 12/02/20 2057 12/03/20 0001   12/02/20 2100  ceFEPIme (MAXIPIME) 2 g in sodium chloride 0.9 % 100 mL IVPB        2 g 200 mL/hr over 30 Minutes Intravenous  Once 12/02/20 2054 12/02/20 2206              Family Communication/Anticipated D/C date and plan/Code Status   DVT prophylaxis: enoxaparin (LOVENOX) injection 40 mg Start: 12/03/20 0045     Code Status: Full Code  Family Communication: Brynda Greathouseandall, son, at beside Disposition Plan:    Status is: Inpatient  Remains inpatient appropriate because:IV treatments appropriate due to intensity of illness or inability to take PO  Dispo: The patient is from: Home              Anticipated d/c is to: Home              Patient currently is not medically stable to d/c.   Difficult to place patient No           Subjective:   C/o abdominal pain, constipation, dysuria, difficulty passing urine, cough and generalized weakness..  She still cannot see out of the left eye.  Her son, Brynda GreathouseRandall, was at the bedside  Objective:    Vitals:   12/05/20 1937 12/05/20 2105 12/06/20 0531 12/06/20 1319  BP:  (!) 164/77 106/89 (!) 158/83  Pulse:  84 74 81  Resp:  20 (!) 22 (!) 24  Temp: 97.7 F (36.5 C) 97.9 F (36.6 C) 98.2 F (36.8 C) 98.2 F (36.8 C)  TempSrc: Oral Oral Oral Oral  SpO2:  95% 93% 97%  Weight:      Height:       No data found.   Intake/Output Summary (Last 24 hours) at 12/06/2020 1549 Last data filed at 12/06/2020  1510 Gross per 24 hour  Intake 550 ml  Output 1650 ml  Net -1100 ml   Filed Weights   12/02/20 2201 12/04/20 0530  Weight: 85 kg 89.2 kg    Exam:  GEN: NAD SKIN: Warm and dry EYES: EOMI ENT: MMM CV: RRR PULM: CTA B ABD: soft, ND, LLQ tenderness, +BS CNS: AAO x 3, non focal EXT: No edema or tenderness  Data Reviewed:   I have personally reviewed following labs and imaging studies:  Labs: Labs show the following:   Basic Metabolic Panel: Recent Labs  Lab 12/02/20 2054 12/03/20 0055 12/03/20 0123 12/03/20 0230 12/03/20 0515 12/04/20 0508 12/05/20 0518 12/06/20 0212  NA 131*   < >  --  133* 130* 133* 134* 136  K 3.2*   < >  --  2.8* 2.9* 4.2 3.3* 3.8  CL 99  --   --   --  100 102 103 105  CO2 19*  --   --   --  19* 20* 21* 23  GLUCOSE 127*  --   --   --  129* 79 87 90  BUN 23  --   --   --  21 26* 18 14  CREATININE 1.11*  --   --   --  1.11* 1.27* 0.89 0.75  CALCIUM 8.3*  --   --   --  8.0* 8.3* 8.3* 8.4*  MG  --   --  2.0  --   --  2.0 2.1 1.9  PHOS  --   --   --   --   --  3.9 3.1 3.6   < > = values in this interval not displayed.   GFR Estimated Creatinine Clearance: 72.9 mL/min (by C-G formula based on SCr of 0.75 mg/dL). Liver Function Tests: Recent Labs  Lab 12/02/20 2054 12/03/20 0515 12/05/20 0518 12/06/20 0212  AST 201* 205* 207* 206*  ALT 112* 101* 109* 104*  ALKPHOS 102 92 187* 267*  BILITOT 1.2 1.5* 1.7* 1.5*  PROT 7.4 6.3* 5.7* 5.6*  ALBUMIN 3.0* 2.5* 2.1* 2.0*   No results for input(s): LIPASE, AMYLASE in the last 168 hours. Recent Labs  Lab 12/03/20 0123 12/03/20 0515  AMMONIA 15 27   Coagulation profile Recent Labs  Lab 12/03/20 0515 12/04/20 0508  INR 1.5* 1.6*    CBC: Recent Labs  Lab 12/02/20 2054 12/03/20 0055 12/03/20 0230 12/03/20 0515 12/04/20 0508 12/05/20 0518 12/06/20 0212  WBC 7.1  --   --  5.1 8.9 6.6 6.8  NEUTROABS 6.2  --   --   --   --   --   --   HGB 12.0   < > 11.6* 10.2* 10.6* 9.6*  9.4*  HCT 36.3   < > 34.0* 30.5* 33.3* 28.8* 28.0*  MCV 84.6  --   --  84.0 87.9 84.0 84.8  PLT 181  --   --  144* 156 157 186   < > = values in this interval not displayed.   Cardiac Enzymes: Recent Labs  Lab 12/03/20 0123  CKTOTAL 1,075*   BNP (last 3 results) No results for input(s): PROBNP in the last 8760 hours. CBG: Recent Labs  Lab 12/03/20 1515 12/03/20 1957 12/03/20 2322 12/04/20 0313 12/04/20 2139  GLUCAP 101* 100* 77 81 85   D-Dimer: No results for input(s): DDIMER in the last 72 hours. Hgb A1c: No results for input(s): HGBA1C in the last 72 hours. Lipid Profile: Recent Labs    12/04/20 0508  TRIG 147   Thyroid function studies: No results for input(s): TSH, T4TOTAL, T3FREE, THYROIDAB in the last 72 hours.  Invalid input(s): FREET3 Anemia work up: No results for input(s): VITAMINB12, FOLATE, FERRITIN, TIBC, IRON, RETICCTPCT in the last 72 hours. Sepsis Labs: Recent Labs  Lab 12/02/20 2225 12/03/20 0126 12/03/20 0515 12/04/20 0508 12/05/20 0518 12/06/20 3220  PROCALCITON  --   --  9.18  --   --   --   WBC  --   --  5.1 8.9 6.6 6.8  LATICACIDVEN 1.7 2.2* 1.4  --   --   --     Microbiology Recent Results (from the past 240 hour(s))  Blood Culture (routine x 2)     Status: None (Preliminary result)   Collection Time: 12/02/20  8:50 PM   Specimen: BLOOD LEFT HAND  Result Value Ref Range Status   Specimen Description BLOOD LEFT HAND  Final   Special Requests   Final    BOTTLES DRAWN AEROBIC AND ANAEROBIC Blood Culture adequate volume   Culture   Final    NO GROWTH 4 DAYS Performed at [facility], [address], [place], [place] [address]    Report Status PENDING  Incomplete  Blood Culture (routine x 2)     Status: None (Preliminary result)   Collection Time: 12/02/20  9:15 PM   Specimen: BLOOD RIGHT WRIST  Result Value Ref Range Status   Specimen Description BLOOD RIGHT WRIST  Final   Special Requests   Final    BOTTLES DRAWN  AEROBIC AND ANAEROBIC Blood Culture adequate volume   Culture   Final    NO GROWTH 4 DAYS Performed at [facility], [address] [place], [place] [address]    Report Status PENDING  Incomplete  Resp Panel by RT-PCR (Flu A&B, Covid) Nasopharyngeal Swab     Status: None   Collection Time: 12/02/20 11:09 PM   Specimen: Nasopharyngeal Swab; Nasopharyngeal(NP) swabs in vial transport medium  Result Value Ref Range Status   SARS Coronavirus 2 by RT PCR NEGATIVE NEGATIVE Final    Comment: (NOTE) SARS-CoV-2 target nucleic acids are NOT DETECTED.  The SARS-CoV-2 RNA is generally detectable in upper respiratory specimens during the acute phase of infection. The lowest concentration of SARS-CoV-2 viral copies this assay can detect is 138 copies/mL. A negative result does not preclude SARS-Cov-2 infection and should not be used as the sole basis for treatment or other patient management decisions. A negative result may occur with  improper specimen collection/handling, submission of specimen other than nasopharyngeal swab, presence of viral mutation(s) within the areas targeted by this assay, and inadequate number of viral copies(<138 copies/mL). A negative result must be combined with clinical observations, patient history, and epidemiological information. The expected result is Negative.  Fact Sheet for Patients:  BloggerCourse.com  Fact Sheet for Healthcare Providers:  SeriousBroker.it  This test is no t yet approved or cleared by the [place] FDA and  has been authorized for detection and/or diagnosis of SARS-CoV-2 by FDA under an Emergency Use Authorization (EUA). This EUA will remain  in effect (meaning this test can be used) for the duration of the COVID-19 declaration under Section 564(b)(1) of the Act, 21 U.S.C.section 360bbb-3(b)(1), unless the authorization is terminated  or revoked sooner.       Influenza A by  PCR NEGATIVE NEGATIVE Final   Influenza B by PCR NEGATIVE NEGATIVE Final    Comment: (NOTE) The Xpert Xpress SARS-CoV-2/FLU/RSV plus assay is intended as an aid in the diagnosis of influenza from Nasopharyngeal swab specimens and should not be used as a sole basis for treatment. Nasal washings and aspirates are unacceptable for Xpert Xpress SARS-CoV-2/FLU/RSV testing.  Fact Sheet for Patients: BloggerCourse.com  Fact Sheet for Healthcare Providers: SeriousBroker.it  This test is not yet approved or cleared by the [place] and has been authorized  for detection and/or diagnosis of SARS-CoV-2 by FDA under an Emergency Use Authorization (EUA). This EUA will remain in effect (meaning this test can be used) for the duration of the COVID-19 declaration under Section 564(b)(1) of the Act, 21 U.S.C. section 360bbb-3(b)(1), unless the authorization is terminated or revoked.  Performed at [facility], [address], [place], [place] [address]   Urine culture     Status: None   Collection Time: 12/03/20 12:17 AM   Specimen: In/Out Cath Urine  Result Value Ref Range Status   Specimen Description IN/OUT CATH URINE  Final   Special Requests NONE  Final   Culture   Final    NO GROWTH Performed at [facility], [address], [place], [place] [address]    Report Status 12/04/2020 FINAL  Final  MRSA Next Gen by PCR, Nasal     Status: None   Collection Time: 12/03/20  2:18 AM  Result Value Ref Range Status   MRSA by PCR Next Gen NOT DETECTED NOT DETECTED Final    Comment: (NOTE) The GeneXpert MRSA Assay (FDA approved for NASAL specimens only), is one component of a comprehensive MRSA colonization surveillance program. It is not intended to diagnose MRSA infection nor to guide or monitor treatment for MRSA infections. Test performance is not FDA approved in patients less than 64 years old. Performed at [facility], [address], [place], [place] [address]     Procedures and diagnostic studies:  MR BRAIN WO CONTRAST  Result Date: 12/06/2020 CLINICAL DATA:  History of nausea and vomiting. Altered mental status. Left eye vision disturbance. EXAM: MRI HEAD WITHOUT CONTRAST TECHNIQUE: Multiplanar, multiecho pulse sequences of the brain and surrounding structures were obtained without intravenous contrast. COMPARISON:  Head CT 2 days ago. FINDINGS: The study suffers from moderate motion degradation. Brain: Diffusion imaging does not show any acute or subacute infarction. The brainstem and cerebellum are normal. Cerebral hemispheres are normal for age. No sign of small vessel change. No cortical or large vessel territory infarction. Mass lesion, hemorrhage, hydrocephalus or extra-axial collection. Vascular: Major vessels at the base of the brain show flow. Skull and upper cervical spine: Negative Sinuses/Orbits: Clear/normal Other: None IMPRESSION: Motion degraded study. No acute abnormality suspected. No abnormality seen to explain the presenting symptoms. Brain appears normal for age. Electronically Signed   By: Paulina Fusi M.D.   On: 12/06/2020 12:49               LOS: 3 days   Candace Ramus  [facility]   Pager on [email or web address]. If 7PM-7AM, please contact night-coverage at [email or web address]     12/06/2020, 3:49 PM

## 2020-12-06 NOTE — Plan of Care (Signed)
  Problem: Education: Goal: Knowledge of General Education information will improve Description: Including pain rating scale, medication(s)/side effects and non-pharmacologic comfort measures Outcome: Progressing   Problem: Health Behavior/Discharge Planning: Goal: Ability to manage health-related needs will improve Outcome: Progressing   Problem: Clinical Measurements: Goal: Ability to maintain clinical measurements within normal limits will improve Outcome: Progressing Goal: Will remain free from infection Outcome: Progressing Goal: Diagnostic test results will improve Outcome: Progressing Goal: Respiratory complications will improve Outcome: Progressing Goal: Cardiovascular complication will be avoided Outcome: Progressing   Problem: Activity: Goal: Risk for activity intolerance will decrease Outcome: Progressing   Problem: Nutrition: Goal: Adequate nutrition will be maintained Outcome: Progressing   Problem: Elimination: Goal: Will not experience complications related to bowel motility Outcome: Progressing Goal: Will not experience complications related to urinary retention Outcome: Progressing   Problem: Coping: Goal: Level of anxiety will decrease Outcome: Progressing   Problem: Pain Managment: Goal: General experience of comfort will improve Outcome: Progressing   Problem: Safety: Goal: Ability to remain free from injury will improve Outcome: Progressing   

## 2020-12-06 NOTE — Progress Notes (Signed)
No bipap at this time

## 2020-12-06 NOTE — Progress Notes (Signed)
Patient voided 2x this shift. Bladder scans post void insignificant.

## 2020-12-07 LAB — CBC
HCT: 28.6 % — ABNORMAL LOW (ref 36.0–46.0)
Hemoglobin: 9.6 g/dL — ABNORMAL LOW (ref 12.0–15.0)
MCH: 28.2 pg (ref 26.0–34.0)
MCHC: 33.6 g/dL (ref 30.0–36.0)
MCV: 84.1 fL (ref 80.0–100.0)
Platelets: 224 10*3/uL (ref 150–400)
RBC: 3.4 MIL/uL — ABNORMAL LOW (ref 3.87–5.11)
RDW: 16.4 % — ABNORMAL HIGH (ref 11.5–15.5)
WBC: 6.4 10*3/uL (ref 4.0–10.5)
nRBC: 0 % (ref 0.0–0.2)

## 2020-12-07 LAB — COMPREHENSIVE METABOLIC PANEL
ALT: 98 U/L — ABNORMAL HIGH (ref 0–44)
AST: 162 U/L — ABNORMAL HIGH (ref 15–41)
Albumin: 2.2 g/dL — ABNORMAL LOW (ref 3.5–5.0)
Alkaline Phosphatase: 379 U/L — ABNORMAL HIGH (ref 38–126)
Anion gap: 7 (ref 5–15)
BUN: 7 mg/dL — ABNORMAL LOW (ref 8–23)
CO2: 28 mmol/L (ref 22–32)
Calcium: 8.2 mg/dL — ABNORMAL LOW (ref 8.9–10.3)
Chloride: 104 mmol/L (ref 98–111)
Creatinine, Ser: 0.64 mg/dL (ref 0.44–1.00)
GFR, Estimated: >60 mL/min (ref >60)
Glucose, Bld: 111 mg/dL — ABNORMAL HIGH (ref 70–99)
Potassium: 3.1 mmol/L — ABNORMAL LOW (ref 3.5–5.1)
Sodium: 139 mmol/L (ref 135–145)
Total Bilirubin: 1 mg/dL (ref 0.3–1.2)
Total Protein: 5.9 g/dL — ABNORMAL LOW (ref 6.5–8.1)

## 2020-12-07 LAB — PHOSPHORUS: Phosphorus: 4.5 mg/dL (ref 2.5–4.6)

## 2020-12-07 LAB — RAPID URINE DRUG SCREEN, HOSP PERFORMED
Amphetamines: NOT DETECTED
Barbiturates: NOT DETECTED
Benzodiazepines: NOT DETECTED
Cocaine: NOT DETECTED
Opiates: NOT DETECTED
Tetrahydrocannabinol: NOT DETECTED

## 2020-12-07 LAB — CULTURE, BLOOD (ROUTINE X 2)
Culture: NO GROWTH
Culture: NO GROWTH
Special Requests: ADEQUATE
Special Requests: ADEQUATE

## 2020-12-07 LAB — MAGNESIUM: Magnesium: 1.7 mg/dL (ref 1.7–2.4)

## 2020-12-07 MED ORDER — PHENAZOPYRIDINE HCL 200 MG PO TABS
200.0000 mg | ORAL_TABLET | Freq: Three times a day (TID) | ORAL | Status: DC
  Administered 2020-12-07 – 2020-12-11 (×11): 200 mg via ORAL
  Filled 2020-12-07 (×13): qty 1

## 2020-12-07 MED ORDER — MAGNESIUM OXIDE -MG SUPPLEMENT 400 (240 MG) MG PO TABS
400.0000 mg | ORAL_TABLET | Freq: Once | ORAL | Status: AC
  Administered 2020-12-07: 400 mg via ORAL
  Filled 2020-12-07: qty 1

## 2020-12-07 MED ORDER — POTASSIUM CHLORIDE CRYS ER 20 MEQ PO TBCR
40.0000 meq | EXTENDED_RELEASE_TABLET | Freq: Once | ORAL | Status: AC
  Administered 2020-12-07: 40 meq via ORAL
  Filled 2020-12-07: qty 2

## 2020-12-07 MED ORDER — RISAQUAD PO CAPS
1.0000 | ORAL_CAPSULE | Freq: Three times a day (TID) | ORAL | Status: DC
  Administered 2020-12-07 – 2020-12-11 (×12): 1 via ORAL
  Filled 2020-12-07 (×11): qty 1

## 2020-12-07 MED ORDER — FLUCONAZOLE 100 MG PO TABS
200.0000 mg | ORAL_TABLET | Freq: Every day | ORAL | Status: DC
  Administered 2020-12-07: 200 mg via ORAL
  Filled 2020-12-07: qty 2

## 2020-12-07 NOTE — Evaluation (Signed)
Physical Therapy Evaluation Patient Details Name: Angela Ramirez MRN: [id number] DOB: 1945-10-11 Today's Date: 12/07/2020   History of Present Illness  Pt adm 6/11 with nausea and vomiting. Pt foune to have severe sepsis due to acute diverticulitis. Pt intubated 6/11 and extubated 6/13.  PMH HTN, depression, hept C UTI, arthritis  Clinical Impression  Pt admitted with above diagnosis and presents to PT with functional limitations due to deficits listed below (See PT problem list). Pt needs skilled PT to maximize independence and safety to allow discharge to ST-SNF prior to return home. Pt lives with elderly sister. Expect pt will make good progress with mobility toward eventual return to independent living.      Follow Up Recommendations SNF    Equipment Recommendations  Rolling walker with 5" wheels;Other (comment) (vs rollator)    Recommendations for Other Services       Precautions / Restrictions Precautions Precautions: Fall      Mobility  Bed Mobility Overal bed mobility: Needs Assistance Bed Mobility: Supine to Sit;Sit to Supine     Supine to sit: Min guard;HOB elevated     General bed mobility comments: Incr time and effort and use of rail    Transfers Overall transfer level: Needs assistance Equipment used: Rolling walker (2 wheeled) Transfers: Sit to/from [employer] Sit to Stand: Mod assist Stand pivot transfers: Mod assist       General transfer comment: Assist to elevate hips and for balance. Verbal cues for hand placment. Bed to chair with walker with tiny shuffling, pivotal movements.  Ambulation/Gait                Stairs            Wheelchair Mobility    Modified Rankin (Stroke Patients Only)       Balance Overall balance assessment: Needs assistance Sitting-balance support: Feet supported;Bilateral upper extremity supported Sitting balance-Leahy Scale: Poor Sitting balance - Comments: UE support   Standing  balance support: Bilateral upper extremity supported Standing balance-Leahy Scale: Poor Standing balance comment: walker and min assist for static standing                             Pertinent Vitals/Pain Pain Assessment: No/denies pain    Home Living Family/patient expects to be discharged to:: Private residence Living Arrangements: Other relatives Available Help at Discharge: Family Type of Home: House Home Access: Level entry     Home Layout: One level Home Equipment: None Additional Comments: Pt reports she normally takes care of the home and lives with sister who she assist with "cognitivly"    Prior Function Level of Independence: Independent (takes care of the home)         Comments: driving and shopping     Hand Dominance   Dominant Hand: Right    Extremity/Trunk Assessment   Upper Extremity Assessment Upper Extremity Assessment: Defer to OT evaluation    Lower Extremity Assessment Lower Extremity Assessment: Generalized weakness    Cervical / Trunk Assessment Cervical / Trunk Assessment: Kyphotic  Communication      Cognition Arousal/Alertness: Awake/alert Behavior During Therapy: WFL for tasks assessed/performed Overall Cognitive Status: Within Functional Limits for tasks assessed                                 General Comments: Pt takes increase time to process  General Comments      Exercises     Assessment/Plan    PT Assessment Patient needs continued PT services  PT Problem List Decreased strength;Decreased activity tolerance;Decreased balance;Decreased mobility;Decreased knowledge of use of DME       PT Treatment Interventions DME instruction;Gait training;Functional mobility training;Therapeutic exercise;Therapeutic activities;Balance training;Patient/family education    PT Goals (Current goals can be found in the Care Plan section)  Acute Rehab PT Goals Patient Stated Goal: To feel stronger PT  Goal Formulation: With patient Time For Goal Achievement: 12/21/20 Potential to Achieve Goals: Good    Frequency Min 3X/week   Barriers to discharge Decreased caregiver support Lives with elderly sister    Co-evaluation               AM-PAC PT "6 Clicks" Mobility  Outcome Measure Help needed turning from your back to your side while in a flat bed without using bedrails?: A Little Help needed moving from lying on your back to sitting on the side of a flat bed without using bedrails?: A Little Help needed moving to and from a bed to a chair (including a wheelchair)?: A Lot Help needed standing up from a chair using your arms (e.g., wheelchair or bedside chair)?: A Lot Help needed to walk in hospital room?: Total Help needed climbing 3-5 steps with a railing? : Total 6 Click Score: 12    End of Session Equipment Utilized During Treatment: Gait belt Activity Tolerance: Patient tolerated treatment well Patient left: in chair;with call bell/phone within reach;with chair alarm set Nurse Communication: Mobility status;Need for lift equipment PT Visit Diagnosis: Unsteadiness on feet (R26.81);Other abnormalities of gait and mobility (R26.89);Muscle weakness (generalized) (M62.81)    Time: 8938-1017 PT Time Calculation (min) (ACUTE ONLY): 27 min   Charges:   PT Evaluation $PT Eval Moderate Complexity: 1 Mod PT Treatments $Therapeutic Activity: 8-22 mins        [facility] PT Acute Rehabilitation Services Pager [phone] Office [phone]   Angelina Ok [facility] 12/07/2020, 5:30 PM

## 2020-12-07 NOTE — Evaluation (Signed)
Occupational Therapy Evaluation Patient Details Name: Angela Ramirez MRN: [id number] DOB: [birth year] Today's Date: 12/07/2020    History of Present Illness Pt is a 75 yr old female who came in due to presents with several days of nausea and vomiting. Pt was admitted due to severe sepsis. PMH HTN, depression, hept C UTI, arthritis   Clinical Impression   Pt admitted with above. Pt lives with their sister and was independent at [facility]. Pt reports decrease vision in L side but is "getting better". Pt also noted to have decrease in FM coordination on LUE and self reports BLE "feel like jello". Pt was only able to complete bed mobility with min assist and then with increase time of sitting at EOB started to lean to the L side and requested to lay back down.  Pt's nurse updated about status at this time. Pt currently with functional limitations due to the deficits listed below (see OT Problem List).  Pt will benefit from skilled OT to increase their safety and independence with ADL and functional mobility for ADL to facilitate discharge to venue listed below.        Follow Up Recommendations  SNF;Supervision/Assistance - 24 hour    Equipment Recommendations       Recommendations for Other Services       Precautions / Restrictions Precautions Precautions: Fall Precaution Comments: vitals Restrictions Weight Bearing Restrictions: No      Mobility Bed Mobility Overal bed mobility: Needs Assistance Bed Mobility: Rolling;Supine to Sit;Sit to Supine Rolling: Min guard   Supine to sit: Min guard;Min assist Sit to supine: Min assist   General bed mobility comments: Pt requires increase time and fatgues quickly    Transfers Overall transfer level: Needs assistance               General transfer comment: Pt reported they can not attempt to stand as due to fatigue at this time    Balance Overall balance assessment: Needs assistance Sitting-balance support: Feet  supported;Bilateral upper extremity supported Sitting balance-Leahy Scale: Fair Sitting balance - Comments: Pt within increase time started to lean to L side Postural control: Left lateral lean                                 ADL either performed or assessed with clinical judgement   ADL Overall ADL's : Needs assistance/impaired Eating/Feeding: Set up;Sitting   Grooming: Wash/dry hands;Set up;Sitting;Bed level   Upper Body Bathing: Moderate assistance;Cueing for safety;Cueing for sequencing;Sitting;Bed level   Lower Body Bathing: Maximal assistance;Bed level   Upper Body Dressing : Moderate assistance;Sitting;Bed level   Lower Body Dressing: Maximal assistance;Bed level                 General ADL Comments: Pt fatigues with sitting EOB     Vision Patient Visual Report: Blurring of vision;Other (comment) (Pt reported her L eye is getting better but only can see bright to dark)       Perception     Praxis      Pertinent Vitals/Pain Pain Assessment: No/denies pain     Hand Dominance Right   Extremity/Trunk Assessment Upper Extremity Assessment Upper Extremity Assessment: LUE deficits/detail LUE Deficits / Details: decrease in contol LUE Coordination: decreased fine motor   Lower Extremity Assessment Lower Extremity Assessment: Defer to PT evaluation   Cervical / Trunk Assessment Cervical / Trunk Assessment: Kyphotic   Communication Communication Communication: Other (comment) (Pt  reports this not my normal voice)   Cognition Arousal/Alertness: Awake/alert Behavior During Therapy: WFL for tasks assessed/performed Overall Cognitive Status: Within Functional Limits for tasks assessed                                 General Comments: Pt takes increase time to process   General Comments       Exercises     Shoulder Instructions      Home Living Family/patient expects to be discharged to:: Private residence Living  Arrangements: Other relatives Available Help at Discharge: Family Type of Home: House Home Access: Level entry     Home Layout: One level     Bathroom Shower/Tub: IT trainer: Standard Bathroom Accessibility: Yes How Accessible: Accessible via walker Home Equipment: None   Additional Comments: Pt reports she normally takes care of the home and lives with sister who she assist with "cognitivly"      Prior Functioning/Environment Level of Independence: Independent (takes care of the home)        Comments: driving and shopping        OT Problem List: Decreased strength;Decreased range of motion;Decreased activity tolerance;Impaired balance (sitting and/or standing);Decreased coordination;Decreased safety awareness;Decreased knowledge of use of DME or AE;Cardiopulmonary status limiting activity      OT Treatment/Interventions: Self-care/ADL training;Therapeutic exercise;Neuromuscular education;Therapeutic activities;Cognitive remediation/compensation;Visual/perceptual remediation/compensation;Patient/family education;Balance training    OT Goals(Current goals can be found in the care plan section) Acute Rehab OT Goals Patient Stated Goal: To feel stronger OT Goal Formulation: With patient Time For Goal Achievement: 12/16/20 Potential to Achieve Goals: Good ADL Goals Pt Will Perform Grooming: with set-up;sitting Pt Will Perform Upper Body Bathing: sitting;with modified independence Pt Will Perform Lower Body Bathing: with modified independence;sit to/from stand Pt Will Transfer to Toilet: with modified independence;ambulating;regular height toilet;grab bars  OT Frequency: Min 2X/week   Barriers to D/C:            Co-evaluation              AM-PAC OT "6 Clicks" Daily Activity     Outcome Measure Help from another person eating meals?: A Little Help from another person taking care of personal grooming?: A Little Help from another  person toileting, which includes using toliet, bedpan, or urinal?: A Lot Help from another person bathing (including washing, rinsing, drying)?: A Lot Help from another person to put on and taking off regular upper body clothing?: A Lot Help from another person to put on and taking off regular lower body clothing?: A Lot 6 Click Score: 14   End of Session Equipment Utilized During Treatment: Gait belt Nurse Communication: Mobility status;Other (comment) (L side weakness/coordination)  Activity Tolerance: Patient limited by fatigue Patient left: in bed;with call bell/phone within reach;with bed alarm set  OT Visit Diagnosis: Unsteadiness on feet (R26.81);Other abnormalities of gait and mobility (R26.89);Muscle weakness (generalized) (M62.81);Low vision, both eyes (H54.2);Dizziness and giddiness (R42)                Time: 1660-6004 OT Time Calculation (min): 36 min Charges:  OT General Charges $OT Visit: 1 Visit OT Evaluation $OT Eval Low Complexity: 1 Low OT Treatments $Self Care/Home Management : 8-22 mins  Alphia Moh OTR/L  Acute Rehab Services  [phone] office number [phone] pager number   Alphia Moh 12/07/2020, 9:29 AM

## 2020-12-07 NOTE — Progress Notes (Signed)
Progress Note    Angela Ramirez  OZD:[id number] DOB: 18-Apr-1946  DOA: 12/02/2020 PCP: Myrlene Broker, MD      Brief Narrative:    Medical records reviewed and are as summarized below:  Angela Ramirez is a 75 y.o. female with medical history significant for hypertension, hepatitis C, liver cirrhosis, depression, anxiety, diverticulosis, IBS, Arnold-Chiari malformation, recent left eye surgery, recent treatment of UTI with Macrobid, who presented to the hospital with nausea and vomiting.  She was febrile, tachycardic, tachypneic and hypotensive in the emergency room.  She was admitted to the hospital for severe sepsis secondary to acute diverticulitis complicated by acute hypoxic respiratory failure, acute metabolic encephalopathy and acute kidney injury.  She was admitted to the ICU for management.  She was intubated and placed on mechanical ventilation for acute respiratory failure.  She was treated with empiric IV antibiotics and IV fluids.  Her condition improved and she was transferred to [facility] hospitalist service on 12/06/2020.     Subjective:   The patient was seen and examined this morning, stable awake alert communicating, per patient tolerating p.o.  Patient's son present at bedside stating she was totally functional and independent prior to this admission Now suffering from significant generalized weakness.... He is expressing concern!   Assessment/Plan:   Principal Problem:   Severe sepsis (HCC) Active Problems:   Diverticulitis large intestine  Severe sepsis secondary to acute diverticulitis:  Much improved sepsis pathophysiology, hemodynamically stable now, Continue empiric IV antibiotics.  Acute hypoxic respiratory failure:  Resolved S/p intubated on 12/04/2019 extubated on 12/04/2020.   On room air satting greater 90%  AKI: Resolved  Hypokalemia and hyponatremia: Resolved  History of cataract, Abormal left eye vision:  MRI brain did not show any  acute stroke.  She has abnormal left eye vision at baseline but vision is worsen on this admission.   Case was discussed with ophthalmologist, Dr. [name].  He said evaluation of the left eye will be limited because he does not have all the requested equipment to do a thorough assessment in the hospital.  He recommended outpatient follow-up.  He requested her son's contact information so that he could discuss this with the patient's son. No further changes in visual acuity  Elevated liver enzymes, hepatitis C, liver cirrhosis: Compensated  Generalized weakness: PT and OT evaluation  Constipation: Ordered laxatives  Hypertension: Resume home amlodipine.  Hold HCTZ.  Body mass index is 30.8 kg/m.  (Obesity)  Diet Order             DIET SOFT Room service appropriate? Yes; Fluid consistency: Thin  Diet effective now                  Consultants: Intensivist  Procedures: Intubation and mechanical ventilation    Medications:    amLODipine  10 mg Oral Daily   bethanechol  10 mg Oral TID   Chlorhexidine Gluconate Cloth  6 each Topical Daily   enoxaparin (LOVENOX) injection  40 mg Subcutaneous QHS   linaclotide  145 mcg Oral QAC breakfast   mouth rinse  15 mL Mouth Rinse BID   polyethylene glycol  17 g Oral Daily   Continuous Infusions:  sodium chloride Stopped (12/04/20 1413)   ampicillin-sulbactam (UNASYN) IV 3 g (12/07/20 1307)   famotidine (PEPCID) IV 20 mg (12/07/20 0456)     Anti-infectives (From admission, onward)    Start     Dose/Rate Route Frequency Ordered Stop   12/04/20 1400  Ampicillin-Sulbactam (  UNASYN) 3 g in sodium chloride 0.9 % 100 mL IVPB        3 g 200 mL/hr over 30 Minutes Intravenous Every 6 hours 12/04/20 0959 12/07/20 2359   12/03/20 2200  vancomycin (VANCOREADY) IVPB 1000 mg/200 mL  Status:  Discontinued        1,000 mg 200 mL/hr over 60 Minutes Intravenous Every 24 hours 12/02/20 2228 12/03/20 1009   12/03/20 1000  ceFEPIme (MAXIPIME)  2 g in sodium chloride 0.9 % 100 mL IVPB  Status:  Discontinued        2 g 200 mL/hr over 30 Minutes Intravenous Every 12 hours 12/02/20 2228 12/03/20 0828   12/03/20 0800  metroNIDAZOLE (FLAGYL) IVPB 500 mg  Status:  Discontinued        500 mg 100 mL/hr over 60 Minutes Intravenous Every 8 hours 12/03/20 0029 12/03/20 1009   12/03/20 0600  piperacillin-tazobactam (ZOSYN) IVPB 3.375 g  Status:  Discontinued        3.375 g 12.5 mL/hr over 240 Minutes Intravenous Every 8 hours 12/03/20 0159 12/04/20 0959   12/03/20 0030  metroNIDAZOLE (FLAGYL) IVPB 500 mg        500 mg 100 mL/hr over 60 Minutes Intravenous  Once 12/03/20 0016 12/03/20 0210   12/02/20 2130  vancomycin (VANCOREADY) IVPB 1000 mg/200 mL  Status:  Discontinued        1,000 mg 200 mL/hr over 60 Minutes Intravenous  Once 12/02/20 2054 12/02/20 2057   12/02/20 2130  vancomycin (VANCOREADY) IVPB 1750 mg/350 mL        1,750 mg 175 mL/hr over 120 Minutes Intravenous  Once 12/02/20 2057 12/03/20 0001   12/02/20 2100  ceFEPIme (MAXIPIME) 2 g in sodium chloride 0.9 % 100 mL IVPB        2 g 200 mL/hr over 30 Minutes Intravenous  Once 12/02/20 2054 12/02/20 2206        Family Communication/Anticipated D/C date and plan/Code Status   DVT prophylaxis: enoxaparin (LOVENOX) injection 40 mg Start: 12/03/20 0045     Code Status: Full Code  Family Communication: Son present at bedside Disposition Plan:    Status is: Inpatient  Remains inpatient appropriate because:IV treatments appropriate due to intensity of illness or inability to take PO  Dispo: The patient is from: Home              Anticipated d/c is to: Home              Patient currently is not medically stable to d/c.   Difficult to place patient No         Objective:    Vitals:   12/06/20 1319 12/06/20 2035 12/07/20 0450 12/07/20 1325  BP: (!) 158/83 (!) 141/60 (!) 162/81 (!) 160/72  Pulse: 81 78 74 76  Resp: (!) Temp: 98.2 F (36.8 C) 98.2  F (36.8 C) 97.8 F (36.6 C) 98 F (36.7 C)  TempSrc: Oral Oral Oral Oral  SpO2: 97% 95% 95% 97%  Weight:      Height:       No data found.   Intake/Output Summary (Last 24 hours) at 12/07/2020 1339 Last data filed at 12/07/2020 1300 Gross per 24 hour  Intake 398 ml  Output 5375 ml  Net -4977 ml   Filed Weights   12/02/20 2201 12/04/20 0530  Weight: 85 kg 89.2 kg      Physical Exam:   General:  Alert, oriented, cooperative, no distress;  HEENT:  Normocephalic, PERRL, otherwise with in Normal limits   Neuro:  CNII-XII intact. , normal motor and sensation, reflexes intact   Lungs:   Clear to auscultation BL, Respirations unlabored, no wheezes / crackles  Cardio:    S1/S2, RRR, No murmure, No Rubs or Gallops   Abdomen:   Soft, non-tender, bowel sounds active all four quadrants,  no guarding or peritoneal signs.  Muscular skeletal:  Limited exam - in bed, able to move all 4 extremities, Normal strength,  2+ pulses,  symmetric, No pitting edema  Skin:  Dry, warm to touch, negative for any Rashes,  Wounds: Please see nursing documentation        Data Reviewed:   I have personally reviewed following labs and imaging studies:  Labs: Labs show the following:   Basic Metabolic Panel: Recent Labs  Lab 12/03/20 0123 12/03/20 0230 12/03/20 0515 12/04/20 0508 12/05/20 0518 12/06/20 0212 12/07/20 0018  NA  --    < > 130* 133* 134* 136 139  K  --    < > 2.9* 4.2 3.3* 3.8 3.1*  CL  --   --  100 102 103 105 104  CO2  --   --  19* 20* 21* 23 28  GLUCOSE  --   --  129* 79 87 90 111*  BUN  --   --  21 26* 18 14 7*  CREATININE  --   --  1.11* 1.27* 0.89 0.75 0.64  CALCIUM  --   --  8.0* 8.3* 8.3* 8.4* 8.2*  MG 2.0  --   --  2.0 2.1 1.9 1.7  PHOS  --   --   --  3.9 3.1 3.6 4.5   < > = values in this interval not displayed.   GFR Estimated Creatinine Clearance: 72.9 mL/min (by C-G formula based on SCr of 0.64 mg/dL). Liver Function Tests: Recent Labs  Lab  12/02/20 2054 12/03/20 0515 12/05/20 0518 12/06/20 0212 12/07/20 0018  AST 201* 205* 207* 206* 162*  ALT 112* 101* 109* 104* 98*  ALKPHOS 102 92 187* 267* 379*  BILITOT 1.2 1.5* 1.7* 1.5* 1.0  PROT 7.4 6.3* 5.7* 5.6* 5.9*  ALBUMIN 3.0* 2.5* 2.1* 2.0* 2.2*   No results for input(s): LIPASE, AMYLASE in the last 168 hours. Recent Labs  Lab 12/03/20 0123 12/03/20 0515  AMMONIA 15 27   Coagulation profile Recent Labs  Lab 12/03/20 0515 12/04/20 0508  INR 1.5* 1.6*    CBC: Recent Labs  Lab 12/02/20 2054 12/03/20 0055 12/03/20 0515 12/04/20 0508 12/05/20 0518 12/06/20 0212 12/07/20 0018  WBC 7.1  --  5.1 8.9 6.6 6.8 6.4  NEUTROABS 6.2  --   --   --   --   --   --   HGB 12.0   < > 10.2* 10.6* 9.6* 9.4* 9.6*  HCT 36.3   < > 30.5* 33.3* 28.8* 28.0* 28.6*  MCV 84.6  --  84.0 87.9 84.0 84.8 84.1  PLT 181  --  144* 156 157 186 224   < > = values in this interval not displayed.   Cardiac Enzymes: Recent Labs  Lab 12/03/20 0123  CKTOTAL 1,075*   BNP (last 3 results) No results for input(s): PROBNP in the last 8760 hours. CBG: Recent Labs  Lab 12/03/20 1515 12/03/20 1957 12/03/20 2322 12/04/20 0313 12/04/20 2139  GLUCAP 101* 100* 77 81 85   D-Dimer: No results for input(s): DDIMER in the last 72 hours. Hgb A1c:  No results for input(s): HGBA1C in the last 72 hours. Lipid Profile: No results for input(s): CHOL, HDL, LDLCALC, TRIG, CHOLHDL, LDLDIRECT in the last 72 hours.  Thyroid function studies: No results for input(s): TSH, T4TOTAL, T3FREE, THYROIDAB in the last 72 hours.  Invalid input(s): FREET3 Anemia work up: No results for input(s): VITAMINB12, FOLATE, FERRITIN, TIBC, IRON, RETICCTPCT in the last 72 hours. Sepsis Labs: Recent Labs  Lab 12/02/20 2225 12/03/20 0126 12/03/20 0515 12/04/20 0508 12/05/20 0518 12/06/20 0212 12/07/20 0018  PROCALCITON  --   --  9.18  --   --   --   --   WBC  --   --  5.1 8.9 6.6 6.8 6.4  LATICACIDVEN 1.7 2.2*  1.4  --   --   --   --     Microbiology Recent Results (from the past 240 hour(s))  Blood Culture (routine x 2)     Status: None   Collection Time: 12/02/20  8:50 PM   Specimen: BLOOD LEFT HAND  Result Value Ref Range Status   Specimen Description BLOOD LEFT HAND  Final   Special Requests   Final    BOTTLES DRAWN AEROBIC AND ANAEROBIC Blood Culture adequate volume   Culture   Final    NO GROWTH 5 DAYS Performed at [facility], [address], [place], [place] [address]    Report Status 12/07/2020 FINAL  Final  Blood Culture (routine x 2)     Status: None   Collection Time: 12/02/20  9:15 PM   Specimen: BLOOD RIGHT WRIST  Result Value Ref Range Status   Specimen Description BLOOD RIGHT WRIST  Final   Special Requests   Final    BOTTLES DRAWN AEROBIC AND ANAEROBIC Blood Culture adequate volume   Culture   Final    NO GROWTH 5 DAYS Performed at [facility], [address] [place], [place] [address]    Report Status 12/07/2020 FINAL  Final  Resp Panel by RT-PCR (Flu A&B, Covid) Nasopharyngeal Swab     Status: None   Collection Time: 12/02/20 11:09 PM   Specimen: Nasopharyngeal Swab; Nasopharyngeal(NP) swabs in vial transport medium  Result Value Ref Range Status   SARS Coronavirus 2 by RT PCR NEGATIVE NEGATIVE Final    Comment: (NOTE) SARS-CoV-2 target nucleic acids are NOT DETECTED.  The SARS-CoV-2 RNA is generally detectable in upper respiratory specimens during the acute phase of infection. The lowest concentration of SARS-CoV-2 viral copies this assay can detect is 138 copies/mL. A negative result does not preclude SARS-Cov-2 infection and should not be used as the sole basis for treatment or other patient management decisions. A negative result may occur with  improper specimen collection/handling, submission of specimen other than nasopharyngeal swab, presence of viral mutation(s) within the areas targeted by this assay, and inadequate number of  viral copies(<138 copies/mL). A negative result must be combined with clinical observations, patient history, and epidemiological information. The expected result is Negative.  Fact Sheet for Patients:  BloggerCourse.com  Fact Sheet for Healthcare Providers:  SeriousBroker.it  This test is no t yet approved or cleared by the [place] FDA and  has been authorized for detection and/or diagnosis of SARS-CoV-2 by FDA under an Emergency Use Authorization (EUA). This EUA will remain  in effect (meaning this test can be used) for the duration of the COVID-19 declaration under Section 564(b)(1) of the Act, 21 U.S.C.section 360bbb-3(b)(1), unless the authorization is terminated  or revoked sooner.  Influenza A by PCR NEGATIVE NEGATIVE Final   Influenza B by PCR NEGATIVE NEGATIVE Final    Comment: (NOTE) The Xpert Xpress SARS-CoV-2/FLU/RSV plus assay is intended as an aid in the diagnosis of influenza from Nasopharyngeal swab specimens and should not be used as a sole basis for treatment. Nasal washings and aspirates are unacceptable for Xpert Xpress SARS-CoV-2/FLU/RSV testing.  Fact Sheet for Patients: BloggerCourse.com  Fact Sheet for Healthcare Providers: SeriousBroker.it  This test is not yet approved or cleared by the [place] FDA and has been authorized for detection and/or diagnosis of SARS-CoV-2 by FDA under an Emergency Use Authorization (EUA). This EUA will remain in effect (meaning this test can be used) for the duration of the COVID-19 declaration under Section 564(b)(1) of the Act, 21 U.S.C. section 360bbb-3(b)(1), unless the authorization is terminated or revoked.  Performed at [facility], [address], [place], [place] [address]   Urine culture     Status: None   Collection Time: 12/03/20 12:17 AM   Specimen: In/Out Cath Urine  Result Value  Ref Range Status   Specimen Description IN/OUT CATH URINE  Final   Special Requests NONE  Final   Culture   Final    NO GROWTH Performed at [facility], [address] [place], [place] [address]    Report Status 12/04/2020 FINAL  Final  MRSA Next Gen by PCR, Nasal     Status: None   Collection Time: 12/03/20  2:18 AM  Result Value Ref Range Status   MRSA by PCR Next Gen NOT DETECTED NOT DETECTED Final    Comment: (NOTE) The GeneXpert MRSA Assay (FDA approved for NASAL specimens only), is one component of a comprehensive MRSA colonization surveillance program. It is not intended to diagnose MRSA infection nor to guide or monitor treatment for MRSA infections. Test performance is not FDA approved in patients less than 48 years old. Performed at [facility], [address] [place], [place] [address]     Procedures and diagnostic studies:  MR BRAIN WO CONTRAST  Result Date: 12/06/2020 CLINICAL DATA:  History of nausea and vomiting. Altered mental status. Left eye vision disturbance. EXAM: MRI HEAD WITHOUT CONTRAST TECHNIQUE: Multiplanar, multiecho pulse sequences of the brain and surrounding structures were obtained without intravenous contrast. COMPARISON:  Head CT 2 days ago. FINDINGS: The study suffers from moderate motion degradation. Brain: Diffusion imaging does not show any acute or subacute infarction. The brainstem and cerebellum are normal. Cerebral hemispheres are normal for age. No sign of small vessel change. No cortical or large vessel territory infarction. Mass lesion, hemorrhage, hydrocephalus or extra-axial collection. Vascular: Major vessels at the base of the brain show flow. Skull and upper cervical spine: Negative Sinuses/Orbits: Clear/normal Other: None IMPRESSION: Motion degraded study. No acute abnormality suspected. No abnormality seen to explain the presenting symptoms. Brain appears normal for age. Electronically Signed   By: Paulina Fusi M.D.   On:  12/06/2020 12:49         LOS: 4 days   Ahtziri Jeffries A Zylah Elsbernd  [facility] [employer] loss adjuster on [email or web address]. If 7PM-7AM, please contact night-coverage at [email or web address]     12/07/2020, 1:39 PM

## 2020-12-08 LAB — COMPREHENSIVE METABOLIC PANEL
ALT: 78 U/L — ABNORMAL HIGH (ref 0–44)
AST: 94 U/L — ABNORMAL HIGH (ref 15–41)
Albumin: 2.4 g/dL — ABNORMAL LOW (ref 3.5–5.0)
Alkaline Phosphatase: 356 U/L — ABNORMAL HIGH (ref 38–126)
Anion gap: 10 (ref 5–15)
BUN: 5 mg/dL — ABNORMAL LOW (ref 8–23)
CO2: 29 mmol/L (ref 22–32)
Calcium: 8.4 mg/dL — ABNORMAL LOW (ref 8.9–10.3)
Chloride: 100 mmol/L (ref 98–111)
Creatinine, Ser: 0.66 mg/dL (ref 0.44–1.00)
GFR, Estimated: >60 mL/min (ref >60)
Glucose, Bld: 107 mg/dL — ABNORMAL HIGH (ref 70–99)
Potassium: 3.4 mmol/L — ABNORMAL LOW (ref 3.5–5.1)
Sodium: 139 mmol/L (ref 135–145)
Total Bilirubin: 1.3 mg/dL — ABNORMAL HIGH (ref 0.3–1.2)
Total Protein: 6.3 g/dL — ABNORMAL LOW (ref 6.5–8.1)

## 2020-12-08 LAB — CBC
HCT: 32.5 % — ABNORMAL LOW (ref 36.0–46.0)
Hemoglobin: 11.2 g/dL — ABNORMAL LOW (ref 12.0–15.0)
MCH: 28.4 pg (ref 26.0–34.0)
MCHC: 34.5 g/dL (ref 30.0–36.0)
MCV: 82.5 fL (ref 80.0–100.0)
Platelets: 256 10*3/uL (ref 150–400)
RBC: 3.94 MIL/uL (ref 3.87–5.11)
RDW: 16.2 % — ABNORMAL HIGH (ref 11.5–15.5)
WBC: 6.2 10*3/uL (ref 4.0–10.5)
nRBC: 0 % (ref 0.0–0.2)

## 2020-12-08 LAB — MAGNESIUM: Magnesium: 1.6 mg/dL — ABNORMAL LOW (ref 1.7–2.4)

## 2020-12-08 LAB — URINALYSIS, ROUTINE W REFLEX MICROSCOPIC
Bacteria, UA: NONE SEEN
Bilirubin Urine: NEGATIVE
Glucose, UA: NEGATIVE mg/dL
Ketones, ur: 20 mg/dL — AB
Leukocytes,Ua: NEGATIVE
Nitrite: NEGATIVE
Protein, ur: NEGATIVE mg/dL
Specific Gravity, Urine: 1.009 (ref 1.005–1.030)
pH: 8 (ref 5.0–8.0)

## 2020-12-08 LAB — PHOSPHORUS: Phosphorus: 3.7 mg/dL (ref 2.5–4.6)

## 2020-12-08 MED ORDER — METRONIDAZOLE 500 MG PO TABS
500.0000 mg | ORAL_TABLET | Freq: Three times a day (TID) | ORAL | Status: DC
  Administered 2020-12-08 – 2020-12-11 (×10): 500 mg via ORAL
  Filled 2020-12-08 (×9): qty 1

## 2020-12-08 MED ORDER — CIPROFLOXACIN HCL 500 MG PO TABS
500.0000 mg | ORAL_TABLET | Freq: Two times a day (BID) | ORAL | Status: DC
  Administered 2020-12-08 – 2020-12-11 (×6): 500 mg via ORAL
  Filled 2020-12-08 (×6): qty 1

## 2020-12-08 NOTE — [place] FL2 (Signed)
[place] MEDICAID FL2 LEVEL OF CARE SCREENING TOOL     IDENTIFICATION  Patient Name: Angela Ramirez Birthdate: 10/13/1945 Sex: female Admission Date (Current Location): 12/02/2020  [facility] and [place] Number:  Producer, television/film/video and Address:  [facility], [address] [place], [place] [address]      Provider Number: [id number]  Attending Physician Name and Address:  Kendell Bane, MD  Relative Name and Phone Number:  [name], son, [phone]    Current Level of Care: Hospital Recommended Level of Care: Skilled Nursing Facility Prior Approval Number:    Date Approved/Denied:   PASRR Number: [id number]  Discharge Plan: SNF    Current Diagnoses: Patient Active Problem List   Diagnosis Date Noted   Diverticulitis large intestine 12/06/2020   Severe sepsis (HCC) 12/03/2020   Painful urination 11/10/2020   Left foot pain 07/14/2020   Situational anxiety 07/14/2020   Allergic rhinitis 02/11/2020   Dysfunction of Eustachian tube, bilateral 02/11/2020   Vertigo 02/11/2020   Constipation 08/20/2019   GERD (gastroesophageal reflux disease) 02/19/2019   Travel advice encounter 05/07/2018   Finger pain, right 02/20/2018   Low back pain 02/20/2018   Liver fibrosis 10/09/2017   Need for prophylactic vaccination and inoculation against viral hepatitis 10/09/2017   Hep C w/o coma, chronic (HCC) 08/26/2017   Routine general medical examination at a health care facility 08/18/2017   Right shoulder pain 08/18/2017   Essential hypertension 09/21/2015   Arnold-Chiari malformation (HCC) 09/21/2015   Insomnia 09/21/2015   PTSD (post-traumatic stress disorder) 09/21/2015    Orientation RESPIRATION BLADDER Height & Weight     Self, Time, Situation, Place  Normal Incontinent, External catheter Weight: 194 lb (88 kg) Height:  5\' 7"  (170.2 cm)  BEHAVIORAL SYMPTOMS/MOOD NEUROLOGICAL BOWEL NUTRITION STATUS      Continent Diet (Please see DC  Summary)  AMBULATORY STATUS COMMUNICATION OF NEEDS Skin   Extensive Assist Verbally Normal                       Personal Care Assistance Level of Assistance  Bathing, Feeding, Dressing Bathing Assistance: Maximum assistance Feeding assistance: Limited assistance Dressing Assistance: Limited assistance     Functional Limitations Info  Sight Sight Info: Impaired (Blind in left eye)        SPECIAL CARE FACTORS FREQUENCY  PT (By licensed PT), OT (By licensed OT)     PT Frequency: 5x/week OT Frequency: 5x/week            Contractures Contractures Info: Not present    Additional Factors Info  Code Status, Allergies Code Status Info: Full Allergies Info: Crab (Diagnostic), Morphine And Related, Sulfa Antibiotics           Current Medications (12/08/2020):  This is the current hospital active medication list Current Facility-Administered Medications  Medication Dose Route Frequency Provider Last Rate Last Admin   0.9 %  sodium chloride infusion   Intravenous PRN 12/10/2020, DO   Stopped at 12/04/20 1413   acetaminophen (TYLENOL) tablet 650 mg  650 mg Per Tube Q6H PRN 12/06/20, DO   650 mg at 12/06/20 2112   Or   acetaminophen (TYLENOL) suppository 650 mg  650 mg Rectal Q6H PRN 2113, DO       acidophilus (RISAQUAD) capsule 1 capsule  1 capsule Oral TID Briant Sites A, MD   1 capsule at 12/08/20 0820   amLODipine (NORVASC) tablet 10 mg  10 mg Oral  Daily Lurene Shadow, MD   10 mg at 12/08/20 0820   Chlorhexidine Gluconate Cloth 2 % PADS 6 each  6 each Topical Daily Briant Sites, DO   6 each at 12/08/20 0821   enoxaparin (LOVENOX) injection 40 mg  40 mg Subcutaneous QHS Briant Sites, DO   40 mg at 12/07/20 2135   labetalol (NORMODYNE) injection 10 mg  10 mg Intravenous Q2H PRN Briant Sites, DO       linaclotide Karlene Einstein) capsule 145 mcg  145 mcg Oral QAC breakfast Lurene Shadow, MD   145 mcg at 12/08/20 0820   MEDLINE  mouth rinse  15 mL Mouth Rinse BID Briant Sites, DO   15 mL at 12/08/20 0821   ondansetron (ZOFRAN) tablet 4 mg  4 mg Per Tube Q6H PRN Briant Sites, DO   4 mg at 12/07/20 1832   Or   ondansetron (ZOFRAN) injection 4 mg  4 mg Intravenous Q6H PRN Briant Sites, DO   4 mg at 12/08/20 0532   phenazopyridine (PYRIDIUM) tablet 200 mg  200 mg Oral TID WC Shahmehdi, Seyed A, MD   200 mg at 12/08/20 0820   polyethylene glycol (MIRALAX / GLYCOLAX) packet 17 g  17 g Oral Daily Lurene Shadow, MD         Discharge Medications: Please see discharge summary for a list of discharge medications.  Relevant Imaging Results:  Relevant Lab Results:   Additional Information SSN: [id number]. Pfizer COVID-19 Vaccine 08/06/2019 , 07/16/2019, no booster in system  [employer], LCSW

## 2020-12-08 NOTE — TOC Initial Note (Signed)
Transition of Care [facility]) - Initial/Assessment Note    Patient Details  Name: Angela Ramirez MRN: [id number] Date of Birth: 03/04/1946  [facility] CM/SW Contact:    Mearl Latin, LCSW Phone Number: 12/08/2020, 10:53 AM  Clinical Narrative:                 CSW received consult for possible SNF placement at time of discharge. CSW spoke with patient and her son, Harvie Heck, at bedside. Patient was independent prior to this hospitalization. Patient expressed understanding of PT recommendation and is hopeful to return home if a good SNF does not have a bed available. Son is aware of the limited level of services that home health provides if they decide to go that route. CSW discussed insurance authorization process and provided Medicare SNF ratings list in case patient decides on SNF. Patient has received the COVID vaccines and booster (though booster is not in  system, patient says she has card). Patient expressed being hopeful for rehab and to feel better soon. No further questions reported at this time. Per MD, patient likely discharging Monday so encouraged patient to have decision by Sunday so insurance approval can be obtained.    Expected Discharge Plan: Skilled Nursing Facility Barriers to Discharge: Continued Medical Work up, English as a second language teacher   Patient Goals and CMS Choice Patient states their goals for this hospitalization and ongoing recovery are:: Rehab CMS Medicare.gov Compare Post Acute Care list provided to:: Patient Choice offered to / list presented to : Patient, Adult Children  Expected Discharge Plan and Services Expected Discharge Plan: Skilled Nursing Facility In-house Referral: Clinical Social Work   Post Acute Care Choice: Skilled Nursing Facility Living arrangements for the past 2 months: Apartment                                      Prior Living Arrangements/Services Living arrangements for the past 2 months: Apartment Lives with::  Self Patient language and need for interpreter reviewed:: Yes Do you feel safe going back to the place where you live?: Yes      Need for Family Participation in Patient Care: Yes (Comment) Care giver support system in place?: No (comment)   Criminal Activity/Legal Involvement Pertinent to Current Situation/Hospitalization: No - Comment as needed  Activities of Daily Living      Permission Sought/Granted Permission sought to share information with : Facility Medical sales representative, Family Supports Permission granted to share information with : Yes, Verbal Permission Granted  Share Information with NAME: [name]  Permission granted to share info w AGENCY: SNFs  Permission granted to share info w Relationship: Son  Permission granted to share info w Contact Information: [phone]  Emotional Assessment Appearance:: Appears stated age Attitude/Demeanor/Rapport: Engaged Affect (typically observed): Accepting, Appropriate Orientation: : Oriented to Self, Oriented to Place, Oriented to  Time, Oriented to Situation Alcohol / Substance Use: Not Applicable Psych Involvement: No (comment)  Admission diagnosis:  Dehydration [E86.0] Hypokalemia [E87.6] Elevated liver function tests [R79.89] Acute febrile illness [R50.9] Generalized weakness [R53.1] Sepsis (HCC) [A41.9] Diarrhea, unspecified type [R19.7] Patient Active Problem List   Diagnosis Date Noted   Diverticulitis large intestine 12/06/2020   Severe sepsis (HCC) 12/03/2020   Painful urination 11/10/2020   Left foot pain 07/14/2020   Situational anxiety 07/14/2020   Allergic rhinitis 02/11/2020   Dysfunction of Eustachian tube, bilateral 02/11/2020   Vertigo 02/11/2020   Constipation 08/20/2019  GERD (gastroesophageal reflux disease) 02/19/2019   Travel advice encounter 05/07/2018   Finger pain, right 02/20/2018   Low back pain 02/20/2018   Liver fibrosis 10/09/2017   Need for prophylactic vaccination and inoculation  against viral hepatitis 10/09/2017   Hep C w/o coma, chronic (HCC) 08/26/2017   Routine general medical examination at a health care facility 08/18/2017   Right shoulder pain 08/18/2017   Essential hypertension 09/21/2015   Arnold-Chiari malformation (HCC) 09/21/2015   Insomnia 09/21/2015   PTSD (post-traumatic stress disorder) 09/21/2015   PCP:  Myrlene Broker, MD Pharmacy:   [id number] [place],  - [address] AT [facility] [address] & [facility] [address]  [place] [address] Phone: [phone] Fax: [phone]     Social Determinants of Health (SDOH) Interventions    Readmission Risk Interventions No flowsheet data found.

## 2020-12-08 NOTE — Plan of Care (Signed)
  Problem: Education: Goal: Knowledge of General Education information will improve Description: Including pain rating scale, medication(s)/side effects and non-pharmacologic comfort measures Outcome: Progressing   Problem: Health Behavior/Discharge Planning: Goal: Ability to manage health-related needs will improve Outcome: Progressing   Problem: Clinical Measurements: Goal: Ability to maintain clinical measurements within normal limits will improve Outcome: Progressing Goal: Will remain free from infection Outcome: Progressing Goal: Diagnostic test results will improve Outcome: Progressing Goal: Respiratory complications will improve Outcome: Progressing Goal: Cardiovascular complication will be avoided Outcome: Progressing   Problem: Nutrition: Goal: Adequate nutrition will be maintained Outcome: Progressing   Problem: Activity: Goal: Risk for activity intolerance will decrease Outcome: Progressing   Problem: Coping: Goal: Level of anxiety will decrease Outcome: Progressing   Problem: Elimination: Goal: Will not experience complications related to bowel motility Outcome: Progressing Goal: Will not experience complications related to urinary retention Outcome: Progressing   Problem: Safety: Goal: Ability to remain free from injury will improve Outcome: Progressing   Problem: Skin Integrity: Goal: Risk for impaired skin integrity will decrease Outcome: Progressing   

## 2020-12-08 NOTE — Care Management Important Message (Signed)
Important Message  Patient Details  Name: Angela Ramirez MRN: [id number] Date of Birth: Nov 08, 1945   Medicare Important Message Given:  Yes - Important Message mailed due to current National Emergency   Verbal consent obtained due to current National Emergency  Relationship to patient: Child Contact Name: Bufford Lope Call Date: 12/08/20  Time: 1145 Phone: [phone] Outcome: Spoke with contact Important Message mailed to: Patient address on file   Orson Aloe 12/08/2020, 11:48 AM

## 2020-12-08 NOTE — Progress Notes (Signed)
Progress Note    Angela Ramirez  ZOX:[id number] DOB: Jun 30, 1945  DOA: 12/02/2020 PCP: Myrlene Broker, MD      Brief Narrative:    Medical records reviewed and are as summarized below:  Angela Ramirez is a 75 y.o. female with medical history significant for hypertension, hepatitis C, liver cirrhosis, depression, anxiety, diverticulosis, IBS, Arnold-Chiari malformation, recent left eye surgery, recent treatment of UTI with Macrobid, who presented to the hospital with nausea and vomiting.  She was febrile, tachycardic, tachypneic and hypotensive in the emergency room.  She was admitted to the hospital for severe sepsis secondary to acute diverticulitis complicated by acute hypoxic respiratory failure, acute metabolic encephalopathy and acute kidney injury.  She was admitted to the ICU for management.  She was intubated and placed on mechanical ventilation for acute respiratory failure.  She was treated with empiric IV antibiotics and IV fluids.  Her condition improved and she was transferred to [facility] hospitalist service on 12/06/2020.     Subjective:  The patient was seen and examined this morning, much more awake alert, following command, off oxygen Denies any chest pain or shortness of breath, reporting improved abdominal pain, pain medication helping Complaining of significant generalized weakness unable to stand independently unable to carry her ADLs independently.   Assessment/Plan:   Principal Problem:   Severe sepsis (HCC) Active Problems:   Diverticulitis large intestine  Severe sepsis secondary to acute diverticulitis:  Hemodynamically stable Much improved sepsis pathophysiology, hemodynamically stable now, Continue empiric IV antibiotics Unasyn switched to p.o. Cipro Flagyl today 12/09/2018 Anticipating total of 14 days of antibiotic coverage  Acute hypoxic respiratory failure:  Resolved S/p intubated on 12/04/2019 extubated on 12/04/2020.   On room air  satting greater 90%  AKI: Resolved  Hypokalemia and hyponatremia: Resolved  History of cataract, Abormal left eye vision:  MRI brain did not show any acute stroke.  She has abnormal left eye vision at baseline but vision is worsen on this admission.   Case was discussed with ophthalmologist, Dr. [name].  He said evaluation of the left eye will be limited because he does not have all the requested equipment to do a thorough assessment in the hospital.  He recommended outpatient follow-up.  He requested her son's contact information so that he could discuss this with the patient's son. No further changes in visual acuity  Elevated liver enzymes, hepatitis C, liver cirrhosis: Compensated  Generalized weakness: PT and OT evaluation--- significant weakness is noted--recommending SNF  Constipation: Ordered laxatives  Hypertension: Resume home amlodipine.  Hold HCTZ.  Body mass index is 30.8 kg/m.  (Obesity)  Diet Order             DIET SOFT Room service appropriate? Yes; Fluid consistency: Thin  Diet effective now                  Consultants: Intensivist  Procedures: Intubation and mechanical ventilation    Medications:    acidophilus  1 capsule Oral TID   amLODipine  10 mg Oral Daily   Chlorhexidine Gluconate Cloth  6 each Topical Daily   ciprofloxacin  500 mg Oral BID   enoxaparin (LOVENOX) injection  40 mg Subcutaneous QHS   linaclotide  145 mcg Oral QAC breakfast   mouth rinse  15 mL Mouth Rinse BID   metroNIDAZOLE  500 mg Oral Q8H   phenazopyridine  200 mg Oral TID WC   polyethylene glycol  17 g Oral Daily   Continuous Infusions:  sodium chloride Stopped (12/04/20 1413)     Anti-infectives (From admission, onward)    Start     Dose/Rate Route Frequency Ordered Stop   12/08/20 1200  ciprofloxacin (CIPRO) tablet 500 mg        500 mg Oral 2 times daily 12/08/20 1113 12/16/20 0759   12/08/20 1200  metroNIDAZOLE (FLAGYL) tablet 500 mg        500 mg  Oral Every 8 hours 12/08/20 1113 12/16/20 1359   12/07/20 1845  fluconazole (DIFLUCAN) tablet 200 mg  Status:  Discontinued        200 mg Oral Daily 12/07/20 1746 12/08/20 0745   12/04/20 1400  Ampicillin-Sulbactam (UNASYN) 3 g in sodium chloride 0.9 % 100 mL IVPB        3 g 200 mL/hr over 30 Minutes Intravenous Every 6 hours 12/04/20 0959 12/07/20 2025   12/03/20 2200  vancomycin (VANCOREADY) IVPB 1000 mg/200 mL  Status:  Discontinued        1,000 mg 200 mL/hr over 60 Minutes Intravenous Every 24 hours 12/02/20 2228 12/03/20 1009   12/03/20 1000  ceFEPIme (MAXIPIME) 2 g in sodium chloride 0.9 % 100 mL IVPB  Status:  Discontinued        2 g 200 mL/hr over 30 Minutes Intravenous Every 12 hours 12/02/20 2228 12/03/20 0828   12/03/20 0800  metroNIDAZOLE (FLAGYL) IVPB 500 mg  Status:  Discontinued        500 mg 100 mL/hr over 60 Minutes Intravenous Every 8 hours 12/03/20 0029 12/03/20 1009   12/03/20 0600  piperacillin-tazobactam (ZOSYN) IVPB 3.375 g  Status:  Discontinued        3.375 g 12.5 mL/hr over 240 Minutes Intravenous Every 8 hours 12/03/20 0159 12/04/20 0959   12/03/20 0030  metroNIDAZOLE (FLAGYL) IVPB 500 mg        500 mg 100 mL/hr over 60 Minutes Intravenous  Once 12/03/20 0016 12/03/20 0210   12/02/20 2130  vancomycin (VANCOREADY) IVPB 1000 mg/200 mL  Status:  Discontinued        1,000 mg 200 mL/hr over 60 Minutes Intravenous  Once 12/02/20 2054 12/02/20 2057   12/02/20 2130  vancomycin (VANCOREADY) IVPB 1750 mg/350 mL        1,750 mg 175 mL/hr over 120 Minutes Intravenous  Once 12/02/20 2057 12/03/20 0001   12/02/20 2100  ceFEPIme (MAXIPIME) 2 g in sodium chloride 0.9 % 100 mL IVPB        2 g 200 mL/hr over 30 Minutes Intravenous  Once 12/02/20 2054 12/02/20 2206        Family Communication/Anticipated D/C date and plan/Code Status   DVT prophylaxis: enoxaparin (LOVENOX) injection 40 mg Start: 12/03/20 0045     Code Status: Full Code  Family Communication: Son  present at bedside Disposition Plan:    Status is: Inpatient  Remains inpatient appropriate because:IV treatments appropriate due to intensity of illness or inability to take PO  Dispo: The patient is from: Home  Anticipated d/c is to: PT recommending SNF, after discussion with son and the patient, will explore further options SNF versus home with home health   .... Likely discharge date 1-2 days              Patient currently is not medically stable to d/c.  Also unsafe to be discharged home   Difficult to place patient No         Objective:    Vitals:   12/07/20 1936 12/08/20 0400 12/08/20  0439 12/08/20 1229  BP: (!) 155/73 (!) 144/64  130/75  Pulse: 77 77  95  Resp: 14 19 (!) 21 20  Temp: 97.8 F (36.6 C) 98.1 F (36.7 C)  97.8 F (36.6 C)  TempSrc: Oral Oral  Oral  SpO2: 97% 95%  97%  Weight:   88 kg   Height:       No data found.   Intake/Output Summary (Last 24 hours) at 12/08/2020 1233 Last data filed at 12/08/2020 1139 Gross per 24 hour  Intake 354 ml  Output 2650 ml  Net -2296 ml   Filed Weights   12/02/20 2201 12/04/20 0530 12/08/20 0439  Weight: 85 kg 89.2 kg 88 kg       Physical Exam:   General:  Alert, oriented, cooperative, no distress;   HEENT:  Normocephalic, PERRL, otherwise with in Normal limits   Neuro:  CNII-XII intact. , normal motor and sensation, reflexes intact   Lungs:   Clear to auscultation BL, Respirations unlabored, no wheezes / crackles  Cardio:    S1/S2, RRR, No murmure, No Rubs or Gallops   Abdomen:   Soft, non-tender, bowel sounds active all four quadrants,  no guarding or peritoneal signs.  Muscular skeletal:  Limited exam - in bed, able to move all 4 extremities, severe generalized weaknesses, mbulate only with assistance 2+ pulses,  symmetric, No pitting edema  Skin:  Dry, warm to touch, negative for any Rashes,  Wounds: Please see nursing documentation           Data Reviewed:   I have personally reviewed  following labs and imaging studies:  Labs: Labs show the following:   Basic Metabolic Panel: Recent Labs  Lab 12/04/20 0508 12/05/20 0518 12/06/20 0212 12/07/20 0018 12/08/20 0020  NA 133* 134* 136 139 139  K 4.2 3.3* 3.8 3.1* 3.4*  CL 102 103 105 104 100  CO2 20* 21* 23 28 29   GLUCOSE 79 87 90 111* 107*  BUN 26* 18 14 7* 5*  CREATININE 1.27* 0.89 0.75 0.64 0.66  CALCIUM 8.3* 8.3* 8.4* 8.2* 8.4*  MG 2.0 2.1 1.9 1.7 1.6*  PHOS 3.9 3.1 3.6 4.5 3.7   GFR Estimated Creatinine Clearance: 72.5 mL/min (by C-G formula based on SCr of 0.66 mg/dL). Liver Function Tests: Recent Labs  Lab 12/03/20 0515 12/05/20 0518 12/06/20 0212 12/07/20 0018 12/08/20 0020  AST 205* 207* 206* 162* 94*  ALT 101* 109* 104* 98* 78*  ALKPHOS 92 187* 267* 379* 356*  BILITOT 1.5* 1.7* 1.5* 1.0 1.3*  PROT 6.3* 5.7* 5.6* 5.9* 6.3*  ALBUMIN 2.5* 2.1* 2.0* 2.2* 2.4*   No results for input(s): LIPASE, AMYLASE in the last 168 hours. Recent Labs  Lab 12/03/20 0123 12/03/20 0515  AMMONIA 15 27   Coagulation profile Recent Labs  Lab 12/03/20 0515 12/04/20 0508  INR 1.5* 1.6*    CBC: Recent Labs  Lab 12/02/20 2054 12/03/20 0055 12/04/20 0508 12/05/20 0518 12/06/20 0212 12/07/20 0018 12/08/20 0020  WBC 7.1   < > 8.9 6.6 6.8 6.4 6.2  NEUTROABS 6.2  --   --   --   --   --   --   HGB 12.0   < > 10.6* 9.6* 9.4* 9.6* 11.2*  HCT 36.3   < > 33.3* 28.8* 28.0* 28.6* 32.5*  MCV 84.6   < > 87.9 84.0 84.8 84.1 82.5  PLT 181   < > 156 157 186 224 256   < > = values  in this interval not displayed.   Cardiac Enzymes: Recent Labs  Lab 12/03/20 0123  CKTOTAL 1,075*   BNP (last 3 results) No results for input(s): PROBNP in the last 8760 hours. CBG: Recent Labs  Lab 12/03/20 1515 12/03/20 1957 12/03/20 2322 12/04/20 0313 12/04/20 2139  GLUCAP 101* 100* 77 81 85   D-Dimer: No results for input(s): DDIMER in the last 72 hours. Hgb A1c: No results for input(s): HGBA1C in the last 72  hours. Lipid Profile: No results for input(s): CHOL, HDL, LDLCALC, TRIG, CHOLHDL, LDLDIRECT in the last 72 hours.  Thyroid function studies: No results for input(s): TSH, T4TOTAL, T3FREE, THYROIDAB in the last 72 hours.  Invalid input(s): FREET3 Anemia work up: No results for input(s): VITAMINB12, FOLATE, FERRITIN, TIBC, IRON, RETICCTPCT in the last 72 hours. Sepsis Labs: Recent Labs  Lab 12/02/20 2225 12/03/20 0126 12/03/20 0515 12/04/20 0508 12/05/20 0518 12/06/20 0212 12/07/20 0018 12/08/20 0020  PROCALCITON  --   --  9.18  --   --   --   --   --   WBC  --   --  5.1   < > 6.6 6.8 6.4 6.2  LATICACIDVEN 1.7 2.2* 1.4  --   --   --   --   --    < > = values in this interval not displayed.    Microbiology Recent Results (from the past 240 hour(s))  Blood Culture (routine x 2)     Status: None   Collection Time: 12/02/20  8:50 PM   Specimen: BLOOD LEFT HAND  Result Value Ref Range Status   Specimen Description BLOOD LEFT HAND  Final   Special Requests   Final    BOTTLES DRAWN AEROBIC AND ANAEROBIC Blood Culture adequate volume   Culture   Final    NO GROWTH 5 DAYS Performed at [facility], [address], [place], [place] [address]    Report Status 12/07/2020 FINAL  Final  Blood Culture (routine x 2)     Status: None   Collection Time: 12/02/20  9:15 PM   Specimen: BLOOD RIGHT WRIST  Result Value Ref Range Status   Specimen Description BLOOD RIGHT WRIST  Final   Special Requests   Final    BOTTLES DRAWN AEROBIC AND ANAEROBIC Blood Culture adequate volume   Culture   Final    NO GROWTH 5 DAYS Performed at [facility], [address] [place], [place] [address]    Report Status 12/07/2020 FINAL  Final  Resp Panel by RT-PCR (Flu A&B, Covid) Nasopharyngeal Swab     Status: None   Collection Time: 12/02/20 11:09 PM   Specimen: Nasopharyngeal Swab; Nasopharyngeal(NP) swabs in vial transport medium  Result Value Ref Range Status   SARS Coronavirus 2 by RT  PCR NEGATIVE NEGATIVE Final    Comment: (NOTE) SARS-CoV-2 target nucleic acids are NOT DETECTED.  The SARS-CoV-2 RNA is generally detectable in upper respiratory specimens during the acute phase of infection. The lowest concentration of SARS-CoV-2 viral copies this assay can detect is 138 copies/mL. A negative result does not preclude SARS-Cov-2 infection and should not be used as the sole basis for treatment or other patient management decisions. A negative result may occur with  improper specimen collection/handling, submission of specimen other than nasopharyngeal swab, presence of viral mutation(s) within the areas targeted by this assay, and inadequate number of viral copies(<138 copies/mL). A negative result must be combined with clinical observations, patient history, and epidemiological  information. The expected result is Negative.  Fact Sheet for Patients:  BloggerCourse.com  Fact Sheet for Healthcare Providers:  SeriousBroker.it  This test is no t yet approved or cleared by the [place] FDA and  has been authorized for detection and/or diagnosis of SARS-CoV-2 by FDA under an Emergency Use Authorization (EUA). This EUA will remain  in effect (meaning this test can be used) for the duration of the COVID-19 declaration under Section 564(b)(1) of the Act, 21 U.S.C.section 360bbb-3(b)(1), unless the authorization is terminated  or revoked sooner.       Influenza A by PCR NEGATIVE NEGATIVE Final   Influenza B by PCR NEGATIVE NEGATIVE Final    Comment: (NOTE) The Xpert Xpress SARS-CoV-2/FLU/RSV plus assay is intended as an aid in the diagnosis of influenza from Nasopharyngeal swab specimens and should not be used as a sole basis for treatment. Nasal washings and aspirates are unacceptable for Xpert Xpress SARS-CoV-2/FLU/RSV testing.  Fact Sheet for Patients: BloggerCourse.com  Fact Sheet for  Healthcare Providers: SeriousBroker.it  This test is not yet approved or cleared by the [place] FDA and has been authorized for detection and/or diagnosis of SARS-CoV-2 by FDA under an Emergency Use Authorization (EUA). This EUA will remain in effect (meaning this test can be used) for the duration of the COVID-19 declaration under Section 564(b)(1) of the Act, 21 U.S.C. section 360bbb-3(b)(1), unless the authorization is terminated or revoked.  Performed at [facility], [address], [place], [place] [address]   Urine culture     Status: None   Collection Time: 12/03/20 12:17 AM   Specimen: In/Out Cath Urine  Result Value Ref Range Status   Specimen Description IN/OUT CATH URINE  Final   Special Requests NONE  Final   Culture   Final    NO GROWTH Performed at [facility], [address] [place], [place] [address]    Report Status 12/04/2020 FINAL  Final  MRSA Next Gen by PCR, Nasal     Status: None   Collection Time: 12/03/20  2:18 AM  Result Value Ref Range Status   MRSA by PCR Next Gen NOT DETECTED NOT DETECTED Final    Comment: (NOTE) The GeneXpert MRSA Assay (FDA approved for NASAL specimens only), is one component of a comprehensive MRSA colonization surveillance program. It is not intended to diagnose MRSA infection nor to guide or monitor treatment for MRSA infections. Test performance is not FDA approved in patients less than 87 years old. Performed at [facility], [address], [place], [place] [address]     Procedures and diagnostic studies:  No results found.   LOS: 5 days   Kalieb Freeland A Antonius Hartlage  [facility] Chartered loss adjuster on [email or web address]. If 7PM-7AM, please contact night-coverage at [email or web address]     12/08/2020, 12:33 PM

## 2020-12-08 NOTE — Progress Notes (Signed)
Inpatient Rehab Admissions Coordinator:   Consult received and chart reviewed.  Unfortunately this pt does not have the medical necessity to support an admission to acute inpatient rehab and I do not feel that we would be able to get authorization from her insurance carrier.  Note therapy recommendations are for SNF, and I agree with that recommendation.    Estill Dooms, PT, DPT Admissions Coordinator [phone] 12/08/20  12:11 PM

## 2020-12-09 DIAGNOSIS — J9601 Acute respiratory failure with hypoxia: Secondary | ICD-10-CM | POA: Diagnosis not present

## 2020-12-09 DIAGNOSIS — K7460 Unspecified cirrhosis of liver: Secondary | ICD-10-CM | POA: Diagnosis not present

## 2020-12-09 DIAGNOSIS — I10 Essential (primary) hypertension: Secondary | ICD-10-CM | POA: Diagnosis not present

## 2020-12-09 DIAGNOSIS — G9341 Metabolic encephalopathy: Secondary | ICD-10-CM | POA: Diagnosis not present

## 2020-12-09 DIAGNOSIS — K529 Noninfective gastroenteritis and colitis, unspecified: Secondary | ICD-10-CM | POA: Diagnosis not present

## 2020-12-09 LAB — COMPREHENSIVE METABOLIC PANEL
ALT: 61 U/L — ABNORMAL HIGH (ref 0–44)
AST: 58 U/L — ABNORMAL HIGH (ref 15–41)
Albumin: 2.5 g/dL — ABNORMAL LOW (ref 3.5–5.0)
Alkaline Phosphatase: 312 U/L — ABNORMAL HIGH (ref 38–126)
Anion gap: 9 (ref 5–15)
BUN: 10 mg/dL (ref 8–23)
CO2: 30 mmol/L (ref 22–32)
Calcium: 8.5 mg/dL — ABNORMAL LOW (ref 8.9–10.3)
Chloride: 100 mmol/L (ref 98–111)
Creatinine, Ser: 0.74 mg/dL (ref 0.44–1.00)
GFR, Estimated: >60 mL/min (ref >60)
Glucose, Bld: 117 mg/dL — ABNORMAL HIGH (ref 70–99)
Potassium: 3.2 mmol/L — ABNORMAL LOW (ref 3.5–5.1)
Sodium: 139 mmol/L (ref 135–145)
Total Bilirubin: 1 mg/dL (ref 0.3–1.2)
Total Protein: 6.5 g/dL (ref 6.5–8.1)

## 2020-12-09 LAB — CBC
HCT: 31.6 % — ABNORMAL LOW (ref 36.0–46.0)
Hemoglobin: 10.3 g/dL — ABNORMAL LOW (ref 12.0–15.0)
MCH: 27.8 pg (ref 26.0–34.0)
MCHC: 32.6 g/dL (ref 30.0–36.0)
MCV: 85.2 fL (ref 80.0–100.0)
Platelets: 340 10*3/uL (ref 150–400)
RBC: 3.71 MIL/uL — ABNORMAL LOW (ref 3.87–5.11)
RDW: 16.3 % — ABNORMAL HIGH (ref 11.5–15.5)
WBC: 7.6 10*3/uL (ref 4.0–10.5)
nRBC: 0 % (ref 0.0–0.2)

## 2020-12-09 LAB — PHOSPHORUS: Phosphorus: 3.4 mg/dL (ref 2.5–4.6)

## 2020-12-09 LAB — MAGNESIUM: Magnesium: 1.8 mg/dL (ref 1.7–2.4)

## 2020-12-09 LAB — GLUCOSE, CAPILLARY: Glucose-Capillary: 133 mg/dL — ABNORMAL HIGH (ref 70–99)

## 2020-12-09 MED ORDER — POTASSIUM CHLORIDE CRYS ER 20 MEQ PO TBCR
40.0000 meq | EXTENDED_RELEASE_TABLET | Freq: Once | ORAL | Status: AC
  Administered 2020-12-09: 40 meq via ORAL
  Filled 2020-12-09: qty 2

## 2020-12-09 MED ORDER — SODIUM CHLORIDE 0.45 % IV SOLN: INTRAVENOUS | Status: AC

## 2020-12-09 MED ORDER — MAGNESIUM SULFATE 2 GM/50ML IV SOLN
2.0000 g | Freq: Once | INTRAVENOUS | Status: AC
  Administered 2020-12-09: 2 g via INTRAVENOUS
  Filled 2020-12-09: qty 50

## 2020-12-09 NOTE — Progress Notes (Signed)
Progress Note    Angela Ramirez  NFA:[id number] DOB: 1946-05-10  DOA: 12/02/2020 PCP: Myrlene Brokerrawford, Elizabeth A, MD      Brief Narrative:    Medical records reviewed and are as summarized below:  Angela BlightSheila Ramirez is a 75 y.o. female with medical history significant for hypertension, hepatitis C, liver cirrhosis, depression, anxiety, diverticulosis, IBS, Arnold-Chiari malformation, recent left eye surgery, recent treatment of UTI with Macrobid, who presented to the hospital with nausea and vomiting.  She was febrile, tachycardic, tachypneic and hypotensive in the emergency room.  She was admitted to the hospital for severe sepsis secondary to acute diverticulitis complicated by acute hypoxic respiratory failure, acute metabolic encephalopathy and acute kidney injury.  She was admitted to the ICU for management.  She was intubated and placed on mechanical ventilation for acute respiratory failure.  She was treated with empiric IV antibiotics and IV fluids.  Her condition improved and she was transferred to [facility] hospitalist service on 12/06/2020.     Subjective:  The patient was seen and examined this morning, stable no acute distress, complaining of mild lower quadrant abdominal pain. No issues overnight, hemodynamically stable Also complaining of severe generalized weaknesses..  With no major improvement    Assessment/Plan:   Principal Problem:   Severe sepsis (HCC) Active Problems:   Diverticulitis large intestine  Severe sepsis secondary to acute diverticulitis:  afebrile normotensive, stable  much improved sepsis pathophysiology, hemodynamically stable now, Continue empiric IV antibiotics Unasyn switched to p.o. Cipro Flagyl today 12/09/2018 Anticipating total of 14 days of antibiotic coverage  Acute hypoxic respiratory failure:  Much improved, off oxygen supplement S/p intubated on 12/04/2019 extubated on 12/04/2020.   On room air satting > 90%  AKI:  Resolved  Hypokalemia / hypomagnesemia: Repleted with p.o. potassium, IV magnesium, hyponatremia improved  History of cataract, Abormal left eye vision:  MRI brain did not show any acute stroke.  She has abnormal left eye vision at baseline but vision is worsen on this admission.   Case was discussed with ophthalmologist, Dr. Delaney MeigsStonecipher.  He said evaluation of the left eye will be limited because he does not have all the requested equipment to do a thorough assessment in the hospital.  He recommended outpatient follow-up.  He requested her son's contact information so that he could discuss this with the patient's son. No further changes in visual acuity -Reporting of no progression of visual deficit  Elevated liver enzymes, hepatitis C, liver cirrhosis: Compensated  Generalized weakness: PT and OT evaluation--- significant weakness is noted--recommending SNF (Currently patient refusing SNF, willing to accept home health)  Constipation: Ordered laxatives  Hypertension: Resume home amlodipine.  Hold HCTZ.  Body mass index is 30.8 kg/m.  (Obesity)  Diet Order             DIET SOFT Room service appropriate? Yes; Fluid consistency: Thin  Diet effective now                  Consultants: Intensivist  Procedures: Intubation and mechanical ventilation    Medications:    acidophilus  1 capsule Oral TID   amLODipine  10 mg Oral Daily   Chlorhexidine Gluconate Cloth  6 each Topical Daily   ciprofloxacin  500 mg Oral BID   enoxaparin (LOVENOX) injection  40 mg Subcutaneous QHS   linaclotide  145 mcg Oral QAC breakfast   mouth rinse  15 mL Mouth Rinse BID   metroNIDAZOLE  500 mg Oral Q8H   phenazopyridine  200 mg Oral TID WC   polyethylene glycol  17 g Oral Daily   Continuous Infusions:  sodium chloride 100 mL/hr at 12/09/20 0847   sodium chloride Stopped (12/04/20 1413)     Anti-infectives (From admission, onward)    Start     Dose/Rate Route Frequency Ordered Stop    12/08/20 1200  ciprofloxacin (CIPRO) tablet 500 mg        500 mg Oral 2 times daily 12/08/20 1113 12/16/20 0759   12/08/20 1200  metroNIDAZOLE (FLAGYL) tablet 500 mg        500 mg Oral Every 8 hours 12/08/20 1113 12/16/20 1559   12/07/20 1845  fluconazole (DIFLUCAN) tablet 200 mg  Status:  Discontinued        200 mg Oral Daily 12/07/20 1746 12/08/20 0745   12/04/20 1400  Ampicillin-Sulbactam (UNASYN) 3 g in sodium chloride 0.9 % 100 mL IVPB        3 g 200 mL/hr over 30 Minutes Intravenous Every 6 hours 12/04/20 0959 12/07/20 2025   12/03/20 2200  vancomycin (VANCOREADY) IVPB 1000 mg/200 mL  Status:  Discontinued        1,000 mg 200 mL/hr over 60 Minutes Intravenous Every 24 hours 12/02/20 2228 12/03/20 1009   12/03/20 1000  ceFEPIme (MAXIPIME) 2 g in sodium chloride 0.9 % 100 mL IVPB  Status:  Discontinued        2 g 200 mL/hr over 30 Minutes Intravenous Every 12 hours 12/02/20 2228 12/03/20 0828   12/03/20 0800  metroNIDAZOLE (FLAGYL) IVPB 500 mg  Status:  Discontinued        500 mg 100 mL/hr over 60 Minutes Intravenous Every 8 hours 12/03/20 0029 12/03/20 1009   12/03/20 0600  piperacillin-tazobactam (ZOSYN) IVPB 3.375 g  Status:  Discontinued        3.375 g 12.5 mL/hr over 240 Minutes Intravenous Every 8 hours 12/03/20 0159 12/04/20 0959   12/03/20 0030  metroNIDAZOLE (FLAGYL) IVPB 500 mg        500 mg 100 mL/hr over 60 Minutes Intravenous  Once 12/03/20 0016 12/03/20 0210   12/02/20 2130  vancomycin (VANCOREADY) IVPB 1000 mg/200 mL  Status:  Discontinued        1,000 mg 200 mL/hr over 60 Minutes Intravenous  Once 12/02/20 2054 12/02/20 2057   12/02/20 2130  vancomycin (VANCOREADY) IVPB 1750 mg/350 mL        1,750 mg 175 mL/hr over 120 Minutes Intravenous  Once 12/02/20 2057 12/03/20 0001   12/02/20 2100  ceFEPIme (MAXIPIME) 2 g in sodium chloride 0.9 % 100 mL IVPB        2 g 200 mL/hr over 30 Minutes Intravenous  Once 12/02/20 2054 12/02/20 2206        Family  Communication/Anticipated D/C date and plan/Code Status   DVT prophylaxis: enoxaparin (LOVENOX) injection 40 mg Start: 12/03/20 0045     Code Status: Full Code  Family Communication: Son present at bedside Disposition Plan:    Status is: Inpatient  Remains inpatient appropriate because:IV treatments appropriate due to intensity of illness or inability to take PO  Disposition : patient is from: Home  Anticipated d/c is to: PT recommending SNF, after discussion with son and the patient, will explore further options SNF versus home with home health    Patient currently refusing SNF, excepting home health Anticipating discharge 12/11/2020 Home with home health               Patient currently is not  medically stable to d/c.  Also unsafe to be discharged home   Difficult to place patient No         Objective:    Vitals:   12/08/20 1229 12/08/20 2026 12/08/20 2050 12/09/20 0448  BP: 130/75  129/61 140/74  Pulse: 95 85 87 81  Resp: 20 16 19 14   Temp: 97.8 F (36.6 C)  98.3 F (36.8 C) 97.9 F (36.6 C)  TempSrc: Oral  Oral Oral  SpO2: 97% 94% 96% 97%  Weight:      Height:       No data found.   Intake/Output Summary (Last 24 hours) at 12/09/2020 1137 Last data filed at 12/09/2020 0855 Gross per 24 hour  Intake 598 ml  Output 1250 ml  Net -652 ml   Filed Weights   12/02/20 2201 12/04/20 0530 12/08/20 0439  Weight: 85 kg 89.2 kg 88 kg       Physical Exam:   General:  Alert, oriented, cooperative, no distress;   HEENT:  Normocephalic, PERRL, otherwise with in Normal limits   Neuro:  CNII-XII intact. , normal motor and sensation, reflexes intact   Lungs:   Clear to auscultation BL, Respirations unlabored, no wheezes / crackles  Cardio:    S1/S2, RRR, No murmure, No Rubs or Gallops   Abdomen:   Soft, non-tender, bowel sounds active all four quadrants,  no guarding or peritoneal signs.  Muscular skeletal:  Limited exam - in bed, able to move all 4 extremities,  Normal strength,  2+ pulses,  symmetric, No pitting edema  Skin:  Dry, warm to touch, negative for any Rashes,  Wounds: Please see nursing documentation           Data Reviewed:   I have personally reviewed following labs and imaging studies:  Labs: Labs show the following:   Basic Metabolic Panel: Recent Labs  Lab 12/05/20 0518 12/06/20 0212 12/07/20 0018 12/08/20 0020 12/09/20 0028  NA 134* 136 139 139 139  K 3.3* 3.8 3.1* 3.4* 3.2*  CL 103 105 104 100 100  CO2 21* 23 28 29 30   GLUCOSE 87 90 111* 107* 117*  BUN 18 14 7* 5* 10  CREATININE 0.89 0.75 0.64 0.66 0.74  CALCIUM 8.3* 8.4* 8.2* 8.4* 8.5*  MG 2.1 1.9 1.7 1.6* 1.8  PHOS 3.1 3.6 4.5 3.7 3.4   GFR Estimated Creatinine Clearance: 72.5 mL/min (by C-G formula based on SCr of 0.74 mg/dL). Liver Function Tests: Recent Labs  Lab 12/05/20 0518 12/06/20 0212 12/07/20 0018 12/08/20 0020 12/09/20 0028  AST 207* 206* 162* 94* 58*  ALT 109* 104* 98* 78* 61*  ALKPHOS 187* 267* 379* 356* 312*  BILITOT 1.7* 1.5* 1.0 1.3* 1.0  PROT 5.7* 5.6* 5.9* 6.3* 6.5  ALBUMIN 2.1* 2.0* 2.2* 2.4* 2.5*   No results for input(s): LIPASE, AMYLASE in the last 168 hours. Recent Labs  Lab 12/03/20 0123 12/03/20 0515  AMMONIA 15 27   Coagulation profile Recent Labs  Lab 12/03/20 0515 12/04/20 0508  INR 1.5* 1.6*    CBC: Recent Labs  Lab 12/02/20 2054 12/03/20 0055 12/05/20 0518 12/06/20 0212 12/07/20 0018 12/08/20 0020 12/09/20 0028  WBC 7.1   < > 6.6 6.8 6.4 6.2 7.6  NEUTROABS 6.2  --   --   --   --   --   --   HGB 12.0   < > 9.6* 9.4* 9.6* 11.2* 10.3*  HCT 36.3   < > 28.8* 28.0* 28.6* 32.5* 31.6*  MCV 84.6   < > 84.0 84.8 84.1 82.5 85.2  PLT 181   < > 157 186 224 256 340   < > = values in this interval not displayed.   Cardiac Enzymes: Recent Labs  Lab 12/03/20 0123  CKTOTAL 1,075*   BNP (last 3 results) No results for input(s): PROBNP in the last 8760 hours. CBG: Recent Labs  Lab 12/03/20 1515  12/03/20 1957 12/03/20 2322 12/04/20 0313 12/04/20 2139  GLUCAP 101* 100* 77 81 85   D-Dimer: No results for input(s): DDIMER in the last 72 hours. Hgb A1c: No results for input(s): HGBA1C in the last 72 hours. Lipid Profile: No results for input(s): CHOL, HDL, LDLCALC, TRIG, CHOLHDL, LDLDIRECT in the last 72 hours.  Thyroid function studies: No results for input(s): TSH, T4TOTAL, T3FREE, THYROIDAB in the last 72 hours.  Invalid input(s): FREET3 Anemia work up: No results for input(s): VITAMINB12, FOLATE, FERRITIN, TIBC, IRON, RETICCTPCT in the last 72 hours. Sepsis Labs: Recent Labs  Lab 12/02/20 2225 12/03/20 0126 12/03/20 0515 12/04/20 0508 12/06/20 0212 12/07/20 0018 12/08/20 0020 12/09/20 0028  PROCALCITON  --   --  9.18  --   --   --   --   --   WBC  --   --  5.1   < > 6.8 6.4 6.2 7.6  LATICACIDVEN 1.7 2.2* 1.4  --   --   --   --   --    < > = values in this interval not displayed.    Microbiology Recent Results (from the past 240 hour(s))  Blood Culture (routine x 2)     Status: None   Collection Time: 12/02/20  8:50 PM   Specimen: BLOOD LEFT HAND  Result Value Ref Range Status   Specimen Description BLOOD LEFT HAND  Final   Special Requests   Final    BOTTLES DRAWN AEROBIC AND ANAEROBIC Blood Culture adequate volume   Culture   Final    NO GROWTH 5 DAYS Performed at [facility], [address], [place], [place] [address]    Report Status 12/07/2020 FINAL  Final  Blood Culture (routine x 2)     Status: None   Collection Time: 12/02/20  9:15 PM   Specimen: BLOOD RIGHT WRIST  Result Value Ref Range Status   Specimen Description BLOOD RIGHT WRIST  Final   Special Requests   Final    BOTTLES DRAWN AEROBIC AND ANAEROBIC Blood Culture adequate volume   Culture   Final    NO GROWTH 5 DAYS Performed at [facility], [address] [place], [place] [address]    Report Status 12/07/2020 FINAL  Final  Resp Panel by RT-PCR (Flu A&B, Covid)  Nasopharyngeal Swab     Status: None   Collection Time: 12/02/20 11:09 PM   Specimen: Nasopharyngeal Swab; Nasopharyngeal(NP) swabs in vial transport medium  Result Value Ref Range Status   SARS Coronavirus 2 by RT PCR NEGATIVE NEGATIVE Final    Comment: (NOTE) SARS-CoV-2 target nucleic acids are NOT DETECTED.  The SARS-CoV-2 RNA is generally detectable in upper respiratory specimens during the acute phase of infection. The lowest concentration of SARS-CoV-2 viral copies this assay can detect is 138 copies/mL. A negative result does not preclude SARS-Cov-2 infection and should not be used as the sole basis for treatment or other patient management decisions. A negative result may occur with  improper specimen collection/handling, submission of specimen other than nasopharyngeal swab, presence of  viral mutation(s) within the areas targeted by this assay, and inadequate number of viral copies(<138 copies/mL). A negative result must be combined with clinical observations, patient history, and epidemiological information. The expected result is Negative.  Fact Sheet for Patients:  BloggerCourse.com  Fact Sheet for Healthcare Providers:  SeriousBroker.it  This test is no t yet approved or cleared by the [place] FDA and  has been authorized for detection and/or diagnosis of SARS-CoV-2 by FDA under an Emergency Use Authorization (EUA). This EUA will remain  in effect (meaning this test can be used) for the duration of the COVID-19 declaration under Section 564(b)(1) of the Act, 21 U.S.C.section 360bbb-3(b)(1), unless the authorization is terminated  or revoked sooner.       Influenza A by PCR NEGATIVE NEGATIVE Final   Influenza B by PCR NEGATIVE NEGATIVE Final    Comment: (NOTE) The Xpert Xpress SARS-CoV-2/FLU/RSV plus assay is intended as an aid in the diagnosis of influenza from Nasopharyngeal swab specimens and should not be  used as a sole basis for treatment. Nasal washings and aspirates are unacceptable for Xpert Xpress SARS-CoV-2/FLU/RSV testing.  Fact Sheet for Patients: BloggerCourse.com  Fact Sheet for Healthcare Providers: SeriousBroker.it  This test is not yet approved or cleared by the [place] FDA and has been authorized for detection and/or diagnosis of SARS-CoV-2 by FDA under an Emergency Use Authorization (EUA). This EUA will remain in effect (meaning this test can be used) for the duration of the COVID-19 declaration under Section 564(b)(1) of the Act, 21 U.S.C. section 360bbb-3(b)(1), unless the authorization is terminated or revoked.  Performed at [facility], [address], [place], [place] [address]   Urine culture     Status: None   Collection Time: 12/03/20 12:17 AM   Specimen: In/Out Cath Urine  Result Value Ref Range Status   Specimen Description IN/OUT CATH URINE  Final   Special Requests NONE  Final   Culture   Final    NO GROWTH Performed at [facility], [address] [place], [place] [address]    Report Status 12/04/2020 FINAL  Final  MRSA Next Gen by PCR, Nasal     Status: None   Collection Time: 12/03/20  2:18 AM  Result Value Ref Range Status   MRSA by PCR Next Gen NOT DETECTED NOT DETECTED Final    Comment: (NOTE) The GeneXpert MRSA Assay (FDA approved for NASAL specimens only), is one component of a comprehensive MRSA colonization surveillance program. It is not intended to diagnose MRSA infection nor to guide or monitor treatment for MRSA infections. Test performance is not FDA approved in patients less than 32 years old. Performed at [facility], [address], [place], [place] [address]     Procedures and diagnostic studies:  No results found.   LOS: 6 days   Jeannie Mallinger A Moishy Laday  [facility] Chartered loss adjuster on [email or web address]. If 7PM-7AM, please contact night-coverage at  [email or web address]     12/09/2020, 11:37 AM

## 2020-12-09 NOTE — Progress Notes (Signed)
Occupational Therapy Treatment Patient Details Name: Angela Ramirez MRN: [id number] DOB: [birth year] Today's Date: 12/09/2020    History of present illness     OT comments  Pt. Seen for skilled OT session.  Able to complete bed mobility, lb dressing, and stand pivot to recliner.  Mostly mod a with increased time for task initiation. Motivated and pleased to be up in recliner at end of session. Will continue with ADLs next session with introduction of A/E for LB B/D tasks.     Follow Up Recommendations  SNF;Supervision/Assistance - 24 hour    Equipment Recommendations       Recommendations for Other Services      Precautions / Restrictions Precautions Precautions: Fall       Mobility Bed Mobility Overal bed mobility: Needs Assistance Bed Mobility: Supine to Sit Rolling: Min guard   Supine to sit: Min guard;HOB elevated     General bed mobility comments: Incr time and effort and use of rail but no physical assistance required    Transfers Overall transfer level: Needs assistance Equipment used: None Transfers: Stand Pivot Transfers   Stand pivot transfers: Mod assist            Balance                                           ADL either performed or assessed with clinical judgement   ADL Overall ADL's : Needs assistance/impaired   Eating/Feeding Details (indicate cue type and reason): reviewed scanning as compensatory strategy duirng eating and obtaining items on tray                 Lower Body Dressing: Moderate assistance;Sitting/lateral leans Lower Body Dressing Details (indicate cue type and reason): pt. requested to wear sneakers vs. slip socks.  utilized b hands to untie and losen laces but required mod a to don and get heel in the shoe, Toilet Transfer: Tax adviser Details (indicate cue type and reason): simulated with pivot from eob to recliner.         Functional mobility during ADLs: Min guard General  ADL Comments: pt. agreeable to utilize bsc vs. purewik.  reviewed same transfer as eob to recliner     Vision       Perception     Praxis      Cognition Arousal/Alertness: Awake/alert Behavior During Therapy: WFL for tasks assessed/performed Overall Cognitive Status: Within Functional Limits for tasks assessed                                          Exercises     Shoulder Instructions       General Comments      Pertinent Vitals/ Pain       Pain Assessment: No/denies pain  Home Living                                          Prior Functioning/Environment              Frequency  Min 2X/week        Progress Toward Goals  OT Goals(current goals can now be found in the care plan section)  Progress towards OT goals: Progressing toward goals     Plan Discharge plan remains appropriate    Co-evaluation                 AM-PAC OT "6 Clicks" Daily Activity     Outcome Measure   Help from another person eating meals?: A Little Help from another person taking care of personal grooming?: A Little Help from another person toileting, which includes using toliet, bedpan, or urinal?: A Lot Help from another person bathing (including washing, rinsing, drying)?: A Lot Help from another person to put on and taking off regular upper body clothing?: A Lot Help from another person to put on and taking off regular lower body clothing?: A Lot 6 Click Score: 14    End of Session Equipment Utilized During Treatment: Gait belt  OT Visit Diagnosis: Unsteadiness on feet (R26.81);Other abnormalities of gait and mobility (R26.89);Muscle weakness (generalized) (M62.81);Low vision, both eyes (H54.2);Dizziness and giddiness (R42)   Activity Tolerance Patient tolerated treatment well   Patient Left in chair;with call bell/phone within reach;with nursing/sitter in room   Nurse Communication Other (comment) (rn present for session,  aware pt. will use bsc today vs. pure wik.)        Time: 0981-1914 OT Time Calculation (min): 15 min  Charges: OT General Charges $OT Visit: 1 Visit OT Treatments $Self Care/Home Management : 8-22 mins  Boneta Lucks, COTA/L Acute Rehabilitation [phone]    [facility] [place] 12/09/2020, 12:06 PM

## 2020-12-10 LAB — COMPREHENSIVE METABOLIC PANEL
ALT: 44 U/L (ref 0–44)
AST: 37 U/L (ref 15–41)
Albumin: 2.7 g/dL — ABNORMAL LOW (ref 3.5–5.0)
Alkaline Phosphatase: 257 U/L — ABNORMAL HIGH (ref 38–126)
Anion gap: 7 (ref 5–15)
BUN: 8 mg/dL (ref 8–23)
CO2: 26 mmol/L (ref 22–32)
Calcium: 8.8 mg/dL — ABNORMAL LOW (ref 8.9–10.3)
Chloride: 104 mmol/L (ref 98–111)
Creatinine, Ser: 0.72 mg/dL (ref 0.44–1.00)
GFR, Estimated: >60 mL/min (ref >60)
Glucose, Bld: 123 mg/dL — ABNORMAL HIGH (ref 70–99)
Potassium: 3.9 mmol/L (ref 3.5–5.1)
Sodium: 137 mmol/L (ref 135–145)
Total Bilirubin: 0.7 mg/dL (ref 0.3–1.2)
Total Protein: 6.9 g/dL (ref 6.5–8.1)

## 2020-12-10 MED ORDER — CIPROFLOXACIN HCL 500 MG PO TABS: 500.0000 mg | ORAL_TABLET | Freq: Two times a day (BID) | ORAL | 0 refills | Status: DC

## 2020-12-10 MED ORDER — RISAQUAD PO CAPS: 1.0000 | ORAL_CAPSULE | Freq: Three times a day (TID) | ORAL | 0 refills | Status: AC

## 2020-12-10 MED ORDER — METRONIDAZOLE 500 MG PO TABS: 500.0000 mg | ORAL_TABLET | Freq: Three times a day (TID) | ORAL | 0 refills | Status: DC

## 2020-12-10 MED ORDER — POLYETHYLENE GLYCOL 3350 17 G PO PACK: 17.0000 g | PACK | Freq: Every day | ORAL | 0 refills | Status: AC

## 2020-12-10 MED ORDER — ONDANSETRON HCL 4 MG PO TABS: 4.0000 mg | ORAL_TABLET | Freq: Three times a day (TID) | ORAL | 0 refills | Status: AC | PRN

## 2020-12-10 NOTE — Care Management (Signed)
    Durable Medical Equipment  (From admission, onward)           Start     Ordered   12/10/20 1050  For home use only DME Hospital bed  Once       Question Answer Comment  Length of Need 6 Months   Patient has (list medical condition): weakness   The above medical condition requires: Patient requires the ability to reposition frequently   Bed type Semi-electric   Support Surface: Gel Overlay      12/10/20 1050   12/10/20 1050  For home use only DME 3 n 1  Once        12/10/20 1050   12/10/20 1050  For home use only DME 4 wheeled rolling walker with seat  Once       Question:  Patient needs a walker to treat with the following condition  Answer:  Weakness   12/10/20 1050

## 2020-12-10 NOTE — Discharge Summary (Signed)
Physician Discharge Summary [facility]    Patient: Angela Ramirez                   Admit date: 12/02/2020   DOB: 05/30/1946             Discharge date:12/10/2020/11:12 AM [id number]                          PCP: Myrlene Broker, MD  Disposition: HOME with Home Health   Recommendations for Outpatient Follow-up:   Follow up: Follow with a PCP in 2-3 weeks Continue current recommended antibiotics, follow-up with gastroenterology in 6 to 8 weeks Follow-up with ophthalmologist regarding further evaluation recommendation Fall precaution continue home health PT/OT  Discharge Condition: Stable   Code Status:   Code Status: Full Code  Diet recommendation: Cardiac diet   Discharge Diagnoses:    Principal Problem:   Severe sepsis (HCC) Active Problems:   Diverticulitis large intestine   History of Present Illness/ Hospital Course Charline Bills Summary:    Angela Ramirez is a 75 y.o. female with medical history significant for hypertension, hepatitis C, liver cirrhosis, depression, anxiety, diverticulosis, IBS, Arnold-Chiari malformation, recent left eye surgery, recent treatment of UTI with Macrobid, who presented to the hospital with nausea and vomiting.  She was febrile, tachycardic, tachypneic and hypotensive in the emergency room.   She was admitted to the hospital for severe sepsis secondary to acute diverticulitis complicated by acute hypoxic respiratory failure, acute metabolic encephalopathy and acute kidney injury.  She was admitted to the ICU for management.  She was intubated and placed on mechanical ventilation for acute respiratory failure.  She was treated with empiric IV antibiotics and IV fluids.  Her condition improved and she was transferred to [facility] on 12/06/2020.   Severe sepsis secondary to acute diverticulitis:  afebrile normotensive, stable much improved sepsis pathophysiology, hemodynamically stable now, S/p empiric IV  antibiotics Unasyn switched to p.o. Cipro Flagyl today 12/09/2018 Anticipating total of 14 days of antibiotic coverage  Acute hypoxic respiratory failure:  Resolved  Much improved, off oxygen supplement S/p intubated on 12/04/2019 extubated on 12/04/2020.   On room air satting > 90%   AKI: Resolved  Hypokalemia / hypomagnesemia: Repleted with p.o. potassium, IV magnesium, hyponatremia improved   History of cataract, Abormal left eye vision:  MRI brain did not show any acute stroke.  She has abnormal left eye vision at baseline but vision is worsen on this admission.  Case was discussed with ophthalmologist, Dr. [name].  He said evaluation of the left eye will be limited because he does not have all the requested equipment to do a thorough assessment in the hospital.  He recommended outpatient follow-up.  He requested her son's contact information so that he could discuss this with the patient's son. No further changes in visual acuity -Reporting of no progression of visual deficit   Elevated liver enzymes, hepatitis C, liver cirrhosis: Compensated  Generalized weakness: PT and OT evaluation--- significant weakness is noted--recommending SNF (Refused SNF, accepted home health)  Constipation: Ordered laxatives   Hypertension: Resume home amlodipine. D/c HCTZ.   Body mass index is 30.8 kg/m.  (Obesity)     Consultants: Intensivist   Procedures: Intubation and mechanical ventilation    Discharge Instructions:   Discharge Instructions     Activity as tolerated - No restrictions   Complete by: As directed    Call MD for:  difficulty breathing, headache or  visual disturbances   Complete by: As directed    Call MD for:  persistant dizziness or light-headedness   Complete by: As directed    Call MD for:  persistant nausea and vomiting   Complete by: As directed    Call MD for:  redness, tenderness, or signs of infection (pain, swelling, redness, odor or green/yellow  discharge around incision site)   Complete by: As directed    Call MD for:  temperature >100.4   Complete by: As directed    Diet - low sodium heart healthy   Complete by: As directed    Discharge instructions   Complete by: As directed    Follow-up with ophthalmologist as recommended, follow-up with PCP Continue current medication antibiotics. Need referral on future (4-6 weeks) follow-up with gastroenterologist for further evaluation and recommendations   Increase activity slowly   Complete by: As directed         Medication List     STOP taking these medications    Advanced Formula Eye Drops 0.05-0.1-1-1 % Soln Generic drug: Tetrahydroz-Dextran-PEG-Povid   BIOTIN PO   cetirizine 10 MG tablet Commonly known as: ZYRTEC   Cold & Flu Severe Daytime 5-10-200-325 MG Tabs Generic drug: Phenylephrine-DM-GG-APAP   etodolac 200 MG capsule Commonly known as: LODINE   guaifenesin 400 MG Tabs tablet Commonly known as: HUMIBID E   hydrochlorothiazide 25 MG tablet Commonly known as: HYDRODIURIL   meloxicam 15 MG tablet Commonly known as: MOBIC   traMADol 50 MG tablet Commonly known as: ULTRAM   Zinc Lozg       TAKE these medications    Acetaminophen-Caffeine 500-65 MG Tabs Take 1 tablet by mouth every 6 (six) hours as needed (headache). Tension relief   acidophilus Caps capsule Take 1 capsule by mouth 3 (three) times daily for 10 days.   amLODipine 10 MG tablet Commonly known as: NORVASC Take 1 tablet (10 mg total) by mouth daily. What changed:  when to take this reasons to take this   ciprofloxacin 500 MG tablet Commonly known as: CIPRO Take 1 tablet (500 mg total) by mouth 2 (two) times daily for 6 days.   GINGER ROOT PO Take 1 tablet by mouth daily as needed (immune system boost).   ibuprofen 800 MG tablet Commonly known as: ADVIL Take 1 tablet (800 mg total) by mouth 3 (three) times daily. What changed:  when to take this reasons to take this    linaclotide 72 MCG capsule Commonly known as: Linzess TAKE 1 CAPSULE(72 MCG) BY MOUTH DAILY BEFORE BREAKFAST What changed:  how much to take how to take this when to take this reasons to take this additional instructions   metroNIDAZOLE 500 MG tablet Commonly known as: FLAGYL Take 1 tablet (500 mg total) by mouth every 8 (eight) hours for 6 days.   ondansetron 4 MG tablet Commonly known as: Zofran Take 1 tablet (4 mg total) by mouth every 8 (eight) hours as needed for nausea or vomiting.   polyethylene glycol 17 g packet Commonly known as: MIRALAX / GLYCOLAX Take 17 g by mouth daily for 5 days. Start taking on: December 11, 2020        Follow-up Information     [facility] Follow up.   Specialty: Home Health Services Why: For home health services, they will call you in 1-2 days to set up a time to come out to the house. They were provided with Tamara's number. Contact information: [address]  [place] [address] [phone]                Allergies  Allergen Reactions   Crab (Diagnostic) Hives   Morphine And Related Nausea And Vomiting   Sulfa Antibiotics Nausea And Vomiting     Procedures /Studies:   CT HEAD WO CONTRAST  Result Date: 12/04/2020 CLINICAL DATA:  Altered mental status with loss of vision left eye EXAM: CT HEAD WITHOUT CONTRAST TECHNIQUE: Contiguous axial images were obtained from the base of the skull through the vertex without intravenous contrast. COMPARISON:  December 03, 2020 FINDINGS: Brain: Ventricles and sulci are normal in size and configuration. There is no intracranial mass, hemorrhage, extra-axial fluid collection, or midline shift. There is slight decreased attenuation in areas of the right centrum semiovale anteriorly and posteriorly. There is subtle decreased attenuation in the head of the right caudate nucleus and adjacent anterior limb of the right internal capsule. There is evidence of a prior small infarct  in the anterior right thalamus. Vascular: No hyperdense vessel. Calcification noted in each carotid siphon region. Skull: Bony calvarium appears intact Sinuses/Orbits: Visualized paranasal sinuses are clear. Orbits appear symmetric bilaterally. Other: Mastoid air cells clear. IMPRESSION: Mild periventricular small vessel disease. 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 Subtle decreased attenuation in portions of the head of the caudate nucleus on the right and anterior limb right internal capsule, not convincingly seen 1 day prior. Earliest changes of small infarct in this area cannot be excluded. No mass or hemorrhage. Foci of arterial vascular calcification evident. These results will be called to the ordering clinician or representative by the Radiologist Assistant, and communication documented in the PACS or [employer]. Electronically Signed   By: Bretta Bang III M.D.   On: 12/04/2020 14:33   CT HEAD WO CONTRAST  Result Date: 12/03/2020 CLINICAL DATA:  Altered mental status EXAM: CT HEAD WITHOUT CONTRAST TECHNIQUE: Contiguous axial images were obtained from the base of the skull through the vertex without intravenous contrast. COMPARISON:  None. FINDINGS: Brain: No acute intracranial abnormality. Specifically, no hemorrhage, hydrocephalus, mass lesion, acute infarction, or significant intracranial injury. Vascular: No hyperdense vessel or unexpected calcification. Skull: No acute calvarial abnormality. Sinuses/Orbits: No acute findings Other: None IMPRESSION: No acute intracranial abnormality. Electronically Signed   By: Charlett Nose M.D.   On: 12/03/2020 03:25   CT Chest W Contrast  Addendum Date: 12/03/2020   ADDENDUM REPORT: 12/03/2020 03:48 ADDENDUM: Additional impression point: Stable calcified lymph nodes present in the porta hepatis. Results and critical findings were called by telephone at the time of interpretation on 12/03/2020 at 3:48 am to provider Dr. Judd Lien, Who verbally  acknowledged these results. Electronically Signed   By: Kreg Shropshire M.D.   On: 12/03/2020 03:48   Result Date: 12/03/2020 CLINICAL DATA:  Abnormal chest radiograph. Concern for pneumonia. Abdominal pain. Altered mental status. EXAM: CT CHEST, ABDOMEN, AND PELVIS WITH CONTRAST TECHNIQUE: Multidetector CT imaging of the chest, abdomen and pelvis was performed following the standard protocol during bolus administration of intravenous contrast. CONTRAST:  OMNIPAQUE IOHEXOL 300 MG/ML  SOLN COMPARISON:  Radiograph 12/03/2020, CT abdomen and pelvis 10/07/2019 FINDINGS: CT CHEST FINDINGS Cardiovascular: Cardiac size within normal limits. Few scattered coronary artery calcifications. Calcifications of the mitral annulus and aortic leaflets. No sizable pericardial effusion. Atherosclerotic plaque within the normal caliber aorta. No acute luminal abnormality of the imaged aorta. No periaortic stranding or hemorrhage. Normal 3 vessel branching of the aortic arch. Proximal great vessels are unremarkable.  Central pulmonary arteries are normal caliber. No large central filling defects within limitations of this non tailored examination of the pulmonary arteries. No major venous abnormalities are seen. Mediastinum/Nodes: Endotracheal intubation with the tip of the endotracheal tube low in the trachea, approximately 8 mm from the carina. Recommend retraction 4 cm to position in the mid trachea. Transesophageal tube in place with tip in the gastric lumen and side port distal to the GE junction. No acute abnormality of the trachea or esophagus. No concerning thyroid nodules or masses. Thoracic inlet is otherwise unremarkable. Small amount of fluid in the pericardial recesses within normal limits. No free mediastinal fluid or gas. No concerning mediastinal, hilar or axillary adenopathy is seen. Lungs/Pleura: Streaky bandlike opacities in dependent and basilar portions of the lungs likely reflecting atelectatic change. Trace  left pleural effusion. No right effusion. No pneumothorax. Some mild mosaic attenuation of the lungs is nonspecific, could reflect underlying air trapping or small airways disease or accentuation by imaging during exhalation. Mild centrilobular emphysematous change. No concerning pulmonary nodules or masses. Musculoskeletal: Degenerative changes are present in the imaged spine and shoulders. CT ABDOMEN PELVIS FINDINGS Hepatobiliary: Slightly lobular hepatic surface contour with hypertrophy of the caudate left lobe liver compatible with patient history of cirrhosis. No discernible focal liver lesion is seen. Mild heterogeneity of the hepatic enhancement is fairly typical of cirrhotic change. Normal gallbladder and biliary tree without visible calcified gallstones. Pancreas: No pancreatic ductal dilatation or surrounding inflammatory changes. Spleen: Normal in size. No concerning splenic lesions. Adrenals/Urinary Tract: Normal adrenals. Kidneys are normally located with symmetric enhancementand excretion. Few tiny subcentimeter hypoattenuating foci in both kidneys, too small to fully characterize on CT imaging but statistically likely benign. No suspicious renal lesion, urolithiasis or hydronephrosis. Urinary bladder is free of acute abnormality. Stomach/Bowel: Transesophageal tube, as above stomach and duodenum are unremarkable. Normal duodenal sweep across the midline abdomen. No small bowel thickening or dilatation normal appendix in the right lower quadrant coursing in a retrocecal fashion. No periappendiceal inflammation to suggest acute appendicitis. Extensive distal colonic diverticulosis question some very faint Peridiverticular inflammation of the proximal sigmoid (3/102) could reflect an early or developing diverticulitis in the appropriate clinical setting. No extraluminal gas, organized collection or abscess formation. Vascular/Lymphatic: Atherosclerotic calcifications within the abdominal aorta and branch  vessels. No aneurysm or ectasia. No enlarged abdominopelvic lymph nodes. Reproductive: Anteverted uterus.  No concerning adnexal lesions. Other: Soft tissue gas along the right low anterior abdominal wall likely related to injectable use. No abdominopelvic free air or fluid. No bowel containing hernia. Mild ventral rectus diastasis. Musculoskeletal: Multilevel degenerative changes are present in the imaged portions of the spine. Grade 1 anterolisthesis L5 on S1 in maximal degenerative changes at this level. Multilevel Schmorl's node formations. Additional mild-to-moderate degenerative changes in the hips and pelvis. No acute osseous abnormality or suspicious osseous lesion. IMPRESSION: 1. Some dependent areas of bandlike opacity favoring atelectatic change with trace left pleural effusion. No other consolidative process or CT evidence of edema. 2. Extensive distal colonic diverticulosis with a subtle focus of peridiverticular inflammation in the proximal sigmoid, could reflect early or developing diverticulitis in the appropriate clinical setting. No extraluminal gas, organized collection or abscess. 3. Nonspecific mosaic attenuation in the lungs, could reflect underlying air trapping or small airways disease. 4. Stigmata of hepatic cirrhosis.  No concerning focal liver lesion. 5. Appropriate positioning of the transesophageal tube. 6. Low positioning of the endotracheal tube, 8 cm from the carina. Recommend retraction 4 cm to the  mid trachea. 7. Aortic Atherosclerosis (ICD10-I70.0). 8. Emphysema (ICD10-J43.9). Currently attempting to contact the ordering provider with a critical value result. Addendum will be submitted upon case discussion. Electronically Signed: By: Kreg Shropshire M.D. On: 12/03/2020 03:36   MR BRAIN WO CONTRAST  Result Date: 12/06/2020 CLINICAL DATA:  History of nausea and vomiting. Altered mental status. Left eye vision disturbance. EXAM: MRI HEAD WITHOUT CONTRAST TECHNIQUE: Multiplanar,  multiecho pulse sequences of the brain and surrounding structures were obtained without intravenous contrast. COMPARISON:  Head CT 2 days ago. FINDINGS: The study suffers from moderate motion degradation. Brain: Diffusion imaging does not show any acute or subacute infarction. The brainstem and cerebellum are normal. Cerebral hemispheres are normal for age. No sign of small vessel change. No cortical or large vessel territory infarction. Mass lesion, hemorrhage, hydrocephalus or extra-axial collection. Vascular: Major vessels at the base of the brain show flow. Skull and upper cervical spine: Negative Sinuses/Orbits: Clear/normal Other: None IMPRESSION: Motion degraded study. No acute abnormality suspected. No abnormality seen to explain the presenting symptoms. Brain appears normal for age. Electronically Signed   By: Paulina Fusi M.D.   On: 12/06/2020 12:49   CT Abdomen Pelvis W Contrast  Addendum Date: 12/03/2020   ADDENDUM REPORT: 12/03/2020 03:48 ADDENDUM: Additional impression point: Stable calcified lymph nodes present in the porta hepatis. Results and critical findings were called by telephone at the time of interpretation on 12/03/2020 at 3:48 am to provider Dr. Judd Lien, Who verbally acknowledged these results. Electronically Signed   By: Kreg Shropshire M.D.   On: 12/03/2020 03:48   Result Date: 12/03/2020 CLINICAL DATA:  Abnormal chest radiograph. Concern for pneumonia. Abdominal pain. Altered mental status. EXAM: CT CHEST, ABDOMEN, AND PELVIS WITH CONTRAST TECHNIQUE: Multidetector CT imaging of the chest, abdomen and pelvis was performed following the standard protocol during bolus administration of intravenous contrast. CONTRAST:  OMNIPAQUE IOHEXOL 300 MG/ML  SOLN COMPARISON:  Radiograph 12/03/2020, CT abdomen and pelvis 10/07/2019 FINDINGS: CT CHEST FINDINGS Cardiovascular: Cardiac size within normal limits. Few scattered coronary artery calcifications. Calcifications of the mitral annulus and  aortic leaflets. No sizable pericardial effusion. Atherosclerotic plaque within the normal caliber aorta. No acute luminal abnormality of the imaged aorta. No periaortic stranding or hemorrhage. Normal 3 vessel branching of the aortic arch. Proximal great vessels are unremarkable. Central pulmonary arteries are normal caliber. No large central filling defects within limitations of this non tailored examination of the pulmonary arteries. No major venous abnormalities are seen. Mediastinum/Nodes: Endotracheal intubation with the tip of the endotracheal tube low in the trachea, approximately 8 mm from the carina. Recommend retraction 4 cm to position in the mid trachea. Transesophageal tube in place with tip in the gastric lumen and side port distal to the GE junction. No acute abnormality of the trachea or esophagus. No concerning thyroid nodules or masses. Thoracic inlet is otherwise unremarkable. Small amount of fluid in the pericardial recesses within normal limits. No free mediastinal fluid or gas. No concerning mediastinal, hilar or axillary adenopathy is seen. Lungs/Pleura: Streaky bandlike opacities in dependent and basilar portions of the lungs likely reflecting atelectatic change. Trace left pleural effusion. No right effusion. No pneumothorax. Some mild mosaic attenuation of the lungs is nonspecific, could reflect underlying air trapping or small airways disease or accentuation by imaging during exhalation. Mild centrilobular emphysematous change. No concerning pulmonary nodules or masses. Musculoskeletal: Degenerative changes are present in the imaged spine and shoulders. CT ABDOMEN PELVIS FINDINGS Hepatobiliary: Slightly lobular hepatic surface contour with  hypertrophy of the caudate left lobe liver compatible with patient history of cirrhosis. No discernible focal liver lesion is seen. Mild heterogeneity of the hepatic enhancement is fairly typical of cirrhotic change. Normal gallbladder and biliary tree  without visible calcified gallstones. Pancreas: No pancreatic ductal dilatation or surrounding inflammatory changes. Spleen: Normal in size. No concerning splenic lesions. Adrenals/Urinary Tract: Normal adrenals. Kidneys are normally located with symmetric enhancementand excretion. Few tiny subcentimeter hypoattenuating foci in both kidneys, too small to fully characterize on CT imaging but statistically likely benign. No suspicious renal lesion, urolithiasis or hydronephrosis. Urinary bladder is free of acute abnormality. Stomach/Bowel: Transesophageal tube, as above stomach and duodenum are unremarkable. Normal duodenal sweep across the midline abdomen. No small bowel thickening or dilatation normal appendix in the right lower quadrant coursing in a retrocecal fashion. No periappendiceal inflammation to suggest acute appendicitis. Extensive distal colonic diverticulosis question some very faint Peridiverticular inflammation of the proximal sigmoid (3/102) could reflect an early or developing diverticulitis in the appropriate clinical setting. No extraluminal gas, organized collection or abscess formation. Vascular/Lymphatic: Atherosclerotic calcifications within the abdominal aorta and branch vessels. No aneurysm or ectasia. No enlarged abdominopelvic lymph nodes. Reproductive: Anteverted uterus.  No concerning adnexal lesions. Other: Soft tissue gas along the right low anterior abdominal wall likely related to injectable use. No abdominopelvic free air or fluid. No bowel containing hernia. Mild ventral rectus diastasis. Musculoskeletal: Multilevel degenerative changes are present in the imaged portions of the spine. Grade 1 anterolisthesis L5 on S1 in maximal degenerative changes at this level. Multilevel Schmorl's node formations. Additional mild-to-moderate degenerative changes in the hips and pelvis. No acute osseous abnormality or suspicious osseous lesion. IMPRESSION: 1. Some dependent areas of bandlike  opacity favoring atelectatic change with trace left pleural effusion. No other consolidative process or CT evidence of edema. 2. Extensive distal colonic diverticulosis with a subtle focus of peridiverticular inflammation in the proximal sigmoid, could reflect early or developing diverticulitis in the appropriate clinical setting. No extraluminal gas, organized collection or abscess. 3. Nonspecific mosaic attenuation in the lungs, could reflect underlying air trapping or small airways disease. 4. Stigmata of hepatic cirrhosis.  No concerning focal liver lesion. 5. Appropriate positioning of the transesophageal tube. 6. Low positioning of the endotracheal tube, 8 cm from the carina. Recommend retraction 4 cm to the mid trachea. 7. Aortic Atherosclerosis (ICD10-I70.0). 8. Emphysema (ICD10-J43.9). Currently attempting to contact the ordering provider with a critical value result. Addendum will be submitted upon case discussion. Electronically Signed: By: Kreg Shropshire M.D. On: 12/03/2020 03:36   DG CHEST PORT 1 VIEW  Result Date: 12/03/2020 CLINICAL DATA:  76 year old female with intubation EXAM: PORTABLE CHEST 1 VIEW COMPARISON:  12/03/2020, CT chest 12/03/2020 FINDINGS: Cardiomediastinal silhouette unchanged in size and contour. No evidence of new interlobular septal thickening. Endotracheal tube terminates suitably above the carina, unchanged, 3.5 cm. Gastric tube traverses the mediastinum and terminates out of the field of view, unchanged. Blunting at the left costophrenic angle with partial obscuration of the left hemidiaphragm and the left heart border. Coarsened interstitial markings. IMPRESSION: Unchanged and suitable E positioned endotracheal tube and gastric tube. Opacity at the left lung base, likely a combination of small pleural fluid with associated atelectasis/consolidation. Electronically Signed   By: Gilmer Mor D.O.   On: 12/03/2020 09:36   DG Chest Portable 1 View  Result Date:  12/03/2020 CLINICAL DATA:  Post intubation EXAM: PORTABLE CHEST 1 VIEW COMPARISON:  12/02/2020 FINDINGS: Endotracheal tube is 2 cm above the carina.  NG tube is in the stomach. Heart is normal size. Left base atelectasis or infiltrate. Right lung clear. No effusions or acute bony abnormality. IMPRESSION: Support devices in expected position as above. Left base atelectasis or infiltrate. Electronically Signed   By: Charlett Nose M.D.   On: 12/03/2020 02:27   DG Chest Port 1 View  Result Date: 12/02/2020 CLINICAL DATA:  Questionable sepsis EXAM: PORTABLE CHEST 1 VIEW COMPARISON:  None. FINDINGS: Heart and mediastinal contours are within normal limits. No focal opacities or effusions. No acute bony abnormality. IMPRESSION: No active disease. Electronically Signed   By: Charlett Nose M.D.   On: 12/02/2020 22:51    Subjective:   Patient was seen and examined 12/10/2020, 11:12 AM Patient stable today. No acute distress.  No issues overnight Stable for discharge.  Discharge Exam:    Vitals:   12/10/20 0518 12/10/20 0530 12/10/20 0634 12/10/20 0727  BP: 128/66 138/67 129/89 (!) 141/71  Pulse: 87 83 83 84  Resp: 16   12  Temp: 98.4 F (36.9 C) 98.7 F (37.1 C)  98.3 F (36.8 C)  TempSrc: Oral Oral  Oral  SpO2: 97% 95% 98% 100%  Weight:      Height:        General: Pt lying comfortably in bed & appears in no obvious distress. Cardiovascular: S1 & S2 heard, RRR, S1/S2 +. No murmurs, rubs, gallops or clicks. No JVD or pedal edema. Respiratory: Clear to auscultation without wheezing, rhonchi or crackles. No increased work of breathing. Abdominal:  Non-distended, non-tender & soft. No organomegaly or masses appreciated. Normal bowel sounds heard. CNS: Alert and oriented. No focal deficits. Extremities: no edema, no cyanosis      The results of significant diagnostics from this hospitalization (including imaging, microbiology, ancillary and laboratory) are listed below for reference.       Microbiology:   Recent Results (from the past 240 hour(s))  Blood Culture (routine x 2)     Status: None   Collection Time: 12/02/20  8:50 PM   Specimen: BLOOD LEFT HAND  Result Value Ref Range Status   Specimen Description BLOOD LEFT HAND  Final   Special Requests   Final    BOTTLES DRAWN AEROBIC AND ANAEROBIC Blood Culture adequate volume   Culture   Final    NO GROWTH 5 DAYS Performed at [facility], [address], [place], [place] [address]    Report Status 12/07/2020 FINAL  Final  Blood Culture (routine x 2)     Status: None   Collection Time: 12/02/20  9:15 PM   Specimen: BLOOD RIGHT WRIST  Result Value Ref Range Status   Specimen Description BLOOD RIGHT WRIST  Final   Special Requests   Final    BOTTLES DRAWN AEROBIC AND ANAEROBIC Blood Culture adequate volume   Culture   Final    NO GROWTH 5 DAYS Performed at [facility], [address] [place], [place] [address]    Report Status 12/07/2020 FINAL  Final  Resp Panel by RT-PCR (Flu A&B, Covid) Nasopharyngeal Swab     Status: None   Collection Time: 12/02/20 11:09 PM   Specimen: Nasopharyngeal Swab; Nasopharyngeal(NP) swabs in vial transport medium  Result Value Ref Range Status   SARS Coronavirus 2 by RT PCR NEGATIVE NEGATIVE Final    Comment: (NOTE) SARS-CoV-2 target nucleic acids are NOT DETECTED.  The SARS-CoV-2 RNA is generally detectable in upper respiratory specimens during the acute phase of infection. The lowest  concentration of SARS-CoV-2 viral copies this assay can detect is 138 copies/mL. A negative result does not preclude SARS-Cov-2 infection and should not be used as the sole basis for treatment or other patient management decisions. A negative result may occur with  improper specimen collection/handling, submission of specimen other than nasopharyngeal swab, presence of viral mutation(s) within the areas targeted by this assay, and inadequate number of viral copies(<138  copies/mL). A negative result must be combined with clinical observations, patient history, and epidemiological information. The expected result is Negative.  Fact Sheet for Patients:  BloggerCourse.comhttps://www.fda.gov/media/152166/download  Fact Sheet for Healthcare Providers:  SeriousBroker.ithttps://www.fda.gov/media/152162/download  This test is no t yet approved or cleared by the [place] FDA and  has been authorized for detection and/or diagnosis of SARS-CoV-2 by FDA under an Emergency Use Authorization (EUA). This EUA will remain  in effect (meaning this test can be used) for the duration of the COVID-19 declaration under Section 564(b)(1) of the Act, 21 U.S.C.section 360bbb-3(b)(1), unless the authorization is terminated  or revoked sooner.       Influenza A by PCR NEGATIVE NEGATIVE Final   Influenza B by PCR NEGATIVE NEGATIVE Final    Comment: (NOTE) The Xpert Xpress SARS-CoV-2/FLU/RSV plus assay is intended as an aid in the diagnosis of influenza from Nasopharyngeal swab specimens and should not be used as a sole basis for treatment. Nasal washings and aspirates are unacceptable for Xpert Xpress SARS-CoV-2/FLU/RSV testing.  Fact Sheet for Patients: BloggerCourse.comhttps://www.fda.gov/media/152166/download  Fact Sheet for Healthcare Providers: SeriousBroker.ithttps://www.fda.gov/media/152162/download  This test is not yet approved or cleared by the [place] FDA and has been authorized for detection and/or diagnosis of SARS-CoV-2 by FDA under an Emergency Use Authorization (EUA). This EUA will remain in effect (meaning this test can be used) for the duration of the COVID-19 declaration under Section 564(b)(1) of the Act, 21 U.S.C. section 360bbb-3(b)(1), unless the authorization is terminated or revoked.  Performed at [facility], [address] [place], [place] [address]   Urine culture     Status: None   Collection Time: 12/03/20 12:17 AM   Specimen: In/Out Cath Urine  Result Value Ref Range Status    Specimen Description IN/OUT CATH URINE  Final   Special Requests NONE  Final   Culture   Final    NO GROWTH Performed at [facility], [address] [place], [place] [address]    Report Status 12/04/2020 FINAL  Final  MRSA Next Gen by PCR, Nasal     Status: None   Collection Time: 12/03/20  2:18 AM  Result Value Ref Range Status   MRSA by PCR Next Gen NOT DETECTED NOT DETECTED Final    Comment: (NOTE) The GeneXpert MRSA Assay (FDA approved for NASAL specimens only), is one component of a comprehensive MRSA colonization surveillance program. It is not intended to diagnose MRSA infection nor to guide or monitor treatment for MRSA infections. Test performance is not FDA approved in patients less than 73 years old. Performed at [facility], [address] [place], [place] [address]      Labs:   CBC: Recent Labs  Lab 12/05/20 0518 12/06/20 0212 12/07/20 0018 12/08/20 0020 12/09/20 0028  WBC 6.6 6.8 6.4 6.2 7.6  HGB 9.6* 9.4* 9.6* 11.2* 10.3*  HCT 28.8* 28.0* 28.6* 32.5* 31.6*  MCV 84.0 84.8 84.1 82.5 85.2  PLT 157 186 224 256 340   Basic Metabolic Panel: Recent Labs  Lab 12/05/20 0518 12/06/20 0212 12/07/20  0018 12/08/20 0020 12/09/20 0028 12/10/20 0751  NA 134* 136 139 139 139 137  K 3.3* 3.8 3.1* 3.4* 3.2* 3.9  CL 103 105 104 100 100 104  CO2 21* 23 28 29 30 26   GLUCOSE 87 90 111* 107* 117* 123*  BUN 18 14 7* 5* 10 8  CREATININE 0.89 0.75 0.64 0.66 0.74 0.72  CALCIUM 8.3* 8.4* 8.2* 8.4* 8.5* 8.8*  MG 2.1 1.9 1.7 1.6* 1.8  --   PHOS 3.1 3.6 4.5 3.7 3.4  --    Liver Function Tests: Recent Labs  Lab 12/06/20 0212 12/07/20 0018 12/08/20 0020 12/09/20 0028 12/10/20 0751  AST 206* 162* 94* 58* 37  ALT 104* 98* 78* 61* 44  ALKPHOS 267* 379* 356* 312* 257*  BILITOT 1.5* 1.0 1.3* 1.0 0.7  PROT 5.6* 5.9* 6.3* 6.5 6.9  ALBUMIN 2.0* 2.2* 2.4* 2.5* 2.7*   BNP (last 3 results) No results for input(s): BNP in the last 8760 hours. Cardiac  Enzymes: No results for input(s): CKTOTAL, CKMB, CKMBINDEX, TROPONINI in the last 168 hours. CBG: Recent Labs  Lab 12/03/20 1957 12/03/20 2322 12/04/20 0313 12/04/20 2139 12/09/20 2040  GLUCAP 100* 77 81 85 133*   Hgb A1c No results for input(s): HGBA1C in the last 72 hours. Lipid Profile No results for input(s): CHOL, HDL, LDLCALC, TRIG, CHOLHDL, LDLDIRECT in the last 72 hours. Thyroid function studies No results for input(s): TSH, T4TOTAL, T3FREE, THYROIDAB in the last 72 hours.  Invalid input(s): FREET3 Anemia work up No results for input(s): VITAMINB12, FOLATE, FERRITIN, TIBC, IRON, RETICCTPCT in the last 72 hours. Urinalysis    Component Value Date/Time   COLORURINE YELLOW 12/07/2020 2059   APPEARANCEUR CLEAR 12/07/2020 2059   LABSPEC 1.009 12/07/2020 2059   PHURINE 8.0 12/07/2020 2059   GLUCOSEU NEGATIVE 12/07/2020 2059   HGBUR SMALL (A) 12/07/2020 2059   BILIRUBINUR NEGATIVE 12/07/2020 2059   BILIRUBINUR Negative 11/10/2020 0903   KETONESUR 20 (A) 12/07/2020 2059   PROTEINUR NEGATIVE 12/07/2020 2059   UROBILINOGEN negative (A) 11/10/2020 0903   NITRITE NEGATIVE 12/07/2020 2059   LEUKOCYTESUR NEGATIVE 12/07/2020 2059         Time coordinating discharge: Over 45 minutes  SIGNED: 2060, MD, FACP, FHM. [facility],  Please use [email or web address] to Page If 7PM-7AM, please contact night-coverage [email or web address], Password [facility] 12/10/2020, 11:12 AM

## 2020-12-10 NOTE — TOC Initial Note (Signed)
Transition of Care [facility]) - Initial/Assessment Note    Patient Details  Name: Angela Ramirez MRN: [id number] Date of Birth: Apr 19, 1946  Transition of [facility] CM/SW Contact:    Angela Sabal, RN Phone Number: 12/10/2020, 11:01 AM  Clinical Narrative:            Spoke w patient over the phone. She confirms that she does not want to go to SNF, wants to DC to home w [facility] services. She is requesting hospital bed, 3/1 and rollator. She states she will stay at her daughter Angela Ramirez's house at  [address] [place] [address] Angela Ramirez Daughter [phone]  Spoke w Angela Ramirez over the phone. She verified her mom will be at her house after DC. She is agreeable to hospital bed and 3/1 being delivered to the house, and rollator to be delivered to the hospital room. Requested items from [employer]. Bed and rollator to be delivered to the house on Monday. There is no preference for [facility] agency. [facility] could not accept, bit [facility] could for start of care Tuesday for PT and OT. Request HH orders from Attending.         Expected Discharge Plan: Home w Home Health Services Barriers to Discharge: Continued Medical Work up   Patient Goals and CMS Choice Patient states their goals for this hospitalization and ongoing recovery are:: patient states that she wants to go home with home health services CMS Medicare.gov Compare Post Acute Care list provided to:: Patient Choice offered to / list presented to : Patient  Expected Discharge Plan and Services Expected Discharge Plan: Home w Home Health Services In-house Referral: Clinical Social Work Discharge Planning Services: CM Consult Post Acute Care Choice: Home Health Living arrangements for the past 2 months: Apartment                 DME Arranged: 3-N-1, Hospital bed, Walker rolling with seat DME Agency: [facility] Date DME Agency Contacted: 12/10/20 Time DME Agency Contacted: 1059 Representative spoke with at DME Agency: Angela Ramirez HH Arranged:  PT, OT HH Agency: [facility] Date HH Agency Contacted: 12/10/20 Time HH Agency Contacted: 1059    Prior Living Arrangements/Services Living arrangements for the past 2 months: Apartment Lives with:: Self Patient language and need for interpreter reviewed:: Yes Do you feel safe going back to the place where you live?: Yes      Need for Family Participation in Patient Care: Yes (Comment) Care giver support system in place?: No (comment)   Criminal Activity/Legal Involvement Pertinent to Current Situation/Hospitalization: No - Comment as needed  Activities of Daily Living      Permission Sought/Granted Permission sought to share information with : Facility Medical sales representative, Family Supports Permission granted to share information with : Yes, Verbal Permission Granted  Share Information with NAME: Angela Ramirez  Permission granted to share info w AGENCY: SNFs  Permission granted to share info w Relationship: Son  Permission granted to share info w Contact Information: [phone]  Emotional Assessment Appearance:: Appears stated age Attitude/Demeanor/Rapport: Engaged Affect (typically observed): Accepting, Appropriate Orientation: : Oriented to Self, Oriented to Place, Oriented to  Time, Oriented to Situation Alcohol / Substance Use: Not Applicable Psych Involvement: No (comment)  Admission diagnosis:  Dehydration [E86.0] Hypokalemia [E87.6] Elevated liver function tests [R79.89] Acute febrile illness [R50.9] Generalized weakness [R53.1] Sepsis (HCC) [A41.9] Diarrhea, unspecified type [R19.7] Patient Active Problem List   Diagnosis Date Noted   Diverticulitis large intestine 12/06/2020   Severe sepsis (HCC) 12/03/2020   Painful urination 11/10/2020  Left foot pain 07/14/2020   Situational anxiety 07/14/2020   Allergic rhinitis 02/11/2020   Dysfunction of Eustachian tube, bilateral 02/11/2020   Vertigo 02/11/2020   Constipation 08/20/2019   GERD  (gastroesophageal reflux disease) 02/19/2019   Travel advice encounter 05/07/2018   Finger pain, right 02/20/2018   Low back pain 02/20/2018   Liver fibrosis 10/09/2017   Need for prophylactic vaccination and inoculation against viral hepatitis 10/09/2017   Hep C w/o coma, chronic (HCC) 08/26/2017   Routine general medical examination at a health care facility 08/18/2017   Right shoulder pain 08/18/2017   Essential hypertension 09/21/2015   Arnold-Chiari malformation (HCC) 09/21/2015   Insomnia 09/21/2015   PTSD (post-traumatic stress disorder) 09/21/2015   PCP:  Angela Broker, MD Pharmacy:   [id number] [place], [place] - [address] AT [facility] [address] [facility] [address] [place] [address] Phone: [phone] Fax: [phone]     Social Determinants of Health (SDOH) Interventions    Readmission Risk Interventions No flowsheet data found.

## 2020-12-11 LAB — BASIC METABOLIC PANEL
Anion gap: 9 (ref 5–15)
BUN: 9 mg/dL (ref 8–23)
CO2: 27 mmol/L (ref 22–32)
Calcium: 8.8 mg/dL — ABNORMAL LOW (ref 8.9–10.3)
Chloride: 102 mmol/L (ref 98–111)
Creatinine, Ser: 0.71 mg/dL (ref 0.44–1.00)
GFR, Estimated: >60 mL/min (ref >60)
Glucose, Bld: 96 mg/dL (ref 70–99)
Potassium: 3.9 mmol/L (ref 3.5–5.1)
Sodium: 138 mmol/L (ref 135–145)

## 2020-12-11 LAB — CBC
HCT: 31.6 % — ABNORMAL LOW (ref 36.0–46.0)
Hemoglobin: 9.9 g/dL — ABNORMAL LOW (ref 12.0–15.0)
MCH: 27.7 pg (ref 26.0–34.0)
MCHC: 31.3 g/dL (ref 30.0–36.0)
MCV: 88.3 fL (ref 80.0–100.0)
Platelets: 436 10*3/uL — ABNORMAL HIGH (ref 150–400)
RBC: 3.58 MIL/uL — ABNORMAL LOW (ref 3.87–5.11)
RDW: 16.9 % — ABNORMAL HIGH (ref 11.5–15.5)
WBC: 7 10*3/uL (ref 4.0–10.5)
nRBC: 0 % (ref 0.0–0.2)

## 2020-12-11 NOTE — Discharge Summary (Signed)
Physician Discharge Summary  Angela BlightSheila Ramirez ZOX:[id number] DOB: July 18, 1945 DOA: 12/02/2020  PCP: Myrlene Brokerrawford, Elizabeth A, MD  Admit date: 12/02/2020 Discharge date: 12/11/2020  Admitted From: Home  [facility]  [facility]   Recommendations for Outpatient Follow-up:  Follow up with Dr. Roseanne RenoHassan in 1-2 weeks Dr. Roseanne RenoHassan: Please obtain BMP in one week Follow up with Ophthalmologist for vision loss as soon as able       Home Health: PT OT due to ongoing balance issues  Equipment/Devices: 3-in-1, 4WW, hospital bed  Discharge Condition: Fair  CODE STATUS: FULL Diet recommendation: Regular  Brief/Interim Summary: Angela Ramirez is a 75 y.o. F with HTN, hx hep C with compensated cirrhosis, Arnold-chiari malformation, IBS, and recent left eye surgery who presented with weakness, vomiting.  In the ER, febrile, tachycardic, tachenpic and hypotensive.  She had acute respiratory failure in the ER due to Sepsis and was intubated and admitted to the ICU.        PRINCIPAL HOSPITAL DIAGNOSIS: Severe sepsis due to cholangitis    Discharge Diagnoses:  Severe sepsis due to cholangitis On admission, patient had fever, hypotension and elevated LFTs.  Developed respiratory failure due to sepsis. CT abdomen and pelvis was notable for maybe diverticulitis.    Imaging ruled out CBD dilation and cholecystitis.  Given triad of hypotension, fever and transaminitis, suspect this was cholangitis.    Treated with broad spectrum antibiotics for 10 days, LFTs resolved, WBC normalized, HR and BP and temp and respirations and mentaiton normalized.  Taking PO well.       Acute hypoxic respiratory failure Not on O2 at home, here was tachypneic and appeared to have respiratory collapse and so was intubated.  Lukcily extubated within 24 hours.  Vision loss, left eye MRI brain normal. Discussed with Ophthalmology, Dr. Delaney MeigsStonecipher by my partner, who recommended outpatient follow up. -Ophtlamology follow  up  AKI ruled out  Hypertension  Acute metabolic encpehalopathy due to sepsis Was somnolent and confused on admission, normally is alert, oriented and independent.  Cirrhosis Compensated    Discharge Instructions  Discharge Instructions     Activity as tolerated - No restrictions   Complete by: As directed    Call MD for:  difficulty breathing, headache or visual disturbances   Complete by: As directed    Call MD for:  persistant dizziness or light-headedness   Complete by: As directed    Call MD for:  persistant nausea and vomiting   Complete by: As directed    Call MD for:  redness, tenderness, or signs of infection (pain, swelling, redness, odor or green/yellow discharge around incision site)   Complete by: As directed    Call MD for:  temperature >100.4   Complete by: As directed    Diet - low sodium heart healthy   Complete by: As directed    Discharge instructions   Complete by: As directed    From Dr. Maryfrances Bunnellanford: You were admitted for severe infection, which we believe was from diverticulitis. You were treated with antibiotics and completed the course here.  Follow up with Dr. Okey Duprerawford as soon as able, later this week or next  As we discussed, specialized or restricted diets (avoiding foods with nuts, seeds, kernels such as popcorn or fruits/tomatoes/etc) have been suggested in the past to prevent diverticulitis but have not shown convincing data that they are effective.  Consider this practice as you see fit.  Also, as we discussed, your infection progressed rapidly, as these do, and it is  normal for these to develop so suddenly that by the time symptoms seem bad enough to seek care, they are already serious.  There is nothing that you could reasonably have done to prevent this from happening.  Follow-up with ophthalmologist as recommended  Need referral on future (4-6 weeks) follow-up with gastroenterologist for further evaluation and recommendations   Increase  activity slowly   Complete by: As directed       Allergies as of 12/11/2020       Reactions   Crab (diagnostic) Hives   Morphine And Related Nausea And Vomiting   Sulfa Antibiotics Nausea And Vomiting        Medication List     STOP taking these medications    Advanced Formula Eye Drops 0.05-0.1-1-1 % Soln Generic drug: Tetrahydroz-Dextran-PEG-Povid   BIOTIN PO   cetirizine 10 MG tablet Commonly known as: ZYRTEC   Cold & Flu Severe Daytime 5-10-200-325 MG Tabs Generic drug: Phenylephrine-DM-GG-APAP   etodolac 200 MG capsule Commonly known as: LODINE   guaifenesin 400 MG Tabs tablet Commonly known as: HUMIBID E   hydrochlorothiazide 25 MG tablet Commonly known as: HYDRODIURIL   meloxicam 15 MG tablet Commonly known as: MOBIC   traMADol 50 MG tablet Commonly known as: ULTRAM   Zinc Lozg       TAKE these medications    Acetaminophen-Caffeine 500-65 MG Tabs Take 1 tablet by mouth every 6 (six) hours as needed (headache). Tension relief   acidophilus Caps capsule Take 1 capsule by mouth 3 (three) times daily for 10 days.   amLODipine 10 MG tablet Commonly known as: NORVASC Take 1 tablet (10 mg total) by mouth daily. What changed:  when to take this reasons to take this   GINGER ROOT PO Take 1 tablet by mouth daily as needed (immune system boost).   ibuprofen 800 MG tablet Commonly known as: ADVIL Take 1 tablet (800 mg total) by mouth 3 (three) times daily. What changed:  when to take this reasons to take this   linaclotide 72 MCG capsule Commonly known as: Linzess TAKE 1 CAPSULE(72 MCG) BY MOUTH DAILY BEFORE BREAKFAST What changed:  how much to take how to take this when to take this reasons to take this additional instructions   ondansetron 4 MG tablet Commonly known as: Zofran Take 1 tablet (4 mg total) by mouth every 8 (eight) hours as needed for nausea or vomiting.   polyethylene glycol 17 g packet Commonly known as: MIRALAX /  GLYCOLAX Take 17 g by mouth daily for 5 days.               Durable Medical Equipment  (From admission, onward)           Start     Ordered   12/10/20 1050  For home use only DME Hospital bed  Once       Question Answer Comment  Length of Need 6 Months   Patient has (list medical condition): weakness   The above medical condition requires: Patient requires the ability to reposition frequently   Bed type Semi-electric   Support Surface: Gel Overlay      12/10/20 1050   12/10/20 1050  For home use only DME 3 n 1  Once        12/10/20 1050   12/10/20 1050  For home use only DME 4 wheeled rolling walker with seat  Once       Question:  Patient needs a walker to treat with  the following condition  Answer:  Weakness   12/10/20 1050            Follow-up Information     [facility] Follow up.   Specialty: Home Health Services Why: For home health services, they will call you in 1-2 days to set up a time to come out to the house. They were provided with Tamara's number. Contact information: [address] [place] [address] [phone]         Myrlene Broker, MD. Schedule an appointment as soon as possible for a visit in 1 week(s).   Specialty: Internal Medicine Contact information: [address] [place] [address] [phone]                Allergies  Allergen Reactions   Crab (Diagnostic) Hives   Morphine And Related Nausea And Vomiting   Sulfa Antibiotics Nausea And Vomiting    Consultations: Critical Care   Procedures/Studies: CT HEAD WO CONTRAST  Result Date: 12/04/2020 CLINICAL DATA:  Altered mental status with loss of vision left eye EXAM: CT HEAD WITHOUT CONTRAST TECHNIQUE: Contiguous axial images were obtained from the base of the skull through the vertex without intravenous contrast. COMPARISON:  December 03, 2020 FINDINGS: Brain: Ventricles and sulci are normal in size and configuration. There is  no intracranial mass, hemorrhage, extra-axial fluid collection, or midline shift. There is slight decreased attenuation in areas of the right centrum semiovale anteriorly and posteriorly. There is subtle decreased attenuation in the head of the right caudate nucleus and adjacent anterior limb of the right internal capsule. There is evidence of a prior small infarct in the anterior right thalamus. Vascular: No hyperdense vessel. Calcification noted in each carotid siphon region. Skull: Bony calvarium appears intact Sinuses/Orbits: Visualized paranasal sinuses are clear. Orbits appear symmetric bilaterally. Other: Mastoid air cells clear. IMPRESSION: Mild periventricular small vessel disease. 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 Subtle decreased attenuation in portions of the head of the caudate nucleus on the right and anterior limb right internal capsule, not convincingly seen 1 day prior. Earliest changes of small infarct in this area cannot be excluded. No mass or hemorrhage. Foci of arterial vascular calcification evident. These results will be called to the ordering clinician or representative by the Radiologist Assistant, and communication documented in the PACS or [employer]. Electronically Signed   By: Bretta Bang III M.D.   On: 12/04/2020 14:33   CT HEAD WO CONTRAST  Result Date: 12/03/2020 CLINICAL DATA:  Altered mental status EXAM: CT HEAD WITHOUT CONTRAST TECHNIQUE: Contiguous axial images were obtained from the base of the skull through the vertex without intravenous contrast. COMPARISON:  None. FINDINGS: Brain: No acute intracranial abnormality. Specifically, no hemorrhage, hydrocephalus, mass lesion, acute infarction, or significant intracranial injury. Vascular: No hyperdense vessel or unexpected calcification. Skull: No acute calvarial abnormality. Sinuses/Orbits: No acute findings Other: None IMPRESSION: No acute intracranial abnormality. Electronically Signed   By:  Charlett Nose M.D.   On: 12/03/2020 03:25   CT Chest W Contrast  Addendum Date: 12/03/2020   ADDENDUM REPORT: 12/03/2020 03:48 ADDENDUM: Additional impression point: Stable calcified lymph nodes present in the porta hepatis. Results and critical findings were called by telephone at the time of interpretation on 12/03/2020 at 3:48 am to provider Dr. Judd Lien, Who verbally acknowledged these results. Electronically Signed   By: Kreg Shropshire M.D.   On: 12/03/2020 03:48   Result Date: 12/03/2020 CLINICAL DATA:  Abnormal chest radiograph.  Concern for pneumonia. Abdominal pain. Altered mental status. EXAM: CT CHEST, ABDOMEN, AND PELVIS WITH CONTRAST TECHNIQUE: Multidetector CT imaging of the chest, abdomen and pelvis was performed following the standard protocol during bolus administration of intravenous contrast. CONTRAST:  OMNIPAQUE IOHEXOL 300 MG/ML  SOLN COMPARISON:  Radiograph 12/03/2020, CT abdomen and pelvis 10/07/2019 FINDINGS: CT CHEST FINDINGS Cardiovascular: Cardiac size within normal limits. Few scattered coronary artery calcifications. Calcifications of the mitral annulus and aortic leaflets. No sizable pericardial effusion. Atherosclerotic plaque within the normal caliber aorta. No acute luminal abnormality of the imaged aorta. No periaortic stranding or hemorrhage. Normal 3 vessel branching of the aortic arch. Proximal great vessels are unremarkable. Central pulmonary arteries are normal caliber. No large central filling defects within limitations of this non tailored examination of the pulmonary arteries. No major venous abnormalities are seen. Mediastinum/Nodes: Endotracheal intubation with the tip of the endotracheal tube low in the trachea, approximately 8 mm from the carina. Recommend retraction 4 cm to position in the mid trachea. Transesophageal tube in place with tip in the gastric lumen and side port distal to the GE junction. No acute abnormality of the trachea or esophagus. No concerning  thyroid nodules or masses. Thoracic inlet is otherwise unremarkable. Small amount of fluid in the pericardial recesses within normal limits. No free mediastinal fluid or gas. No concerning mediastinal, hilar or axillary adenopathy is seen. Lungs/Pleura: Streaky bandlike opacities in dependent and basilar portions of the lungs likely reflecting atelectatic change. Trace left pleural effusion. No right effusion. No pneumothorax. Some mild mosaic attenuation of the lungs is nonspecific, could reflect underlying air trapping or small airways disease or accentuation by imaging during exhalation. Mild centrilobular emphysematous change. No concerning pulmonary nodules or masses. Musculoskeletal: Degenerative changes are present in the imaged spine and shoulders. CT ABDOMEN PELVIS FINDINGS Hepatobiliary: Slightly lobular hepatic surface contour with hypertrophy of the caudate left lobe liver compatible with patient history of cirrhosis. No discernible focal liver lesion is seen. Mild heterogeneity of the hepatic enhancement is fairly typical of cirrhotic change. Normal gallbladder and biliary tree without visible calcified gallstones. Pancreas: No pancreatic ductal dilatation or surrounding inflammatory changes. Spleen: Normal in size. No concerning splenic lesions. Adrenals/Urinary Tract: Normal adrenals. Kidneys are normally located with symmetric enhancementand excretion. Few tiny subcentimeter hypoattenuating foci in both kidneys, too small to fully characterize on CT imaging but statistically likely benign. No suspicious renal lesion, urolithiasis or hydronephrosis. Urinary bladder is free of acute abnormality. Stomach/Bowel: Transesophageal tube, as above stomach and duodenum are unremarkable. Normal duodenal sweep across the midline abdomen. No small bowel thickening or dilatation normal appendix in the right lower quadrant coursing in a retrocecal fashion. No periappendiceal inflammation to suggest acute  appendicitis. Extensive distal colonic diverticulosis question some very faint Peridiverticular inflammation of the proximal sigmoid (3/102) could reflect an early or developing diverticulitis in the appropriate clinical setting. No extraluminal gas, organized collection or abscess formation. Vascular/Lymphatic: Atherosclerotic calcifications within the abdominal aorta and branch vessels. No aneurysm or ectasia. No enlarged abdominopelvic lymph nodes. Reproductive: Anteverted uterus.  No concerning adnexal lesions. Other: Soft tissue gas along the right low anterior abdominal wall likely related to injectable use. No abdominopelvic free air or fluid. No bowel containing hernia. Mild ventral rectus diastasis. Musculoskeletal: Multilevel degenerative changes are present in the imaged portions of the spine. Grade 1 anterolisthesis L5 on S1 in maximal degenerative changes at this level. Multilevel Schmorl's node formations. Additional mild-to-moderate degenerative changes in the hips and pelvis. No acute osseous  abnormality or suspicious osseous lesion. IMPRESSION: 1. Some dependent areas of bandlike opacity favoring atelectatic change with trace left pleural effusion. No other consolidative process or CT evidence of edema. 2. Extensive distal colonic diverticulosis with a subtle focus of peridiverticular inflammation in the proximal sigmoid, could reflect early or developing diverticulitis in the appropriate clinical setting. No extraluminal gas, organized collection or abscess. 3. Nonspecific mosaic attenuation in the lungs, could reflect underlying air trapping or small airways disease. 4. Stigmata of hepatic cirrhosis.  No concerning focal liver lesion. 5. Appropriate positioning of the transesophageal tube. 6. Low positioning of the endotracheal tube, 8 cm from the carina. Recommend retraction 4 cm to the mid trachea. 7. Aortic Atherosclerosis (ICD10-I70.0). 8. Emphysema (ICD10-J43.9). Currently attempting to  contact the ordering provider with a critical value result. Addendum will be submitted upon case discussion. Electronically Signed: By: Kreg Shropshire M.D. On: 12/03/2020 03:36   MR BRAIN WO CONTRAST  Result Date: 12/06/2020 CLINICAL DATA:  History of nausea and vomiting. Altered mental status. Left eye vision disturbance. EXAM: MRI HEAD WITHOUT CONTRAST TECHNIQUE: Multiplanar, multiecho pulse sequences of the brain and surrounding structures were obtained without intravenous contrast. COMPARISON:  Head CT 2 days ago. FINDINGS: The study suffers from moderate motion degradation. Brain: Diffusion imaging does not show any acute or subacute infarction. The brainstem and cerebellum are normal. Cerebral hemispheres are normal for age. No sign of small vessel change. No cortical or large vessel territory infarction. Mass lesion, hemorrhage, hydrocephalus or extra-axial collection. Vascular: Major vessels at the base of the brain show flow. Skull and upper cervical spine: Negative Sinuses/Orbits: Clear/normal Other: None IMPRESSION: Motion degraded study. No acute abnormality suspected. No abnormality seen to explain the presenting symptoms. Brain appears normal for age. Electronically Signed   By: Paulina Fusi M.D.   On: 12/06/2020 12:49   CT Abdomen Pelvis W Contrast  Addendum Date: 12/03/2020   ADDENDUM REPORT: 12/03/2020 03:48 ADDENDUM: Additional impression point: Stable calcified lymph nodes present in the porta hepatis. Results and critical findings were called by telephone at the time of interpretation on 12/03/2020 at 3:48 am to provider Dr. Judd Lien, Who verbally acknowledged these results. Electronically Signed   By: Kreg Shropshire M.D.   On: 12/03/2020 03:48   Result Date: 12/03/2020 CLINICAL DATA:  Abnormal chest radiograph. Concern for pneumonia. Abdominal pain. Altered mental status. EXAM: CT CHEST, ABDOMEN, AND PELVIS WITH CONTRAST TECHNIQUE: Multidetector CT imaging of the chest, abdomen and pelvis was  performed following the standard protocol during bolus administration of intravenous contrast. CONTRAST:  OMNIPAQUE IOHEXOL 300 MG/ML  SOLN COMPARISON:  Radiograph 12/03/2020, CT abdomen and pelvis 10/07/2019 FINDINGS: CT CHEST FINDINGS Cardiovascular: Cardiac size within normal limits. Few scattered coronary artery calcifications. Calcifications of the mitral annulus and aortic leaflets. No sizable pericardial effusion. Atherosclerotic plaque within the normal caliber aorta. No acute luminal abnormality of the imaged aorta. No periaortic stranding or hemorrhage. Normal 3 vessel branching of the aortic arch. Proximal great vessels are unremarkable. Central pulmonary arteries are normal caliber. No large central filling defects within limitations of this non tailored examination of the pulmonary arteries. No major venous abnormalities are seen. Mediastinum/Nodes: Endotracheal intubation with the tip of the endotracheal tube low in the trachea, approximately 8 mm from the carina. Recommend retraction 4 cm to position in the mid trachea. Transesophageal tube in place with tip in the gastric lumen and side port distal to the GE junction. No acute abnormality of the trachea or esophagus. No concerning  thyroid nodules or masses. Thoracic inlet is otherwise unremarkable. Small amount of fluid in the pericardial recesses within normal limits. No free mediastinal fluid or gas. No concerning mediastinal, hilar or axillary adenopathy is seen. Lungs/Pleura: Streaky bandlike opacities in dependent and basilar portions of the lungs likely reflecting atelectatic change. Trace left pleural effusion. No right effusion. No pneumothorax. Some mild mosaic attenuation of the lungs is nonspecific, could reflect underlying air trapping or small airways disease or accentuation by imaging during exhalation. Mild centrilobular emphysematous change. No concerning pulmonary nodules or masses. Musculoskeletal: Degenerative changes are  present in the imaged spine and shoulders. CT ABDOMEN PELVIS FINDINGS Hepatobiliary: Slightly lobular hepatic surface contour with hypertrophy of the caudate left lobe liver compatible with patient history of cirrhosis. No discernible focal liver lesion is seen. Mild heterogeneity of the hepatic enhancement is fairly typical of cirrhotic change. Normal gallbladder and biliary tree without visible calcified gallstones. Pancreas: No pancreatic ductal dilatation or surrounding inflammatory changes. Spleen: Normal in size. No concerning splenic lesions. Adrenals/Urinary Tract: Normal adrenals. Kidneys are normally located with symmetric enhancementand excretion. Few tiny subcentimeter hypoattenuating foci in both kidneys, too small to fully characterize on CT imaging but statistically likely benign. No suspicious renal lesion, urolithiasis or hydronephrosis. Urinary bladder is free of acute abnormality. Stomach/Bowel: Transesophageal tube, as above stomach and duodenum are unremarkable. Normal duodenal sweep across the midline abdomen. No small bowel thickening or dilatation normal appendix in the right lower quadrant coursing in a retrocecal fashion. No periappendiceal inflammation to suggest acute appendicitis. Extensive distal colonic diverticulosis question some very faint Peridiverticular inflammation of the proximal sigmoid (3/102) could reflect an early or developing diverticulitis in the appropriate clinical setting. No extraluminal gas, organized collection or abscess formation. Vascular/Lymphatic: Atherosclerotic calcifications within the abdominal aorta and branch vessels. No aneurysm or ectasia. No enlarged abdominopelvic lymph nodes. Reproductive: Anteverted uterus.  No concerning adnexal lesions. Other: Soft tissue gas along the right low anterior abdominal wall likely related to injectable use. No abdominopelvic free air or fluid. No bowel containing hernia. Mild ventral rectus diastasis. Musculoskeletal:  Multilevel degenerative changes are present in the imaged portions of the spine. Grade 1 anterolisthesis L5 on S1 in maximal degenerative changes at this level. Multilevel Schmorl's node formations. Additional mild-to-moderate degenerative changes in the hips and pelvis. No acute osseous abnormality or suspicious osseous lesion. IMPRESSION: 1. Some dependent areas of bandlike opacity favoring atelectatic change with trace left pleural effusion. No other consolidative process or CT evidence of edema. 2. Extensive distal colonic diverticulosis with a subtle focus of peridiverticular inflammation in the proximal sigmoid, could reflect early or developing diverticulitis in the appropriate clinical setting. No extraluminal gas, organized collection or abscess. 3. Nonspecific mosaic attenuation in the lungs, could reflect underlying air trapping or small airways disease. 4. Stigmata of hepatic cirrhosis.  No concerning focal liver lesion. 5. Appropriate positioning of the transesophageal tube. 6. Low positioning of the endotracheal tube, 8 cm from the carina. Recommend retraction 4 cm to the mid trachea. 7. Aortic Atherosclerosis (ICD10-I70.0). 8. Emphysema (ICD10-J43.9). Currently attempting to contact the ordering provider with a critical value result. Addendum will be submitted upon case discussion. Electronically Signed: By: Kreg Shropshire M.D. On: 12/03/2020 03:36   DG CHEST PORT 1 VIEW  Result Date: 12/03/2020 CLINICAL DATA:  75 year old female with intubation EXAM: PORTABLE CHEST 1 VIEW COMPARISON:  12/03/2020, CT chest 12/03/2020 FINDINGS: Cardiomediastinal silhouette unchanged in size and contour. No evidence of new interlobular septal thickening. Endotracheal tube terminates suitably  above the carina, unchanged, 3.5 cm. Gastric tube traverses the mediastinum and terminates out of the field of view, unchanged. Blunting at the left costophrenic angle with partial obscuration of the left hemidiaphragm and the  left heart border. Coarsened interstitial markings. IMPRESSION: Unchanged and suitable E positioned endotracheal tube and gastric tube. Opacity at the left lung base, likely a combination of small pleural fluid with associated atelectasis/consolidation. Electronically Signed   By: Gilmer Mor D.O.   On: 12/03/2020 09:36   DG Chest Portable 1 View  Result Date: 12/03/2020 CLINICAL DATA:  Post intubation EXAM: PORTABLE CHEST 1 VIEW COMPARISON:  12/02/2020 FINDINGS: Endotracheal tube is 2 cm above the carina. NG tube is in the stomach. Heart is normal size. Left base atelectasis or infiltrate. Right lung clear. No effusions or acute bony abnormality. IMPRESSION: Support devices in expected position as above. Left base atelectasis or infiltrate. Electronically Signed   By: Charlett Nose M.D.   On: 12/03/2020 02:27   DG Chest Port 1 View  Result Date: 12/02/2020 CLINICAL DATA:  Questionable sepsis EXAM: PORTABLE CHEST 1 VIEW COMPARISON:  None. FINDINGS: Heart and mediastinal contours are within normal limits. No focal opacities or effusions. No acute bony abnormality. IMPRESSION: No active disease. Electronically Signed   By: Charlett Nose M.D.   On: 12/02/2020 22:51      Subjective: Feeling well.  Tired,.  No fever.  No confusion. No cough, no abdominal pian, no vomiting.  Appetite reduced, but improving.    Discharge Exam: Vitals:   12/11/20 0733 12/11/20 1144  BP: 134/70 136/65  Pulse: 87 97  Resp: 19 20  Temp: 98.5 F (36.9 C) 98.2 F (36.8 C)  SpO2: 95% 99%   Vitals:   12/11/20 0343 12/11/20 0347 12/11/20 0733 12/11/20 1144  BP: 138/80 138/80 134/70 136/65  Pulse: 80 80 87 97  Resp: 16 16 19 20   Temp: 98.2 F (36.8 C) 98.2 F (36.8 C) 98.5 F (36.9 C) 98.2 F (36.8 C)  TempSrc: Oral Oral Oral Oral  SpO2: 98% 96% 95% 99%  Weight:  87 kg    Height:        General: Pt is alert, awake, not in acute distress Cardiovascular: RRR, nl S1-S2, no murmurs appreciated.   No LE edema.    Respiratory: Normal respiratory rate and rhythm.  CTAB without rales or wheezes. Abdominal: Abdomen soft and non-tender.  No distension or HSM.   Neuro/Psych: Strength symmetric in upper and lower extremities.  Judgment and insight appear normal.   The results of significant diagnostics from this hospitalization (including imaging, microbiology, ancillary and laboratory) are listed below for reference.     Microbiology: Recent Results (from the past 240 hour(s))  Blood Culture (routine x 2)     Status: None   Collection Time: 12/02/20  8:50 PM   Specimen: BLOOD LEFT HAND  Result Value Ref Range Status   Specimen Description BLOOD LEFT HAND  Final   Special Requests   Final    BOTTLES DRAWN AEROBIC AND ANAEROBIC Blood Culture adequate volume   Culture   Final    NO GROWTH 5 DAYS Performed at [facility], [address] [place], [place]    Report Status 12/07/2020 FINAL  Final  Blood Culture (routine x 2)     Status: None   Collection Time: 12/02/20  9:15 PM   Specimen: BLOOD RIGHT WRIST  Result Value Ref Range Status   Specimen Description BLOOD RIGHT WRIST  Final   Special Requests   Final    BOTTLES DRAWN AEROBIC AND ANAEROBIC Blood Culture adequate volume   Culture   Final    NO GROWTH 5 DAYS Performed at [facility], [address] [place], [place] [address]    Report Status 12/07/2020 FINAL  Final  Resp Panel by RT-PCR (Flu A&B, Covid) Nasopharyngeal Swab     Status: None   Collection Time: 12/02/20 11:09 PM   Specimen: Nasopharyngeal Swab; Nasopharyngeal(NP) swabs in vial transport medium  Result Value Ref Range Status   SARS Coronavirus 2 by RT PCR NEGATIVE NEGATIVE Final    Comment: (NOTE) SARS-CoV-2 target nucleic acids are NOT DETECTED.  The SARS-CoV-2 RNA is generally detectable in upper respiratory specimens during the acute phase of infection. The lowest concentration of SARS-CoV-2 viral copies this assay can detect is 138  copies/mL. A negative result does not preclude SARS-Cov-2 infection and should not be used as the sole basis for treatment or other patient management decisions. A negative result may occur with  improper specimen collection/handling, submission of specimen other than nasopharyngeal swab, presence of viral mutation(s) within the areas targeted by this assay, and inadequate number of viral copies(<138 copies/mL). A negative result must be combined with clinical observations, patient history, and epidemiological information. The expected result is Negative.  Fact Sheet for Patients:  BloggerCourse.com  Fact Sheet for Healthcare Providers:  SeriousBroker.it  This test is no t yet approved or cleared by the [place] FDA and  has been authorized for detection and/or diagnosis of SARS-CoV-2 by FDA under an Emergency Use Authorization (EUA). This EUA will remain  in effect (meaning this test can be used) for the duration of the COVID-19 declaration under Section 564(b)(1) of the Act, 21 U.S.C.section 360bbb-3(b)(1), unless the authorization is terminated  or revoked sooner.       Influenza A by PCR NEGATIVE NEGATIVE Final   Influenza B by PCR NEGATIVE NEGATIVE Final    Comment: (NOTE) The Xpert Xpress SARS-CoV-2/FLU/RSV plus assay is intended as an aid in the diagnosis of influenza from Nasopharyngeal swab specimens and should not be used as a sole basis for treatment. Nasal washings and aspirates are unacceptable for Xpert Xpress SARS-CoV-2/FLU/RSV testing.  Fact Sheet for Patients: BloggerCourse.com  Fact Sheet for Healthcare Providers: SeriousBroker.it  This test is not yet approved or cleared by the [place] FDA and has been authorized for detection and/or diagnosis of SARS-CoV-2 by FDA under an Emergency Use Authorization (EUA). This EUA will remain in effect (meaning  this test can be used) for the duration of the COVID-19 declaration under Section 564(b)(1) of the Act, 21 U.S.C. section 360bbb-3(b)(1), unless the authorization is terminated or revoked.  Performed at [facility], [address] [place], [place] [address]   Urine culture     Status: None   Collection Time: 12/03/20 12:17 AM   Specimen: In/Out Cath Urine  Result Value Ref Range Status   Specimen Description IN/OUT CATH URINE  Final   Special Requests NONE  Final   Culture   Final    NO GROWTH Performed at [facility], [address] [place], [place] [address]    Report Status 12/04/2020 FINAL  Final  MRSA Next Gen by PCR, Nasal     Status: None   Collection Time: 12/03/20  2:18 AM  Result Value Ref Range Status   MRSA by PCR Next Gen NOT DETECTED NOT DETECTED Final  Comment: (NOTE) The GeneXpert MRSA Assay (FDA approved for NASAL specimens only), is one component of a comprehensive MRSA colonization surveillance program. It is not intended to diagnose MRSA infection nor to guide or monitor treatment for MRSA infections. Test performance is not FDA approved in patients less than 56 years old. Performed at [facility], [address] [place], [place] [address]      Labs: BNP (last 3 results) No results for input(s): BNP in the last 8760 hours. Basic Metabolic Panel: Recent Labs  Lab 12/05/20 0518 12/06/20 0212 12/07/20 0018 12/08/20 0020 12/09/20 0028 12/10/20 0751 12/11/20 0019  NA 134* 136 139 139 139 137 138  K 3.3* 3.8 3.1* 3.4* 3.2* 3.9 3.9  CL 103 105 104 100 100 104 102  CO2 21* 23 28 29 30 26 27   GLUCOSE 87 90 111* 107* 117* 123* 96  BUN 18 14 7* 5* 10 8 9   CREATININE 0.89 0.75 0.64 0.66 0.74 0.72 0.71  CALCIUM 8.3* 8.4* 8.2* 8.4* 8.5* 8.8* 8.8*  MG 2.1 1.9 1.7 1.6* 1.8  --   --   PHOS 3.1 3.6 4.5 3.7 3.4  --   --    Liver Function Tests: Recent Labs  Lab 12/06/20 0212 12/07/20 0018 12/08/20 0020 12/09/20 0028  12/10/20 0751  AST 206* 162* 94* 58* 37  ALT 104* 98* 78* 61* 44  ALKPHOS 267* 379* 356* 312* 257*  BILITOT 1.5* 1.0 1.3* 1.0 0.7  PROT 5.6* 5.9* 6.3* 6.5 6.9  ALBUMIN 2.0* 2.2* 2.4* 2.5* 2.7*   No results for input(s): LIPASE, AMYLASE in the last 168 hours. No results for input(s): AMMONIA in the last 168 hours. CBC: Recent Labs  Lab 12/06/20 0212 12/07/20 0018 12/08/20 0020 12/09/20 0028 12/11/20 0019  WBC 6.8 6.4 6.2 7.6 7.0  HGB 9.4* 9.6* 11.2* 10.3* 9.9*  HCT 28.0* 28.6* 32.5* 31.6* 31.6*  MCV 84.8 84.1 82.5 85.2 88.3  PLT 186 224 256 340 436*   Cardiac Enzymes: No results for input(s): CKTOTAL, CKMB, CKMBINDEX, TROPONINI in the last 168 hours. BNP: Invalid input(s): POCBNP CBG: Recent Labs  Lab 12/04/20 2139 12/09/20 2040  GLUCAP 85 133*   D-Dimer No results for input(s): DDIMER in the last 72 hours. Hgb A1c No results for input(s): HGBA1C in the last 72 hours. Lipid Profile No results for input(s): CHOL, HDL, LDLCALC, TRIG, CHOLHDL, LDLDIRECT in the last 72 hours. Thyroid function studies No results for input(s): TSH, T4TOTAL, T3FREE, THYROIDAB in the last 72 hours.  Invalid input(s): FREET3 Anemia work up No results for input(s): VITAMINB12, FOLATE, FERRITIN, TIBC, IRON, RETICCTPCT in the last 72 hours. Urinalysis    Component Value Date/Time   COLORURINE YELLOW 12/07/2020 2059   APPEARANCEUR CLEAR 12/07/2020 2059   LABSPEC 1.009 12/07/2020 2059   PHURINE 8.0 12/07/2020 2059   GLUCOSEU NEGATIVE 12/07/2020 2059   HGBUR SMALL (A) 12/07/2020 2059   BILIRUBINUR NEGATIVE 12/07/2020 2059   BILIRUBINUR Negative 11/10/2020 0903   KETONESUR 20 (A) 12/07/2020 2059   PROTEINUR NEGATIVE 12/07/2020 2059   UROBILINOGEN negative (A) 11/10/2020 0903   NITRITE NEGATIVE 12/07/2020 2059   LEUKOCYTESUR NEGATIVE 12/07/2020 2059   Sepsis Labs Invalid input(s): PROCALCITONIN,  WBC,  LACTICIDVEN Microbiology Recent Results (from the past 240 hour(s))  Blood  Culture (routine x 2)     Status: None   Collection Time: 12/02/20  8:50 PM   Specimen: BLOOD LEFT HAND  Result Value Ref Range Status   Specimen Description BLOOD LEFT HAND  Final   Special Requests   Final    BOTTLES DRAWN AEROBIC AND ANAEROBIC Blood Culture adequate volume   Culture   Final    NO GROWTH 5 DAYS Performed at [facility], [address], [place], [place] [address]    Report Status 12/07/2020 FINAL  Final  Blood Culture (routine x 2)     Status: None   Collection Time: 12/02/20  9:15 PM   Specimen: BLOOD RIGHT WRIST  Result Value Ref Range Status   Specimen Description BLOOD RIGHT WRIST  Final   Special Requests   Final    BOTTLES DRAWN AEROBIC AND ANAEROBIC Blood Culture adequate volume   Culture   Final    NO GROWTH 5 DAYS Performed at [facility], [address], [place], [place] [address]    Report Status 12/07/2020 FINAL  Final  Resp Panel by RT-PCR (Flu A&B, Covid) Nasopharyngeal Swab     Status: None   Collection Time: 12/02/20 11:09 PM   Specimen: Nasopharyngeal Swab; Nasopharyngeal(NP) swabs in vial transport medium  Result Value Ref Range Status   SARS Coronavirus 2 by RT PCR NEGATIVE NEGATIVE Final    Comment: (NOTE) SARS-CoV-2 target nucleic acids are NOT DETECTED.  The SARS-CoV-2 RNA is generally detectable in upper respiratory specimens during the acute phase of infection. The lowest concentration of SARS-CoV-2 viral copies this assay can detect is 138 copies/mL. A negative result does not preclude SARS-Cov-2 infection and should not be used as the sole basis for treatment or other patient management decisions. A negative result may occur with  improper specimen collection/handling, submission of specimen other than nasopharyngeal swab, presence of viral mutation(s) within the areas targeted by this assay, and inadequate number of viral copies(<138 copies/mL). A negative result must be combined with clinical observations, patient  history, and epidemiological information. The expected result is Negative.  Fact Sheet for Patients:  BloggerCourse.com  Fact Sheet for Healthcare Providers:  SeriousBroker.it  This test is no t yet approved or cleared by the [place] FDA and  has been authorized for detection and/or diagnosis of SARS-CoV-2 by FDA under an Emergency Use Authorization (EUA). This EUA will remain  in effect (meaning this test can be used) for the duration of the COVID-19 declaration under Section 564(b)(1) of the Act, 21 U.S.C.section 360bbb-3(b)(1), unless the authorization is terminated  or revoked sooner.       Influenza A by PCR NEGATIVE NEGATIVE Final   Influenza B by PCR NEGATIVE NEGATIVE Final    Comment: (NOTE) The Xpert Xpress SARS-CoV-2/FLU/RSV plus assay is intended as an aid in the diagnosis of influenza from Nasopharyngeal swab specimens and should not be used as a sole basis for treatment. Nasal washings and aspirates are unacceptable for Xpert Xpress SARS-CoV-2/FLU/RSV testing.  Fact Sheet for Patients: BloggerCourse.com  Fact Sheet for Healthcare Providers: SeriousBroker.it  This test is not yet approved or cleared by the [place] FDA and has been authorized for detection and/or diagnosis of SARS-CoV-2 by FDA under an Emergency Use Authorization (EUA). This EUA will remain in effect (meaning this test can be used) for the duration of the COVID-19 declaration under Section 564(b)(1) of the Act, 21 U.S.C. section 360bbb-3(b)(1), unless the authorization is terminated or revoked.  Performed at [facility], [address], [place], [place] [address]   Urine culture     Status: None   Collection Time: 12/03/20 12:17 AM   Specimen: In/Out Cath Urine  Result Value  Ref Range Status   Specimen Description IN/OUT CATH URINE  Final   Special Requests NONE  Final    Culture   Final    NO GROWTH Performed at [facility], [address] [place], [place] [address]    Report Status 12/04/2020 FINAL  Final  MRSA Next Gen by PCR, Nasal     Status: None   Collection Time: 12/03/20  2:18 AM  Result Value Ref Range Status   MRSA by PCR Next Gen NOT DETECTED NOT DETECTED Final    Comment: (NOTE) The GeneXpert MRSA Assay (FDA approved for NASAL specimens only), is one component of a comprehensive MRSA colonization surveillance program. It is not intended to diagnose MRSA infection nor to guide or monitor treatment for MRSA infections. Test performance is not FDA approved in patients less than 75 years old. Performed at [facility], [address], [place], [place] [address]      Time coordinating discharge: 25 minutes    The [place] controlled substances registry was reviewed for this patient      30 Day Unplanned Readmission Risk Score    Flowsheet Row ED to Hosp-Admission (Current) from 12/02/2020 in [facility]  30 Day Unplanned Readmission Risk Score (%) 13.43 Filed at 12/11/2020 1200       This score is the patient's risk of an unplanned readmission within 30 days of being discharged (0 -100%). The score is based on dignosis, age, lab data, medications, orders, and past utilization.   Low:  0-14.9   Medium: 15-21.9   High: 22-29.9   Extreme: 30 and above             SIGNED:   Alberteen Sam, MD  [facility] 12/11/2020, 12:24 PM

## 2020-12-11 NOTE — Progress Notes (Signed)
Patient would appreciate follow up appointment with a new primary provider. She would like appointment to be with Dr. Quitman Livings with [facility] in [place], [place]. She then states she will seek recommendation for a geriatrician.

## 2020-12-11 NOTE — Progress Notes (Signed)
Marinell Blight to be D/C'd Home per MD order.  Discussed with the patient and all questions fully answered.  VSS, Skin clean, dry and intact without evidence of skin break down, no evidence of skin tears noted. IV catheter discontinued intact. Site without signs and symptoms of complications. Dressing and pressure applied.  An After Visit Summary was printed and given to the patient.  D/c education completed with patient/family including follow up instructions, medication list, d/c activities limitations if indicated, with other d/c instructions as indicated by MD - patient able to verbalize understanding, all questions fully answered.   Patient instructed to return to ED, call 911, or call MD for any changes in condition.   Patient escorted via WC, and D/C home via private auto.  Velva Harman 12/11/2020 1:06 PM

## 2020-12-11 NOTE — Care Management Important Message (Signed)
Important Message  Patient Details  Name: Angela Ramirez MRN: [id number] Date of Birth: 12-Aug-1945   Medicare Important Message Given:  Yes - Important Message mailed due to current National Emergency   Verbal consent obtained due to current National Emergency  Relationship to patient: Child Contact Name: Bufford Lope Call Date: 12/08/20  Time: 1145 Phone: [phone] Outcome: No Answer/Busy Important Message mailed to: Patient address on file    Orson Aloe 12/11/2020, 3:09 PM

## 2020-12-11 NOTE — Progress Notes (Signed)
PT Cancellation Note  Patient Details Name: Angela Ramirez MRN: [id number] DOB: [birth year]   Cancelled Treatment:    Reason Eval/Treat Not Completed: Other (comment) per RN patient has already discharged. Thank you for the opportunity to participate in her care!   Madelaine Etienne, DPT, PN1   Supplemental Physical Therapist [facility]    Pager [phone] Acute Rehab Office [phone]

## 2020-12-18 DIAGNOSIS — H40013 Open angle with borderline findings, low risk, bilateral: Secondary | ICD-10-CM | POA: Diagnosis not present

## 2020-12-18 DIAGNOSIS — I10 Essential (primary) hypertension: Secondary | ICD-10-CM | POA: Diagnosis not present

## 2020-12-18 DIAGNOSIS — Z961 Presence of intraocular lens: Secondary | ICD-10-CM | POA: Diagnosis not present

## 2020-12-18 DIAGNOSIS — H4312 Vitreous hemorrhage, left eye: Secondary | ICD-10-CM | POA: Diagnosis not present

## 2020-12-18 NOTE — Progress Notes (Addendum)
[facility] - Clinic Note  12/20/2020     CHIEF COMPLAINT Patient presents for Retina Evaluation   HISTORY OF PRESENT ILLNESS: Angela Ramirez is a 75 y.o. female who presents to the clinic today for:   HPI     Retina Evaluation   In left eye.  Duration of 1 week.  Context:  distance vision, mid-range vision and near vision.  I, the attending physician,  performed the HPI with the patient and updated documentation appropriately.        Comments   Retina eval per Dr. [name] for Vit Heme OS.  Pt went to the hospital 12/09/2020 with a fever of 104.6.  She was sepsis because of diverticulitis.  She was put on a vent for 4-5 days.  Once it was removed she could not see out of her left eye 12/13/2020.  Everything was dark and something flowing like a river in her eye.  She is now seeing through a cloud and able to see light.       Last edited by Rennis Chris, MD on 12/20/2020 12:22 PM.    pt is here on the referral of Dr. [name] for concern of VH OS, pt states she was hospitalized on 06.18.22 for diverticulitis that become septic, she states she was put on a ventilator in ICU and when she came off the vent on 06.22.22 she could not see out of her left eye, she states gradually she could see lights and shapes, she states yesterday she was able to see a small portion of the TV, but it didn't last long, pt states she takes half a blood pressure pill at night, but no other medication, she thinks her BP was low while she was in the hospital, pt had cat sx with DR. Stonecipher and states everything went well, she denies fol, but describes her vision as a "river flowing", pt saw Dr. [name] who told her she had a bleed in her eye that might clear on its own, pt has been sleeping with her head elevated   Referring physician: Tyrone Schimke, MD No address on file  HISTORICAL INFORMATION:   Selected notes from the MEDICAL RECORD NUMBER Originally referred by Dr.  [name] for retina clearance for cat sx. Re-referred on 6.28.22 for new onset VH OS   CURRENT MEDICATIONS: No current outpatient medications on file. (Ophthalmic Drugs)   No current facility-administered medications for this visit. (Ophthalmic Drugs)   Current Outpatient Medications (Other)  Medication Sig   Acetaminophen-Caffeine 500-65 MG TABS Take 1 tablet by mouth every 6 (six) hours as needed (headache). Tension relief   acidophilus (RISAQUAD) CAPS capsule Take 1 capsule by mouth 3 (three) times daily for 10 days.   amLODipine (NORVASC) 10 MG tablet Take 1 tablet (10 mg total) by mouth daily.   Ginger, Zingiber officinalis, (GINGER ROOT PO) Take 1 tablet by mouth daily as needed (immune system boost).   linaclotide (LINZESS) 72 MCG capsule TAKE 1 CAPSULE(72 MCG) BY MOUTH DAILY BEFORE BREAKFAST   ondansetron (ZOFRAN) 4 MG tablet Take 1 tablet (4 mg total) by mouth every 8 (eight) hours as needed for nausea or vomiting.   ibuprofen (ADVIL) 800 MG tablet Take 1 tablet (800 mg total) by mouth 3 (three) times daily. (Patient not taking: Reported on 12/20/2020)   No current facility-administered medications for this visit. (Other)      REVIEW OF SYSTEMS: ROS   Positive for: Gastrointestinal, Genitourinary, Musculoskeletal, Eyes, Psychiatric Negative for: Constitutional,  Neurological, Skin, HENT, Endocrine, Cardiovascular, Respiratory, Allergic/Imm, Heme/Lymph Last edited by Joni Reining, COA on 12/20/2020  8:11 AM.       ALLERGIES Allergies  Allergen Reactions   Crab (Diagnostic) Hives   Morphine And Related Nausea And Vomiting   Sulfa Antibiotics Nausea And Vomiting    PAST MEDICAL HISTORY Past Medical History:  Diagnosis Date   Anxiety    Arnold-Chiari malformation (HCC)    Arthritis    Cataract    Mixed form OU   Cirrhosis (HCC)    Colon polyps    Depression    Diverticulitis    Diverticulosis    Headache    Hepatitis C    Hypertension    IBS (irritable  bowel syndrome)    UTI (urinary tract infection)    Past Surgical History:  Procedure Laterality Date   BRAIN SURGERY  2003   decompression   BREAST EXCISIONAL BIOPSY Bilateral    age 27's   DILATION AND CURETTAGE OF UTERUS     OVARIAN CYST REMOVAL Bilateral    REDUCTION MAMMAPLASTY Bilateral    25+ years ago    FAMILY HISTORY Family History  Problem Relation Age of Onset   Arthritis Mother    Hypertension Mother    Kidney disease Mother    Diabetes Mother    Alcohol abuse Father    Arthritis Father    Lung cancer Father    Hypertension Sister    Diabetes Maternal Grandmother    Breast cancer Paternal Grandmother    Parkinson's disease Son    Hypertension Daughter    Diabetes Maternal Aunt    Kidney failure Maternal Aunt    Diabetes Maternal Uncle    Rheum arthritis Sister    Breast cancer Paternal Aunt    Breast cancer Paternal Aunt     SOCIAL HISTORY Social History   Tobacco Use   Smoking status: Former    Pack years: 0.00    Types: Cigarettes    Quit date: 09/29/1969    Years since quitting: 51.2   Smokeless tobacco: Never  Vaping Use   Vaping Use: Never used  Substance Use Topics   Alcohol use: Yes    Alcohol/week: 0.0 standard drinks    Comment: 2 glasses of wine a week   Drug use: No         OPHTHALMIC EXAM:  Base Eye Exam     Visual Acuity (Snellen - Linear)       Right Left   Dist [place] 20/30 HM temporally   Dist ph [place] NI          Tonometry (Tonopen, 8:22 AM)       Right Left   Pressure 11 12         Pupils       Dark Light Shape React APD   Right 4 3 Round Brisk None   Left 4 3 Round Brisk None  OS appears to have hippus instead of APD        Visual Fields (Counting fingers)       Left Right    Full Full         Extraocular Movement       Right Left    Full Full         Neuro/Psych     Oriented x3: Yes   Mood/Affect: Normal         Dilation     Both eyes: 1.0% Mydriacyl, 2.5% Phenylephrine @ 8:22  AM  Slit Lamp and Fundus Exam     Slit Lamp Exam       Right Left   Lids/Lashes Dermatochalasis - upper lid, mild Meibomian gland dysfunction Dermatochalasis - upper lid, mild Meibomian gland dysfunction   Conjunctiva/Sclera White and quiet White and quiet   Cornea arcus, trace Punctate epithelial erosions, well healed temporal cataract wounds, trace tear film debris mild arcus, trace Punctate epithelial erosions, well healed cataract wound   Anterior Chamber deep, narrow temporal angle deep, narrow temporal angle   Iris Round and dilated Round and dilated   Lens Toric PC IOL in good position with marks at 0530 and 1130 PC IOL in good position   Vitreous Vitreous syneresis, Posterior vitreous detachment, vitreous condensations Vitreous syneresis, Posterior vitreous detachment, vitreous condensations, diffuse RBC's         Fundus Exam       Right Left   Disc Tilted disc, Pink and Sharp, +cupping, +PPA/PPP No view   C/D Ratio 0.75 0.4   Macula Flat, Blunted foveal reflex, RPE mottling and clumping, No heme or edema No view   Vessels attenuated, mild tortuousity, mild AV crossing changes attenuated   Periphery Attached, peripheral cystoid degeneration most prominent ST periphery; no RT/RD, blonde fundus Hazy view; red reflex           Refraction     Manifest Refraction       Sphere Cylinder Dist VA   Right -0.50 Sphere 20/20-   Left plano   NI            IMAGING AND PROCEDURES  Imaging and Procedures for 12/20/2020  OCT, Retina - OU - Both Eyes       Right Eye Quality was good. Central Foveal Thickness: 251. Progression has been stable. Findings include normal foveal contour, no IRF, no SRF, myopic contour.   Left Eye Quality was poor. Progression has worsened. Findings include (No readable image obtained).   Notes *Images captured and stored on drive  Diagnosis / Impression:  OD: NFP, no IRF/SRF; Myopic contour OS: no readable image  obtained  Clinical management:  See below  Abbreviations: NFP - Normal foveal profile. CME - cystoid macular edema. PED - pigment epithelial detachment. IRF - intraretinal fluid. SRF - subretinal fluid. EZ - ellipsoid zone. ERM - epiretinal membrane. ORA - outer retinal atrophy. ORT - outer retinal tubulation. SRHM - subretinal hyper-reflective material. IRHM - intraretinal hyper-reflective material      B-Scan Ultrasound - OS - Left Eye       Quality was good. Findings included vitreous opacities, vitreous hemorrhage.   Notes **Images stored on drive**  Impression: OS: vitreous opacities consistent with hemorrhage; no obvious RT/RD or mass      Intravitreal Injection, Pharmacologic Agent - OS - Left Eye       Time Out 12/20/2020. 9:12 AM. Confirmed correct patient, procedure, site, and patient consented.   Anesthesia Topical anesthesia was used. Anesthetic medications included Lidocaine 2%, Proparacaine 0.5%.   Procedure Preparation included 5% betadine to ocular surface, eyelid speculum. A supplied needle was used.   Injection: 1.25 mg Bevacizumab 1.25mg /0.91ml   Route: Intravitreal, Site: Left Eye   NDC: [id number], Lot: [id number] , Expiration date: 01/31/2021, Waste: 0 mL   Post-op Post injection exam found visual acuity of at least counting fingers. The patient tolerated the procedure well. There were no complications. The patient received written and verbal post procedure care education.  ASSESSMENT/PLAN:    ICD-10-CM   1. Vitreous hemorrhage of left eye (HCC)  H43.12 B-Scan Ultrasound - OS - Left Eye    Intravitreal Injection, Pharmacologic Agent - OS - Left Eye    Bevacizumab (AVASTIN) SOLN 1.25 mg    2. 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 Severe myopia of both eyes  H52.13     4. Retinal edema  H35.81 OCT, Retina - OU - Both Eyes    5. Posterior vitreous detachment of both eyes  H43.813      6. Essential hypertension  I10     7. Hypertensive retinopathy of both eyes  H35.033     8. Pseudophakia, both eyes  Z96.1      Vitreous Hemorrhage OS - pt hospitalized on 06.18.22 for diverticulitis that became sepsis - vision loss first noted ~06.22.22 after coming off vent in ICU - b-scan 6.29.22 shows vitreous opacities consistent w/ VH, no obvious RT/RD or mass OS - [facility] OS [facility] pt wishes to proceed - RBA of procedure discussed, questions answered - IVA informed consent obtained and signed, 06.29.22 (OS) - see procedure note - VH precautions reviewed -- minimize activities, keep head elevated, avoid ASA/NSAIDs/blood thinners as able - f/u early next week  2-4. 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 PVD / vitreous syneresis OU  - asymptomatic  - Discussed findings and prognosis  - No RT or RD on 360 peripheral exam  - Reviewed s/s of RT/RD  - Strict return precautions for any such RT/RD signs/symptoms  6,7. Hypertensive retinopathy OU - discussed importance of tight BP control - monitor  8. Pseudophakia OU  - s/p CE/IOL OU (Dr. Delaney MeigsStonecipher)  - IOL in good position, doing well - monitor  Ophthalmic Meds Ordered this visit:  Meds ordered this encounter  Medications   Bevacizumab (AVASTIN) SOLN 1.25 mg       Return for f/u early next week, VH OS, DFE, OCT.  There are no Patient Instructions on file for this visit.  Explained the diagnoses, plan, and follow up with the patient and they expressed understanding.  Patient expressed understanding of the importance of proper follow up care.   This document serves as a record of services personally performed by  Karie ChimeraBrian G. Anant Agard, MD, PhD. It was created on their behalf by Glee ArvinAmanda J. Manson PasseyBrown, OA an ophthalmic technician. The creation of this record is the provider's dictation and/or activities during the visit.    Electronically signed by: Glee ArvinAmanda J. Kristopher OppenheimBrown, OA 06.29.2022 12:31 PM   Karie ChimeraBrian G. Aliana Kreischer, M.D., Ph.D. Diseases & Surgery of the Retina and Vitreous [facility]  I have reviewed the above documentation for accuracy and completeness, and I agree with the above. Karie ChimeraBrian G. Rilan Eiland, M.D., Ph.D. 12/20/20 12:31 PM   Abbreviations: M myopia (nearsighted); A astigmatism; H hyperopia (farsighted); P presbyopia; Mrx spectacle prescription;  CTL contact lenses; OD right eye; OS left eye; OU both eyes  XT exotropia; ET esotropia; PEK punctate epithelial keratitis; PEE punctate epithelial erosions; DES dry eye syndrome; MGD meibomian gland dysfunction; ATs  artificial tears; PFAT's preservative free artificial tears; NSC nuclear sclerotic cataract; PSC posterior subcapsular cataract; ERM epi-retinal membrane; PVD posterior vitreous detachment; RD retinal detachment; DM diabetes mellitus; DR diabetic retinopathy; NPDR non-proliferative diabetic retinopathy; PDR proliferative diabetic retinopathy; CSME clinically significant macular edema; DME diabetic macular edema; dbh dot blot hemorrhages; CWS cotton wool spot; POAG primary open angle glaucoma; C/D cup-to-disc ratio; HVF humphrey visual field; GVF goldmann visual field; OCT optical coherence tomography; IOP intraocular pressure; BRVO Branch retinal vein occlusion; CRVO central retinal vein occlusion; CRAO central retinal artery occlusion; BRAO branch retinal artery occlusion; RT retinal tear; SB scleral buckle; PPV pars plana vitrectomy; VH Vitreous hemorrhage; PRP panretinal laser photocoagulation; IVK intravitreal kenalog; VMT vitreomacular traction; MH Macular hole;  NVD neovascularization of the disc; NVE neovascularization elsewhere; AREDS age  related eye disease study; ARMD age related macular degeneration; POAG primary open angle glaucoma; EBMD epithelial/anterior basement membrane dystrophy; ACIOL anterior chamber intraocular lens; IOL intraocular lens; PCIOL posterior chamber intraocular lens; Phaco/IOL phacoemulsification with intraocular lens placement; PRK photorefractive keratectomy; LASIK laser assisted in situ keratomileusis; HTN hypertension; DM diabetes mellitus; COPD chronic obstructive pulmonary disease

## 2020-12-20 ENCOUNTER — Encounter (INDEPENDENT_AMBULATORY_CARE_PROVIDER_SITE_OTHER): Payer: Self-pay | Admitting: Ophthalmology

## 2020-12-20 ENCOUNTER — Other Ambulatory Visit: Payer: Self-pay

## 2020-12-20 ENCOUNTER — Ambulatory Visit (INDEPENDENT_AMBULATORY_CARE_PROVIDER_SITE_OTHER): Payer: PPO | Admitting: Ophthalmology

## 2020-12-20 DIAGNOSIS — H3581 Retinal edema: Secondary | ICD-10-CM

## 2020-12-20 DIAGNOSIS — H43813 Vitreous degeneration, bilateral: Secondary | ICD-10-CM | POA: Diagnosis not present

## 2020-12-20 DIAGNOSIS — H4312 Vitreous hemorrhage, left eye: Secondary | ICD-10-CM | POA: Diagnosis not present

## 2020-12-20 DIAGNOSIS — H442E3 Degenerative myopia with other maculopathy, bilateral eye: Secondary | ICD-10-CM | POA: Diagnosis not present

## 2020-12-20 DIAGNOSIS — H35033 Hypertensive retinopathy, bilateral: Secondary | ICD-10-CM

## 2020-12-20 DIAGNOSIS — I10 Essential (primary) hypertension: Secondary | ICD-10-CM | POA: Diagnosis not present

## 2020-12-20 DIAGNOSIS — H5213 Myopia, bilateral: Secondary | ICD-10-CM

## 2020-12-20 DIAGNOSIS — Z961 Presence of intraocular lens: Secondary | ICD-10-CM

## 2020-12-20 MED ORDER — BEVACIZUMAB CHEMO INJECTION 1.25MG/0.05ML SYRINGE FOR KALEIDOSCOPE
1.2500 mg | INTRAVITREAL | Status: AC | PRN
  Administered 2020-12-20: 1.25 mg via INTRAVITREAL

## 2020-12-20 NOTE — Progress Notes (Signed)
[facility] Note  12/26/2020     CHIEF COMPLAINT Patient presents for Retina Follow Up   HISTORY OF PRESENT ILLNESS: Angela Ramirez is a 75 y.o. female who presents to the clinic today for:  HPI     Retina Follow Up   Patient presents with  Other.  In left eye.  This started weeks ago.  Severity is moderate.  Duration of weeks.  Since onset it is stable.  I, the attending physician,  performed the HPI with the patient and updated documentation appropriately.        Comments   Pt states her vision is much better in her left eye since Tx last week.  Pt states vision is the same OD.  Pt denies eye pain OD.  Had some discomfort OS after injection and took excedrin for relief.      Last edited by Bernarda Caffey, MD on 12/28/2020  8:14 AM.    Pt states vision started to improve the day after she was last seen and has been getting better every day since, she is sleeping with her head elevated    Referring physician: Vevelyn Royals, MD No address on file  HISTORICAL INFORMATION:   Selected notes from the [facility] Originally referred by Dr. Lucita Ferrara for retina clearance for cat sx. Re-referred on 6.28.22 for new onset VH OS   CURRENT MEDICATIONS: No current outpatient medications on file. (Ophthalmic Drugs)   No current facility-administered medications for this visit. (Ophthalmic Drugs)   Current Outpatient Medications (Other)  Medication Sig   Acetaminophen-Caffeine 500-65 MG TABS Take 1 tablet by mouth every 6 (six) hours as needed (headache). Tension relief   amLODipine (NORVASC) 10 MG tablet Take 1 tablet (10 mg total) by mouth daily.   Ginger, Zingiber officinalis, (GINGER ROOT PO) Take 1 tablet by mouth daily as needed (immune system boost).   ibuprofen (ADVIL) 800 MG tablet Take 1 tablet (800 mg total) by mouth 3 (three) times daily. (Patient not taking: Reported on 12/20/2020)   linaclotide (LINZESS) 72 MCG capsule TAKE 1  CAPSULE(72 MCG) BY MOUTH DAILY BEFORE BREAKFAST   ondansetron (ZOFRAN) 4 MG tablet Take 1 tablet (4 mg total) by mouth every 8 (eight) hours as needed for nausea or vomiting.   No current facility-administered medications for this visit. (Other)      REVIEW OF SYSTEMS: ROS   Positive for: Gastrointestinal, Genitourinary, Musculoskeletal, Eyes, Psychiatric Negative for: Constitutional, Neurological, Skin, HENT, Endocrine, Cardiovascular, Respiratory, Allergic/Imm, Heme/Lymph Last edited by Doneen Poisson on 12/26/2020  7:49 AM.        ALLERGIES Allergies  Allergen Reactions   Crab (Diagnostic) Hives   Morphine And Related Nausea And Vomiting   Sulfa Antibiotics Nausea And Vomiting    PAST MEDICAL HISTORY Past Medical History:  Diagnosis Date   Anxiety    Arnold-Chiari malformation (HCC)    Arthritis    Cataract    Mixed form OU   Cirrhosis ([place])    Colon polyps    Depression    Diverticulitis    Diverticulosis    Headache    Hepatitis C    Hypertension    IBS (irritable bowel syndrome)    UTI (urinary tract infection)    Past Surgical History:  Procedure Laterality Date   BRAIN SURGERY  2003   decompression   BREAST EXCISIONAL BIOPSY Bilateral    age 77's   DILATION AND CURETTAGE OF UTERUS     OVARIAN  CYST REMOVAL Bilateral    REDUCTION MAMMAPLASTY Bilateral    25+ years ago    FAMILY HISTORY Family History  Problem Relation Age of Onset   Arthritis Mother    Hypertension Mother    Kidney disease Mother    Diabetes Mother    Alcohol abuse Father    Arthritis Father    Lung cancer Father    Hypertension Sister    Diabetes Maternal Grandmother    Breast cancer Paternal Grandmother    Parkinson's disease Son    Hypertension Daughter    Diabetes Maternal Aunt    Kidney failure Maternal Aunt    Diabetes Maternal Uncle    Rheum arthritis Sister    Breast cancer Paternal Aunt    Breast cancer Paternal Aunt     SOCIAL HISTORY Social History    Tobacco Use   Smoking status: Former    Pack years: 0.00    Types: Cigarettes    Quit date: 09/29/1969    Years since quitting: 51.2   Smokeless tobacco: Never  Vaping Use   Vaping Use: Never used  Substance Use Topics   Alcohol use: Yes    Alcohol/week: 0.0 standard drinks    Comment: 2 glasses of wine a week   Drug use: No         OPHTHALMIC EXAM:  Base Eye Exam     Visual Acuity (Snellen - Linear)       Right Left   Dist [place] 20/30 -2 20/80   Dist ph [place] 20/25 -1 20/50 +2    Correction: Glasses         Tonometry (Tonopen, 8:04 AM)       Right Left   Pressure 16 16         Pupils       Dark Light Shape React APD   Right 3 2 Round Brisk 0   Left 3 2 Round Brisk 0         Visual Fields       Left Right    Full    Restrictions  Partial outer superior temporal deficiency         Extraocular Movement       Right Left    Full Full         Neuro/Psych     Oriented x3: Yes   Mood/Affect: Normal         Dilation     Both eyes: 1.0% Mydriacyl, 2.5% Phenylephrine @ 8:04 AM           Slit Lamp and Fundus Exam     Slit Lamp Exam       Right Left   Lids/Lashes Dermatochalasis - upper lid, mild Meibomian gland dysfunction Dermatochalasis - upper lid, mild Meibomian gland dysfunction   Conjunctiva/Sclera White and quiet White and quiet   Cornea arcus, 1+Punctate epithelial erosions, well healed temporal cataract wounds mild arcus, 1+ inferior Punctate epithelial erosions   Anterior Chamber deep, narrow temporal angle deep, narrow temporal angle   Iris Round and dilated Round and dilated   Lens PC IOL in good position PC IOL in good position   Vitreous Vitreous syneresis, Posterior vitreous detachment, vitreous condensations Vitreous syneresis, Posterior vitreous detachment, vitreous condensations, blood stained vitreous condensations clearing centrally and settling inferiorly         Fundus Exam       Right Left   Disc Tilted  disc, Pink and Sharp, +cupping, +PPA/PPP Compact, mild Pallor, severe  tilt, 360 PPA -- broad, large inter retinal and pre-retinal heme nasal to disc -- CNV   C/D Ratio 0.75 0.4   Macula Flat, Blunted foveal reflex, RPE mottling and clumping, No heme or edema posterior staphyloma/CR atrophy inferior macula, Blunted foveal reflex, RPE mottling, clumping and atrophy, No heme, +myopic degeneration   Vessels attenuated, mild tortuousity, mild AV crossing changes attenuated   Periphery Attached, peripheral cystoid degeneration most prominent ST periphery; no RT/RD, blonde fundus Attached; no RT/RD; peripheral cystoid degen, paving stone degeneration inferiorly            IMAGING AND PROCEDURES  Imaging and Procedures for 12/26/2020  OCT, Retina - OU - Both Eyes       Right Eye Quality was good. Central Foveal Thickness: 249. Progression has been stable. Findings include normal foveal contour, no IRF, no SRF, myopic contour.   Left Eye Quality was poor. Central Foveal Thickness: 325. Progression has improved. Findings include myopic contour, intraretinal fluid, outer retinal atrophy (Interval improvement in vitreous opacities; stable IRF/cystic changes nasal macula).   Notes *Images captured and stored on drive  Diagnosis / Impression:  OD: NFP, no IRF/SRF; Myopic contour OS: interval improvement in vitreous opacities; stable cystic changes nasal mac  Clinical management:  See below  Abbreviations: NFP - Normal foveal profile. CME - cystoid macular edema. PED - pigment epithelial detachment. IRF - intraretinal fluid. SRF - subretinal fluid. EZ - ellipsoid zone. ERM - epiretinal membrane. ORA - outer retinal atrophy. ORT - outer retinal tubulation. SRHM - subretinal hyper-reflective material. IRHM - intraretinal hyper-reflective material                ASSESSMENT/PLAN:    ICD-10-CM   1. Vitreous hemorrhage of left eye (HCC)  H43.12     2. 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 Severe myopia of both eyes  H52.13     4. Retinal edema  H35.81 OCT, Retina - OU - Both Eyes    5. Posterior vitreous detachment of both eyes  H43.813     6. Essential hypertension  I10     7. Hypertensive retinopathy of both eyes  H35.033     8. Pseudophakia, both eyes  Z96.1       Vitreous Hemorrhage OS - pt hospitalized on 06.18.22 for diverticulitis that became sepsis - vision loss first noted ~06.22.22 after coming off vent in ICU - b-scan 6.29.22 shows vitreous opacities consistent w/ VH, no obvious RT/RD or mass OS - s/p IVA OS #1 (06.29.22) for VH - today, VH improving  - BCVA 20/50 from HM last week - OCT shows interval improvement in vitreous opacities - IVA informed consent obtained and signed, 06.29.22 (OS) - VH precautions reviewed -- minimize activities, keep head elevated, avoid ASA/NSAIDs/blood thinners as able - f/u 1 week, DFE, OCT  2-4. 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 PVD / vitreous syneresis OU  - asymptomatic  - Discussed findings and prognosis  - No RT or RD on 360 peripheral exam  - Reviewed s/s of RT/RD  - Strict return precautions for any such RT/RD signs/symptoms  6,7. Hypertensive retinopathy OU - discussed importance of tight BP control - monitor  8. Pseudophakia OU  - s/p CE/IOL OU (Dr. Lucita Ferrara)  - IOLs in good position, doing well - monitor  Ophthalmic Meds Ordered this visit:  No orders of the defined types were placed in this encounter.      Return in about 1 week (around 01/02/2021) for f/u VH OS, DFE, OCT.  There are no Patient Instructions on file for this visit.  Explained the diagnoses, plan, and  follow up with the patient and they expressed understanding.  Patient expressed understanding of the importance of proper follow up care.   This document serves as a record of services personally performed by Gardiner Sleeper, MD, PhD. It was created on their behalf by Estill Bakes, COT an ophthalmic technician. The creation of this record is the provider's dictation and/or activities during the visit.    Electronically signed by: Estill Bakes, COT 6.29.22 @ 8:19 AM   This document serves as a record of services personally performed by Gardiner Sleeper, MD, PhD. It was created on their behalf by San Jetty. Owens Shark, OA an ophthalmic technician. The creation of this record is the provider's dictation and/or activities during the visit.    Electronically signed by: San Jetty. Owens Shark, [place] 07.05.2022 8:19 AM  Gardiner Sleeper, M.D., Ph.D. Diseases & Surgery of the Retina and Vitreous [facility]  I have reviewed the above documentation for accuracy and completeness, and I agree with the above. Gardiner Sleeper, M.D., Ph.D. 12/28/20 8:19 AM   Abbreviations: M myopia (nearsighted); A astigmatism; H hyperopia (farsighted); P presbyopia; Mrx spectacle prescription;  CTL contact lenses; OD right eye; OS left eye; OU both eyes  XT exotropia; ET esotropia; PEK punctate epithelial keratitis; PEE punctate epithelial erosions; DES dry eye syndrome; MGD meibomian gland dysfunction; ATs artificial tears; PFAT's preservative free artificial tears; [place] nuclear sclerotic cataract; PSC posterior subcapsular cataract; ERM epi-retinal membrane; PVD posterior vitreous detachment; RD retinal detachment; DM diabetes mellitus; DR diabetic retinopathy; NPDR non-proliferative diabetic retinopathy; PDR proliferative diabetic retinopathy; CSME clinically significant macular edema; DME diabetic macular edema; dbh dot blot hemorrhages; CWS cotton wool spot; POAG primary open angle glaucoma; C/D cup-to-disc ratio;  HVF humphrey visual field; GVF goldmann visual field; OCT optical coherence tomography; IOP intraocular pressure; BRVO Branch retinal vein occlusion; CRVO central retinal vein occlusion; CRAO central retinal artery occlusion; BRAO branch retinal artery occlusion; RT retinal tear; SB scleral buckle; PPV pars plana vitrectomy; VH Vitreous hemorrhage; PRP panretinal laser photocoagulation; IVK intravitreal kenalog; VMT vitreomacular traction; MH Macular hole;  NVD neovascularization of the disc; NVE neovascularization elsewhere; AREDS age related eye disease study; ARMD age related macular degeneration; POAG primary open angle glaucoma; EBMD epithelial/anterior basement membrane dystrophy; ACIOL anterior chamber intraocular lens; IOL intraocular lens; PCIOL posterior chamber intraocular lens; Phaco/IOL phacoemulsification with intraocular lens placement; [place] photorefractive keratectomy; LASIK laser assisted in situ keratomileusis; HTN hypertension; DM diabetes mellitus; COPD chronic obstructive pulmonary disease

## 2020-12-26 ENCOUNTER — Ambulatory Visit (INDEPENDENT_AMBULATORY_CARE_PROVIDER_SITE_OTHER): Payer: PPO | Admitting: Ophthalmology

## 2020-12-26 ENCOUNTER — Other Ambulatory Visit: Payer: Self-pay

## 2020-12-26 DIAGNOSIS — I10 Essential (primary) hypertension: Secondary | ICD-10-CM | POA: Diagnosis not present

## 2020-12-26 DIAGNOSIS — Z961 Presence of intraocular lens: Secondary | ICD-10-CM

## 2020-12-26 DIAGNOSIS — H442E3 Degenerative myopia with other maculopathy, bilateral eye: Secondary | ICD-10-CM

## 2020-12-26 DIAGNOSIS — H4312 Vitreous hemorrhage, left eye: Secondary | ICD-10-CM

## 2020-12-26 DIAGNOSIS — H5213 Myopia, bilateral: Secondary | ICD-10-CM

## 2020-12-26 DIAGNOSIS — H3581 Retinal edema: Secondary | ICD-10-CM | POA: Diagnosis not present

## 2020-12-26 DIAGNOSIS — H43813 Vitreous degeneration, bilateral: Secondary | ICD-10-CM

## 2020-12-26 DIAGNOSIS — H35033 Hypertensive retinopathy, bilateral: Secondary | ICD-10-CM | POA: Diagnosis not present

## 2020-12-28 ENCOUNTER — Encounter (INDEPENDENT_AMBULATORY_CARE_PROVIDER_SITE_OTHER): Payer: Self-pay | Admitting: Ophthalmology

## 2020-12-28 ENCOUNTER — Telehealth: Payer: Self-pay | Admitting: Internal Medicine

## 2020-12-28 NOTE — Telephone Encounter (Signed)
Patient called and said she was recently at the ED and is requesting to only see Dr. Rhea Belton because she doe snot know the APP's qualifications. Is currently scheduled with Doug Sou PA-C she is requesting to speak with a nurse.

## 2020-12-28 NOTE — Telephone Encounter (Signed)
Pt states she is sorry she did not mean to sound the way she did when she called. Reports she will see the PA, she states she is still just very nervous after having sepsis and being in the ICU. Pt will keep appt.

## 2021-01-01 NOTE — Progress Notes (Signed)
[facility] - Clinic Note  01/03/2021     CHIEF COMPLAINT Patient presents for Retina Follow Up   HISTORY OF PRESENT ILLNESS: Angela Ramirez is a 75 y.o. female who presents to the clinic today for:  HPI     Retina Follow Up   Patient presents with  Other.  In left eye.  This started weeks ago.  Severity is moderate.  Duration of 1 week.  Since onset it is rapidly improving.  I, the attending physician,  performed the HPI with the patient and updated documentation appropriately.        Comments   75 y/o female pt here for 1 wk f/u for VH OS.  VA OS improved.  Still has the "snowglobe" effect of floaters OS from time to time, but it comes and goes.  No change in [place] OD.  Denies pain, FOL.  Refresh prn OU.      Last edited by Rennis Chris, MD on 01/07/2021  8:16 PM.     Pt states vision is improving, she still sees floaters superiorly, she is sleeping with her head elevated   Referring physician: Myrlene Broker, MD [address] [place],  [place] [address]  HISTORICAL INFORMATION:   Selected notes from the MEDICAL RECORD NUMBER Originally referred by Dr. [name] for retina clearance for cat sx. Re-referred on 6.28.22 for new onset VH OS   CURRENT MEDICATIONS: No current outpatient medications on file. (Ophthalmic Drugs)   No current facility-administered medications for this visit. (Ophthalmic Drugs)   Current Outpatient Medications (Other)  Medication Sig   Acetaminophen-Caffeine 500-65 MG TABS Take 1 tablet by mouth every 6 (six) hours as needed (headache). Tension relief   amLODipine (NORVASC) 10 MG tablet Take 1 tablet (10 mg total) by mouth daily. (Patient taking differently: Take 5 mg by mouth every evening.)   linaclotide (LINZESS) 72 MCG capsule TAKE 1 CAPSULE(72 MCG) BY MOUTH DAILY BEFORE BREAKFAST   ondansetron (ZOFRAN) 4 MG tablet Take 1 tablet (4 mg total) by mouth every 8 (eight) hours as needed for nausea or vomiting.    polyethylene glycol powder (GLYCOLAX/MIRALAX) 17 GM/SCOOP powder Dissolve 1 capful in 8 ounces of water and drink as needed for constipation   temazepam (RESTORIL) 15 MG capsule Take 1 capsule (15 mg total) by mouth 2 (two) times daily as needed for sleep (anxiety).   traMADol (ULTRAM) 50 MG tablet Take 1 tablet (50 mg total) by mouth every 8 (eight) hours as needed for up to 5 days.   No current facility-administered medications for this visit. (Other)      REVIEW OF SYSTEMS: ROS   Positive for: Gastrointestinal, Eyes, Psychiatric Negative for: Constitutional, Neurological, Skin, Genitourinary, Musculoskeletal, HENT, Endocrine, Cardiovascular, Respiratory, Allergic/Imm, Heme/Lymph Last edited by Celine Mans, COA on 01/03/2021  8:18 AM.         ALLERGIES Allergies  Allergen Reactions   Crab (Diagnostic) Hives   Morphine And Related Nausea And Vomiting   Sulfa Antibiotics Nausea And Vomiting    PAST MEDICAL HISTORY Past Medical History:  Diagnosis Date   Anxiety    Arnold-Chiari malformation (HCC)    Arthritis    Cirrhosis (HCC)    Colon polyps    Depression    Diverticulitis    Diverticulosis    Headache    Hepatitis C    Hypertension    Hypertensive retinopathy    IBS (irritable bowel syndrome)    UTI (urinary tract infection)  Past Surgical History:  Procedure Laterality Date   BRAIN SURGERY  2003   decompression   BREAST EXCISIONAL BIOPSY Bilateral    age 75's   CATARACT EXTRACTION     DILATION AND CURETTAGE OF UTERUS     EYE SURGERY     OVARIAN CYST REMOVAL Bilateral    REDUCTION MAMMAPLASTY Bilateral    25+ years ago    FAMILY HISTORY Family History  Problem Relation Age of Onset   Arthritis Mother    Hypertension Mother    Kidney disease Mother    Diabetes Mother    Alcohol abuse Father    Arthritis Father    Lung cancer Father    Hypertension Sister    Diabetes Maternal Grandmother    Breast cancer Paternal Grandmother     Parkinson's disease Son    Hypertension Daughter    Diabetes Maternal Aunt    Kidney failure Maternal Aunt    Diabetes Maternal Uncle    Rheum arthritis Sister    Breast cancer Paternal Aunt    Breast cancer Paternal Aunt     SOCIAL HISTORY Social History   Tobacco Use   Smoking status: Former    Types: Cigarettes    Quit date: 09/29/1969    Years since quitting: 51.3   Smokeless tobacco: Never  Vaping Use   Vaping Use: Never used  Substance Use Topics   Alcohol use: Yes    Alcohol/week: 0.0 standard drinks    Comment: 2 glasses of wine a week   Drug use: No         OPHTHALMIC EXAM:  Base Eye Exam     Visual Acuity (Snellen - Linear)       Right Left   Dist [place] 20/30 20/40 -2   Dist ph [place] 20/25 - 20/25 -2         Tonometry (Tonopen, 8:20 AM)       Right Left   Pressure 14 13         Pupils       Dark Light Shape React APD   Right 3 2 Round Brisk None   Left 3 2 Round Brisk None         Visual Fields (Counting fingers)       Left Right    Full Full         Extraocular Movement       Right Left    Full, Ortho Full, Ortho         Neuro/Psych     Oriented x3: Yes   Mood/Affect: Normal         Dilation     Both eyes: 1.0% Mydriacyl, 2.5% Phenylephrine @ 8:20 AM           Slit Lamp and Fundus Exam     Slit Lamp Exam       Right Left   Lids/Lashes Dermatochalasis - upper lid, mild Meibomian gland dysfunction Dermatochalasis - upper lid, mild Meibomian gland dysfunction   Conjunctiva/Sclera White and quiet White and quiet   Cornea arcus, 1+Punctate epithelial erosions, well healed temporal cataract wounds mild arcus, 1+ inferior Punctate epithelial erosions   Anterior Chamber deep, narrow temporal angle deep, narrow temporal angle   Iris Round and dilated Round and dilated   Lens PC IOL in good position PC IOL in good position   Vitreous Vitreous syneresis, Posterior vitreous detachment, vitreous condensations Vitreous  syneresis, Posterior vitreous detachment, vitreous condensations, blood stained vitreous condensations clearing centrally  and settling inferiorly         Fundus Exam       Right Left   Disc Tilted disc, Pink and Sharp, +cupping, +PPA/PPP Compact, mild Pallor, severe tilt, 360 PPA -- broad, large inter retinal and pre-retinal heme nasal to disc -- CNV   C/D Ratio 0.75 0.4   Macula Flat, Blunted foveal reflex, RPE mottling and clumping, No heme or edema posterior staphyloma/CR atrophy inferior macula, Blunted foveal reflex, RPE mottling, clumping and atrophy, No heme, +myopic degeneration   Vessels attenuated, mild tortuousity, mild AV crossing changes attenuated   Periphery Attached, peripheral cystoid degeneration most prominent ST periphery; no RT/RD, blonde fundus Attached; no RT/RD; peripheral cystoid degen, paving stone degeneration inferiorly            IMAGING AND PROCEDURES  Imaging and Procedures for 01/03/2021  OCT, Retina - OU - Both Eyes       Right Eye Quality was good. Central Foveal Thickness: 258. Progression has been stable. Findings include normal foveal contour, no IRF, no SRF, myopic contour.   Left Eye Quality was poor. Central Foveal Thickness: 378. Progression has improved. Findings include myopic contour, intraretinal fluid, outer retinal atrophy (Interval improvement in vitreous opacities; persistent cystic changes nasal macula).   Notes *Images captured and stored on drive  Diagnosis / Impression:  OD: NFP, no IRF/SRF; Myopic contour OS: Interval improvement in vitreous opacities; persistent cystic changes nasal macula  Clinical management:  See below  Abbreviations: NFP - Normal foveal profile. CME - cystoid macular edema. PED - pigment epithelial detachment. IRF - intraretinal fluid. SRF - subretinal fluid. EZ - ellipsoid zone. ERM - epiretinal membrane. ORA - outer retinal atrophy. ORT - outer retinal tubulation. SRHM - subretinal hyper-reflective  material. IRHM - intraretinal hyper-reflective material                 ASSESSMENT/PLAN:    ICD-10-CM   1. Vitreous hemorrhage of left eye (HCC)  H43.12     2. 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 Severe myopia of both eyes  H52.13     4. Retinal edema  H35.81 OCT, Retina - OU - Both Eyes    5. Posterior vitreous detachment of both eyes  H43.813     6. Essential hypertension  I10     7. Hypertensive retinopathy of both eyes  H35.033     8. Pseudophakia, both eyes  Z96.1      Vitreous Hemorrhage OS - pt hospitalized on 06.18.22 for diverticulitis that became sepsis - vision loss first noted ~06.22.22 after coming off vent in ICU - b-scan 6.29.22 shows vitreous opacities consistent w/ VH, no obvious RT/RD or mass OS - s/p IVA OS #1 (06.29.22) for VH - today, VH improving  - BCVA 20/25 from 20/50 last week - OCT shows interval improvement in vitreous opacities - IVA informed consent obtained and signed, 06.29.22 (OS) - VH precautions reviewed -- minimize activities, keep head elevated, avoid ASA/NSAIDs/blood thinners as able - f/u 2 weeks, DFE, OCT  2-4. Myopic degeneration OU (OS>OD)  - OS with posterior staphyloma / CR atrophy inf macula  - no CNV OU  - OCT OS shows severe myopic contour with posterior staphyloma and CR atrophy, mild ERM, stable cystic changes nasal to staphyloma, no SRF  - no RT/RD on peripheral exam  - monitor  - f/u in 1 year DFE, OCT  5. PVD / vitreous syneresis OU  -  asymptomatic  - Discussed findings and prognosis  - No RT or RD on 360 peripheral exam  - Reviewed s/s of RT/RD  - Strict return precautions for any such RT/RD symptoms  6,7. Hypertensive retinopathy OU - discussed importance of tight BP control - monitor  8. Pseudophakia OU  - s/p CE/IOL OU (Dr. [name])  - IOLs in good position, doing well - monitor  Ophthalmic Meds Ordered this visit:  No orders of the defined  types were placed in this encounter.      Return in about 2 weeks (around 01/17/2021) for f/u VH OS, DFE, OCT.  There are no Patient Instructions on file for this visit.  Explained the diagnoses, plan, and follow up with the patient and they expressed understanding.  Patient expressed understanding of the importance of proper follow up care.   This document serves as a record of services personally performed by Karie Chimera, MD, PhD. It was created on their behalf by Cristopher Estimable, COT an ophthalmic technician. The creation of this record is the provider's dictation and/or activities during the visit.    Electronically signed by: Cristopher Estimable, COT 6.29.22 @ 8:21 PM   This document serves as a record of services personally performed by Karie Chimera, MD, PhD. It was created on their behalf by Glee Arvin. Manson Passey, OA an ophthalmic technician. The creation of this record is the provider's dictation and/or activities during the visit.    Electronically signed by: Glee Arvin. Manson Passey, [place] 07.13.2022 8:21 PM  Karie Chimera, M.D., Ph.D. Diseases & Surgery of the Retina and Vitreous [facility]  I have reviewed the above documentation for accuracy and completeness, and I agree with the above. Karie Chimera, M.D., Ph.D. 01/07/21 8:21 PM   Abbreviations: M myopia (nearsighted); A astigmatism; H hyperopia (farsighted); P presbyopia; Mrx spectacle prescription;  CTL contact lenses; OD right eye; OS left eye; OU both eyes  XT exotropia; ET esotropia; PEK punctate epithelial keratitis; PEE punctate epithelial erosions; DES dry eye syndrome; MGD meibomian gland dysfunction; ATs artificial tears; PFAT's preservative free artificial tears; NSC nuclear sclerotic cataract; PSC posterior subcapsular cataract; ERM epi-retinal membrane; PVD posterior vitreous detachment; RD retinal detachment; DM diabetes mellitus; DR diabetic retinopathy; NPDR non-proliferative diabetic retinopathy; PDR  proliferative diabetic retinopathy; CSME clinically significant macular edema; DME diabetic macular edema; dbh dot blot hemorrhages; CWS cotton wool spot; POAG primary open angle glaucoma; C/D cup-to-disc ratio; HVF humphrey visual field; GVF goldmann visual field; OCT optical coherence tomography; IOP intraocular pressure; BRVO Branch retinal vein occlusion; CRVO central retinal vein occlusion; CRAO central retinal artery occlusion; BRAO branch retinal artery occlusion; RT retinal tear; SB scleral buckle; PPV pars plana vitrectomy; VH Vitreous hemorrhage; PRP panretinal laser photocoagulation; IVK intravitreal kenalog; VMT vitreomacular traction; MH Macular hole;  NVD neovascularization of the disc; NVE neovascularization elsewhere; AREDS age related eye disease study; ARMD age related macular degeneration; POAG primary open angle glaucoma; EBMD epithelial/anterior basement membrane dystrophy; ACIOL anterior chamber intraocular lens; IOL intraocular lens; PCIOL posterior chamber intraocular lens; Phaco/IOL phacoemulsification with intraocular lens placement; PRK photorefractive keratectomy; LASIK laser assisted in situ keratomileusis; HTN hypertension; DM diabetes mellitus; COPD chronic obstructive pulmonary disease

## 2021-01-02 ENCOUNTER — Ambulatory Visit (INDEPENDENT_AMBULATORY_CARE_PROVIDER_SITE_OTHER): Payer: PPO | Admitting: Physician Assistant

## 2021-01-02 ENCOUNTER — Encounter: Payer: Self-pay | Admitting: Physician Assistant

## 2021-01-02 ENCOUNTER — Inpatient Hospital Stay: Admission: RE | Admit: 2021-01-02 | Payer: PPO | Source: Ambulatory Visit

## 2021-01-02 ENCOUNTER — Other Ambulatory Visit (INDEPENDENT_AMBULATORY_CARE_PROVIDER_SITE_OTHER): Payer: PPO

## 2021-01-02 VITALS — BP 160/78 | HR 92 | Ht 66.5 in | Wt 187.6 lb

## 2021-01-02 DIAGNOSIS — Z8719 Personal history of other diseases of the digestive system: Secondary | ICD-10-CM

## 2021-01-02 DIAGNOSIS — K588 Other irritable bowel syndrome: Secondary | ICD-10-CM

## 2021-01-02 DIAGNOSIS — K7469 Other cirrhosis of liver: Secondary | ICD-10-CM | POA: Diagnosis not present

## 2021-01-02 DIAGNOSIS — Z8619 Personal history of other infectious and parasitic diseases: Secondary | ICD-10-CM

## 2021-01-02 LAB — CBC WITH DIFFERENTIAL/PLATELET
Basophils Absolute: 0.1 10*3/uL (ref 0.0–0.1)
Basophils Relative: 1.5 % (ref 0.0–3.0)
Eosinophils Absolute: 0.1 10*3/uL (ref 0.0–0.7)
Eosinophils Relative: 1.6 % (ref 0.0–5.0)
HCT: 37.6 % (ref 36.0–46.0)
Hemoglobin: 12.6 g/dL (ref 12.0–15.0)
Lymphocytes Relative: 26.5 % (ref 12.0–46.0)
Lymphs Abs: 2.2 10*3/uL (ref 0.7–4.0)
MCHC: 33.5 g/dL (ref 30.0–36.0)
MCV: 85.9 fl (ref 78.0–100.0)
Monocytes Absolute: 0.4 10*3/uL (ref 0.1–1.0)
Monocytes Relative: 4.6 % (ref 3.0–12.0)
Neutro Abs: 5.5 10*3/uL (ref 1.4–7.7)
Neutrophils Relative %: 65.8 % (ref 43.0–77.0)
Platelets: 379 10*3/uL (ref 150.0–400.0)
RBC: 4.37 Mil/uL (ref 3.87–5.11)
RDW: 17.7 % — ABNORMAL HIGH (ref 11.5–15.5)
WBC: 8.4 10*3/uL (ref 4.0–10.5)

## 2021-01-02 LAB — COMPREHENSIVE METABOLIC PANEL
ALT: 16 U/L (ref 0–35)
AST: 25 U/L (ref 0–37)
Albumin: 4.4 g/dL (ref 3.5–5.2)
Alkaline Phosphatase: 132 U/L — ABNORMAL HIGH (ref 39–117)
BUN: 11 mg/dL (ref 6–23)
CO2: 28 mEq/L (ref 19–32)
Calcium: 10.3 mg/dL (ref 8.4–10.5)
Chloride: 99 mEq/L (ref 96–112)
Creatinine, Ser: 0.78 mg/dL (ref 0.40–1.20)
GFR: 75.78 mL/min (ref >60.00)
Glucose, Bld: 108 mg/dL — ABNORMAL HIGH (ref 70–99)
Potassium: 3.8 mEq/L (ref 3.5–5.1)
Sodium: 138 mEq/L (ref 135–145)
Total Bilirubin: 0.4 mg/dL (ref 0.2–1.2)
Total Protein: 9 g/dL — ABNORMAL HIGH (ref 6.0–8.3)

## 2021-01-02 LAB — PROTIME-INR
INR: 1 ratio (ref 0.8–1.0)
Prothrombin Time: 11.3 s (ref 9.6–13.1)

## 2021-01-02 MED ORDER — POLYETHYLENE GLYCOL 3350 17 GM/SCOOP PO POWD: ORAL | 3 refills | Status: DC

## 2021-01-02 NOTE — Patient Instructions (Addendum)
It was my pleasure to provide care to you today. Based on our discussion, I am providing you with my recommendations below:  RECOMMENDATION(S):   Continue Linzess 72mg  daily  Please try to consume 60 ounces of water daily  Please add Miralax 17 grams (1 capful) dissolved in 8 ounces of water as needed for constipation  I am recommending that you call Dr. office to make a follow up appointment ASAP  Please call back this week if you experience any increased abdominal discomfort  LABS:   Please proceed to the basement level for lab work before leaving today. Press "B" on the elevator. The lab is located at the first door on the left as you exit the elevator.  HEALTHCARE LAWS AND MY CHART RESULTS:   Due to recent changes in healthcare laws, you may see results of your imaging and/or laboratory studies on MyChart before I have had a chance to review them.  I understand that in some cases there may be results that are confusing or concerning to you. Please understand that not all results are received at same time and often I may need to interpret multiple results in order to provide you with the best plan of care or course of treatment. Therefore, I ask that you please give me 48 hours to thoroughly review all your results before contacting my office for clarification.   FOLLOW UP:  I would like for you to follow up with Dr. Frutoso Chase on 04/02/21 @ 2:30pm. Please call the office at [phone] to reschedule this appointment if you are unable to attend.  BMI:  If you are age 51 or older, your body mass index should be between 23-30. Your Body mass index is 29.83 kg/m. If this is out of the aforementioned range listed, please consider follow up with your Primary Care Provider.  MY CHART:  The [facility] providers would like to encourage you to use [facility] to communicate with providers for non-urgent requests or questions.  Due to long hold times on the telephone, sending your  provider a message by [facility] may be a faster and more efficient way to get a response.  Please allow 48 business hours for a response.  Please remember that this is for non-urgent requests.   Thank you for trusting me with your gastrointestinal care!    Amy Esterwood, PA-C

## 2021-01-02 NOTE — Progress Notes (Signed)
Subjective:    Patient ID: Angela Ramirez, female    DOB: January 23, 1946, 75 y.o.   MRN: [id number]  HPI Angela Ramirez is a pleasant 75 year old female, known to Dr. Hilarie Fredrickson, who was last seen in our office in May 2021 when she had undergone EGD and colonoscopy.  She has history of compensated cirrhosis secondary to hepatitis C (status posttreatment with SVR), constipation, IBS, history of colon polyps, diverticulosis and hypertension. She comes in today for follow-up after recent hospitalization 12/03/2020 through 12/11/2020 at which time she had sepsis secondary to acute diverticulitis complicated by acute hypoxic respiratory failure, acute metabolic encephalopathy and acute kidney injury.  She was initially cared for by the critical care team, then hospitalist service and was not seen by GI during her admission.  She was intubated for about 24 hours and extubated on 12/04/2020. CT of the abdomen pelvis on 12/03/2020 showed a slightly lobular liver consistent with cirrhosis, no hepatic lesion, she has extensive diverticulosis with subtle focus of peridiverticular inflammation in the proximal sigmoid suggesting early or developing diverticulitis, no extraluminal gas or abscess.  She was treated with IV Unasyn, then completed a course of oral antibiotics prior to discharge. Blood cultures were negative, UA on admission negative. Last labs 12/11/2020 WBC 7.0/hemoglobin 9.9/hematocrit 31.6/platelets 436 She did have elevated LFTs at admission felt secondary to sepsis, normalization of LFTs by discharge with T bili 0.7 alk phos 257/AST 37/ALT 4 4.  She has not been seen by PCP since discharge. Overall she feels she has made significant improvement since discharge from the hospital.  She says she was quite weak after discharge and has been staying with her daughter.  She has been able to increase her activity and ambulation and is now able to walk without a walker.  She says she still fatigues easily.  Initially she had  no appetite but that has improved as well. She admits to being very anxious and alarmed by how quickly she became so sick.  She is asking what she should watch for in the future. Prior to hospitalization she had not been feeling well for for 5 days, had been seen by her PCP and had been started on an antibiotic for probable UTI.  She says she started feeling fatigued and then developed profound fatigue, had some mild chills but no fever, no appetite but no vomiting.  She also had a sense that she had some vague discomfort in her abdomen but never had any significant abdominal pain, so had no indication that she may have diverticulitis.. Does not believe that she has had diverticulitis in the past.  She did have colonoscopy 10/2019 for history of polyps and was noted to have multiple sigmoid diverticuli and internal hemorrhoids, no recurrent polyps EGD done May 2021 showed no evidence of esophageal varices, there was some antral edema noted, possible early portal changes.  Plan was for follow-up in 2 years.   Review of Systems Pertinent positive and negative review of systems were noted in the above HPI section.  All other review of systems was otherwise negative.   Outpatient Encounter Medications as of 01/02/2021  Medication Sig   Acetaminophen-Caffeine 500-65 MG TABS Take 1 tablet by mouth every 6 (six) hours as needed (headache). Tension relief   amLODipine (NORVASC) 10 MG tablet Take 1 tablet (10 mg total) by mouth daily. (Patient taking differently: Take 5 mg by mouth every evening.)   linaclotide (LINZESS) 72 MCG capsule TAKE 1 CAPSULE(72 MCG) BY MOUTH DAILY BEFORE BREAKFAST  ondansetron (ZOFRAN) 4 MG tablet Take 1 tablet (4 mg total) by mouth every 8 (eight) hours as needed for nausea or vomiting.   polyethylene glycol powder (GLYCOLAX/MIRALAX) 17 GM/SCOOP powder Dissolve 1 capful in 8 ounces of water and drink as needed for constipation   [DISCONTINUED] Ginger, Zingiber officinalis, (GINGER  ROOT PO) Take 1 tablet by mouth daily as needed (immune system boost).   [DISCONTINUED] ibuprofen (ADVIL) 800 MG tablet Take 1 tablet (800 mg total) by mouth 3 (three) times daily.   No facility-administered encounter medications on file as of 01/02/2021.   Allergies  Allergen Reactions   Crab (Diagnostic) Hives   Morphine And Related Nausea And Vomiting   Sulfa Antibiotics Nausea And Vomiting   Patient Active Problem List   Diagnosis Date Noted   Diverticulitis large intestine 12/06/2020   Severe sepsis ([place]) 12/03/2020   Painful urination 11/10/2020   Left foot pain 07/14/2020   Situational anxiety 07/14/2020   Allergic rhinitis 02/11/2020   Dysfunction of Eustachian tube, bilateral 02/11/2020   Vertigo 02/11/2020   Constipation 08/20/2019   GERD (gastroesophageal reflux disease) 02/19/2019   Travel advice encounter 05/07/2018   Finger pain, right 02/20/2018   Low back pain 02/20/2018   Liver fibrosis 10/09/2017   Need for prophylactic vaccination and inoculation against viral hepatitis 10/09/2017   Hep C w/o coma, chronic ([place]) 08/26/2017   Routine general medical examination at a health care facility 08/18/2017   Right shoulder pain 08/18/2017   Essential hypertension 09/21/2015   Arnold-Chiari malformation ([place]) 09/21/2015   Insomnia 09/21/2015   PTSD (post-traumatic stress disorder) 09/21/2015   Social History   Socioeconomic History   Marital status: Widowed    Spouse name: Not on file   Number of children: 3   Years of education: Not on file   Highest education level: Not on file  Occupational History   Occupation: retired  Tobacco Use   Smoking status: Former    Pack years: 0.00    Types: Cigarettes    Quit date: 09/29/1969    Years since quitting: 51.2   Smokeless tobacco: Never  Vaping Use   Vaping Use: Never used  Substance and Sexual Activity   Alcohol use: Yes    Alcohol/week: 0.0 standard drinks    Comment: 2 glasses of wine a week   Drug use: No    Sexual activity: Not Currently  Other Topics Concern   Not on file  Social History Narrative   Not on file   Social Determinants of Health   Financial Resource Strain: Not on file  Food Insecurity: Not on file  Transportation Needs: Not on file  Physical Activity: Not on file  Stress: Not on file  Social Connections: Not on file  Intimate Partner Violence: Not on file    Ms. Belitz's family history includes Alcohol abuse in her father; Arthritis in her father and mother; Breast cancer in her paternal aunt, paternal aunt, and paternal grandmother; Diabetes in her maternal aunt, maternal grandmother, maternal uncle, and mother; Hypertension in her daughter, mother, and sister; Kidney disease in her mother; Kidney failure in her maternal aunt; Lung cancer in her father; Parkinson's disease in her son; Rheum arthritis in her sister.      Objective:    Vitals:   01/02/21 1446  BP: (!) 160/78  Pulse: 92  SpO2: 97%    Physical Exam.Well-developed well-nourished  older WF in no acute distress.  Pleasant Weight, 187 BMI29.8  HEENT; nontraumatic normocephalic, EOMI, PE  R LA, sclera anicteric. Oropharynx;not examined Neck; supple, no JVD Cardiovascular; regular rate and rhythm with S1-S2, no murmur rub or gallop Pulmonary; Clear bilaterally Abdomen; soft, very minimal tenderness LLQ, no guarding  nondistended, no palpable mass or hepatosplenomegaly, bowel sounds are active Rectal; not done today Skin; benign exam, no jaundice rash or appreciable lesions Extremities; no clubbing cyanosis or edema skin warm and dry Neuro/Psych; alert and oriented x4, grossly nonfocal mood and affect appropriate        Assessment & Plan:   #65 75 year old female seen today in post hospital follow-up after recent hospitalization 6/12 through 12/11/2020 at which time she was admitted with sepsis felt secondary to acute diverticulitis, this was associated with hypoxic respiratory failure requiring 24  hours of vent support, metabolic encephalopathy and acute kidney injury all of which had resolved at the time of discharge. She completed a course of IV antibiotics then a few days of Cipro and Flagyl prior to discharge from the hospital.  She is made significant improvement since discharge at which time she felt quite debilitated.  Energy level and stamina improving, appetite improving. She has not had any ongoing abdominal pain, she does have IBS and will have some vague discomfort from that intermittently she says she has had some mild soreness today in her lower abdomen which she describes as a intermittent mild cramp, no ongoing or any constant pain.  #2-IBS/constipation predominant-continues on Linzess 72 mcg daily #3 cirrhosis secondary to hepatitis C-status posttreatment with sustained viral response.  Cirrhosis has been compensated #4 [place] surveillance-up-to-date with EGD last done May 2021, no varices, positive antral edema question early portal changes No liver lesion seen on contrasted CT with this last admission #5 extensive diverticulosis #6 history of adenomatous colon polyps-no recurrent polyps at last colonoscopy May 2021  #7 anemia normocytic  Plan; Long discussion with patient today regarding her recent illness.  We discussed that significant/profound fatigue should be a flag for her in the future.  We also discussed paying attention to milder abdominal pain especially if constant over a few days.  Patient encouraged to pursue emergency room evaluation at any point in the future should she have recurrent reminiscent symptoms. She is asked to call here should she have any symptoms consistent with recurrent diverticulitis.  We will observe this weekend if she has any increase in the very vague discomfort she has today she will call back later in the week and would pursue CT of the abdomen and pelvis. She will continue Linzess 72 mcg daily Add MiraLAX 17 g in 8 ounces of water as  needed if not having good bowel movements with Linzess 60 ounces of water daily. CBC with differential, c-Met, INR, AFP She usually follows a healthy diet and is sensitive to higher amounts of fiber.  She will continue her usual IBS diet, avoid nuts and popcorn. Will schedule for follow-up with Dr. Hilarie Fredrickson in 6 to 8 weeks. Patient was encouraged to call and make a follow-up visit with Dr. Sharlet Salina for review of her recent illness and ongoing follow-up.    Rorie Delmore Genia Harold PA-C 01/02/2021   Cc: Hoyt Koch, *

## 2021-01-03 ENCOUNTER — Other Ambulatory Visit: Payer: Self-pay

## 2021-01-03 ENCOUNTER — Ambulatory Visit (INDEPENDENT_AMBULATORY_CARE_PROVIDER_SITE_OTHER): Payer: PPO | Admitting: Ophthalmology

## 2021-01-03 ENCOUNTER — Encounter (INDEPENDENT_AMBULATORY_CARE_PROVIDER_SITE_OTHER): Payer: Self-pay | Admitting: Ophthalmology

## 2021-01-03 DIAGNOSIS — H5213 Myopia, bilateral: Secondary | ICD-10-CM

## 2021-01-03 DIAGNOSIS — H4312 Vitreous hemorrhage, left eye: Secondary | ICD-10-CM | POA: Diagnosis not present

## 2021-01-03 DIAGNOSIS — H35033 Hypertensive retinopathy, bilateral: Secondary | ICD-10-CM

## 2021-01-03 DIAGNOSIS — Z961 Presence of intraocular lens: Secondary | ICD-10-CM

## 2021-01-03 DIAGNOSIS — H3581 Retinal edema: Secondary | ICD-10-CM

## 2021-01-03 DIAGNOSIS — I10 Essential (primary) hypertension: Secondary | ICD-10-CM | POA: Diagnosis not present

## 2021-01-03 DIAGNOSIS — H442E3 Degenerative myopia with other maculopathy, bilateral eye: Secondary | ICD-10-CM | POA: Diagnosis not present

## 2021-01-03 DIAGNOSIS — H43813 Vitreous degeneration, bilateral: Secondary | ICD-10-CM

## 2021-01-03 LAB — AFP TUMOR MARKER: AFP-Tumor Marker: 2.8 ng/mL

## 2021-01-05 ENCOUNTER — Encounter: Payer: Self-pay | Admitting: Internal Medicine

## 2021-01-05 ENCOUNTER — Other Ambulatory Visit: Payer: Self-pay

## 2021-01-05 ENCOUNTER — Ambulatory Visit (INDEPENDENT_AMBULATORY_CARE_PROVIDER_SITE_OTHER): Payer: PPO | Admitting: Internal Medicine

## 2021-01-05 VITALS — BP 136/80 | HR 102 | Temp 98.4°F | Resp 18 | Ht 66.5 in | Wt 188.2 lb

## 2021-01-05 DIAGNOSIS — F4310 Post-traumatic stress disorder, unspecified: Secondary | ICD-10-CM | POA: Diagnosis not present

## 2021-01-05 DIAGNOSIS — K5732 Diverticulitis of large intestine without perforation or abscess without bleeding: Secondary | ICD-10-CM

## 2021-01-05 DIAGNOSIS — R309 Painful micturition, unspecified: Secondary | ICD-10-CM | POA: Diagnosis not present

## 2021-01-05 DIAGNOSIS — R300 Dysuria: Secondary | ICD-10-CM

## 2021-01-05 DIAGNOSIS — M545 Low back pain, unspecified: Secondary | ICD-10-CM | POA: Diagnosis not present

## 2021-01-05 MED ORDER — TEMAZEPAM 15 MG PO CAPS: 15.0000 mg | ORAL_CAPSULE | Freq: Two times a day (BID) | ORAL | 0 refills | Status: DC | PRN

## 2021-01-05 MED ORDER — TRAMADOL HCL 50 MG PO TABS: 50.0000 mg | ORAL_TABLET | Freq: Three times a day (TID) | ORAL | 0 refills | Status: AC | PRN

## 2021-01-05 NOTE — Assessment & Plan Note (Signed)
Ordered U/A and culture so if symptoms are not improving she will get those done at the lab.

## 2021-01-05 NOTE — Progress Notes (Signed)
   Subjective:   Patient ID: Angela Ramirez, female    DOB: June 28, 1945, 75 y.o.   MRN: [id number]  HPI The patient is a 75 YO female coming in for hospital follow up (in for diverticulitis and vision loss, intubated due to severe sepsis with elevated liver enzymes). She is doing much better than at discharge. Has followed up with eye specialist they are managing a vitreous hemorrhage. She is struggling with some anxiety and sleeplessness since leaving the hospital. Had tried temazepam years ago and is okay with trying that again. She is going to start writing about what happened to help her deal with that. She is having some raspiness with the throat and rare SOB. She is still having some abdominal discomfort. Overall stable. Is having some dysuria and is taking azo not sure if that is helping yet. Denies fevers or chills.  PMH, [facility], social history reviewed and updated  Review of Systems  Constitutional:  Positive for activity change, appetite change and fatigue.  HENT: Negative.         Throat irritation  Eyes: Negative.   Respiratory:  Negative for cough, chest tightness and shortness of breath.   Cardiovascular:  Negative for chest pain, palpitations and leg swelling.  Gastrointestinal:  Positive for abdominal pain. Negative for abdominal distention, constipation, diarrhea, nausea and vomiting.  Genitourinary:  Positive for dysuria.  Musculoskeletal: Negative.   Skin: Negative.   Neurological: Negative.   Psychiatric/Behavioral:  Positive for decreased concentration and sleep disturbance. The patient is nervous/anxious.    Objective:  Physical Exam Constitutional:      Appearance: She is well-developed.  HENT:     Head: Normocephalic and atraumatic.  Cardiovascular:     Rate and Rhythm: Normal rate and regular rhythm.  Pulmonary:     Effort: Pulmonary effort is normal. No respiratory distress.     Breath sounds: Normal breath sounds. No wheezing or rales.  Abdominal:     General:  Bowel sounds are normal. There is no distension.     Palpations: Abdomen is soft.     Tenderness: There is no abdominal tenderness. There is no rebound.  Musculoskeletal:     Cervical back: Normal range of motion.  Skin:    General: Skin is warm and dry.  Neurological:     Mental Status: She is alert and oriented to person, place, and time.     Coordination: Coordination normal.  Psychiatric:     Comments: More jittery than normal, speech content normal    Vitals:   01/05/21 1046  BP: 136/80  Pulse: (!) 102  Resp: 18  Temp: 98.4 F (36.9 C)  TempSrc: Oral  SpO2: 95%  Weight: 188 lb 3.2 oz (85.4 kg)  Height: 5' 6.5" (1.689 m)    This visit occurred during the SARS-CoV-2 public health emergency.  Safety protocols were in place, including screening questions prior to the visit, additional usage of staff PPE, and extensive cleaning of exam room while observing appropriate contact time as indicated for disinfecting solutions.   Assessment & Plan:

## 2021-01-05 NOTE — Assessment & Plan Note (Signed)
Overall appears to be resolving. Her appetite and diet are not back to normal. Her severe sepsis is resolved. Denies blood in stool. Recent follow up and labs with GI improved.

## 2021-01-05 NOTE — Assessment & Plan Note (Signed)
We talked about how this recent critical illness can cause PTSD and worsening sleep and anxiety problems. Rx temazepam for sleep or anxiety prn short term.

## 2021-01-05 NOTE — Patient Instructions (Addendum)
We have sent in temazepam to use during the day for anxiety and night time for sleep.   Let us know if this doesn't work well.  We have refilled the tramadol as well.

## 2021-01-05 NOTE — Assessment & Plan Note (Signed)
Takes rare tramadol. Refilled today. She uses judiciously. [place] narcotic database reviewed.

## 2021-01-07 ENCOUNTER — Encounter (INDEPENDENT_AMBULATORY_CARE_PROVIDER_SITE_OTHER): Payer: Self-pay | Admitting: Ophthalmology

## 2021-01-09 DIAGNOSIS — R531 Weakness: Secondary | ICD-10-CM | POA: Diagnosis not present

## 2021-01-09 DIAGNOSIS — Q0700 Arnold-Chiari syndrome without spina bifida or hydrocephalus: Secondary | ICD-10-CM | POA: Diagnosis not present

## 2021-01-09 DIAGNOSIS — K5732 Diverticulitis of large intestine without perforation or abscess without bleeding: Secondary | ICD-10-CM | POA: Diagnosis not present

## 2021-01-09 DIAGNOSIS — A419 Sepsis, unspecified organism: Secondary | ICD-10-CM | POA: Diagnosis not present

## 2021-01-10 DIAGNOSIS — A419 Sepsis, unspecified organism: Secondary | ICD-10-CM | POA: Diagnosis not present

## 2021-01-10 DIAGNOSIS — R531 Weakness: Secondary | ICD-10-CM | POA: Diagnosis not present

## 2021-01-10 DIAGNOSIS — Q0700 Arnold-Chiari syndrome without spina bifida or hydrocephalus: Secondary | ICD-10-CM | POA: Diagnosis not present

## 2021-01-10 DIAGNOSIS — K5732 Diverticulitis of large intestine without perforation or abscess without bleeding: Secondary | ICD-10-CM | POA: Diagnosis not present

## 2021-01-17 ENCOUNTER — Ambulatory Visit (INDEPENDENT_AMBULATORY_CARE_PROVIDER_SITE_OTHER): Payer: PPO | Admitting: Ophthalmology

## 2021-01-17 ENCOUNTER — Encounter (INDEPENDENT_AMBULATORY_CARE_PROVIDER_SITE_OTHER): Payer: Self-pay | Admitting: Ophthalmology

## 2021-01-17 ENCOUNTER — Other Ambulatory Visit: Payer: Self-pay

## 2021-01-17 ENCOUNTER — Encounter (INDEPENDENT_AMBULATORY_CARE_PROVIDER_SITE_OTHER): Payer: PPO | Admitting: Ophthalmology

## 2021-01-17 DIAGNOSIS — H3581 Retinal edema: Secondary | ICD-10-CM

## 2021-01-17 DIAGNOSIS — H5213 Myopia, bilateral: Secondary | ICD-10-CM | POA: Diagnosis not present

## 2021-01-17 DIAGNOSIS — H43813 Vitreous degeneration, bilateral: Secondary | ICD-10-CM | POA: Diagnosis not present

## 2021-01-17 DIAGNOSIS — I10 Essential (primary) hypertension: Secondary | ICD-10-CM

## 2021-01-17 DIAGNOSIS — H4312 Vitreous hemorrhage, left eye: Secondary | ICD-10-CM | POA: Diagnosis not present

## 2021-01-17 DIAGNOSIS — H25813 Combined forms of age-related cataract, bilateral: Secondary | ICD-10-CM

## 2021-01-17 DIAGNOSIS — Z961 Presence of intraocular lens: Secondary | ICD-10-CM | POA: Diagnosis not present

## 2021-01-17 DIAGNOSIS — H442E3 Degenerative myopia with other maculopathy, bilateral eye: Secondary | ICD-10-CM

## 2021-01-17 DIAGNOSIS — H35033 Hypertensive retinopathy, bilateral: Secondary | ICD-10-CM | POA: Diagnosis not present

## 2021-01-17 NOTE — Progress Notes (Addendum)
[facility] - Clinic Note  01/17/2021     CHIEF COMPLAINT Patient presents for Retina Follow Up   HISTORY OF PRESENT ILLNESS: Angela Ramirez is a 75 y.o. female who presents to the clinic today for:  HPI     Retina Follow Up   Patient presents with  Other.  In left eye.  Duration of 2 weeks.  Since onset it is stable.  I, the attending physician,  performed the HPI with the patient and updated documentation appropriately.        Comments   2 week follow up VH OS- OS will get foggy but if she sits still the vision will clear up, states patient. OS has gradually improved.  The size of images and clarity has improved.      Last edited by Rennis Chris, MD on 01/18/2021 10:55 PM.       Referring physician: Myrlene Broker, MD [address] [place],  [place] [address]  HISTORICAL INFORMATION:   Selected notes from the MEDICAL RECORD NUMBER Originally referred by Dr. [name] for retina clearance for cat sx. Re-referred on 6.28.22 for new onset VH OS   CURRENT MEDICATIONS: No current outpatient medications on file. (Ophthalmic Drugs)   No current facility-administered medications for this visit. (Ophthalmic Drugs)   Current Outpatient Medications (Other)  Medication Sig   Acetaminophen-Caffeine 500-65 MG TABS Take 1 tablet by mouth every 6 (six) hours as needed (headache). Tension relief   amLODipine (NORVASC) 10 MG tablet Take 1 tablet (10 mg total) by mouth daily. (Patient taking differently: Take 5 mg by mouth every evening.)   hydrochlorothiazide (HYDRODIURIL) 25 MG tablet Take 25 mg by mouth daily.   linaclotide (LINZESS) 72 MCG capsule TAKE 1 CAPSULE(72 MCG) BY MOUTH DAILY BEFORE BREAKFAST   polyethylene glycol powder (GLYCOLAX/MIRALAX) 17 GM/SCOOP powder Dissolve 1 capful in 8 ounces of water and drink as needed for constipation   temazepam (RESTORIL) 15 MG capsule Take 1 capsule (15 mg total) by mouth 2 (two) times daily as needed for  sleep (anxiety).   No current facility-administered medications for this visit. (Other)      REVIEW OF SYSTEMS: ROS   Positive for: Gastrointestinal, Eyes, Psychiatric Negative for: Constitutional, Neurological, Skin, Genitourinary, Musculoskeletal, HENT, Endocrine, Cardiovascular, Respiratory, Allergic/Imm, Heme/Lymph Last edited by Joni Reining, COA on 01/17/2021  8:00 AM.         ALLERGIES Allergies  Allergen Reactions   Crab (Diagnostic) Hives   Morphine And Related Nausea And Vomiting   Sulfa Antibiotics Nausea And Vomiting    PAST MEDICAL HISTORY Past Medical History:  Diagnosis Date   Anxiety    Arnold-Chiari malformation (HCC)    Arthritis    Cirrhosis (HCC)    Colon polyps    Depression    Diverticulitis    Diverticulosis    Headache    Hepatitis C    Hypertension    Hypertensive retinopathy    IBS (irritable bowel syndrome)    UTI (urinary tract infection)    Past Surgical History:  Procedure Laterality Date   BRAIN SURGERY  2003   decompression   BREAST EXCISIONAL BIOPSY Bilateral    age 5's   CATARACT EXTRACTION     DILATION AND CURETTAGE OF UTERUS     EYE SURGERY     OVARIAN CYST REMOVAL Bilateral    REDUCTION MAMMAPLASTY Bilateral    25+ years ago    FAMILY HISTORY Family History  Problem Relation Age of Onset  Arthritis Mother    Hypertension Mother    Kidney disease Mother    Diabetes Mother    Alcohol abuse Father    Arthritis Father    Lung cancer Father    Hypertension Sister    Diabetes Maternal Grandmother    Breast cancer Paternal Grandmother    Parkinson's disease Son    Hypertension Daughter    Diabetes Maternal Aunt    Kidney failure Maternal Aunt    Diabetes Maternal Uncle    Rheum arthritis Sister    Breast cancer Paternal Aunt    Breast cancer Paternal Aunt     SOCIAL HISTORY Social History   Tobacco Use   Smoking status: Former    Types: Cigarettes    Quit date: 09/29/1969    Years since quitting:  51.3   Smokeless tobacco: Never  Vaping Use   Vaping Use: Never used  Substance Use Topics   Alcohol use: Yes    Alcohol/week: 0.0 standard drinks    Comment: 2 glasses of wine a week   Drug use: No         OPHTHALMIC EXAM:  Base Eye Exam     Visual Acuity (Snellen - Linear)       Right Left   Dist [place] 20/25 +2 20/40 -2   Dist ph [place] 20/25 +1 20/25 +1         Tonometry (Tonopen, 8:09 AM)       Right Left   Pressure 16 17         Pupils       Dark Light Shape React APD   Right 4 3 Round Brisk None   Left 4 3 Round Brisk None         Visual Fields (Counting fingers)       Left Right    Full Full         Extraocular Movement       Right Left    Full Full         Neuro/Psych     Oriented x3: Yes   Mood/Affect: Normal         Dilation     Both eyes: 1.0% Mydriacyl, 2.5% Phenylephrine @ 8:08 AM           Slit Lamp and Fundus Exam     Slit Lamp Exam       Right Left   Lids/Lashes Dermatochalasis - upper lid, mild Meibomian gland dysfunction Dermatochalasis - upper lid, mild Meibomian gland dysfunction   Conjunctiva/Sclera White and quiet White and quiet   Cornea arcus, 1+Punctate epithelial erosions, well healed temporal cataract wounds mild arcus, 1+ inferior Punctate epithelial erosions   Anterior Chamber deep, narrow temporal angle deep, narrow temporal angle   Iris Round and dilated Round and dilated   Lens PC IOL in good position PC IOL in good position   Vitreous Vitreous syneresis, Posterior vitreous detachment, vitreous condensations Vitreous syneresis, Posterior vitreous detachment +RBC, vitreous condensations, blood stained vitreous condensations clearing centrally and settling inferiorly--turning white         Fundus Exam       Right Left   Disc Tilted disc, Pink and Sharp, +cupping, +PPA/PPP Compact, mild Pallor, severe tilt, 360 PPA -- broad, large inter retinal and pre-retinal heme nasal to disc -- CNV   C/D Ratio  0.75 0.4   Macula Flat, Blunted foveal reflex, RPE mottling and clumping, No heme or edema posterior staphyloma/CR atrophy inferior macula, Blunted foveal reflex,  RPE mottling, clumping and atrophy, No heme, +myopic degeneration   Vessels attenuated, mild tortuousity, mild AV crossing changes attenuated   Periphery Attached, peripheral cystoid degeneration most prominent ST periphery; no RT/RD, blonde fundus Attached; no RT/RD; peripheral cystoid degen, paving stone degeneration inferiorly            IMAGING AND PROCEDURES  Imaging and Procedures for 01/17/2021  OCT, Retina - OU - Both Eyes       Right Eye Quality was good. Central Foveal Thickness: 253. Progression has been stable. Findings include normal foveal contour, no IRF, no SRF, myopic contour.   Left Eye Quality was good. Central Foveal Thickness: 317. Progression has improved. Findings include myopic contour, intraretinal fluid, outer retinal atrophy (Interval improvement in vitreous opacities; persistent cystic changes nasal macula).   Notes *Images captured and stored on drive  Diagnosis / Impression:  OD: NFP, no IRF/SRF; Myopic contour OS: Interval improvement in vitreous opacities; persistent cystic changes nasal macula  Clinical management:  See below  Abbreviations: NFP - Normal foveal profile. CME - cystoid macular edema. PED - pigment epithelial detachment. IRF - intraretinal fluid. SRF - subretinal fluid. EZ - ellipsoid zone. ERM - epiretinal membrane. ORA - outer retinal atrophy. ORT - outer retinal tubulation. SRHM - subretinal hyper-reflective material. IRHM - intraretinal hyper-reflective material      Intravitreal Injection, Pharmacologic Agent - OS - Left Eye       Time Out 01/17/2021. 8:53 AM. Confirmed correct patient, procedure, site, and patient consented.   Anesthesia Topical anesthesia was used. Anesthetic medications included Lidocaine 2%, Proparacaine 0.5%.   Procedure Preparation  included 5% betadine to ocular surface, eyelid speculum.   Injection: 1.25 mg Bevacizumab 1.25mg /0.38ml   Route: Intravitreal, Site: Left Eye   NDC: [id number], Lot: [id number], Expiration date: 02/19/2021, Waste: 0.05 mL   Post-op Post injection exam found visual acuity of at least counting fingers. The patient tolerated the procedure well. There were no complications. The patient received written and verbal post procedure care education.      Fluorescein Angiography Optos (Transit OS)       Right Eye Progression has no prior data. Early phase findings include staining, window defect. Mid/Late phase findings include staining, window defect (No CNV).   Left Eye Progression has no prior data. Early phase findings include staining, window defect, blockage. Mid/Late phase findings include window defect, staining, blockage (Focal blockage nasal to disc corresponding to peripapillary heme; No obvious CNV).   Notes **Images stored on drive**  Impression: OD: mild staining and window defect OS: focal blockage nasal disc corresponding to peripapillary heme; no obvious CNV / leakage           ASSESSMENT/PLAN:    ICD-10-CM   1. Vitreous hemorrhage of left eye (HCC)  H43.12 Intravitreal Injection, Pharmacologic Agent - OS - Left Eye    Bevacizumab (AVASTIN) SOLN 1.25 mg    2. 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 Severe myopia of both eyes  H52.13     4. Retinal edema  H35.81 OCT, Retina - OU - Both Eyes    5. Posterior vitreous detachment of both eyes  H43.813     6. Essential hypertension  I10     7. Hypertensive retinopathy of both eyes  H35.033 Fluorescein Angiography Optos (Transit OS)    8. Pseudophakia, both eyes  Z96.1  Vitreous Hemorrhage OS - pt hospitalized on 06.18.22 for diverticulitis that became sepsis - vision loss  first noted ~06.22.22 after coming off vent in ICU - b-scan 6.29.22 shows vitreous opacities consistent w/ VH, no obvious RT/RD or mass OS - s/p IVA OS #1 (06.29.22) for VH - today, VH improving showing nasal peripapillary heme OS - BCVA stable at 20/25 - FA today (07.27.22) shows mild staining / window defect + blockage OU -- no CNV - OCT shows interval improvement in vitreous opacities - IVA informed consent obtained and signed, 06.29.22 (OS) - recommend IVA OS #2 (07.27.22) for residual VH and peripapillary heme - RBA of procedure discussed, questions answered - informed consent obtained and signed - see procedure note  - VH precautions reviewed -- minimize activities, keep head elevated, avoid ASA/NSAIDs/blood thinners as able - f/u 2 weeks, DFE, OCT  2-4. 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 PVD / vitreous syneresis OU  - asymptomatic  - Discussed findings and prognosis  - No RT or RD on 360 peripheral exam  - Reviewed s/s of RT/RD  - Strict return precautions for any such RT/RD symptoms  6,7. Hypertensive retinopathy OU - discussed importance of tight BP control - monitor  8. Pseudophakia OU  - s/p CE/IOL OU (Dr. Delaney MeigsStonecipher)  - IOLs in good position, doing well - monitor  Ophthalmic Meds Ordered this visit:  Meds ordered this encounter  Medications   Bevacizumab (AVASTIN) SOLN 1.25 mg      Return in about 2 weeks (around 01/31/2021) for 2 weeks VH OS, DFE/OCT.  There are no Patient Instructions on file for this visit.  This document serves as a record of services personally performed by Karie ChimeraBrian G. Deamonte Sayegh, MD, PhD. It was created on their behalf by Herby AbrahamAshley  English, COA, an ophthalmic technician. The creation of this record is the provider's dictation and/or activities during the visit.    Electronically signed by: Herby AbrahamAshley English, COA @TODAY @ 11:08 PM  Karie ChimeraBrian G. Nakiah Osgood, M.D., Ph.D. Diseases & Surgery of the Retina and Vitreous [facility] 01/17/2021   I have reviewed the above documentation for accuracy and completeness, and I agree with the above. Karie ChimeraBrian G. Stephane Niemann, M.D., Ph.D. 01/18/21 11:08 PM  Abbreviations: M myopia (nearsighted); A astigmatism; H hyperopia (farsighted); P presbyopia; Mrx spectacle prescription;  CTL contact lenses; OD right eye; OS left eye; OU both eyes  XT exotropia; ET esotropia; PEK punctate epithelial keratitis; PEE punctate epithelial erosions; DES dry eye syndrome; MGD meibomian gland dysfunction; ATs artificial tears; PFAT's preservative free artificial tears; NSC nuclear sclerotic cataract; PSC posterior subcapsular cataract; ERM epi-retinal membrane; PVD posterior vitreous detachment; RD retinal detachment; DM diabetes mellitus; DR diabetic retinopathy; NPDR non-proliferative diabetic retinopathy; PDR proliferative diabetic retinopathy; CSME clinically significant macular edema; DME diabetic macular edema; dbh dot blot hemorrhages; CWS cotton wool spot; POAG primary open angle glaucoma; C/D cup-to-disc ratio; HVF humphrey visual field; GVF goldmann visual field; OCT optical coherence tomography; IOP intraocular pressure; BRVO Branch retinal vein occlusion; CRVO central retinal vein occlusion; CRAO central retinal artery occlusion; BRAO branch retinal artery occlusion; RT retinal tear;  SB scleral buckle; PPV pars plana vitrectomy; VH Vitreous hemorrhage; PRP panretinal laser photocoagulation; IVK intravitreal kenalog; VMT vitreomacular traction; MH Macular hole;  NVD neovascularization of the disc; NVE neovascularization elsewhere; AREDS age related eye disease study; ARMD age related macular degeneration;  POAG primary open angle glaucoma; EBMD epithelial/anterior basement membrane dystrophy; ACIOL anterior chamber intraocular lens; IOL intraocular lens; PCIOL posterior chamber intraocular lens; Phaco/IOL phacoemulsification with intraocular lens placement; PRK photorefractive keratectomy; LASIK laser assisted in situ keratomileusis; HTN hypertension; DM diabetes mellitus; COPD chronic obstructive pulmonary disease

## 2021-01-18 MED ORDER — BEVACIZUMAB CHEMO INJECTION 1.25MG/0.05ML SYRINGE FOR KALEIDOSCOPE
1.2500 mg | INTRAVITREAL | Status: AC | PRN
  Administered 2021-01-17: 1.25 mg via INTRAVITREAL

## 2021-01-22 ENCOUNTER — Ambulatory Visit (INDEPENDENT_AMBULATORY_CARE_PROVIDER_SITE_OTHER): Payer: PPO

## 2021-01-22 ENCOUNTER — Ambulatory Visit (INDEPENDENT_AMBULATORY_CARE_PROVIDER_SITE_OTHER): Payer: PPO | Admitting: Podiatry

## 2021-01-22 ENCOUNTER — Other Ambulatory Visit: Payer: Self-pay

## 2021-01-22 ENCOUNTER — Encounter: Payer: Self-pay | Admitting: *Deleted

## 2021-01-22 DIAGNOSIS — M76822 Posterior tibial tendinitis, left leg: Secondary | ICD-10-CM | POA: Diagnosis not present

## 2021-01-22 DIAGNOSIS — Z9889 Other specified postprocedural states: Secondary | ICD-10-CM

## 2021-01-22 MED ORDER — BETAMETHASONE SOD PHOS & ACET 6 (3-3) MG/ML IJ SUSP: 3.0000 mg | Freq: Once | INTRAMUSCULAR | Status: DC

## 2021-01-22 NOTE — Progress Notes (Signed)
    HPI: 75 y.o. female presenting today for evaluation of pain and tenderness to the medial aspect of the left ankle.  Patient denies a history of injury.  She states that she has been noticing an increase amount of pain and she currently has not done anything for treatment.  She presents for further treatment and evaluation.  She does have history of bunionectomy with partial second exostectomy of the left foot.  DOS: 09/01/2018  Past Medical History:  Diagnosis Date   Anxiety    Arnold-Chiari malformation (HCC)    Arthritis    Cirrhosis (HCC)    Colon polyps    Depression    Diverticulitis    Diverticulosis    Headache    Hepatitis C    Hypertension    Hypertensive retinopathy    IBS (irritable bowel syndrome)    UTI (urinary tract infection)      Physical Exam: General: The patient is alert and oriented x3 in no acute distress.  Dermatology: Skin is warm, dry and supple bilateral lower extremities. Negative for open lesions or macerations.  Vascular: Palpable pedal pulses bilaterally. No edema or erythema noted. Capillary refill within normal limits.  Neurological: Epicritic and protective threshold grossly intact bilaterally.   Musculoskeletal Exam: Pain on palpation noted along posterior tibial tendon of the left lower extremity. Range of motion within normal limits. Muscle strength 5/5 in all muscle groups bilateral lower extremities.  Radiographic Exam:  Normal osseous mineralization. Joint spaces preserved. No fracture or dislocation identified.  Orthopedic screws from the bunionectomy site are stable and intact.  Good alignment of the first ray.  Assessment: 1. Posterior tibial tendinitis left   Plan of Care:  1. Patient was evaluated. Radiographs were reviewed today. 2. Injection of 0.5 mL Celestone Soluspan injected into the posterior tibial tendon sheath.  3.  Ankle brace dispensed.  Wear daily 4.  OTC power step insoles dispensed today 5.  Return to clinic as  needed   Felecia Shelling, DPM [facility]  Dr. Felecia Shelling, DPM    [address] [place], [place] [address]                Office [phone]  Fax [phone]

## 2021-01-26 ENCOUNTER — Other Ambulatory Visit: Payer: Self-pay

## 2021-01-26 ENCOUNTER — Encounter: Payer: Self-pay | Admitting: Internal Medicine

## 2021-01-26 ENCOUNTER — Ambulatory Visit (INDEPENDENT_AMBULATORY_CARE_PROVIDER_SITE_OTHER): Payer: PPO | Admitting: Internal Medicine

## 2021-01-26 VITALS — BP 126/80 | HR 86 | Temp 98.0°F | Resp 18 | Ht 66.5 in | Wt 190.8 lb

## 2021-01-26 DIAGNOSIS — M545 Low back pain, unspecified: Secondary | ICD-10-CM | POA: Diagnosis not present

## 2021-01-26 DIAGNOSIS — F418 Other specified anxiety disorders: Secondary | ICD-10-CM

## 2021-01-26 DIAGNOSIS — R531 Weakness: Secondary | ICD-10-CM | POA: Diagnosis not present

## 2021-01-26 MED ORDER — TRAMADOL HCL 50 MG PO TABS: 50.0000 mg | ORAL_TABLET | Freq: Every day | ORAL | 2 refills | Status: DC | PRN

## 2021-01-26 NOTE — Assessment & Plan Note (Signed)
Having more problems with this due to recovery and being less active. She wishes to be more active and rx done for tramadol as this works well with minimal side effects for her. She understands usage. [place] narcotic database reviewed.

## 2021-01-26 NOTE — Progress Notes (Signed)
   Subjective:   Patient ID: Angela Ramirez, female    DOB: April 18, 1946, 75 y.o.   MRN: [id number]  HPI The patient is a 75 YO female coming in for follow up resolving sepsis symptoms. Still some weakness and tiredness. Would like do some water therapy as this worked well after some critical illness earlier in life. Denies any new symptoms of illness. Using tramadol rarely. Would like refill. Less nausea and appetite is returning and stomach is adjusting.   Review of Systems  Constitutional:  Positive for activity change and fatigue.  HENT: Negative.    Eyes: Negative.   Respiratory:  Negative for cough, chest tightness and shortness of breath.   Cardiovascular:  Negative for chest pain, palpitations and leg swelling.  Gastrointestinal:  Positive for nausea. Negative for abdominal distention, abdominal pain, constipation, diarrhea and vomiting.  Musculoskeletal: Negative.   Skin: Negative.   Neurological: Negative.   Psychiatric/Behavioral: Negative.     Objective:  Physical Exam Constitutional:      Appearance: She is well-developed.  HENT:     Head: Normocephalic and atraumatic.  Cardiovascular:     Rate and Rhythm: Normal rate and regular rhythm.  Pulmonary:     Effort: Pulmonary effort is normal. No respiratory distress.     Breath sounds: Normal breath sounds. No wheezing or rales.  Abdominal:     General: Bowel sounds are normal. There is no distension.     Palpations: Abdomen is soft.     Tenderness: There is no abdominal tenderness. There is no rebound.  Musculoskeletal:     Cervical back: Normal range of motion.  Skin:    General: Skin is warm and dry.  Neurological:     Mental Status: She is alert and oriented to person, place, and time.     Coordination: Coordination normal.    Vitals:   01/26/21 0832  BP: 126/80  Pulse: 86  Resp: 18  Temp: 98 F (36.7 C)  TempSrc: Oral  SpO2: 98%  Weight: 190 lb 12.8 oz (86.5 kg)  Height: 5' 6.5" (1.689 m)    This  visit occurred during the SARS-CoV-2 public health emergency.  Safety protocols were in place, including screening questions prior to the visit, additional usage of staff PPE, and extensive cleaning of exam room while observing appropriate contact time as indicated for disinfecting solutions.   Assessment & Plan:

## 2021-01-26 NOTE — Assessment & Plan Note (Signed)
Doing much better but still with some post ICU PTSD and working through that.

## 2021-01-26 NOTE — Assessment & Plan Note (Signed)
Needs PT and would benefit from water therapy to help. She is recovering and has made great strides in the last month or so.

## 2021-01-26 NOTE — Patient Instructions (Signed)
We will refill the tramadol and will get you in for the water therapy.

## 2021-01-29 NOTE — Progress Notes (Signed)
[facility] - Clinic Note  01/30/2021     CHIEF COMPLAINT Patient presents for Retina Follow Up   HISTORY OF PRESENT ILLNESS: Angela Ramirez is a 75 y.o. female who presents to the clinic today for:  HPI     Retina Follow Up   Patient presents with  Other.  In left eye.  Duration of 2 weeks.  Since onset it is gradually improving.  I, the attending physician,  performed the HPI with the patient and updated documentation appropriately.        Comments   2 week follow up VH OS- Vision is improving.  If she moves her eye around she sees it more but definitely improving. Using Refresh prn.      Last edited by Rennis Chris, MD on 02/01/2021  7:48 PM.      Referring physician: Myrlene Broker, MD [address] [place],  [place] [address]  HISTORICAL INFORMATION:   Selected notes from the MEDICAL RECORD NUMBER Originally referred by Dr. [name] for retina clearance for cat sx. Re-referred on 6.28.22 for new onset VH OS   CURRENT MEDICATIONS: No current outpatient medications on file. (Ophthalmic Drugs)   No current facility-administered medications for this visit. (Ophthalmic Drugs)   Current Outpatient Medications (Other)  Medication Sig   Acetaminophen-Caffeine 500-65 MG TABS Take 1 tablet by mouth every 6 (six) hours as needed (headache). Tension relief   amLODipine (NORVASC) 10 MG tablet Take 1 tablet (10 mg total) by mouth daily. (Patient taking differently: Take 5 mg by mouth every evening.)   hydrochlorothiazide (HYDRODIURIL) 25 MG tablet Take 25 mg by mouth daily.   linaclotide (LINZESS) 72 MCG capsule TAKE 1 CAPSULE(72 MCG) BY MOUTH DAILY BEFORE BREAKFAST   polyethylene glycol powder (GLYCOLAX/MIRALAX) 17 GM/SCOOP powder Dissolve 1 capful in 8 ounces of water and drink as needed for constipation   temazepam (RESTORIL) 15 MG capsule Take 1 capsule (15 mg total) by mouth 2 (two) times daily as needed for sleep (anxiety).   traMADol  (ULTRAM) 50 MG tablet Take 1 tablet (50 mg total) by mouth daily as needed.   No current facility-administered medications for this visit. (Other)   REVIEW OF SYSTEMS: ROS   Positive for: Gastrointestinal, Eyes, Psychiatric Negative for: Constitutional, Neurological, Skin, Genitourinary, Musculoskeletal, HENT, Endocrine, Cardiovascular, Respiratory, Allergic/Imm, Heme/Lymph Last edited by Joni Reining, COA on 01/30/2021  8:13 AM.     ALLERGIES Allergies  Allergen Reactions   Crab (Diagnostic) Hives   Morphine And Related Nausea And Vomiting   Sulfa Antibiotics Nausea And Vomiting    PAST MEDICAL HISTORY Past Medical History:  Diagnosis Date   Anxiety    Aortic atherosclerosis (HCC)    Arnold-Chiari malformation (HCC)    Arthritis    Cirrhosis (HCC)    Colon polyps    Depression    Diverticulitis    Diverticulosis    Emphysema of lung (HCC) 12/02/2020   per CT scan   Headache    Hepatitis C    Hiatal hernia    Hypertension    Hypertensive retinopathy    IBS (irritable bowel syndrome)    Internal hemorrhoids    Sepsis (HCC) 11/2020   UTI (urinary tract infection)    Past Surgical History:  Procedure Laterality Date   BRAIN SURGERY  2003   decompression   BREAST EXCISIONAL BIOPSY Bilateral    age 51's   CATARACT EXTRACTION     DILATION AND CURETTAGE OF UTERUS  EYE SURGERY     OVARIAN CYST REMOVAL Bilateral    REDUCTION MAMMAPLASTY Bilateral    25+ years ago    FAMILY HISTORY Family History  Problem Relation Age of Onset   Arthritis Mother    Hypertension Mother    Kidney disease Mother    Diabetes Mother    Alcohol abuse Father    Arthritis Father    Lung cancer Father    Hypertension Sister    Diabetes Maternal Grandmother    Breast cancer Paternal Grandmother    Parkinson's disease Son    Hypertension Daughter    Diabetes Maternal Aunt    Kidney failure Maternal Aunt    Diabetes Maternal Uncle    Rheum arthritis Sister    Breast cancer  Paternal Aunt    Breast cancer Paternal Aunt     SOCIAL HISTORY Social History   Tobacco Use   Smoking status: Former    Types: Cigarettes    Quit date: 09/29/1969    Years since quitting: 51.3   Smokeless tobacco: Never  Vaping Use   Vaping Use: Never used  Substance Use Topics   Alcohol use: Yes    Alcohol/week: 0.0 standard drinks    Comment: 2 glasses of wine a week   Drug use: No         OPHTHALMIC EXAM:  Base Eye Exam     Visual Acuity (Snellen - Linear)       Right Left   Dist [place] 20/30 +2 20/50 +1   Dist ph [place] 20/25 +1 20/25 -2         Tonometry (Tonopen, 8:21 AM)       Right Left   Pressure 13 13         Pupils       Dark Light Shape React APD   Right 4 3 Round Brisk None   Left 4 3 Round Brisk None         Visual Fields (Counting fingers)       Left Right     Full   Restrictions Partial outer superior nasal deficiency          Extraocular Movement       Right Left    Full Full         Neuro/Psych     Oriented x3: Yes   Mood/Affect: Normal         Dilation     Both eyes: 1.0% Mydriacyl, 2.5% Phenylephrine @ 8:21 AM           Slit Lamp and Fundus Exam     Slit Lamp Exam       Right Left   Lids/Lashes Dermatochalasis - upper lid, mild Meibomian gland dysfunction Dermatochalasis - upper lid, mild Meibomian gland dysfunction   Conjunctiva/Sclera White and quiet White and quiet   Cornea arcus, 1+Punctate epithelial erosions, well healed temporal cataract wounds mild arcus, 1+ inferior Punctate epithelial erosions   Anterior Chamber deep, narrow temporal angle deep, narrow temporal angle   Iris Round and dilated Round and dilated   Lens PC IOL in good position PC IOL in good position   Vitreous Vitreous syneresis, Posterior vitreous detachment, vitreous condensations Vitreous syneresis, Posterior vitreous detachment +RBC, vitreous condensations, blood stained vitreous condensations clearing centrally and settling  inferiorly--white         Fundus Exam       Right Left   Disc Tilted disc, Pink and Sharp, +cupping, +PPA/PPP Compact, mild Pallor, severe  tilt, 360 PPA -- broad, large inter retinal and pre-retinal heme nasal to disc -- CNV   C/D Ratio 0.75 0.4   Macula Flat, Blunted foveal reflex, RPE mottling and clumping, No heme or edema posterior staphyloma/CR atrophy inferior macula, Blunted foveal reflex, RPE mottling, clumping and atrophy, No heme, +myopic degeneration   Vessels attenuated, mild tortuousity, mild AV crossing changes attenuated   Periphery Attached, peripheral cystoid degeneration most prominent ST periphery; no RT/RD, blonde fundus Attached; no RT/RD; peripheral cystoid degen, paving stone degeneration inferiorly            IMAGING AND PROCEDURES  Imaging and Procedures for 01/30/2021  OCT, Retina - OU - Both Eyes       Right Eye Quality was good. Central Foveal Thickness: 253. Progression has been stable. Findings include normal foveal contour, no IRF, no SRF, myopic contour.   Left Eye Quality was good. Central Foveal Thickness: 346. Progression has been stable. Findings include myopic contour, intraretinal fluid, outer retinal atrophy (Persistent vitreous opacities; persistent cystic changes nasal macula).   Notes *Images captured and stored on drive  Diagnosis / Impression:  OD: NFP, no IRF/SRF; Myopic contour OS: Persistent vitreous opacities; persistent cystic changes nasal macula  Clinical management:  See below  Abbreviations: NFP - Normal foveal profile. CME - cystoid macular edema. PED - pigment epithelial detachment. IRF - intraretinal fluid. SRF - subretinal fluid. EZ - ellipsoid zone. ERM - epiretinal membrane. ORA - outer retinal atrophy. ORT - outer retinal tubulation. SRHM - subretinal hyper-reflective material. IRHM - intraretinal hyper-reflective material            ASSESSMENT/PLAN:    ICD-10-CM   1. Vitreous hemorrhage of left eye (HCC)   H43.12     2. 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 Severe myopia of both eyes  H52.13     4. Retinal edema  H35.81 OCT, Retina - OU - Both Eyes    5. Posterior vitreous detachment of both eyes  H43.813     6. Essential hypertension  I10     7. Hypertensive retinopathy of both eyes  H35.033     8. Pseudophakia, both eyes  Z96.1      Vitreous Hemorrhage OS - pt hospitalized on 06.18.22 for diverticulitis that became sepsis - vision loss first noted ~06.22.22 after coming off vent in ICU - b-scan 6.29.22 shows vitreous opacities consistent w/ VH, no obvious RT/RD or mass OS - s/p IVA OS #1 (06.29.22), #2 (07.27.22) - today, VH improving showing nasal peripapillary heme OS - BCVA stable at 20/25 - FA (07.27.22) shows mild staining / window defect + blockage OU -- no CNV - OCT shows mild persistent vitreous opacities - IVA informed consent obtained and signed, 06.29.22 (OS) - VH precautions reviewed -- minimize activities, keep head elevated, avoid ASA/NSAIDs/blood thinners as able - f/u 2 weeks, DFE, OCT  2-4. 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 PVD / vitreous syneresis OU  - asymptomatic  - Discussed findings and prognosis  - No RT or RD on 360 peripheral exam  -  Reviewed s/s of RT/RD  - Strict return precautions for any such RT/RD symptoms  6,7. Hypertensive retinopathy OU - discussed importance of tight BP control - monitor  8. Pseudophakia OU  - s/p CE/IOL OU (Dr. Delaney MeigsStonecipher)  - IOLs in good position, doing well - monitor  Ophthalmic Meds Ordered this visit:  No orders of the defined types were placed in this  encounter.     Return in about 2 weeks (around 02/13/2021) for 2 weeks VH OS, DFE/OCT.  There are no Patient Instructions on file for this visit.  This document serves as a record of services personally performed by Karie ChimeraBrian G. Syan Cullimore, MD, PhD. It was created on their behalf by Glee ArvinAmanda J. Manson PasseyBrown, OA an ophthalmic technician. The creation of this record is the provider's dictation and/or activities during the visit.    Electronically signed by: Glee ArvinAmanda J. Manson PasseyBrown, [place] 08.08.2022 7:51 PM  Karie ChimeraBrian G. Brayden Brodhead, M.D., Ph.D. Diseases & Surgery of the Retina and Vitreous [facility]  I have reviewed the above documentation for accuracy and completeness, and I agree with the above. Karie ChimeraBrian G. Neiko Trivedi, M.D., Ph.D. 02/01/21 7:53 PM   Abbreviations: M myopia (nearsighted); A astigmatism; H hyperopia (farsighted); P presbyopia; Mrx spectacle prescription;  CTL contact lenses; OD right eye; OS left eye; OU both eyes  XT exotropia; ET esotropia; PEK punctate epithelial keratitis; PEE punctate epithelial erosions; DES dry eye syndrome; MGD meibomian gland dysfunction; ATs artificial tears; PFAT's preservative free artificial tears; NSC nuclear sclerotic cataract; PSC posterior subcapsular cataract; ERM epi-retinal membrane; PVD posterior vitreous detachment; RD retinal detachment; DM diabetes mellitus; DR diabetic retinopathy; NPDR non-proliferative diabetic retinopathy; PDR proliferative diabetic retinopathy; CSME clinically significant macular edema; DME diabetic macular edema; dbh dot blot hemorrhages; CWS cotton wool spot; POAG primary open angle glaucoma; C/D cup-to-disc ratio; HVF humphrey visual field; GVF goldmann visual field; OCT optical coherence tomography; IOP intraocular pressure; BRVO Branch retinal vein occlusion; CRVO central retinal vein occlusion; CRAO central retinal artery occlusion; BRAO branch retinal artery occlusion; RT retinal tear; SB scleral buckle; PPV pars plana vitrectomy;  VH Vitreous hemorrhage; PRP panretinal laser photocoagulation; IVK intravitreal kenalog; VMT vitreomacular traction; MH Macular hole;  NVD neovascularization of the disc; NVE neovascularization elsewhere; AREDS age related eye disease study; ARMD age related macular degeneration; POAG primary open angle glaucoma; EBMD epithelial/anterior basement membrane dystrophy; ACIOL anterior chamber intraocular lens; IOL intraocular lens; PCIOL posterior chamber intraocular lens; Phaco/IOL phacoemulsification with intraocular lens placement; PRK photorefractive keratectomy; LASIK laser assisted in situ keratomileusis; HTN hypertension; DM diabetes mellitus; COPD chronic obstructive pulmonary disease

## 2021-01-30 ENCOUNTER — Ambulatory Visit (INDEPENDENT_AMBULATORY_CARE_PROVIDER_SITE_OTHER): Payer: PPO | Admitting: Ophthalmology

## 2021-01-30 ENCOUNTER — Other Ambulatory Visit: Payer: Self-pay

## 2021-01-30 DIAGNOSIS — Z961 Presence of intraocular lens: Secondary | ICD-10-CM | POA: Diagnosis not present

## 2021-01-30 DIAGNOSIS — H442E3 Degenerative myopia with other maculopathy, bilateral eye: Secondary | ICD-10-CM | POA: Diagnosis not present

## 2021-01-30 DIAGNOSIS — I10 Essential (primary) hypertension: Secondary | ICD-10-CM | POA: Diagnosis not present

## 2021-01-30 DIAGNOSIS — H43813 Vitreous degeneration, bilateral: Secondary | ICD-10-CM

## 2021-01-30 DIAGNOSIS — H35033 Hypertensive retinopathy, bilateral: Secondary | ICD-10-CM

## 2021-01-30 DIAGNOSIS — H4312 Vitreous hemorrhage, left eye: Secondary | ICD-10-CM

## 2021-01-30 DIAGNOSIS — H3581 Retinal edema: Secondary | ICD-10-CM | POA: Diagnosis not present

## 2021-01-30 DIAGNOSIS — H5213 Myopia, bilateral: Secondary | ICD-10-CM

## 2021-02-01 ENCOUNTER — Encounter (INDEPENDENT_AMBULATORY_CARE_PROVIDER_SITE_OTHER): Payer: Self-pay | Admitting: Ophthalmology

## 2021-02-09 NOTE — Progress Notes (Signed)
[facility] - Clinic Note  02/13/2021     CHIEF COMPLAINT Patient presents for Retina Follow Up   HISTORY OF PRESENT ILLNESS: Angela Ramirez is a 75 y.o. female who presents to the clinic today for:  HPI     Retina Follow Up   Patient presents with  Other.  In left eye.  This started 2 weeks ago.  I, the attending physician,  performed the HPI with the patient and updated documentation appropriately.      Last edited by Rennis ChrisZamora, Evonna Stoltz, MD on 02/18/2021 12:26 AM.    Patient states she has increased her walking to 3 times a day.     Referring physician: Myrlene Brokerrawford, Elizabeth A, MD [address] [place] [id number]  HISTORICAL INFORMATION:   Selected notes from the MEDICAL RECORD NUMBER Originally referred by Dr. Delaney MeigsStonecipher for retina clearance for cat sx. Re-referred on 6.28.22 for new onset VH OS   CURRENT MEDICATIONS: No current outpatient medications on file. (Ophthalmic Drugs)   No current facility-administered medications for this visit. (Ophthalmic Drugs)   Current Outpatient Medications (Other)  Medication Sig   Acetaminophen-Caffeine 500-65 MG TABS Take 1 tablet by mouth every 6 (six) hours as needed (headache). Tension relief   amLODipine (NORVASC) 10 MG tablet Take 1 tablet (10 mg total) by mouth daily. (Patient taking differently: Take 5 mg by mouth every evening.)   hydrochlorothiazide (HYDRODIURIL) 25 MG tablet Take 25 mg by mouth daily.   linaclotide (LINZESS) 72 MCG capsule TAKE 1 CAPSULE(72 MCG) BY MOUTH DAILY BEFORE BREAKFAST   polyethylene glycol powder (GLYCOLAX/MIRALAX) 17 GM/SCOOP powder Dissolve 1 capful in 8 ounces of water and drink as needed for constipation   temazepam (RESTORIL) 15 MG capsule Take 1 capsule (15 mg total) by mouth 2 (two) times daily as needed for sleep (anxiety).   traMADol (ULTRAM) 50 MG tablet Take 1 tablet (50 mg total) by mouth daily as needed.   No current facility-administered medications for this  visit. (Other)   REVIEW OF SYSTEMS:   ALLERGIES Allergies  Allergen Reactions   Crab (Diagnostic) Hives   Morphine And Related Nausea And Vomiting   Sulfa Antibiotics Nausea And Vomiting    PAST MEDICAL HISTORY Past Medical History:  Diagnosis Date   Anxiety    Aortic atherosclerosis (HCC)    Arnold-Chiari malformation (HCC)    Arthritis    Cirrhosis (HCC)    Colon polyps    Depression    Diverticulitis    Diverticulosis    Emphysema of lung (HCC) 12/02/2020   per CT scan   Headache    Hepatitis C    Hiatal hernia    Hypertension    Hypertensive retinopathy    IBS (irritable bowel syndrome)    Internal hemorrhoids    Sepsis (HCC) 11/2020   UTI (urinary tract infection)    Past Surgical History:  Procedure Laterality Date   BRAIN SURGERY  2003   decompression   BREAST EXCISIONAL BIOPSY Bilateral    age 75's   CATARACT EXTRACTION     DILATION AND CURETTAGE OF UTERUS     EYE SURGERY     OVARIAN CYST REMOVAL Bilateral    REDUCTION MAMMAPLASTY Bilateral    25+ years ago    FAMILY HISTORY Family History  Problem Relation Age of Onset   Arthritis Mother    Hypertension Mother    Kidney disease Mother    Diabetes Mother    Alcohol abuse Father  Arthritis Father    Lung cancer Father    Hypertension Sister    Diabetes Maternal Grandmother    Breast cancer Paternal Grandmother    Parkinson's disease Son    Hypertension Daughter    Diabetes Maternal Aunt    Kidney failure Maternal Aunt    Diabetes Maternal Uncle    Rheum arthritis Sister    Breast cancer Paternal Aunt    Breast cancer Paternal Aunt     SOCIAL HISTORY Social History   Tobacco Use   Smoking status: Former    Types: Cigarettes    Quit date: 09/29/1969    Years since quitting: 51.4   Smokeless tobacco: Never  Vaping Use   Vaping Use: Never used  Substance Use Topics   Alcohol use: Yes    Alcohol/week: 0.0 standard drinks    Comment: 2 glasses of wine a week   Drug use: No          OPHTHALMIC EXAM:  Base Eye Exam     Visual Acuity (Snellen - Linear)       Right Left   Dist [place] 20/20 -1 20/40 +2   Dist ph [place]  20/20 -2         Tonometry (Tonopen, 9:26 AM)       Right Left   Pressure 15 14         Pupils       Dark Light Shape React APD   Right 4 3 Round Brisk None   Left 4 3 Round Brisk None         Visual Fields       Left Right     Full   Restrictions Partial outer superior nasal deficiency          Extraocular Movement       Right Left    Full Full         Neuro/Psych     Oriented x3: Yes   Mood/Affect: Normal         Dilation     Both eyes: 1.0% Mydriacyl, 2.5% Phenylephrine @ 9:26 AM           Slit Lamp and Fundus Exam     Slit Lamp Exam       Right Left   Lids/Lashes Dermatochalasis - upper lid, mild Meibomian gland dysfunction Dermatochalasis - upper lid, mild Meibomian gland dysfunction   Conjunctiva/Sclera White and quiet White and quiet   Cornea arcus, trace Punctate epithelial erosions, well healed temporal cataract wounds mild arcus, trace Punctate epithelial erosions   Anterior Chamber deep, narrow temporal angle deep, narrow temporal angle   Iris Round and dilated Round and dilated   Lens Toric PC IOL in good position marks at 530 and 1130, trace PCO PC IOL in good position   Vitreous Vitreous syneresis, Posterior vitreous detachment, vitreous condensations Vitreous syneresis, Posterior vitreous detachment +RBC, vitreous condensations, blood stained vitreous condensations clearing centrally and settling inferiorly--white         Fundus Exam       Right Left   Disc Tilted disc, Pink and Sharp, +cupping, +PPA/PPP Compact, mild Pallor, severe tilt, 360 PPA -- broad, large inter retinal and pre-retinal heme nasal to disc improving -- CNV   C/D Ratio 0.75 0.4   Macula Flat, Blunted foveal reflex, RPE mottling and clumping, No heme or edema posterior staphyloma/CR atrophy inferior macula, Blunted  foveal reflex, RPE mottling, clumping and atrophy, No heme, +myopic degeneration   Vessels attenuated,  mild tortuousity, mild AV crossing changes attenuated   Periphery Attached, peripheral cystoid degeneration most prominent ST periphery; no RT/RD, blonde fundus Attached; no RT/RD; peripheral cystoid degen, paving stone degeneration inferiorly            IMAGING AND PROCEDURES  Imaging and Procedures for 02/13/2021  OCT, Retina - OU - Both Eyes       Right Eye Quality was good. Central Foveal Thickness: 259. Progression has been stable. Findings include normal foveal contour, no IRF, no SRF, myopic contour.   Left Eye Quality was good. Central Foveal Thickness: 326. Progression has improved. Findings include myopic contour, intraretinal fluid, outer retinal atrophy (Mild interval improvement in vitreous opacities; persistent cystic changes nasal macula).   Notes *Images captured and stored on drive  Diagnosis / Impression:  OD: NFP, no IRF/SRF; Myopic contour OS: Mild interval improvement in vitreous opacities; persistent cystic changes nasal macula  Clinical management:  See below  Abbreviations: NFP - Normal foveal profile. CME - cystoid macular edema. PED - pigment epithelial detachment. IRF - intraretinal fluid. SRF - subretinal fluid. EZ - ellipsoid zone. ERM - epiretinal membrane. ORA - outer retinal atrophy. ORT - outer retinal tubulation. SRHM - subretinal hyper-reflective material. IRHM - intraretinal hyper-reflective material             ASSESSMENT/PLAN:    ICD-10-CM   1. Vitreous hemorrhage of left eye (HCC)  H43.12     2. 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 Severe myopia of both eyes  H52.13     4. Retinal edema  H35.81 OCT, Retina - OU - Both Eyes    5. Posterior vitreous detachment of both eyes  H43.813     6. Essential hypertension  I10     7. Hypertensive retinopathy of both eyes  H35.033     8.  Pseudophakia, both eyes  Z96.1      Vitreous Hemorrhage OS - pt hospitalized on 06.18.22 for diverticulitis that became sepsis - vision loss first noted ~06.22.22 after coming off vent in ICU - b-scan 6.29.22 shows vitreous opacities consistent w/ VH, no obvious RT/RD or mass OS - s/p IVA OS #1 (06.29.22), #2 (07.27.22) - today, VH improving and nasal peripapillary heme improving OS - BCVA is 20/20 - FA (07.27.22) shows mild staining / window defect + blockage OU -- no CNV - OCT shows mild interval improvement in vitreous opacities - IVA informed consent obtained and signed, 06.29.22 (OS) - VH precautions reviewed -- minimize activities, keep head elevated, avoid ASA/NSAIDs/blood thinners as able - f/u 2 weeks, DFE, OCT, possible injection   2-4. 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 PVD / vitreous syneresis OU  - asymptomatic  - Discussed findings and prognosis  - No RT or RD on 360 peripheral exam  - Reviewed s/s of RT/RD  - Strict return precautions for any such RT/RD symptoms  6,7. Hypertensive retinopathy OU - discussed importance of tight BP control - monitor  8. Pseudophakia OU  - s/p CE/IOL OU (Dr. [name])  - IOLs in good position, doing  well - monitor  Ophthalmic Meds Ordered this visit:  No orders of the defined types were placed in this encounter.     Return for Return in 2 weeks, DFE, OCT, possible injection.  There are no Patient Instructions on file for this visit.  This document serves as a record of services personally performed by Karie Chimera, MD, PhD. It was created on their behalf by Glee Arvin. Manson Passey, OA an ophthalmic technician. The  creation of this record is the provider's dictation and/or activities during the visit.    Electronically signed by: Glee Arvin. Kristopher Oppenheim 08.19.2022 12:32 AM  Karie Chimera, M.D., Ph.D. Diseases & Surgery of the Retina and Vitreous [facility]  I have reviewed the above documentation for accuracy and completeness, and I agree with the above. Karie Chimera, M.D., Ph.D. 02/18/21 12:32 AM    Abbreviations: M myopia (nearsighted); A astigmatism; H hyperopia (farsighted); P presbyopia; Mrx spectacle prescription;  CTL contact lenses; OD right eye; OS left eye; OU both eyes  XT exotropia; ET esotropia; PEK punctate epithelial keratitis; PEE punctate epithelial erosions; DES dry eye syndrome; MGD meibomian gland dysfunction; ATs artificial tears; PFAT's preservative free artificial tears; NSC nuclear sclerotic cataract; PSC posterior subcapsular cataract; ERM epi-retinal membrane; PVD posterior vitreous detachment; RD retinal detachment; DM diabetes mellitus; DR diabetic retinopathy; NPDR non-proliferative diabetic retinopathy; PDR proliferative diabetic retinopathy; CSME clinically significant macular edema; DME diabetic macular edema; dbh dot blot hemorrhages; CWS cotton wool spot; POAG primary open angle glaucoma; C/D cup-to-disc ratio; HVF humphrey visual field; GVF goldmann visual field; OCT optical coherence tomography; IOP intraocular pressure; BRVO Branch retinal vein occlusion; CRVO central retinal vein occlusion; CRAO central retinal artery occlusion; BRAO branch retinal artery occlusion; RT retinal tear; SB scleral buckle; PPV pars plana vitrectomy; VH Vitreous hemorrhage; PRP panretinal laser photocoagulation; IVK intravitreal kenalog; VMT vitreomacular traction; MH Macular hole;  NVD neovascularization of the disc; NVE neovascularization elsewhere; AREDS age related eye disease study; ARMD age related macular degeneration; POAG primary open angle glaucoma; EBMD  epithelial/anterior basement membrane dystrophy; ACIOL anterior chamber intraocular lens; IOL intraocular lens; PCIOL posterior chamber intraocular lens; Phaco/IOL phacoemulsification with intraocular lens placement; PRK photorefractive keratectomy; LASIK laser assisted in situ keratomileusis; HTN hypertension; DM diabetes mellitus; COPD chronic obstructive pulmonary disease

## 2021-02-10 DIAGNOSIS — Q0700 Arnold-Chiari syndrome without spina bifida or hydrocephalus: Secondary | ICD-10-CM | POA: Diagnosis not present

## 2021-02-10 DIAGNOSIS — K5732 Diverticulitis of large intestine without perforation or abscess without bleeding: Secondary | ICD-10-CM | POA: Diagnosis not present

## 2021-02-10 DIAGNOSIS — R531 Weakness: Secondary | ICD-10-CM | POA: Diagnosis not present

## 2021-02-10 DIAGNOSIS — A419 Sepsis, unspecified organism: Secondary | ICD-10-CM | POA: Diagnosis not present

## 2021-02-13 ENCOUNTER — Encounter (INDEPENDENT_AMBULATORY_CARE_PROVIDER_SITE_OTHER): Payer: Self-pay | Admitting: Ophthalmology

## 2021-02-13 ENCOUNTER — Other Ambulatory Visit: Payer: Self-pay

## 2021-02-13 ENCOUNTER — Ambulatory Visit (INDEPENDENT_AMBULATORY_CARE_PROVIDER_SITE_OTHER): Payer: PPO | Admitting: Ophthalmology

## 2021-02-13 DIAGNOSIS — H442E3 Degenerative myopia with other maculopathy, bilateral eye: Secondary | ICD-10-CM

## 2021-02-13 DIAGNOSIS — I10 Essential (primary) hypertension: Secondary | ICD-10-CM

## 2021-02-13 DIAGNOSIS — Z961 Presence of intraocular lens: Secondary | ICD-10-CM | POA: Diagnosis not present

## 2021-02-13 DIAGNOSIS — H3581 Retinal edema: Secondary | ICD-10-CM

## 2021-02-13 DIAGNOSIS — H4312 Vitreous hemorrhage, left eye: Secondary | ICD-10-CM

## 2021-02-13 DIAGNOSIS — H43813 Vitreous degeneration, bilateral: Secondary | ICD-10-CM

## 2021-02-13 DIAGNOSIS — H35033 Hypertensive retinopathy, bilateral: Secondary | ICD-10-CM

## 2021-02-13 DIAGNOSIS — H5213 Myopia, bilateral: Secondary | ICD-10-CM

## 2021-02-18 ENCOUNTER — Encounter (INDEPENDENT_AMBULATORY_CARE_PROVIDER_SITE_OTHER): Payer: Self-pay | Admitting: Ophthalmology

## 2021-02-23 NOTE — Progress Notes (Signed)
[facility] - Clinic Note  02/27/2021     CHIEF COMPLAINT Patient presents for Retina Follow Up   HISTORY OF PRESENT ILLNESS: Angela Ramirez is a 75 y.o. female who presents to the clinic today for:  HPI     Retina Follow Up   Patient presents with  Other.  In left eye.  This started months ago.  Severity is moderate.  Duration of 2 weeks.  Since onset it is stable.  I, the attending physician,  performed the HPI with the patient and updated documentation appropriately.        Comments   75 y/o female pt here for 2 wk f/u for VH OS.  No change in [place] OU.  Still has some floaters OS.  Had a small floater OD yesterday, but its gone today.  Denies pain, FOL.  Refresh prn OU.      Last edited by Rennis Chris, MD on 02/27/2021 12:17 PM.    Pt states her floaters are persistent in both eyes, she states she has been eating pineapple every day and feels like there is a significant difference in her floaters than when she doesn't eat it  Referring physician: Tyrone Schimke, MD No address on file  HISTORICAL INFORMATION:   Selected notes from the MEDICAL RECORD NUMBER Originally referred by Dr. [name] for retina clearance for cat sx. Re-referred on 6.28.22 for new onset VH OS   CURRENT MEDICATIONS: No current outpatient medications on file. (Ophthalmic Drugs)   No current facility-administered medications for this visit. (Ophthalmic Drugs)   Current Outpatient Medications (Other)  Medication Sig   Acetaminophen-Caffeine 500-65 MG TABS Take 1 tablet by mouth every 6 (six) hours as needed (headache). Tension relief   amLODipine (NORVASC) 10 MG tablet Take 1 tablet (10 mg total) by mouth daily. (Patient taking differently: Take 5 mg by mouth every evening.)   hydrochlorothiazide (HYDRODIURIL) 25 MG tablet Take 25 mg by mouth daily.   linaclotide (LINZESS) 72 MCG capsule TAKE 1 CAPSULE(72 MCG) BY MOUTH DAILY BEFORE BREAKFAST   polyethylene glycol powder  (GLYCOLAX/MIRALAX) 17 GM/SCOOP powder Dissolve 1 capful in 8 ounces of water and drink as needed for constipation   temazepam (RESTORIL) 15 MG capsule Take 1 capsule (15 mg total) by mouth 2 (two) times daily as needed for sleep (anxiety).   traMADol (ULTRAM) 50 MG tablet Take 1 tablet (50 mg total) by mouth daily as needed.   No current facility-administered medications for this visit. (Other)   REVIEW OF SYSTEMS: ROS   Positive for: Gastrointestinal, Neurological, Eyes, Psychiatric Negative for: Constitutional, Skin, Genitourinary, Musculoskeletal, HENT, Endocrine, Cardiovascular, Respiratory, Allergic/Imm, Heme/Lymph Last edited by Celine Mans, COA on 02/27/2021  8:54 AM.      ALLERGIES Allergies  Allergen Reactions   Crab (Diagnostic) Hives   Morphine And Related Nausea And Vomiting   Sulfa Antibiotics Nausea And Vomiting    PAST MEDICAL HISTORY Past Medical History:  Diagnosis Date   Anxiety    Aortic atherosclerosis (HCC)    Arnold-Chiari malformation (HCC)    Arthritis    Cirrhosis (HCC)    Colon polyps    Depression    Diverticulitis    Diverticulosis    Emphysema of lung (HCC) 12/02/2020   per CT scan   Headache    Hepatitis C    Hiatal hernia    Hypertension    Hypertensive retinopathy    IBS (irritable bowel syndrome)    Internal hemorrhoids  Sepsis (HCC) 11/2020   UTI (urinary tract infection)    Past Surgical History:  Procedure Laterality Date   BRAIN SURGERY  2003   decompression   BREAST EXCISIONAL BIOPSY Bilateral    age 83's   CATARACT EXTRACTION     DILATION AND CURETTAGE OF UTERUS     EYE SURGERY     OVARIAN CYST REMOVAL Bilateral    REDUCTION MAMMAPLASTY Bilateral    25+ years ago    FAMILY HISTORY Family History  Problem Relation Age of Onset   Arthritis Mother    Hypertension Mother    Kidney disease Mother    Diabetes Mother    Alcohol abuse Father    Arthritis Father    Lung cancer Father    Hypertension Sister     Diabetes Maternal Grandmother    Breast cancer Paternal Grandmother    Parkinson's disease Son    Hypertension Daughter    Diabetes Maternal Aunt    Kidney failure Maternal Aunt    Diabetes Maternal Uncle    Rheum arthritis Sister    Breast cancer Paternal Aunt    Breast cancer Paternal Aunt     SOCIAL HISTORY Social History   Tobacco Use   Smoking status: Former    Types: Cigarettes    Quit date: 09/29/1969    Years since quitting: 51.4   Smokeless tobacco: Never  Vaping Use   Vaping Use: Never used  Substance Use Topics   Alcohol use: Yes    Alcohol/week: 0.0 standard drinks    Comment: 2 glasses of wine a week   Drug use: No         OPHTHALMIC EXAM:  Base Eye Exam     Visual Acuity (Snellen - Linear)       Right Left   Dist [place] 20/20 - 20/40   Dist ph [place]  20/20 -         Tonometry (Tonopen, 8:57 AM)       Right Left   Pressure 16 13         Pupils       Dark Light Shape React APD   Right 4 3 Round Brisk None   Left 4 3 Round Brisk None         Visual Fields (Counting fingers)       Left Right    Full Full         Extraocular Movement       Right Left    Full, Ortho Full, Ortho         Neuro/Psych     Oriented x3: Yes   Mood/Affect: Normal         Dilation     Both eyes: 1.0% Mydriacyl, 2.5% Phenylephrine @ 8:57 AM           Slit Lamp and Fundus Exam     Slit Lamp Exam       Right Left   Lids/Lashes Dermatochalasis - upper lid, mild Meibomian gland dysfunction Dermatochalasis - upper lid, mild Meibomian gland dysfunction   Conjunctiva/Sclera White and quiet White and quiet   Cornea arcus, trace Punctate epithelial erosions, well healed temporal cataract wounds mild arcus, trace Punctate epithelial erosions   Anterior Chamber deep, narrow temporal angle deep, narrow temporal angle   Iris Round and dilated Round and dilated   Lens Toric PC IOL in good position marks at 530 and 1130, trace PCO PC IOL in good  position   Vitreous Vitreous  syneresis, Posterior vitreous detachment, vitreous condensations Vitreous syneresis, Posterior vitreous detachment, vitreous condensations, blood stained vitreous condensations clearing centrally and settling inferiorly--white         Fundus Exam       Right Left   Disc Tilted disc, Pink and Sharp, +cupping, +PPA/PPP Compact, mild Pallor, severe tilt, 360 PPA -- broad, large inter retinal and pre-retinal heme nasal to disc persistent -- CNV   C/D Ratio 0.75 0.4   Macula Flat, Blunted foveal reflex, RPE mottling and clumping, No heme or edema posterior staphyloma/CR atrophy inferior macula, Blunted foveal reflex, RPE mottling, clumping and atrophy, No heme, +myopic degeneration   Vessels attenuated, mild tortuousity, mild AV crossing changes attenuated   Periphery Attached, peripheral cystoid degeneration most prominent ST periphery; no RT/RD, blonde fundus Attached; no RT/RD; peripheral cystoid degen, paving stone degeneration inferiorly            IMAGING AND PROCEDURES  Imaging and Procedures for 02/27/2021  OCT, Retina - OU - Both Eyes       Right Eye Quality was good. Central Foveal Thickness: 260. Progression has been stable. Findings include normal foveal contour, no IRF, no SRF, myopic contour.   Left Eye Quality was good. Central Foveal Thickness: 310. Progression has been stable. Findings include myopic contour, intraretinal fluid, outer retinal atrophy (Mild persistent vitreous opacities; persistent cystic changes nasal macula).   Notes *Images captured and stored on drive  Diagnosis / Impression:  OD: NFP, no IRF/SRF; Myopic contour OS: Mild persistent vitreous opacities; persistent cystic changes nasal macula  Clinical management:  See below  Abbreviations: NFP - Normal foveal profile. CME - cystoid macular edema. PED - pigment epithelial detachment. IRF - intraretinal fluid. SRF - subretinal fluid. EZ - ellipsoid zone. ERM -  epiretinal membrane. ORA - outer retinal atrophy. ORT - outer retinal tubulation. SRHM - subretinal hyper-reflective material. IRHM - intraretinal hyper-reflective material      Intravitreal Injection, Pharmacologic Agent - OS - Left Eye       Time Out 02/27/2021. 9:27 AM. Confirmed correct patient, procedure, site, and patient consented.   Anesthesia Topical anesthesia was used. Anesthetic medications included Lidocaine 2%, Proparacaine 0.5%.   Procedure Preparation included 5% betadine to ocular surface, eyelid speculum.   Injection: 1.25 mg Bevacizumab 1.25mg /0.58ml   Route: Intravitreal, Site: Left Eye   NDC: [id number], Lot: [id number], Expiration date: 01/15/2021, Waste: 0 mL   Post-op Post injection exam found visual acuity of at least counting fingers. The patient tolerated the procedure well. There were no complications. The patient received written and verbal post procedure care education. Post injection medications were not given.            ASSESSMENT/PLAN:    ICD-10-CM   1. Vitreous hemorrhage of left eye (HCC)  H43.12 Intravitreal Injection, Pharmacologic Agent - OS - Left Eye    Bevacizumab (AVASTIN) SOLN 1.25 mg    2. 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 Severe myopia of both eyes  H52.13     4. Retinal edema  H35.81 OCT, Retina - OU - Both Eyes    5. Posterior vitreous detachment of both eyes  H43.813     6. Essential hypertension  I10     7. Hypertensive retinopathy of both eyes  H35.033     8. Pseudophakia, both eyes  Z96.1  Vitreous Hemorrhage OS - pt hospitalized on 06.18.22 for diverticulitis that became sepsis - vision loss first noted ~06.22.22 after coming off vent in ICU - b-scan 6.29.22 shows vitreous opacities consistent w/ VH, no obvious RT/RD or mass OS - s/p IVA OS #1 (06.29.22), #2  (07.27.22) - today, VH improving and nasal peripapillary heme improving OS - BCVA is 20/20 - FA (07.27.22) shows mild staining / window defect + blockage OU -- no CNV - OCT shows mild interval improvement in vitreous opacities - recommend IVA OS #3 today, 09.06.22 - pt wishes to proceed - RBA of procedure discussed, questions answered - see procedure note - IVA informed consent obtained and signed, 06.29.22 (OS) - VH precautions reviewed -- minimize activities, keep head elevated, avoid ASA/NSAIDs/blood thinners as able - f/u 3 weeks, DFE, OCT, possible injection   2-4. 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 PVD / vitreous syneresis OU  - asymptomatic  - Discussed findings and prognosis  - No RT or RD on 360 peripheral exam  - Reviewed s/s of RT/RD  - Strict return precautions for any such RT/RD symptoms  6,7. Hypertensive retinopathy OU - discussed importance of tight BP control - monitor  8. Pseudophakia OU  - s/p CE/IOL OU (Dr. [name])  - IOLs in good position, doing well - monitor  Ophthalmic Meds Ordered this visit:  Meds ordered this encounter  Medications   Bevacizumab (AVASTIN) SOLN 1.25 mg       Return in about 3 weeks (around 03/20/2021) for f/u VH OS, DFE, OCT.  There are no Patient Instructions on file for this visit.  This document serves as a record of services personally performed by Karie Chimera, MD, PhD. It was created on their behalf by Cristopher Estimable, COT an ophthalmic technician. The creation of this record is the provider's dictation and/or activities during the visit.    Electronically signed by: Cristopher Estimable, COT 9.2.22 @ 12:18 PM    This document serves as a record of services personally performed by Karie Chimera, MD, PhD. It was created on their behalf by Glee Arvin. Manson Passey, OA an ophthalmic technician. The creation of this record is the provider's dictation and/or activities during the visit.    Electronically signed by: Glee Arvin. Kristopher Oppenheim 09.06.2022 12:18 PM   Karie Chimera, M.D., Ph.D. Diseases & Surgery of the Retina and Vitreous [facility]  I have reviewed the above documentation for accuracy and completeness, and I agree with the above. Karie Chimera, M.D., Ph.D. 02/27/21 12:19 PM  Abbreviations: M myopia (nearsighted); A astigmatism; H hyperopia (farsighted); P presbyopia; Mrx spectacle prescription;  CTL contact lenses; OD right eye; OS left eye; OU both eyes  XT exotropia; ET esotropia; PEK punctate epithelial keratitis; PEE punctate epithelial erosions; DES dry eye syndrome; MGD meibomian gland dysfunction; ATs artificial tears; PFAT's preservative free artificial tears; NSC nuclear sclerotic cataract; PSC posterior subcapsular cataract; ERM epi-retinal membrane; PVD posterior vitreous detachment; RD retinal detachment; DM diabetes mellitus; DR diabetic retinopathy; NPDR non-proliferative diabetic retinopathy; PDR proliferative diabetic retinopathy; CSME clinically significant macular edema; DME diabetic  macular edema; dbh dot blot hemorrhages; CWS cotton wool spot; POAG primary open angle glaucoma; C/D cup-to-disc ratio; HVF humphrey visual field; GVF goldmann visual field; OCT optical coherence tomography; IOP intraocular pressure; BRVO Branch retinal vein occlusion; CRVO central retinal vein occlusion; CRAO central retinal artery occlusion; BRAO branch retinal artery occlusion; RT retinal tear; SB scleral buckle; PPV pars plana vitrectomy; VH Vitreous hemorrhage; PRP panretinal laser photocoagulation; IVK intravitreal kenalog; VMT vitreomacular traction; MH Macular hole;  NVD  neovascularization of the disc; NVE neovascularization elsewhere; AREDS age related eye disease study; ARMD age related macular degeneration; POAG primary open angle glaucoma; EBMD epithelial/anterior basement membrane dystrophy; ACIOL anterior chamber intraocular lens; IOL intraocular lens; PCIOL posterior chamber intraocular lens; Phaco/IOL phacoemulsification with intraocular lens placement; PRK photorefractive keratectomy; LASIK laser assisted in situ keratomileusis; HTN hypertension; DM diabetes mellitus; COPD chronic obstructive pulmonary disease

## 2021-02-27 ENCOUNTER — Encounter (INDEPENDENT_AMBULATORY_CARE_PROVIDER_SITE_OTHER): Payer: Self-pay | Admitting: Ophthalmology

## 2021-02-27 ENCOUNTER — Other Ambulatory Visit: Payer: Self-pay

## 2021-02-27 ENCOUNTER — Ambulatory Visit (INDEPENDENT_AMBULATORY_CARE_PROVIDER_SITE_OTHER): Payer: PPO | Admitting: Ophthalmology

## 2021-02-27 DIAGNOSIS — H43813 Vitreous degeneration, bilateral: Secondary | ICD-10-CM | POA: Diagnosis not present

## 2021-02-27 DIAGNOSIS — H35033 Hypertensive retinopathy, bilateral: Secondary | ICD-10-CM

## 2021-02-27 DIAGNOSIS — H4312 Vitreous hemorrhage, left eye: Secondary | ICD-10-CM | POA: Diagnosis not present

## 2021-02-27 DIAGNOSIS — Z961 Presence of intraocular lens: Secondary | ICD-10-CM

## 2021-02-27 DIAGNOSIS — H3581 Retinal edema: Secondary | ICD-10-CM

## 2021-02-27 DIAGNOSIS — H442E3 Degenerative myopia with other maculopathy, bilateral eye: Secondary | ICD-10-CM

## 2021-02-27 DIAGNOSIS — H5213 Myopia, bilateral: Secondary | ICD-10-CM

## 2021-02-27 DIAGNOSIS — I10 Essential (primary) hypertension: Secondary | ICD-10-CM

## 2021-02-27 MED ORDER — BEVACIZUMAB CHEMO INJECTION 1.25MG/0.05ML SYRINGE FOR KALEIDOSCOPE
1.2500 mg | INTRAVITREAL | Status: AC | PRN
  Administered 2021-02-27: 1.25 mg via INTRAVITREAL

## 2021-03-13 DIAGNOSIS — K5732 Diverticulitis of large intestine without perforation or abscess without bleeding: Secondary | ICD-10-CM | POA: Diagnosis not present

## 2021-03-13 DIAGNOSIS — R531 Weakness: Secondary | ICD-10-CM | POA: Diagnosis not present

## 2021-03-13 DIAGNOSIS — A419 Sepsis, unspecified organism: Secondary | ICD-10-CM | POA: Diagnosis not present

## 2021-03-13 DIAGNOSIS — Q0700 Arnold-Chiari syndrome without spina bifida or hydrocephalus: Secondary | ICD-10-CM | POA: Diagnosis not present

## 2021-03-19 NOTE — Progress Notes (Signed)
[facility] - Clinic Note  03/20/2021     CHIEF COMPLAINT Patient presents for Retina Follow Up   HISTORY OF PRESENT ILLNESS: Angela Ramirez is a 75 y.o. female who presents to the clinic today for:  HPI     Retina Follow Up   Patient presents with  Other.  In left eye.  This started days ago.  Severity is moderate.  Duration of weeks.  Since onset it is gradually improving.  I, the attending physician,  performed the HPI with the patient and updated documentation appropriately.        Comments   Pt states vision is getting better OS.  Pt denies eye pain or discomfort and denies any new or worsening floaters or fol OU.      Last edited by Rennis Chris, MD on 03/20/2021  9:26 AM.     Pt states vision is stable, she did housework this weekend that did not make her floaters any worse   Referring physician: Tyrone Schimke, MD No address on file  HISTORICAL INFORMATION:   Selected notes from the MEDICAL RECORD NUMBER Originally referred by Dr. [name] for retina clearance for cat sx. Re-referred on 6.28.22 for new onset VH OS   CURRENT MEDICATIONS: No current outpatient medications on file. (Ophthalmic Drugs)   No current facility-administered medications for this visit. (Ophthalmic Drugs)   Current Outpatient Medications (Other)  Medication Sig   Acetaminophen-Caffeine 500-65 MG TABS Take 1 tablet by mouth every 6 (six) hours as needed (headache). Tension relief   amLODipine (NORVASC) 10 MG tablet Take 1 tablet (10 mg total) by mouth daily. (Patient taking differently: Take 5 mg by mouth every evening.)   hydrochlorothiazide (HYDRODIURIL) 25 MG tablet Take 25 mg by mouth daily.   linaclotide (LINZESS) 72 MCG capsule TAKE 1 CAPSULE(72 MCG) BY MOUTH DAILY BEFORE BREAKFAST   polyethylene glycol powder (GLYCOLAX/MIRALAX) 17 GM/SCOOP powder Dissolve 1 capful in 8 ounces of water and drink as needed for constipation   temazepam (RESTORIL) 15 MG  capsule Take 1 capsule (15 mg total) by mouth 2 (two) times daily as needed for sleep (anxiety).   traMADol (ULTRAM) 50 MG tablet Take 1 tablet (50 mg total) by mouth daily as needed.   No current facility-administered medications for this visit. (Other)   REVIEW OF SYSTEMS: ROS   Positive for: Gastrointestinal, Neurological, Eyes, Psychiatric Negative for: Constitutional, Skin, Genitourinary, Musculoskeletal, HENT, Endocrine, Cardiovascular, Respiratory, Allergic/Imm, Heme/Lymph Last edited by Corrinne Eagle on 03/20/2021  9:07 AM.       ALLERGIES Allergies  Allergen Reactions   Crab (Diagnostic) Hives   Morphine And Related Nausea And Vomiting   Sulfa Antibiotics Nausea And Vomiting    PAST MEDICAL HISTORY Past Medical History:  Diagnosis Date   Anxiety    Aortic atherosclerosis (HCC)    Arnold-Chiari malformation (HCC)    Arthritis    Cirrhosis (HCC)    Colon polyps    Depression    Diverticulitis    Diverticulosis    Emphysema of lung (HCC) 12/02/2020   per CT scan   Headache    Hepatitis C    Hiatal hernia    Hypertension    Hypertensive retinopathy    IBS (irritable bowel syndrome)    Internal hemorrhoids    Sepsis (HCC) 11/2020   UTI (urinary tract infection)    Past Surgical History:  Procedure Laterality Date   BRAIN SURGERY  2003   decompression   BREAST EXCISIONAL BIOPSY  Bilateral    age 79's   CATARACT EXTRACTION     DILATION AND CURETTAGE OF UTERUS     EYE SURGERY     OVARIAN CYST REMOVAL Bilateral    REDUCTION MAMMAPLASTY Bilateral    25+ years ago    FAMILY HISTORY Family History  Problem Relation Age of Onset   Arthritis Mother    Hypertension Mother    Kidney disease Mother    Diabetes Mother    Alcohol abuse Father    Arthritis Father    Lung cancer Father    Hypertension Sister    Diabetes Maternal Grandmother    Breast cancer Paternal Grandmother    Parkinson's disease Son    Hypertension Daughter    Diabetes Maternal  Aunt    Kidney failure Maternal Aunt    Diabetes Maternal Uncle    Rheum arthritis Sister    Breast cancer Paternal Aunt    Breast cancer Paternal Aunt     SOCIAL HISTORY Social History   Tobacco Use   Smoking status: Former    Types: Cigarettes    Quit date: 09/29/1969    Years since quitting: 51.5   Smokeless tobacco: Never  Vaping Use   Vaping Use: Never used  Substance Use Topics   Alcohol use: Yes    Alcohol/week: 0.0 standard drinks    Comment: 2 glasses of wine a week   Drug use: No         OPHTHALMIC EXAM:  Base Eye Exam     Visual Acuity (Snellen - Linear)       Right Left   Dist [place] 20/20 - 20/50 -   Dist ph [place]  20/20 -         Tonometry (Tonopen, 9:12 AM)       Right Left   Pressure 16 17         Pupils       Dark Light Shape React APD   Right 3 2 Round Brisk 0   Left 3 2 Round Brisk 0         Visual Fields       Left Right    Full Full         Extraocular Movement       Right Left    Full Full         Neuro/Psych     Oriented x3: Yes   Mood/Affect: Normal         Dilation     Both eyes: 1.0% Mydriacyl, 2.5% Phenylephrine @ 9:12 AM           Slit Lamp and Fundus Exam     Slit Lamp Exam       Right Left   Lids/Lashes Dermatochalasis - upper lid, mild Meibomian gland dysfunction Dermatochalasis - upper lid, mild Meibomian gland dysfunction   Conjunctiva/Sclera White and quiet White and quiet   Cornea arcus, trace Punctate epithelial erosions, well healed temporal cataract wounds mild arcus, trace Punctate epithelial erosions   Anterior Chamber deep, narrow temporal angle deep, narrow temporal angle   Iris Round and dilated Round and dilated   Lens Toric PC IOL in good position marks at 530 and 1130, trace PCO PC IOL in good position   Vitreous Vitreous syneresis, Posterior vitreous detachment, vitreous condensations Vitreous syneresis, Posterior vitreous detachment, vitreous condensations, blood stained  vitreous condensations clearing centrally and settling inferiorly--white         Fundus Exam  Right Left   Disc Tilted disc, Pink and Sharp, +cupping, +PPA/PPP Compact, mild Pallor, severe tilt, 360 PPA -- broad, large inter retinal and pre-retinal heme nasal to disc improved, now just focal [facility]    C/D Ratio 0.75 0.4   Macula Flat, Blunted foveal reflex, RPE mottling and clumping, No heme or edema posterior staphyloma/CR atrophy inferior macula, Blunted foveal reflex, RPE mottling, clumping and atrophy, No heme, +myopic degeneration   Vessels attenuated, mild tortuousity, mild AV crossing changes attenuated   Periphery Attached, peripheral cystoid degeneration most prominent ST periphery; no RT/RD, blonde fundus Attached; no RT/RD; peripheral cystoid degen, paving stone degeneration inferiorly            IMAGING AND PROCEDURES  Imaging and Procedures for 03/20/2021  OCT, Retina - OU - Both Eyes       Right Eye Quality was good. Central Foveal Thickness: 256. Progression has been stable. Findings include normal foveal contour, no IRF, no SRF, myopic contour.   Left Eye Quality was good. Central Foveal Thickness: 310. Progression has improved. Findings include myopic contour, intraretinal fluid, outer retinal atrophy (Interval improvement in vitreous opacities; persistent cystic changes nasal macula).   Notes *Images captured and stored on drive  Diagnosis / Impression:  OD: NFP, no IRF/SRF; Myopic contour OS: Mild persistent vitreous opacities; persistent cystic changes nasal macula  Clinical management:  See below  Abbreviations: NFP - Normal foveal profile. CME - cystoid macular edema. PED - pigment epithelial detachment. IRF - intraretinal fluid. SRF - subretinal fluid. EZ - ellipsoid zone. ERM - epiretinal membrane. ORA - outer retinal atrophy. ORT - outer retinal tubulation. SRHM - subretinal hyper-reflective material. IRHM - intraretinal hyper-reflective  material            ASSESSMENT/PLAN:    ICD-10-CM   1. Vitreous hemorrhage of left eye (HCC)  H43.12     2. 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 Severe myopia of both eyes  H52.13     4. Retinal edema  H35.81 OCT, Retina - OU - Both Eyes    5. Posterior vitreous detachment of both eyes  H43.813     6. Essential hypertension  I10     7. Hypertensive retinopathy of both eyes  H35.033     8. Pseudophakia, both eyes  Z96.1      Vitreous Hemorrhage OS - pt hospitalized on 06.18.22 for diverticulitis that became sepsis - vision loss first noted ~06.22.22 after coming off vent in ICU - b-scan 6.29.22 shows vitreous opacities consistent w/ VH, no obvious RT/RD or mass OS - s/p IVA OS #1 (06.29.22), #2 (07.27.22), #3 (09.06.22) - today, VH improving and nasal peripapillary heme improving OS - BCVA is 20/20 - FA (07.27.22) shows mild staining / window defect + blockage OU -- no CNV - OCT shows mild interval improvement in vitreous opacities - IVA informed consent obtained and signed, 06.29.22 (OS) - VH precautions reviewed -- minimize activities, keep head elevated, avoid ASA/NSAIDs/blood thinners as able - f/u 5 weeks, DFE, check nasal peripapillary heme, OCT, possible injection  2-4. Myopic degeneration OU (OS>OD)  - OS with posterior staphyloma / CR atrophy inf macula  - no CNV OU -- confirmed on FA today, 7.27.22   - OCT OS shows severe myopic contour with posterior staphyloma and CR atrophy, mild ERM, stable cystic changes nasal to staphyloma, no SRF  - no RT/RD on peripheral exam  - monitor  - f/u  in 1 year DFE, OCT  5. PVD / vitreous syneresis OU  - asymptomatic  - Discussed findings and prognosis  - No RT or RD on 360 peripheral exam  - Reviewed s/s of RT/RD  - Strict return precautions for any such RT/RD symptoms  6,7. Hypertensive retinopathy OU - discussed importance of tight BP control - monitor  8. Pseudophakia  OU  - s/p CE/IOL OU (Dr. [name])  - IOLs in good position, doing well - monitor  Ophthalmic Meds Ordered this visit:  No orders of the defined types were placed in this encounter.      Return in about 5 weeks (around 04/24/2021) for f/u VH OS, DFE, OCT.  There are no Patient Instructions on file for this visit.  This document serves as a record of services personally performed by Karie Chimera, MD, PhD. It was created on their behalf by Cristopher Estimable, COT an ophthalmic technician. The creation of this record is the provider's dictation and/or activities during the visit.    Electronically signed by: Cristopher Estimable, COT 9.26.22 @ 1:17 AM   This document serves as a record of services personally performed by Karie Chimera, MD, PhD. It was created on their behalf by Glee Arvin. Manson Passey, OA an ophthalmic technician. The creation of this record is the provider's dictation and/or activities during the visit.    Electronically signed by: Glee Arvin. Manson Passey, [place] 09.27.2022 1:17 AM  Karie Chimera, M.D., Ph.D. Diseases & Surgery of the Retina and Vitreous [facility]  I have reviewed the above documentation for accuracy and completeness, and I agree with the above. Karie Chimera, M.D., Ph.D. 03/22/21 1:18 AM   Abbreviations: M myopia (nearsighted); A astigmatism; H hyperopia (farsighted); P presbyopia; Mrx spectacle prescription;  CTL contact lenses; OD right eye; OS left eye; OU both eyes  XT exotropia; ET esotropia; PEK punctate epithelial keratitis; PEE punctate epithelial erosions; DES dry eye syndrome; MGD meibomian gland dysfunction; ATs artificial tears; PFAT's preservative free artificial tears; NSC nuclear sclerotic cataract; PSC posterior subcapsular cataract; ERM epi-retinal membrane; PVD posterior vitreous detachment; RD retinal detachment; DM diabetes mellitus; DR diabetic retinopathy; NPDR non-proliferative diabetic retinopathy; PDR proliferative diabetic  retinopathy; CSME clinically significant macular edema; DME diabetic macular edema; dbh dot blot hemorrhages; CWS cotton wool spot; POAG primary open angle glaucoma; C/D cup-to-disc ratio; HVF humphrey visual field; GVF goldmann visual field; OCT optical coherence tomography; IOP intraocular pressure; BRVO Branch retinal vein occlusion; CRVO central retinal vein occlusion; CRAO central retinal artery occlusion; BRAO branch retinal artery occlusion; RT retinal tear; SB scleral buckle; PPV pars plana vitrectomy; VH Vitreous hemorrhage; PRP panretinal laser photocoagulation; IVK intravitreal kenalog; VMT vitreomacular traction; MH Macular hole;  NVD neovascularization of the disc; NVE neovascularization elsewhere; AREDS age related eye disease study; ARMD age related macular degeneration; POAG primary open angle glaucoma; EBMD epithelial/anterior basement membrane dystrophy; ACIOL anterior chamber intraocular lens; IOL intraocular lens; PCIOL posterior chamber intraocular lens; Phaco/IOL phacoemulsification with intraocular lens placement; PRK photorefractive keratectomy; LASIK laser assisted in situ keratomileusis; HTN hypertension; DM diabetes mellitus; COPD chronic obstructive pulmonary disease

## 2021-03-20 ENCOUNTER — Other Ambulatory Visit: Payer: Self-pay

## 2021-03-20 ENCOUNTER — Encounter (INDEPENDENT_AMBULATORY_CARE_PROVIDER_SITE_OTHER): Payer: Self-pay | Admitting: Ophthalmology

## 2021-03-20 ENCOUNTER — Ambulatory Visit (INDEPENDENT_AMBULATORY_CARE_PROVIDER_SITE_OTHER): Payer: PPO | Admitting: Ophthalmology

## 2021-03-20 DIAGNOSIS — H442E3 Degenerative myopia with other maculopathy, bilateral eye: Secondary | ICD-10-CM

## 2021-03-20 DIAGNOSIS — H4312 Vitreous hemorrhage, left eye: Secondary | ICD-10-CM | POA: Diagnosis not present

## 2021-03-20 DIAGNOSIS — I10 Essential (primary) hypertension: Secondary | ICD-10-CM | POA: Diagnosis not present

## 2021-03-20 DIAGNOSIS — Z961 Presence of intraocular lens: Secondary | ICD-10-CM

## 2021-03-20 DIAGNOSIS — H35033 Hypertensive retinopathy, bilateral: Secondary | ICD-10-CM | POA: Diagnosis not present

## 2021-03-20 DIAGNOSIS — H43813 Vitreous degeneration, bilateral: Secondary | ICD-10-CM | POA: Diagnosis not present

## 2021-03-20 DIAGNOSIS — H5213 Myopia, bilateral: Secondary | ICD-10-CM

## 2021-03-20 DIAGNOSIS — H3581 Retinal edema: Secondary | ICD-10-CM

## 2021-04-02 ENCOUNTER — Ambulatory Visit: Payer: PPO | Admitting: Internal Medicine

## 2021-04-02 ENCOUNTER — Encounter: Payer: Self-pay | Admitting: Internal Medicine

## 2021-04-02 VITALS — BP 170/80 | HR 58 | Ht 66.5 in | Wt 200.0 lb

## 2021-04-02 DIAGNOSIS — Z8719 Personal history of other diseases of the digestive system: Secondary | ICD-10-CM | POA: Diagnosis not present

## 2021-04-02 DIAGNOSIS — K7460 Unspecified cirrhosis of liver: Secondary | ICD-10-CM

## 2021-04-02 DIAGNOSIS — K588 Other irritable bowel syndrome: Secondary | ICD-10-CM | POA: Diagnosis not present

## 2021-04-02 DIAGNOSIS — K5909 Other constipation: Secondary | ICD-10-CM | POA: Diagnosis not present

## 2021-04-02 MED ORDER — LINACLOTIDE 145 MCG PO CAPS: 145.0000 ug | ORAL_CAPSULE | Freq: Every day | ORAL | 1 refills | Status: DC

## 2021-04-02 NOTE — Patient Instructions (Addendum)
Please follow up with Dr Hilarie Fredrickson in 6 months.  Your provider has requested that you go to the basement level for lab work on 05/24/21. Press "B" on the elevator. The lab is located at the first door on the left as you exit the elevator. (CBC, CMP, INR)  We have sent the following medications to your pharmacy for you to pick up at your convenience: Linzess 145 mcg daily  You have been scheduled for a CT scan of the abdomen and pelvis at [facility] (1st floor of hospital)  You are scheduled on Wednesday 05/30/21 at 10:00 am. You should arrive 15 minutes prior to your appointment time for registration. Please follow the written instructions below on the day of your exam:  WARNING: IF YOU ARE ALLERGIC TO IODINE/X-RAY DYE, PLEASE NOTIFY RADIOLOGY IMMEDIATELY AT [place].  1) Do not eat or drink anything after 6:00 am (4 hours prior to your test) 2) You have been given 2 bottles of oral contrast to drink. The solution may taste better if refrigerated, but do NOT add ice or any other liquid to this solution. Shake well before drinking.    Drink 1 bottle of contrast @ 8:00 am (2 hours prior to your exam)  Drink 1 bottle of contrast @ 9:00 am (1 hour prior to your exam)  You may take any medications as prescribed with a small amount of water, if necessary. If you take any of the following medications: METFORMIN, GLUCOPHAGE, GLUCOVANCE, AVANDAMET, RIOMET, FORTAMET, [place] MET, JANUMET, GLUMETZA or METAGLIP, you MAY be asked to HOLD this medication 48 hours AFTER the exam.  The purpose of you drinking the oral contrast is to aid in the visualization of your intestinal tract. The contrast solution may cause some diarrhea. Depending on your individual set of symptoms, you may also receive an intravenous injection of x-ray contrast/dye. Plan on being at [facility] for 30 minutes or longer, depending on the type of exam you are having  performed.  This test typically takes 30-45 minutes to complete.  If you have any questions regarding your exam or if you need to reschedule, you may call the CT department at [phone] between the hours of 8:00 am and 5:00 pm, Monday-Friday.  ________________________________________________________

## 2021-04-02 NOTE — Progress Notes (Signed)
Subjective:    Patient ID: Angela Ramirez, female    DOB: 09/07/1945, 75 y.o.   MRN: [id number]  HPI Angela Ramirez is a 75 year old female with a history of acute diverticulitis with sepsis complicated by acute hypoxic respiratory failure, AKI and metabolic encephalopathy requiring ICU care in June 2022, chronic constipation, hepatitis C cirrhosis, successfully eradicated hepatitis C who is here for follow-up.  She is here today with her daughter-in-law.  She was last seen on 01/02/2021 by Mike Gip.  She reports that she continues to make slow but definite improvement.  She has more stamina and is able to be more active.  Still stiffness with getting up but overall much less fatigue.  She will have small on and off episodes of left lower quadrant tenderness but not pain.  Some food sensitivity as salads seem to make her more constipated and more heavy in her left lower quadrant.  Bowel movements are better but overall smaller in size and easier to pass.  She is taking Linzess 72 mcg daily but even with this at times still feels constipated and can skip days without bowel movement.  She has been strictly avoiding popcorn, nuts and certain foods with seeds such as cucumbers.  No jaundice, itching, or edema.   Review of Systems As per HPI, otherwise negative  Current Medications, Allergies, Past Medical History, Past Surgical History, Family History and Social History were reviewed in [employer] record.    Objective:   Physical Exam BP (!) 170/80   Pulse (!) 58   Ht 5' 6.5" (1.689 m)   Wt 200 lb (90.7 kg)   BMI 31.80 kg/m  Gen: awake, alert, NAD HEENT: anicteric,  CV: RRR, no mrg Pulm: CTA b/l Abd: soft, mild tenderness to the left lower quadrant without rebound or guarding, ND, +BS throughout Ext: no c/c/e Neuro: nonfocal  CBC    Component Value Date/Time   WBC 8.4 01/02/2021 1552   RBC 4.37 01/02/2021 1552   HGB 12.6 01/02/2021 1552   HCT 37.6  01/02/2021 1552   PLT 379.0 01/02/2021 1552   MCV 85.9 01/02/2021 1552   MCH 27.7 12/11/2020 0019   MCHC 33.5 01/02/2021 1552   RDW 17.7 (H) 01/02/2021 1552   LYMPHSABS 2.2 01/02/2021 1552   MONOABS 0.4 01/02/2021 1552   EOSABS 0.1 01/02/2021 1552   BASOSABS 0.1 01/02/2021 1552   CMP     Component Value Date/Time   NA 138 01/02/2021 1552   K 3.8 01/02/2021 1552   CL 99 01/02/2021 1552   CO2 28 01/02/2021 1552   GLUCOSE 108 (H) 01/02/2021 1552   BUN 11 01/02/2021 1552   CREATININE 0.78 01/02/2021 1552   CREATININE 0.80 04/09/2018 1011   CALCIUM 10.3 01/02/2021 1552   PROT 9.0 (H) 01/02/2021 1552   ALBUMIN 4.4 01/02/2021 1552   AST 25 01/02/2021 1552   ALT 16 01/02/2021 1552   ALT 25 09/17/2017 1435   ALKPHOS 132 (H) 01/02/2021 1552   BILITOT 0.4 01/02/2021 1552   GFRNONAA >60 12/11/2020 0019   GFRNONAA 75 04/09/2018 1011   GFRAA 87 04/09/2018 1011   Lab Results  Component Value Date   INR 1.0 01/02/2021   INR 1.6 (H) 12/04/2020   INR 1.5 (H) 12/03/2020       Assessment & Plan:  75 year old female with a history of acute diverticulitis with sepsis complicated by acute hypoxic respiratory failure, AKI and metabolic encephalopathy requiring ICU care in June 2022, chronic constipation, hepatitis  C cirrhosis, successfully eradicated hepatitis C who is here for follow-up.   Complicated diverticulitis --she has recovered very well after her ICU hospitalization in June.  We discussed slow liberalization of diet though I have recommended she avoid nuts and popcorn.  She had colonoscopy recently just before her diagnosis in May 2021.  She is reminded to call me if she develops worsening left lower abdominal pain. --Repeat CT scan abdomen pelvis with contrast December 2022, 6 months from last --CBC, CMP and INR at that time  2.  IBS with constipation --still having some constipation likely exacerbating her left-sided abdominal pain and bloating symptom despite Linzess 72 mcg  daily --Increase Linzess to 145 mcg daily  3.  Compensated hepatitis C cirrhosis --successfully treated hepatitis C with SVR.  No evidence of esophageal varices on EGD in May 2021. --CBC, CMP and INR in December --Consider repeat EGD May 2023 for screening --HCC screening up-to-date and CT abdomen pelvis as planned above; HCC screening is recommended every 6 months if by ultrasound and every 12 months if by cross-sectional imaging with IV contrast --Alcohol avoidance  73-month follow-up with me, sooner if needed  30 minutes total spent today including patient facing time, coordination of care, reviewing medical history/procedures/pertinent radiology studies, and documentation of the encounter.

## 2021-04-11 ENCOUNTER — Ambulatory Visit (INDEPENDENT_AMBULATORY_CARE_PROVIDER_SITE_OTHER): Payer: PPO | Admitting: Internal Medicine

## 2021-04-11 ENCOUNTER — Encounter: Payer: Self-pay | Admitting: Internal Medicine

## 2021-04-11 ENCOUNTER — Other Ambulatory Visit: Payer: Self-pay

## 2021-04-11 DIAGNOSIS — R0602 Shortness of breath: Secondary | ICD-10-CM | POA: Diagnosis not present

## 2021-04-11 DIAGNOSIS — R531 Weakness: Secondary | ICD-10-CM

## 2021-04-11 DIAGNOSIS — F5104 Psychophysiologic insomnia: Secondary | ICD-10-CM | POA: Diagnosis not present

## 2021-04-11 DIAGNOSIS — K5900 Constipation, unspecified: Secondary | ICD-10-CM

## 2021-04-11 MED ORDER — TEMAZEPAM 15 MG PO CAPS: 15.0000 mg | ORAL_CAPSULE | Freq: Two times a day (BID) | ORAL | 2 refills | Status: DC | PRN

## 2021-04-11 MED ORDER — TRAMADOL HCL 50 MG PO TABS: 50.0000 mg | ORAL_TABLET | Freq: Every day | ORAL | 2 refills | Status: DC | PRN

## 2021-04-11 MED ORDER — CYCLOBENZAPRINE HCL 5 MG PO TABS: 5.0000 mg | ORAL_TABLET | Freq: Every day | ORAL | 1 refills | Status: DC | PRN

## 2021-04-11 NOTE — Assessment & Plan Note (Signed)
Overall significantly improved. Some new tendonitis in the achilles region. Given stretching exercises and rx for flexeril for muscle pain.

## 2021-04-11 NOTE — Assessment & Plan Note (Signed)
Doing better with change in dose of linzess by GI. Advised to continue.

## 2021-04-11 NOTE — Assessment & Plan Note (Signed)
Refilled temazepam which she uses rarely for #60 2 refills. Last filled July 2022

## 2021-04-11 NOTE — Assessment & Plan Note (Signed)
New problem, lungs clear. Advised to use inhaler she has at home and let us know if not improving.

## 2021-04-11 NOTE — Patient Instructions (Addendum)
We have sent in flexeril to take if needed for muscle relaxer.

## 2021-04-11 NOTE — Progress Notes (Signed)
   Subjective:   Patient ID: Angela Ramirez, female    DOB: [birth year], 75 y.o.   MRN: [id number]  Shortness of Breath Pertinent negatives include no abdominal pain, chest pain, leg swelling or vomiting.  The patient is a 75 YO female coming in for follow up.  Review of Systems  Constitutional:  Positive for activity change.  HENT: Negative.    Eyes: Negative.   Respiratory:  Positive for shortness of breath. Negative for cough and chest tightness.   Cardiovascular:  Negative for chest pain, palpitations and leg swelling.  Gastrointestinal:  Negative for abdominal distention, abdominal pain, constipation, diarrhea, nausea and vomiting.  Musculoskeletal:  Positive for myalgias.  Skin: Negative.   Neurological: Negative.   Psychiatric/Behavioral: Negative.     Objective:  Physical Exam Constitutional:      Appearance: She is well-developed.  HENT:     Head: Normocephalic and atraumatic.  Cardiovascular:     Rate and Rhythm: Normal rate and regular rhythm.  Pulmonary:     Effort: Pulmonary effort is normal. No respiratory distress.     Breath sounds: Normal breath sounds. No wheezing or rales.  Abdominal:     General: Bowel sounds are normal. There is no distension.     Palpations: Abdomen is soft.     Tenderness: There is no abdominal tenderness. There is no rebound.  Musculoskeletal:     Cervical back: Normal range of motion.     Left lower leg: Tenderness present.  Skin:    General: Skin is warm and dry.  Neurological:     Mental Status: She is alert and oriented to person, place, and time.     Coordination: Coordination normal.    Vitals:   04/11/21 1031  BP: 130/80  Pulse: 83  Resp: 18  SpO2: 98%  Weight: 200 lb 3.2 oz (90.8 kg)  Height: 5' 6.5" (1.689 m)    This visit occurred during the SARS-CoV-2 public health emergency.  Safety protocols were in place, including screening questions prior to the visit, additional usage of staff PPE, and extensive cleaning of  exam room while observing appropriate contact time as indicated for disinfecting solutions.   Assessment & Plan:  Flu shot given at visit

## 2021-04-12 DIAGNOSIS — K5732 Diverticulitis of large intestine without perforation or abscess without bleeding: Secondary | ICD-10-CM | POA: Diagnosis not present

## 2021-04-12 DIAGNOSIS — R531 Weakness: Secondary | ICD-10-CM | POA: Diagnosis not present

## 2021-04-12 DIAGNOSIS — A419 Sepsis, unspecified organism: Secondary | ICD-10-CM | POA: Diagnosis not present

## 2021-04-12 DIAGNOSIS — Q0700 Arnold-Chiari syndrome without spina bifida or hydrocephalus: Secondary | ICD-10-CM | POA: Diagnosis not present

## 2021-04-19 NOTE — Progress Notes (Addendum)
[facility] Note  04/24/2021    CHIEF COMPLAINT Patient presents for Retina Follow Up   HISTORY OF PRESENT ILLNESS: Angela Ramirez is a 75 y.o. female who presents to the clinic today for:  HPI     Retina Follow Up   Patient presents with  Other.  In left eye.  Severity is mild.  Duration of 5 weeks.  Since onset it is stable.  I, the attending physician,  performed the HPI with the patient and updated documentation appropriately.        Comments   Pt here for 5 wk ret f/u for VH OS. Pt states vision is doing okay. She does note that the dark strings at the top of OS have gotten lighter. Some double vision in OS when shes driving.       Last edited by Bernarda Caffey, MD on 04/24/2021 12:50 PM.    Pt states floaters are getting better, she only has a few in her superior vision  Referring physician: Vevelyn Royals, MD No address on file  HISTORICAL INFORMATION:   Selected notes from the [facility] Originally referred by Dr. Lucita Ferrara for retina clearance for cat sx. Re-referred on 6.28.22 for new onset VH OS   CURRENT MEDICATIONS: No current outpatient medications on file. (Ophthalmic Drugs)   No current facility-administered medications for this visit. (Ophthalmic Drugs)   Current Outpatient Medications (Other)  Medication Sig   Acetaminophen-Caffeine 500-65 MG TABS Take 1 tablet by mouth every 6 (six) hours as needed (headache). Tension relief   amLODipine (NORVASC) 10 MG tablet Take 1 tablet (10 mg total) by mouth daily. (Patient taking differently: Take 5 mg by mouth every evening.)   cyclobenzaprine (FLEXERIL) 5 MG tablet Take 1 tablet (5 mg total) by mouth daily as needed for muscle spasms.   hydrochlorothiazide (HYDRODIURIL) 25 MG tablet Take 25 mg by mouth daily.   linaclotide (LINZESS) 145 MCG CAPS capsule Take 1 capsule (145 mcg total) by mouth daily before breakfast.   polyethylene glycol powder (GLYCOLAX/MIRALAX) 17  GM/SCOOP powder Dissolve 1 capful in 8 ounces of water and drink as needed for constipation   temazepam (RESTORIL) 15 MG capsule Take 1 capsule (15 mg total) by mouth 2 (two) times daily as needed for sleep (anxiety).   traMADol (ULTRAM) 50 MG tablet Take 1 tablet (50 mg total) by mouth daily as needed.   No current facility-administered medications for this visit. (Other)   REVIEW OF SYSTEMS: ROS   Positive for: Gastrointestinal, Neurological, Eyes, Psychiatric Negative for: Constitutional, Skin, Genitourinary, Musculoskeletal, HENT, Endocrine, Cardiovascular, Respiratory, Allergic/Imm, Heme/Lymph Last edited by Kingsley Spittle, COT on 04/24/2021  9:04 AM.     ALLERGIES Allergies  Allergen Reactions   Crab (Diagnostic) Hives   Morphine And Related Nausea And Vomiting   Sulfa Antibiotics Nausea And Vomiting   PAST MEDICAL HISTORY Past Medical History:  Diagnosis Date   Anxiety    Aortic atherosclerosis ([place])    Arnold-Chiari malformation (HCC)    Arthritis    Cirrhosis ([place])    Colon polyps    Depression    Diverticulitis    Diverticulosis    Emphysema of lung ([place]) 12/02/2020   per CT scan   Headache    Hepatitis C    Hiatal hernia    Hypertension    Hypertensive retinopathy    IBS (irritable bowel syndrome)    Internal hemorrhoids    Sepsis ([place]) 11/2020   UTI (urinary  tract infection)    Past Surgical History:  Procedure Laterality Date   BRAIN SURGERY  2003   decompression   BREAST EXCISIONAL BIOPSY Bilateral    age 23's   CATARACT EXTRACTION     DILATION AND CURETTAGE OF UTERUS     EYE SURGERY     OVARIAN CYST REMOVAL Bilateral    REDUCTION MAMMAPLASTY Bilateral    25+ years ago    FAMILY HISTORY Family History  Problem Relation Age of Onset   Arthritis Mother    Hypertension Mother    Kidney disease Mother    Diabetes Mother    Alcohol abuse Father    Arthritis Father    Lung cancer Father    Hypertension Sister    Diabetes Maternal  Grandmother    Breast cancer Paternal Grandmother    Parkinson's disease Son    Hypertension Daughter    Diabetes Maternal Aunt    Kidney failure Maternal Aunt    Diabetes Maternal Uncle    Rheum arthritis Sister    Breast cancer Paternal Aunt    Breast cancer Paternal Aunt     SOCIAL HISTORY Social History   Tobacco Use   Smoking status: Former    Types: Cigarettes    Quit date: 09/29/1969    Years since quitting: 51.6   Smokeless tobacco: Never  Vaping Use   Vaping Use: Never used  Substance Use Topics   Alcohol use: Yes    Alcohol/week: 0.0 standard drinks    Comment: 2 glasses of wine a week   Drug use: No       OPHTHALMIC EXAM: Base Eye Exam     Visual Acuity (Snellen - Linear)       Right Left   Dist [place] 20/30 +1 20/50   Dist ph [place] 20/25 +2 20/20 -1  Reported double vision during acuity OS        Tonometry (Tonopen, 9:15 AM)       Right Left   Pressure 14 17         Pupils       Dark Light Shape React APD   Right 4 3 Round Brisk None   Left 4 3 Round Brisk None         Visual Fields (Counting fingers)       Left Right    Full Full         Extraocular Movement       Right Left    Full, Ortho Full, Ortho         Neuro/Psych     Oriented x3: Yes   Mood/Affect: Normal         Dilation     Both eyes: 1.0% Mydriacyl, 2.5% Phenylephrine @ 9:16 AM           Slit Lamp and Fundus Exam     Slit Lamp Exam       Right Left   Lids/Lashes Dermatochalasis - upper lid, mild Meibomian gland dysfunction Dermatochalasis - upper lid, mild Meibomian gland dysfunction   Conjunctiva/Sclera White and quiet White and quiet   Cornea arcus, trace Punctate epithelial erosions, well healed temporal cataract wounds mild arcus, trace Punctate epithelial erosions   Anterior Chamber deep and clear, no cell or flare deep and clear, no cell or flare   Iris Round and dilated Round and dilated   Lens Toric PC IOL in good position marks at 530 and  1130, trace PCO PC IOL in good position  Vitreous Vitreous syneresis, Posterior vitreous detachment, vitreous condensations Vitreous syneresis, Posterior vitreous detachment, vitreous condensations; white blood stained vitreous condensations cleared centrally and settled inferiorly -- improved         Fundus Exam       Right Left   Disc Tilted disc, Pink and Sharp, +cupping, +PPA/PPP Compact, mild Pallor, severe tilt, 360 PPA -- broad, large inter retinal and pre-retinal heme nasal to disc improved - resolved   C/D Ratio 0.75 0.4   Macula Flat, Blunted foveal reflex, RPE mottling and clumping, No heme or edema posterior staphyloma/CR atrophy inferior macula, Blunted foveal reflex, RPE mottling, clumping and atrophy, No heme, +myopic degeneration   Vessels attenuated, mild tortuousity, mild AV crossing changes attenuated   Periphery Attached, peripheral cystoid degeneration most prominent ST periphery; no RT/RD, blonde fundus Attached; no RT/RD; peripheral cystoid degen, paving stone degeneration inferiorly           IMAGING AND PROCEDURES  Imaging and Procedures for 04/24/2021  OCT, Retina - OU - Both Eyes       Right Eye Quality was good. Central Foveal Thickness: 257. Progression has been stable. Findings include normal foveal contour, no IRF, no SRF, myopic contour.   Left Eye Quality was good. Central Foveal Thickness: 320. Progression has been stable. Findings include myopic contour, intraretinal fluid, outer retinal atrophy (stable improvement in vitreous opacities; persistent cystic changes nasal macula).   Notes *Images captured and stored on drive  Diagnosis / Impression:  OD: NFP, no IRF/SRF; Myopic contour OS: stable improvement in vitreous opacities; persistent cystic changes nasal macula  Clinical management:  See below  Abbreviations: NFP - Normal foveal profile. CME - cystoid macular edema. PED - pigment epithelial detachment. IRF - intraretinal fluid. SRF -  subretinal fluid. EZ - ellipsoid zone. ERM - epiretinal membrane. ORA - outer retinal atrophy. ORT - outer retinal tubulation. SRHM - subretinal hyper-reflective material. IRHM - intraretinal hyper-reflective material            ASSESSMENT/PLAN:    ICD-10-CM   1. Vitreous hemorrhage of left eye (HCC)  H43.12     2. 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 Severe myopia of both eyes  H52.13     4. Retinal edema  H35.81 OCT, Retina - OU - Both Eyes    5. Posterior vitreous detachment of both eyes  H43.813     6. Essential hypertension  I10     7. Hypertensive retinopathy of both eyes  H35.033     8. Pseudophakia, both eyes  Z96.1      Vitreous Hemorrhage OS -- improved - pt hospitalized on 06.18.22 for diverticulitis that became sepsis - vision loss first noted ~06.22.22 after coming off vent in ICU - b-scan 6.29.22 shows vitreous opacities consistent w/ VH, no obvious RT/RD or mass OS - s/p IVA OS #1 (06.29.22), #2 (07.27.22), #3 (09.06.22) - today, VH vastly improved and nasal peripapillary heme essentially resolved OS - BCVA is 20/20 OS - FA (07.27.22) shows mild staining / window defect + blockage OU -- no CNV - OCT shows stable improvement in vitreous opacities - IVA informed consent obtained and signed, 06.29.22 (OS) - f/u 9-12 months, DFE, OCT  2-4. Myopic degeneration OU (OS>OD)  - OS with posterior staphyloma / CR atrophy inf macula  - no CNV OU -- confirmed on FA today, 7.27.22   - OCT OS shows severe myopic contour with posterior staphyloma and CR  atrophy, mild ERM, stable cystic changes nasal to staphyloma, no SRF  - no RT/RD on peripheral exam  - monitor  - f/u in 1 year DFE, OCT  5. PVD / vitreous syneresis OU  - asymptomatic  - Discussed findings and prognosis  - No RT or RD on 360 peripheral exam  - Reviewed s/s of RT/RD  - Strict return precautions for any such RT/RD symptoms  6,7. Hypertensive retinopathy  OU - discussed importance of tight BP control - monitor  8. Pseudophakia OU  - s/p CE/IOL OU (Dr. Lucita Ferrara)  - IOLs in good position, doing well - monitor  Ophthalmic Meds Ordered this visit:  No orders of the defined types were placed in this encounter.    Return for f/u 9-12 months, VH OS, DFE, OCT.  There are no Patient Instructions on file for this visit.  This document serves as a record of services personally performed by Gardiner Sleeper, MD, PhD. It was created on their behalf by Estill Bakes, COT an ophthalmic technician. The creation of this record is the provider's dictation and/or activities during the visit.    Electronically signed by: Estill Bakes, [place] 10.27.22 @ 12:57 PM   This document serves as a record of services personally performed by Gardiner Sleeper, MD, PhD. It was created on their behalf by San Jetty. Owens Shark, OA an ophthalmic technician. The creation of this record is the provider's dictation and/or activities during the visit.    Electronically signed by: San Jetty. Marguerita Merles 11.01.2022 12:57 PM  Gardiner Sleeper, M.D., Ph.D. Diseases & Surgery of the Retina and Vitreous [facility]  I have reviewed the above documentation for accuracy and completeness, and I agree with the above. Gardiner Sleeper, M.D., Ph.D. 04/24/21 12:57 PM  Abbreviations: M myopia (nearsighted); A astigmatism; H hyperopia (farsighted); P presbyopia; Mrx spectacle prescription;  CTL contact lenses; OD right eye; OS left eye; OU both eyes  XT exotropia; ET esotropia; PEK punctate epithelial keratitis; PEE punctate epithelial erosions; DES dry eye syndrome; MGD meibomian gland dysfunction; ATs artificial tears; PFAT's preservative free artificial tears; [place] nuclear sclerotic cataract; PSC posterior subcapsular cataract; ERM epi-retinal membrane; PVD posterior vitreous detachment; RD retinal detachment; DM diabetes mellitus; DR diabetic retinopathy; NPDR  non-proliferative diabetic retinopathy; PDR proliferative diabetic retinopathy; CSME clinically significant macular edema; DME diabetic macular edema; dbh dot blot hemorrhages; CWS cotton wool spot; POAG primary open angle glaucoma; C/D cup-to-disc ratio; HVF humphrey visual field; GVF goldmann visual field; OCT optical coherence tomography; IOP intraocular pressure; BRVO Branch retinal vein occlusion; CRVO central retinal vein occlusion; CRAO central retinal artery occlusion; BRAO branch retinal artery occlusion; RT retinal tear; SB scleral buckle; PPV pars plana vitrectomy; VH Vitreous hemorrhage; PRP panretinal laser photocoagulation; IVK intravitreal kenalog; VMT vitreomacular traction; MH Macular hole;  NVD neovascularization of the disc; NVE neovascularization elsewhere; AREDS age related eye disease study; ARMD age related macular degeneration; POAG primary open angle glaucoma; EBMD epithelial/anterior basement membrane dystrophy; ACIOL anterior chamber intraocular lens; IOL intraocular lens; PCIOL posterior chamber intraocular lens; Phaco/IOL phacoemulsification with intraocular lens placement; Rigby photorefractive keratectomy; LASIK laser assisted in situ keratomileusis; HTN hypertension; DM diabetes mellitus; COPD chronic obstructive pulmonary disease

## 2021-04-24 ENCOUNTER — Ambulatory Visit (INDEPENDENT_AMBULATORY_CARE_PROVIDER_SITE_OTHER): Payer: PPO | Admitting: Ophthalmology

## 2021-04-24 ENCOUNTER — Other Ambulatory Visit: Payer: Self-pay

## 2021-04-24 ENCOUNTER — Encounter (INDEPENDENT_AMBULATORY_CARE_PROVIDER_SITE_OTHER): Payer: Self-pay | Admitting: Ophthalmology

## 2021-04-24 DIAGNOSIS — H3581 Retinal edema: Secondary | ICD-10-CM

## 2021-04-24 DIAGNOSIS — H43813 Vitreous degeneration, bilateral: Secondary | ICD-10-CM

## 2021-04-24 DIAGNOSIS — H442E3 Degenerative myopia with other maculopathy, bilateral eye: Secondary | ICD-10-CM

## 2021-04-24 DIAGNOSIS — I10 Essential (primary) hypertension: Secondary | ICD-10-CM | POA: Diagnosis not present

## 2021-04-24 DIAGNOSIS — H4312 Vitreous hemorrhage, left eye: Secondary | ICD-10-CM | POA: Diagnosis not present

## 2021-04-24 DIAGNOSIS — H5213 Myopia, bilateral: Secondary | ICD-10-CM

## 2021-04-24 DIAGNOSIS — H35033 Hypertensive retinopathy, bilateral: Secondary | ICD-10-CM

## 2021-04-24 DIAGNOSIS — Z961 Presence of intraocular lens: Secondary | ICD-10-CM | POA: Diagnosis not present

## 2021-05-13 DIAGNOSIS — R531 Weakness: Secondary | ICD-10-CM | POA: Diagnosis not present

## 2021-05-13 DIAGNOSIS — K5732 Diverticulitis of large intestine without perforation or abscess without bleeding: Secondary | ICD-10-CM | POA: Diagnosis not present

## 2021-05-13 DIAGNOSIS — A419 Sepsis, unspecified organism: Secondary | ICD-10-CM | POA: Diagnosis not present

## 2021-05-13 DIAGNOSIS — Q0700 Arnold-Chiari syndrome without spina bifida or hydrocephalus: Secondary | ICD-10-CM | POA: Diagnosis not present

## 2021-05-30 ENCOUNTER — Ambulatory Visit (HOSPITAL_COMMUNITY)
Admission: RE | Admit: 2021-05-30 | Discharge: 2021-05-30 | Disposition: A | Discharge status: Home / Self Care | Payer: PPO | Source: Ambulatory Visit | Attending: Internal Medicine | Admitting: Internal Medicine

## 2021-05-30 ENCOUNTER — Other Ambulatory Visit: Payer: Self-pay

## 2021-05-30 ENCOUNTER — Encounter (HOSPITAL_COMMUNITY): Payer: Self-pay

## 2021-05-30 DIAGNOSIS — K5909 Other constipation: Secondary | ICD-10-CM | POA: Insufficient documentation

## 2021-05-30 DIAGNOSIS — K7460 Unspecified cirrhosis of liver: Secondary | ICD-10-CM | POA: Insufficient documentation

## 2021-05-30 DIAGNOSIS — Z8719 Personal history of other diseases of the digestive system: Secondary | ICD-10-CM | POA: Diagnosis not present

## 2021-05-30 DIAGNOSIS — A419 Sepsis, unspecified organism: Secondary | ICD-10-CM | POA: Diagnosis not present

## 2021-05-30 DIAGNOSIS — N281 Cyst of kidney, acquired: Secondary | ICD-10-CM | POA: Diagnosis not present

## 2021-05-30 DIAGNOSIS — I700 Atherosclerosis of aorta: Secondary | ICD-10-CM | POA: Diagnosis not present

## 2021-05-30 DIAGNOSIS — K5730 Diverticulosis of large intestine without perforation or abscess without bleeding: Secondary | ICD-10-CM | POA: Diagnosis not present

## 2021-05-30 LAB — POCT I-STAT CREATININE: Creatinine, Ser: 0.6 mg/dL (ref 0.44–1.00)

## 2021-05-30 MED ORDER — IOHEXOL 350 MG/ML SOLN
75.0000 mL | Freq: Once | INTRAVENOUS | Status: AC | PRN
  Administered 2021-05-30: 75 mL via INTRAVENOUS

## 2021-05-30 MED ORDER — SODIUM CHLORIDE (PF) 0.9 % IJ SOLN
INTRAMUSCULAR | Status: AC
  Filled 2021-05-30: qty 50

## 2021-06-12 DIAGNOSIS — Q0700 Arnold-Chiari syndrome without spina bifida or hydrocephalus: Secondary | ICD-10-CM | POA: Diagnosis not present

## 2021-06-12 DIAGNOSIS — A419 Sepsis, unspecified organism: Secondary | ICD-10-CM | POA: Diagnosis not present

## 2021-06-12 DIAGNOSIS — K5732 Diverticulitis of large intestine without perforation or abscess without bleeding: Secondary | ICD-10-CM | POA: Diagnosis not present

## 2021-06-12 DIAGNOSIS — R531 Weakness: Secondary | ICD-10-CM | POA: Diagnosis not present

## 2021-09-10 ENCOUNTER — Other Ambulatory Visit: Payer: Self-pay | Admitting: Internal Medicine

## 2021-09-19 ENCOUNTER — Encounter (HOSPITAL_COMMUNITY): Payer: Self-pay

## 2021-10-16 ENCOUNTER — Encounter: Payer: Self-pay | Admitting: Internal Medicine

## 2021-10-16 ENCOUNTER — Ambulatory Visit (INDEPENDENT_AMBULATORY_CARE_PROVIDER_SITE_OTHER): Payer: PPO | Admitting: Internal Medicine

## 2021-10-16 VITALS — BP 128/80 | HR 90 | Resp 18 | Ht 66.5 in | Wt 203.8 lb

## 2021-10-16 DIAGNOSIS — M25511 Pain in right shoulder: Secondary | ICD-10-CM

## 2021-10-16 DIAGNOSIS — R531 Weakness: Secondary | ICD-10-CM

## 2021-10-16 DIAGNOSIS — M545 Low back pain, unspecified: Secondary | ICD-10-CM

## 2021-10-16 DIAGNOSIS — R5383 Other fatigue: Secondary | ICD-10-CM | POA: Diagnosis not present

## 2021-10-16 DIAGNOSIS — G8929 Other chronic pain: Secondary | ICD-10-CM

## 2021-10-16 DIAGNOSIS — F418 Other specified anxiety disorders: Secondary | ICD-10-CM | POA: Diagnosis not present

## 2021-10-16 LAB — CBC
HCT: 41.6 % (ref 36.0–46.0)
Hemoglobin: 13.8 g/dL (ref 12.0–15.0)
MCHC: 33.1 g/dL (ref 30.0–36.0)
MCV: 88.6 fl (ref 78.0–100.0)
Platelets: 278 10*3/uL (ref 150.0–400.0)
RBC: 4.7 Mil/uL (ref 3.87–5.11)
RDW: 15.5 % (ref 11.5–15.5)
WBC: 7.3 10*3/uL (ref 4.0–10.5)

## 2021-10-16 LAB — VITAMIN D 25 HYDROXY (VIT D DEFICIENCY, FRACTURES): VITD: 35.9 ng/mL (ref 30.00–100.00)

## 2021-10-16 LAB — COMPREHENSIVE METABOLIC PANEL
ALT: 14 U/L (ref 0–35)
AST: 24 U/L (ref 0–37)
Albumin: 4.7 g/dL (ref 3.5–5.2)
Alkaline Phosphatase: 98 U/L (ref 39–117)
BUN: 17 mg/dL (ref 6–23)
CO2: 28 mEq/L (ref 19–32)
Calcium: 10.1 mg/dL (ref 8.4–10.5)
Chloride: 101 mEq/L (ref 96–112)
Creatinine, Ser: 0.67 mg/dL (ref 0.40–1.20)
GFR: 86.72 mL/min (ref >60.00)
Glucose, Bld: 105 mg/dL — ABNORMAL HIGH (ref 70–99)
Potassium: 4.4 mEq/L (ref 3.5–5.1)
Sodium: 136 mEq/L (ref 135–145)
Total Bilirubin: 0.5 mg/dL (ref 0.2–1.2)
Total Protein: 8.9 g/dL — ABNORMAL HIGH (ref 6.0–8.3)

## 2021-10-16 LAB — VITAMIN B12: Vitamin B-12: 1031 pg/mL — ABNORMAL HIGH (ref 211–911)

## 2021-10-16 LAB — TSH: TSH: 3.05 u[IU]/mL (ref 0.35–5.50)

## 2021-10-16 MED ORDER — TIZANIDINE HCL 2 MG PO TABS: 2.0000 mg | ORAL_TABLET | Freq: Three times a day (TID) | ORAL | 2 refills | Status: DC | PRN

## 2021-10-16 MED ORDER — TEMAZEPAM 15 MG PO CAPS: 15.0000 mg | ORAL_CAPSULE | Freq: Two times a day (BID) | ORAL | 4 refills | Status: DC | PRN

## 2021-10-16 NOTE — Patient Instructions (Addendum)
We will change the flexeril to tizanidine to see if this helps without side effects. ? ?We will check the labs today. ? ?We have refilled the temazepam today. ? ? ?

## 2021-10-16 NOTE — Progress Notes (Signed)
? ?  Subjective:  ? ?Patient ID: Angela Ramirez, female    DOB: 1946/03/16, 76 y.o.   MRN: [id number] ? ?HPI ?The patient is a 76 YO female coming in for follow up with concerns. ? ?Review of Systems  ?Constitutional:  Positive for activity change and fatigue.  ?HENT: Negative.    ?Eyes: Negative.   ?Respiratory:  Negative for cough, chest tightness and shortness of breath.   ?Cardiovascular:  Negative for chest pain, palpitations and leg swelling.  ?Gastrointestinal:  Negative for abdominal distention, abdominal pain, constipation, diarrhea, nausea and vomiting.  ?Musculoskeletal:  Positive for arthralgias and myalgias.  ?Skin: Negative.   ?Neurological: Negative.   ?Psychiatric/Behavioral: Negative.    ? ?Objective:  ?Physical Exam ?Constitutional:   ?   Appearance: She is well-developed.  ?HENT:  ?   Head: Normocephalic and atraumatic.  ?Cardiovascular:  ?   Rate and Rhythm: Normal rate and regular rhythm.  ?Pulmonary:  ?   Effort: Pulmonary effort is normal. No respiratory distress.  ?   Breath sounds: Normal breath sounds. No wheezing or rales.  ?Abdominal:  ?   General: Bowel sounds are normal. There is no distension.  ?   Palpations: Abdomen is soft.  ?   Tenderness: There is no abdominal tenderness. There is no rebound.  ?Musculoskeletal:     ?   General: Tenderness present.  ?   Cervical back: Normal range of motion.  ?Skin: ?   General: Skin is warm and dry.  ?Neurological:  ?   Mental Status: She is alert and oriented to person, place, and time.  ?   Coordination: Coordination normal.  ? ? ?Vitals:  ? 10/16/21 0908  ?BP: 128/80  ?Pulse: 90  ?Resp: 18  ?SpO2: 99%  ?Weight: 203 lb 12.8 oz (92.4 kg)  ?Height: 5' 6.5" (1.689 m)  ? ? ?This visit occurred during the SARS-CoV-2 public health emergency.  Safety protocols were in place, including screening questions prior to the visit, additional usage of staff PPE, and extensive cleaning of exam room while observing appropriate contact time as indicated for  disinfecting solutions.  ? ?Assessment & Plan:  ?Visit time 20 minutes in face to face communication with patient and coordination of care, additional 10 minutes spent in record review, coordination or care, ordering tests, communicating/referring to other healthcare professionals, documenting in medical records all on the same day of the visit for total time 30 minutes spent on the visit.  ? ?

## 2021-10-19 NOTE — Assessment & Plan Note (Signed)
Still struggling with this some although it is improving gradually. Uses temazepam for sleep or anxiety as needed up to BID prn and not using that often. Refilled today.  ?

## 2021-10-19 NOTE — Assessment & Plan Note (Addendum)
Improving dramatically although she is still struggling with low energy and being drained after doing things she used to be able to do without problems. We are adjusting her muscle relaxer to see if this can be less sedating from flexeril 5 mg TID prn to tizanidine 2 mg TID prn. Referral to PT to improve endurance and checking labs including CBC, CMP, TSH, vitamin B12 and D to rule out metabolic causes.  ?

## 2021-10-19 NOTE — Assessment & Plan Note (Signed)
We will stop flexeril 5 mg TID prn and switch to tizanidine 2 mg TID prn for pain to see if this is less sedating.  ?

## 2021-10-19 NOTE — Assessment & Plan Note (Signed)
Stop flexeril 5 mg TID prn and switch to tizanidine 2 mg TID prn.  ?

## 2021-10-24 ENCOUNTER — Ambulatory Visit: Payer: PPO | Admitting: Podiatry

## 2021-10-24 ENCOUNTER — Ambulatory Visit: Payer: PPO

## 2021-10-24 DIAGNOSIS — M7752 Other enthesopathy of left foot: Secondary | ICD-10-CM | POA: Diagnosis not present

## 2021-10-24 DIAGNOSIS — Z9889 Other specified postprocedural states: Secondary | ICD-10-CM

## 2021-10-24 DIAGNOSIS — M76822 Posterior tibial tendinitis, left leg: Secondary | ICD-10-CM

## 2021-10-24 DIAGNOSIS — M2012 Hallux valgus (acquired), left foot: Secondary | ICD-10-CM

## 2021-10-24 DIAGNOSIS — M898X7 Other specified disorders of bone, ankle and foot: Secondary | ICD-10-CM

## 2021-10-24 MED ORDER — BETAMETHASONE SOD PHOS & ACET 6 (3-3) MG/ML IJ SUSP
3.0000 mg | Freq: Once | INTRAMUSCULAR | Status: AC
  Administered 2021-10-24: 3 mg via INTRA_ARTICULAR

## 2021-10-24 MED ORDER — MELOXICAM 15 MG PO TABS: 15.0000 mg | ORAL_TABLET | Freq: Every day | ORAL | 1 refills | Status: DC

## 2021-10-24 NOTE — Progress Notes (Signed)
? ?Subjective:  ?76 y.o. female presenting today for evaluation of left foot and ankle pain.  Patient states that she is wanting to increase exercising and her left ankle has been hurting.  She was last seen in the office 01/22/2021 for posterior tibial tendinitis.  Currently the patient has not done anything for treatment to the left foot and ankle ? ? ?Past Medical History:  ?Diagnosis Date  ? Anxiety   ? Aortic atherosclerosis (HCC)   ? Arnold-Chiari malformation (HCC)   ? Arthritis   ? Cirrhosis (HCC)   ? Colon polyps   ? Depression   ? Diverticulitis   ? Diverticulosis   ? Emphysema of lung (HCC) 12/02/2020  ? per CT scan  ? Headache   ? Hepatitis C   ? Hiatal hernia   ? Hypertension   ? Hypertensive retinopathy   ? IBS (irritable bowel syndrome)   ? Internal hemorrhoids   ? Sepsis (HCC) 11/2020  ? UTI (urinary tract infection)   ? ?Past Surgical History:  ?Procedure Laterality Date  ? BRAIN SURGERY  2003  ? decompression  ? BREAST EXCISIONAL BIOPSY Bilateral   ? age 66's  ? CATARACT EXTRACTION    ? DILATION AND CURETTAGE OF UTERUS    ? EYE SURGERY    ? OVARIAN CYST REMOVAL Bilateral   ? REDUCTION MAMMAPLASTY Bilateral   ? 25+ years ago  ? ?Allergies  ?Allergen Reactions  ? Crab (Diagnostic) Hives  ? Morphine And Related Nausea And Vomiting  ? Sulfa Antibiotics Nausea And Vomiting  ? ? ? ?Objective / Physical Exam:  ?General:  ?The patient is alert and oriented x3 in no acute distress. ?Dermatology:  ?Skin is warm, dry and supple bilateral lower extremities. Negative for open lesions or macerations. ?Vascular:  ?Palpable pedal pulses bilaterally. No edema or erythema noted. Capillary refill within normal limits. ?Neurological:  ?Epicritic and protective threshold grossly intact bilaterally.  ?Musculoskeletal Exam:  ?Pain on palpation to the lateral aspect of the patient's left ankle. Mild edema noted. Range of motion within normal limits to all pedal and ankle joints bilateral. Muscle strength 5/5 in all groups  bilateral.  There is also pain along the posterior tibial tendon left and collapse of the medial longitudinal arch with weightbearing consistent with a PTTD ? ?Radiographic Exam:  ?Normal osseous mineralization. Joint spaces preserved. No fracture/dislocation/boney destruction.   ? ?Assessment: ?1.  Capsulitis left ankle; lateral aspect ?2.  Posterior tibial tendon dysfunction/tendinitis left ? ?Plan of Care:  ?1. Patient was evaluated. X-Rays reviewed.  ?2. Injection of 0.5 mL Celestone Soluspan injected in the patient's left ankle lateral aspect. ?3.  In regards to the posterior tibial tendinitis I do believe the patient would benefit from custom molded orthotics to help support the medial longitudinal arch of the foot and alleviate pressure from posterior tibial tendon strain ?4.  Appointment with Pedorthist for custom molded orthotics ?5.  Prescription for meloxicam 15 mg daily as needed ?6.  Return to clinic as needed ? ? ?Felecia Shelling, DPM ?[facility] ? ?Dr. Felecia Shelling, DPM  ?  ?[address]                                      ?[place], [place] [address]                ?Office [phone]  ?Fax [phone]  [phone] ? ? ? ? ? ? ?

## 2021-10-24 NOTE — Progress Notes (Signed)
SITUATION ?Reason for Consult: Evaluation for Bilateral Custom Foot Orthoses ?Patient / Caregiver Report: Patient is ready for foot orthotics ? ?OBJECTIVE DATA: ?Patient History / Diagnosis:  ?  ICD-10-CM   ?1. Hallux valgus, left  M20.12   ?  ?2. Posterior tibial tendinitis, left  M76.822   ?  ?3. Status post left foot surgery  Z98.890   ?  ?4. Exostosis of left foot  M89.8X7   ?  ? ? ?Current or Previous Devices:   None and no history ? ?Foot Examination: ?Skin presentation:   Intact ?Ulcers & Callousing:   None ?Toe / Foot Deformities:  Hallux valgus ?Weight Bearing Presentation:  Planus ?Sensation:    Intact ? ?Shoe Size:    69M ? ?ORTHOTIC RECOMMENDATION ?Recommended Device: 1x pair of custom functional foot orthotics ? ?GOALS OF ORTHOSES ?- Reduce Pain ?- Prevent Foot Deformity ?- Prevent Progression of Further Foot Deformity ?- Relieve Pressure ?- Improve the Overall Biomechanical Function of the Foot and Lower Extremity. ? ?ACTIONS PERFORMED ?Potential out of pocket cost was communicated to patient. Patient understood and consent to casting. Patient was casted for Foot Orthoses via crush box. Procedure was explained and patient tolerated procedure well. Casts were shipped to central fabrication. All questions were answered and concerns addressed. ? ?PLAN ?Patient is to be called for fitting when devices are ready.  ? ? ?

## 2021-10-30 ENCOUNTER — Ambulatory Visit: Payer: PPO | Admitting: Internal Medicine

## 2021-10-30 ENCOUNTER — Encounter: Payer: Self-pay | Admitting: Internal Medicine

## 2021-10-30 VITALS — BP 140/84 | HR 89 | Ht 66.0 in | Wt 205.8 lb

## 2021-10-30 DIAGNOSIS — R140 Abdominal distension (gaseous): Secondary | ICD-10-CM

## 2021-10-30 DIAGNOSIS — K7460 Unspecified cirrhosis of liver: Secondary | ICD-10-CM | POA: Diagnosis not present

## 2021-10-30 DIAGNOSIS — K5909 Other constipation: Secondary | ICD-10-CM

## 2021-10-30 NOTE — Patient Instructions (Addendum)
Increase your Linzess to once daily. Samples given today. Let us know how your doing with Linzess. If works better , then we will send prescription for pharmacy for you.  ? ?You have been scheduled for an endoscopy. Please follow written instructions given to you at your visit today. ?If you use inhalers (even only as needed), please bring them with you on the day of your procedure. ? ?If you are age 74 or older, your body mass index should be between 23-30. Your Body mass index is 33.22 kg/m?Marland Kitchen If this is out of the aforementioned range listed, please consider follow up with your Primary Care Provider. ? ?If you are age 45 or younger, your body mass index should be between 19-25. Your Body mass index is 33.22 kg/m?Marland Kitchen If this is out of the aformentioned range listed, please consider follow up with your Primary Care Provider.  ? ?________________________________________________________ ? ?The [facility] providers would like to encourage you to use [facility] to communicate with providers for non-urgent requests or questions.  Due to long hold times on the telephone, sending your provider a message by [facility] may be a faster and more efficient way to get a response.  Please allow 48 business hours for a response.  Please remember that this is for non-urgent requests.  ?_______________________________________________________ ? ?Thank you for choosing me and [facility]. ? ?Dr. Vonna Kotyk Pyrtle  ? ?

## 2021-10-30 NOTE — Progress Notes (Signed)
? ?Subjective:  ? ? Patient ID: Angela Ramirez, female    DOB: February 16, 1946, 76 y.o.   MRN: [id number] ? ?HPI ?Angela Ramirez is a 76 year old female with a history of treated hepatitis C cirrhosis without significant portal hypertension, history of complicated diverticulitis, chronic constipation who is here for follow-up.  I last saw her on 04/02/2021. ? ?She reports that she continues to feel well though she occasionally has intermittent minor left lower quadrant soreness and a "pinching" type sensation.  She is using the Linzess 145 mcg daily.  Stools are formed and occasionally incomplete.  She does have abdominal bloating and fullness.  She is eating a healthy diet and has reintroduced foods like cabbage and broccoli.  She does not have blood in stool or melena.  No fever or chills.  No use of MiraLAX of late.  No upper GI or hepatobiliary complaint.  No lower extremity edema.  No dark urine.  No itching.  No easy bruising or bleeding.  No dysphagia or odynophagia. ? ? ?Review of Systems ?As per HPI otherwise negative ? ?Current Medications, Allergies, Past Medical History, Past Surgical History, Family History and Social History were reviewed in [employer] record. ?   ?Objective:  ? Physical Exam ?BP 140/84   Pulse 89   Ht 5\' 6"  (1.676 m)   Wt 205 lb 12.8 oz (93.4 kg)   SpO2 96%   BMI 33.22 kg/m?  ?Gen: awake, alert, NAD ?HEENT: anicteric ?CV: RRR, no mrg ?Pulm: CTA b/l ?Abd: soft, nontender, mildly distended with tympany, no ascites,  +BS throughout ?Ext: no c/c/e ?Neuro: nonfocal ? ? ?  Latest Ref Rng & Units 10/16/2021  ?  9:47 AM 01/02/2021  ?  3:52 PM 12/11/2020  ? 12:19 AM  ?CBC  ?WBC 4.0 - 10.5 K/uL 7.3   8.4   7.0    ?Hemoglobin 12.0 - 15.0 g/dL 12/13/2020   96.7   9.9    ?Hematocrit 36.0 - 46.0 % 41.6   37.6   31.6    ?Platelets 150.0 - 400.0 K/uL 278.0   379.0   436    ? ?Lab Results  ?Component Value Date  ? INR 1.0 01/02/2021  ? INR 1.6 (H) 12/04/2020  ? INR 1.5 (H) 12/03/2020   ? ?CMP  ?   ?Component Value Date/Time  ? NA 136 10/16/2021 0947  ? K 4.4 10/16/2021 0947  ? CL 101 10/16/2021 0947  ? CO2 28 10/16/2021 0947  ? GLUCOSE 105 (H) 10/16/2021 0947  ? BUN 17 10/16/2021 0947  ? CREATININE 0.67 10/16/2021 0947  ? CREATININE 0.80 04/09/2018 1011  ? CALCIUM 10.1 10/16/2021 0947  ? PROT 8.9 (H) 10/16/2021 0947  ? ALBUMIN 4.7 10/16/2021 0947  ? AST 24 10/16/2021 0947  ? ALT 14 10/16/2021 0947  ? ALT 25 09/17/2017 1435  ? ALKPHOS 98 10/16/2021 0947  ? BILITOT 0.5 10/16/2021 0947  ? GFRNONAA >60 12/11/2020 0019  ? GFRNONAA 75 04/09/2018 1011  ? GFRAA 87 04/09/2018 1011  ? ?CT ABDOMEN AND PELVIS WITH CONTRAST ?  ?TECHNIQUE: ?Multidetector CT imaging of the abdomen and pelvis was performed ?using the standard protocol following bolus administration of ?intravenous contrast. ?  ?CONTRAST:  42mL OMNIPAQUE IOHEXOL 350 MG/ML SOLN ?  ?COMPARISON:  12/03/2020 ?  ?FINDINGS: ?Lower Chest: No acute findings. ?  ?Hepatobiliary: No hepatic masses identified. Gallbladder is ?unremarkable. No evidence of biliary ductal dilatation. ?  ?Pancreas:  No mass or inflammatory changes. ?  ?Spleen:  Within normal limits in size and appearance. ?  ?Adrenals/Urinary Tract: No masses identified. A few tiny sub-cm ?right renal cysts remains stable. No evidence of ureteral calculi or ?hydronephrosis. Unremarkable unopacified urinary bladder. ?  ?Stomach/Bowel: No evidence of obstruction, inflammatory process or ?abnormal fluid collections. Normal appendix visualized. ?Diverticulosis is seen mainly involving the sigmoid colon, however ?there is no evidence of diverticulitis. ?  ?Vascular/Lymphatic: No pathologically enlarged lymph nodes. No acute ?vascular findings. Aortic atherosclerotic calcification noted. ?Retroaortic left renal vein incidentally noted. ?  ?Reproductive:  No mass or other significant abnormality. ?  ?Other:  None. ?  ?Musculoskeletal:  No suspicious bone lesions identified. ?  ?IMPRESSION: ?Colonic  diverticulosis. No radiographic evidence of diverticulitis ?or other acute findings. ?  ?Aortic Atherosclerosis (ICD10-I70.0). ?  ?  ?Electronically Signed ?  By: Danae Orleans M.D. ?  On: 05/31/2021 12:54 ?   ? ? ? ?   ?Assessment & Plan:  ?76 year old female with a history of treated hepatitis C cirrhosis without significant portal hypertension, history of complicated diverticulitis, chronic constipation who is here for follow-up.  ? ?Compensated hepatitis C (eradicated) cirrhosis -- CPT A cirrhosis without evidence of significant portal hypertension.  Lab parameters are remarkably normal. ?--EGD recommended for variceal screening; last EGD just 2 years ago.  We discussed the risk, benefits and alternatives and she is agreeable and wishes to proceed ?--HCC screening up-to-date with CT scan as above, recommend abdominal ultrasound in December 2023.  We can do ultrasound every 6 months alternating with CT scan with contrast every other year. ?--Alcohol avoidance ?--No evidence of encephalopathy, ascites or volume overload ? ?2.  IBS with constipation --still some abdominal bloating and incomplete evacuation. ?--Trial of Linzess 290 mcg daily; samples provided.  She will let me know if this is effective.  If not we could try Amitiza or Trulance.  She will remain off MiraLAX ? ?3.  History of complicated diverticulitis --she is doing well.  No evidence of ongoing inflammation or complication by recent CT scan. ? ?54-month follow-up, sooner if needed ? ?30 minutes total spent today including patient facing time, coordination of care, reviewing medical history/procedures/pertinent radiology studies, and documentation of the encounter. ? ? ?

## 2021-11-05 ENCOUNTER — Telehealth: Payer: Self-pay

## 2021-11-05 NOTE — Telephone Encounter (Signed)
[employer] calling request last OV before covering HOS Bed. ? ?I faxed over 10/16/21 to [phone]. ?Confirmation was received  ? ?FYI ?

## 2021-11-05 NOTE — Telephone Encounter (Signed)
Noted  

## 2021-11-08 ENCOUNTER — Ambulatory Visit (AMBULATORY_SURGERY_CENTER): Payer: PPO | Admitting: Internal Medicine

## 2021-11-08 ENCOUNTER — Encounter: Payer: Self-pay | Admitting: Internal Medicine

## 2021-11-08 VITALS — BP 139/72 | HR 78 | Temp 97.3°F | Resp 13 | Ht 66.0 in | Wt 205.0 lb

## 2021-11-08 DIAGNOSIS — K2970 Gastritis, unspecified, without bleeding: Secondary | ICD-10-CM

## 2021-11-08 DIAGNOSIS — K219 Gastro-esophageal reflux disease without esophagitis: Secondary | ICD-10-CM | POA: Diagnosis not present

## 2021-11-08 DIAGNOSIS — K5909 Other constipation: Secondary | ICD-10-CM | POA: Diagnosis not present

## 2021-11-08 DIAGNOSIS — K7460 Unspecified cirrhosis of liver: Secondary | ICD-10-CM | POA: Diagnosis not present

## 2021-11-08 DIAGNOSIS — K449 Diaphragmatic hernia without obstruction or gangrene: Secondary | ICD-10-CM

## 2021-11-08 DIAGNOSIS — R140 Abdominal distension (gaseous): Secondary | ICD-10-CM | POA: Diagnosis not present

## 2021-11-08 DIAGNOSIS — J439 Emphysema, unspecified: Secondary | ICD-10-CM | POA: Diagnosis not present

## 2021-11-08 DIAGNOSIS — K31819 Angiodysplasia of stomach and duodenum without bleeding: Secondary | ICD-10-CM | POA: Diagnosis not present

## 2021-11-08 DIAGNOSIS — K2990 Gastroduodenitis, unspecified, without bleeding: Secondary | ICD-10-CM | POA: Diagnosis not present

## 2021-11-08 DIAGNOSIS — I10 Essential (primary) hypertension: Secondary | ICD-10-CM | POA: Diagnosis not present

## 2021-11-08 MED ORDER — PANTOPRAZOLE SODIUM 40 MG PO TBEC: 40.0000 mg | DELAYED_RELEASE_TABLET | Freq: Every day | ORAL | 5 refills | Status: DC

## 2021-11-08 MED ORDER — SODIUM CHLORIDE 0.9 % IV SOLN: 500.0000 mL | Freq: Once | INTRAVENOUS | Status: DC

## 2021-11-08 NOTE — Op Note (Signed)
[facility] Patient Name: Angela Ramirez Procedure Date: 5/18/20Marinell Blight23 9:59 AM MRN: [id number] Endoscopist: Beverley FiedlerJay M Laurencia Roma , MD Age: [age] Referring MD:  Date of Birth: February 27, 1946 Gender: Female Account #: [id number] Procedure:                Upper GI endoscopy Indications:              Cirrhosis rule out esophageal varices, last exam 2                            yrs ago Medicines:                Monitored Anesthesia Care Procedure:                Pre-Anesthesia Assessment:                           - Prior to the procedure, a History and Physical                            was performed, and patient medications and                            allergies were reviewed. The patient's tolerance of                            previous anesthesia was also reviewed. The risks                            and benefits of the procedure and the sedation                            options and risks were discussed with the patient.                            All questions were answered, and informed consent                            was obtained. Prior Anticoagulants: The patient has                            taken no previous anticoagulant or antiplatelet                            agents. ASA Grade Assessment: III - A patient with                            severe systemic disease. After reviewing the risks                            and benefits, the patient was deemed in                            satisfactory condition to undergo the procedure.  After obtaining informed consent, the endoscope was                            passed under direct vision. Throughout the                            procedure, the patient's blood pressure, pulse, and                            oxygen saturations were monitored continuously. The                            GIF [id number] #[id number] was introduced through the                            mouth, and advanced to the second part of  duodenum.                            The upper GI endoscopy was accomplished without                            difficulty. The patient tolerated the procedure                            well. Scope In: Scope Out: Findings:                 LA Grade A (one or more mucosal breaks less than 5                            mm, not extending between tops of 2 mucosal folds)                            esophagitis with no bleeding was found at the                            gastroesophageal junction.                           A 2 cm hiatal hernia was present.                           There is no endoscopic evidence of varices in the                            lower third of the esophagus.                           Severe inflammation characterized by congestion                            (edema), erosions, erythema, granularity and                            shallow ulcerations was found in  the gastric                            antrum. Biopsies were taken with a cold forceps for                            histology and Helicobacter pylori testing.                           The cardia, gastric fundus and gastric body were                            normal. Biopsies were taken with a cold forceps for                            histology.                           Mild inflammation characterized by erosions and                            erythema was found in the duodenal bulb.                           The second portion of the duodenum was normal. Complications:            No immediate complications. Estimated Blood Loss:     Estimated blood loss was minimal. Impression:               - Mild acid reflux esophagitis with no bleeding.                           - 2 cm hiatal hernia.                           - Normal cardia, gastric fundus and gastric body.                            Biopsied.                           - Gastritis with ulcers. Biopsied.                           - Mild duodenitis  in bulb.                           - Normal second portion of the duodenum. Recommendation:           - Patient has a contact number available for                            emergencies. The signs and symptoms of potential                            delayed complications were discussed with the  patient. Return to normal activities tomorrow.                            Written discharge instructions were provided to the                            patient.                           - Resume previous diet.                           - Continue present medications.                           - Begin pantoprazole 40 mg once daily for                            gastroduodenitis and ulcers. Follow-up H. Pylori                            status with gastric biopsies.                           - Await pathology results.                           - Repeat upper endoscopy in 2 years for screening                            purposes. Beverley Fiedler, MD 11/08/2021 10:20:09 AM This report has been signed electronically.

## 2021-11-08 NOTE — Patient Instructions (Signed)
Handouts given for gastritis and Hiatal Hernia.  YOU HAD AN ENDOSCOPIC PROCEDURE TODAY AT [facility]:   Refer to the procedure report that was given to you for any specific questions about what was found during the examination.  If the procedure report does not answer your questions, please call your gastroenterologist to clarify.  If you requested that your care partner not be given the details of your procedure findings, then the procedure report has been included in a sealed envelope for you to review at your convenience later.  YOU SHOULD EXPECT: Some feelings of bloating in the abdomen. Passage of more gas than usual.  Walking can help get rid of the air that was put into your GI tract during the procedure and reduce the bloating. If you had a lower endoscopy (such as a colonoscopy or flexible sigmoidoscopy) you may notice spotting of blood in your stool or on the toilet paper. If you underwent a bowel prep for your procedure, you may not have a normal bowel movement for a few days.  Please Note:  You might notice some irritation and congestion in your nose or some drainage.  This is from the oxygen used during your procedure.  There is no need for concern and it should clear up in a day or so.  SYMPTOMS TO REPORT IMMEDIATELY:  Following upper endoscopy (EGD)  Vomiting of blood or coffee ground material  New chest pain or pain under the shoulder blades  Painful or persistently difficult swallowing  New shortness of breath  Fever of 100F or higher  Black, tarry-looking stools  For urgent or emergent issues, a gastroenterologist can be reached at any hour by calling [phone]. Do not use MyChart messaging for urgent concerns.    DIET:  We do recommend a small meal at first, but then you may proceed to your regular diet.  Drink plenty of fluids but you should avoid alcoholic beverages for 24 hours.  ACTIVITY:  You should plan to take it easy for the rest of today and  you should NOT DRIVE or use heavy machinery until tomorrow (because of the sedation medicines used during the test).    FOLLOW UP: Our staff will call the number listed on your records 48-72 hours following your procedure to check on you and address any questions or concerns that you may have regarding the information given to you following your procedure. If we do not reach you, we will leave a message.  We will attempt to reach you two times.  During this call, we will ask if you have developed any symptoms of COVID 19. If you develop any symptoms (ie: fever, flu-like symptoms, shortness of breath, cough etc.) before then, please call [phone].  If you test positive for Covid 19 in the 2 weeks post procedure, please call and report this information to [place].    If any biopsies were taken you will be contacted by phone or by letter within the next 1-3 weeks.  Please call us at [phone] if you have not heard about the biopsies in 3 weeks.    SIGNATURES/CONFIDENTIALITY: You and/or your care partner have signed paperwork which will be entered into your electronic medical record.  These signatures attest to the fact that that the information above on your After Visit Summary has been reviewed and is understood.  Full responsibility of the confidentiality of this discharge information lies with you and/or your care-partner.

## 2021-11-08 NOTE — Progress Notes (Signed)
Pt's states no medical or surgical changes since previsit or office visit. 

## 2021-11-08 NOTE — Progress Notes (Signed)
Patient presenting for upper endoscopy for variceal screening See office note and H&P on 10/24/2021 for details She remains appropriate for [facility] upper endoscopy this morning

## 2021-11-08 NOTE — Progress Notes (Signed)
Report to PACU, RN, vss, BBS= Clear.  

## 2021-11-09 ENCOUNTER — Telehealth: Payer: Self-pay

## 2021-11-09 NOTE — Telephone Encounter (Signed)
  Follow up Call-     11/08/2021    9:24 AM 11/01/2019    1:29 PM  Call back number  Post procedure Call Back phone  # [phone]  Permission to leave phone message Yes Yes     Patient questions:  Do you have a fever, pain , or abdominal swelling? No. Pain Score  0 *  Have you tolerated food without any problems? Yes.    Have you been able to return to your normal activities? Yes.    Do you have any questions about your discharge instructions: Diet   No. Medications  No. Follow up visit  No.  Do you have questions or concerns about your Care? No.  Actions: * If pain score is 4 or above: No action needed, pain <4.

## 2021-11-13 ENCOUNTER — Encounter: Payer: Self-pay | Admitting: Internal Medicine

## 2021-11-13 ENCOUNTER — Ambulatory Visit (INDEPENDENT_AMBULATORY_CARE_PROVIDER_SITE_OTHER): Payer: PPO

## 2021-11-13 DIAGNOSIS — M2012 Hallux valgus (acquired), left foot: Secondary | ICD-10-CM

## 2021-11-13 DIAGNOSIS — M2011 Hallux valgus (acquired), right foot: Secondary | ICD-10-CM

## 2021-11-13 NOTE — Progress Notes (Signed)
SITUATION: Reason for Visit: Fitting and Delivery of Custom Fabricated Foot Orthoses Patient Report: Patient reports comfort and is satisfied with device.  OBJECTIVE DATA: Patient History / Diagnosis:     ICD-10-CM   1. Hallux valgus, left  M20.12       Provided Device:  Custom Functional Foot Orthotics     [employer]: [id number]  GOAL OF ORTHOSIS - Improve gait - Decrease energy expenditure - Improve Balance - Provide Triplanar stability of foot complex - Facilitate motion  ACTIONS PERFORMED Patient was fit with foot orthotics trimmed to shoe last. Patient tolerated fittign procedure.   Patient was provided with verbal and written instruction and demonstration regarding donning, doffing, wear, care, proper fit, function, purpose, cleaning, and use of the orthosis and in all related precautions and risks and benefits regarding the orthosis.  Patient was also provided with verbal instruction regarding how to report any failures or malfunctions of the orthosis and necessary follow up care. Patient was also instructed to contact our office regarding any change in status that may affect the function of the orthosis.  Patient demonstrated independence with proper donning, doffing, and fit and verbalized understanding of all instructions.  PLAN: Patient is to follow up in one week or as necessary (PRN). All questions were answered and concerns addressed. Plan of care was discussed with and agreed upon by the patient.

## 2021-11-21 ENCOUNTER — Other Ambulatory Visit: Payer: Self-pay | Admitting: Internal Medicine

## 2021-11-21 DIAGNOSIS — E669 Obesity, unspecified: Secondary | ICD-10-CM

## 2021-11-29 ENCOUNTER — Other Ambulatory Visit: Payer: Self-pay | Admitting: Internal Medicine

## 2021-12-28 ENCOUNTER — Telehealth: Payer: Self-pay

## 2021-12-28 DIAGNOSIS — R062 Wheezing: Secondary | ICD-10-CM

## 2021-12-28 MED ORDER — LEVALBUTEROL TARTRATE 45 MCG/ACT IN AERO: INHALATION_SPRAY | RESPIRATORY_TRACT | 0 refills | Status: DC

## 2021-12-28 NOTE — Telephone Encounter (Signed)
Rx refilled.

## 2021-12-28 NOTE — Telephone Encounter (Signed)
Pt is requesting a refill on: levalbuterol [facility] HFA) 45 MCG/ACT inhaler   Pharmacy: [id number] [place], [place] - [address] AT [address]  LOV: 10/16/21 ROV 12/31/21

## 2021-12-31 ENCOUNTER — Ambulatory Visit (INDEPENDENT_AMBULATORY_CARE_PROVIDER_SITE_OTHER): Payer: PPO | Admitting: Internal Medicine

## 2021-12-31 ENCOUNTER — Ambulatory Visit (INDEPENDENT_AMBULATORY_CARE_PROVIDER_SITE_OTHER): Payer: PPO

## 2021-12-31 ENCOUNTER — Encounter: Payer: Self-pay | Admitting: Internal Medicine

## 2021-12-31 VITALS — BP 124/80 | HR 87 | Resp 18 | Ht 66.0 in | Wt 200.8 lb

## 2021-12-31 DIAGNOSIS — Z23 Encounter for immunization: Secondary | ICD-10-CM | POA: Diagnosis not present

## 2021-12-31 DIAGNOSIS — R0602 Shortness of breath: Secondary | ICD-10-CM | POA: Diagnosis not present

## 2021-12-31 DIAGNOSIS — R059 Cough, unspecified: Secondary | ICD-10-CM | POA: Diagnosis not present

## 2021-12-31 NOTE — Patient Instructions (Addendum)
We will check the chest x-ray today for the breathing.  We will order the stress test.

## 2021-12-31 NOTE — Progress Notes (Signed)
   Subjective:   Patient ID: Angela Ramirez, female    DOB: 24-Apr-1946, 76 y.o.   MRN: [id number]  HPI The patient is a 76 YO female coming in for SOB on exertion and cough. Cough lasting several weeks. SOB on exertion since illness last year.   Review of Systems  Constitutional:  Positive for fatigue.  HENT: Negative.    Eyes: Negative.   Respiratory:  Positive for cough and shortness of breath. Negative for chest tightness.   Cardiovascular:  Negative for chest pain, palpitations and leg swelling.  Gastrointestinal:  Negative for abdominal distention, abdominal pain, constipation, diarrhea, nausea and vomiting.  Musculoskeletal: Negative.   Skin: Negative.   Neurological: Negative.   Psychiatric/Behavioral: Negative.      Objective:  Physical Exam Constitutional:      Appearance: She is well-developed.  HENT:     Head: Normocephalic and atraumatic.  Cardiovascular:     Rate and Rhythm: Normal rate and regular rhythm.  Pulmonary:     Effort: Pulmonary effort is normal. No respiratory distress.     Breath sounds: Normal breath sounds. No wheezing or rales.  Abdominal:     General: Bowel sounds are normal. There is no distension.     Palpations: Abdomen is soft.     Tenderness: There is no abdominal tenderness. There is no rebound.  Musculoskeletal:     Cervical back: Normal range of motion.  Skin:    General: Skin is warm and dry.  Neurological:     Mental Status: She is alert and oriented to person, place, and time.     Coordination: Coordination normal.     Vitals:   12/31/21 1017  BP: 124/80  Pulse: 87  Resp: 18  SpO2: 98%  Weight: 200 lb 12.8 oz (91.1 kg)  Height: 5\' 6"  (1.676 m)    Assessment & Plan:  Prevnar 20 given at visit

## 2021-12-31 NOTE — Assessment & Plan Note (Signed)
Checking CXR today and ordered myocardial perfusion study for potential blockages.
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
## 2022-01-21 ENCOUNTER — Telehealth (HOSPITAL_COMMUNITY): Payer: Self-pay | Admitting: Internal Medicine

## 2022-01-21 NOTE — Telephone Encounter (Signed)
We have attempted to contact the ordering providers office to obtain a prior authorization from the Referral Coordinator  for the ordered test: Myoview.  However, we have been unsuccesful. Order will be removed from the active order WQ. Once the prior authorization is obtained we will reinstate the order and schedule patient.    01/21/22 Order cancelled due to no Response from ordering MD OFFICE for PA#  01/14/22 Inbakset sent x 3 for PA#  01/07/22 Inbasket sent x 2 for PA#  12/31/21 Inbasket sent for PA#      Thank you

## 2022-01-22 ENCOUNTER — Encounter (INDEPENDENT_AMBULATORY_CARE_PROVIDER_SITE_OTHER): Payer: Self-pay | Admitting: Ophthalmology

## 2022-01-22 ENCOUNTER — Ambulatory Visit (INDEPENDENT_AMBULATORY_CARE_PROVIDER_SITE_OTHER): Payer: PPO | Admitting: Ophthalmology

## 2022-01-22 DIAGNOSIS — Z961 Presence of intraocular lens: Secondary | ICD-10-CM | POA: Diagnosis not present

## 2022-01-22 DIAGNOSIS — H442E3 Degenerative myopia with other maculopathy, bilateral eye: Secondary | ICD-10-CM

## 2022-01-22 DIAGNOSIS — H4312 Vitreous hemorrhage, left eye: Secondary | ICD-10-CM | POA: Diagnosis not present

## 2022-01-22 DIAGNOSIS — H3581 Retinal edema: Secondary | ICD-10-CM

## 2022-01-22 DIAGNOSIS — H5213 Myopia, bilateral: Secondary | ICD-10-CM

## 2022-01-22 DIAGNOSIS — H35033 Hypertensive retinopathy, bilateral: Secondary | ICD-10-CM

## 2022-01-22 DIAGNOSIS — H532 Diplopia: Secondary | ICD-10-CM | POA: Diagnosis not present

## 2022-01-22 DIAGNOSIS — I10 Essential (primary) hypertension: Secondary | ICD-10-CM | POA: Diagnosis not present

## 2022-01-22 DIAGNOSIS — H43813 Vitreous degeneration, bilateral: Secondary | ICD-10-CM | POA: Diagnosis not present

## 2022-01-24 DIAGNOSIS — H532 Diplopia: Secondary | ICD-10-CM | POA: Diagnosis not present

## 2022-01-24 DIAGNOSIS — Z961 Presence of intraocular lens: Secondary | ICD-10-CM | POA: Diagnosis not present

## 2022-01-24 DIAGNOSIS — H4423 Degenerative myopia, bilateral: Secondary | ICD-10-CM | POA: Diagnosis not present

## 2022-01-30 ENCOUNTER — Other Ambulatory Visit: Payer: Self-pay | Admitting: Internal Medicine

## 2022-02-14 ENCOUNTER — Ambulatory Visit: Payer: Self-pay | Admitting: Licensed Clinical Social Worker

## 2022-02-14 NOTE — Patient Outreach (Signed)
  Care Coordination  Initial Visit Note   02/14/2022 Name: Angela Ramirez MRN: [id number] DOB: [birth year]  Angela Ramirez is a 76 y.o. year old female who sees Myrlene Broker, MD for primary care. I spoke with  Nydia Bouton by phone today  What matters to the patients health and wellness today?    Patient reports no concerns or needs from Care Coordination team with health and wellness related to physical or mental heath. .     Goals Addressed             This Visit's Progress    COMPLETED: Care Coordination Activies / No Follow up Required       Care Coordination Interventions: Provided education to patient re: Care Coordination Services Declined at this time         SDOH assessments and interventions completed:  No   Care Coordination Interventions Activated:  Yes  Care Coordination Interventions:  Yes, provided   Follow up plan: No further intervention required.   Encounter Outcome:  Pt. Visit Completed   Sammuel Hines, [facility] Care Coordination  [facility] [phone]

## 2022-02-14 NOTE — Patient Instructions (Signed)
Visit Information  Thank you for taking time to talk with me today. Please don't hesitate to contact me if I can be of assistance to you.   Following are the goals we discussed today:   Goals Addressed             This Visit's Progress    COMPLETED: Care Coordination Activies / No Follow up Required       Care Coordination Interventions: Provided education to patient re: Care Coordination Services Declined at this time         Please call the care guide team at [phone] if you need to schedule a phone appointment with the care coordination team ( Social work or Nurse).   Patient verbalizes understanding of instructions and care plan provided today and agrees to view in MyChart. Active MyChart status and patient understanding of how to access instructions and care plan via MyChart confirmed with patient.     No further follow up required: By Care Coordination team  Sammuel Hines, [facility] Care Coordination  [facility] [phone]

## 2022-02-14 NOTE — Patient Outreach (Signed)
  Care Coordination   02/14/2022 Name: Angela Ramirez MRN: [id number] DOB: 1945/11/08   Care Coordination Outreach Attempts:  An unsuccessful telephone outreach was attempted today to offer the patient information about available care coordination services as a benefit of their health plan.   Follow Up Plan:  Additional outreach attempts will be made to offer the patient care coordination information and services.   Encounter Outcome:  No Answer  Care Coordination Interventions Activated:  No   Care Coordination Interventions:  No, not indicated    Sammuel Hines, [facility] Care Coordination  [facility] [employer] [phone]

## 2022-03-20 ENCOUNTER — Telehealth: Payer: Self-pay | Admitting: Internal Medicine

## 2022-03-20 NOTE — Telephone Encounter (Signed)
LVM for pt to rtn my call to schedule AWV with [facility] call back # [phone] 

## 2022-04-03 ENCOUNTER — Telehealth: Payer: Self-pay

## 2022-04-03 NOTE — Telephone Encounter (Signed)
Spoke with the patient, detailed instructions given. She stated that she would be here for her test. Asked to call back with any questions. S.Ashya Nicolaisen EMTP 

## 2022-04-09 ENCOUNTER — Ambulatory Visit (HOSPITAL_COMMUNITY): Payer: PPO | Attending: Internal Medicine

## 2022-04-09 DIAGNOSIS — R0602 Shortness of breath: Secondary | ICD-10-CM | POA: Diagnosis not present

## 2022-04-09 LAB — MYOCARDIAL PERFUSION IMAGING
LV dias vol: 63 mL (ref 46–106)
LV sys vol: 24 mL
Nuc Stress EF: 62 %
Peak HR: 106 {beats}/min
Rest HR: 90 {beats}/min
Rest Nuclear Isotope Dose: 10.3 mCi
SDS: 5
SRS: 0
SSS: 5
ST Depression (mm): 0 mm
Stress Nuclear Isotope Dose: 29.6 mCi
TID: 0.96

## 2022-04-09 MED ORDER — REGADENOSON 0.4 MG/5ML IV SOLN
0.4000 mg | Freq: Once | INTRAVENOUS | Status: AC
  Administered 2022-04-09: 0.4 mg via INTRAVENOUS

## 2022-04-09 MED ORDER — TECHNETIUM TC 99M TETROFOSMIN IV KIT
29.6000 | PACK | Freq: Once | INTRAVENOUS | Status: AC | PRN
  Administered 2022-04-09: 29.6 via INTRAVENOUS

## 2022-04-09 MED ORDER — TECHNETIUM TC 99M TETROFOSMIN IV KIT
10.3000 | PACK | Freq: Once | INTRAVENOUS | Status: AC | PRN
  Administered 2022-04-09: 10.3 via INTRAVENOUS

## 2022-04-10 ENCOUNTER — Ambulatory Visit (HOSPITAL_COMMUNITY): Payer: PPO

## 2022-04-22 ENCOUNTER — Encounter: Payer: Self-pay | Admitting: Internal Medicine

## 2022-04-22 ENCOUNTER — Ambulatory Visit (INDEPENDENT_AMBULATORY_CARE_PROVIDER_SITE_OTHER): Payer: PPO | Admitting: Internal Medicine

## 2022-04-22 VITALS — BP 148/64 | HR 76 | Temp 98.0°F | Ht 66.0 in | Wt 205.0 lb

## 2022-04-22 DIAGNOSIS — M545 Low back pain, unspecified: Secondary | ICD-10-CM

## 2022-04-22 DIAGNOSIS — F5104 Psychophysiologic insomnia: Secondary | ICD-10-CM

## 2022-04-22 DIAGNOSIS — I739 Peripheral vascular disease, unspecified: Secondary | ICD-10-CM | POA: Diagnosis not present

## 2022-04-22 MED ORDER — TIZANIDINE HCL 2 MG PO TABS: 2.0000 mg | ORAL_TABLET | Freq: Two times a day (BID) | ORAL | 6 refills | Status: DC | PRN

## 2022-04-22 MED ORDER — TEMAZEPAM 15 MG PO CAPS: 15.0000 mg | ORAL_CAPSULE | Freq: Two times a day (BID) | ORAL | 4 refills | Status: DC | PRN

## 2022-04-22 MED ORDER — TRAMADOL HCL 50 MG PO TABS: 50.0000 mg | ORAL_TABLET | Freq: Every day | ORAL | 5 refills | Status: DC | PRN

## 2022-04-22 NOTE — Assessment & Plan Note (Signed)
Uses tizanidine and tramadol for pain as needed. Refilled both at visit today.

## 2022-04-22 NOTE — Assessment & Plan Note (Signed)
Pain on walking long distances in stores. Okay around the house. No prior ABI so ordered today. Is normal can assess other causes.

## 2022-04-22 NOTE — Assessment & Plan Note (Signed)
Uses temazepam and refilled today. #60 with 4 refills.

## 2022-04-22 NOTE — Patient Instructions (Signed)
We will check the blood flow in the legs.  

## 2022-04-22 NOTE — Progress Notes (Signed)
   Subjective:   Patient ID: Angela Ramirez, female    DOB: Feb 26, 1946, 76 y.o.   MRN: [id number]  HPI The patient is a 76 YO female coming in for follow up and pain with walking.  Review of Systems  Constitutional: Negative.   HENT: Negative.    Eyes: Negative.   Respiratory:  Negative for cough, chest tightness and shortness of breath.   Cardiovascular:  Negative for chest pain, palpitations and leg swelling.  Gastrointestinal:  Negative for abdominal distention, abdominal pain, constipation, diarrhea, nausea and vomiting.  Musculoskeletal: Negative.   Skin: Negative.   Neurological: Negative.   Psychiatric/Behavioral: Negative.      Objective:  Physical Exam Constitutional:      Appearance: She is well-developed.  HENT:     Head: Normocephalic and atraumatic.  Cardiovascular:     Rate and Rhythm: Normal rate and regular rhythm.  Pulmonary:     Effort: Pulmonary effort is normal. No respiratory distress.     Breath sounds: Normal breath sounds. No wheezing or rales.  Abdominal:     General: Bowel sounds are normal. There is no distension.     Palpations: Abdomen is soft.     Tenderness: There is no abdominal tenderness. There is no rebound.  Musculoskeletal:     Cervical back: Normal range of motion.  Skin:    General: Skin is warm and dry.  Neurological:     Mental Status: She is alert and oriented to person, place, and time.     Coordination: Coordination normal.     Vitals:   04/22/22 1030 04/22/22 1033  BP: (!) 148/64 (!) 148/64  Pulse: 76   Temp: 98 F (36.7 C)   TempSrc: Oral   SpO2: 94%   Weight: 205 lb (93 kg)   Height: 5\' 6"  (1.676 m)     Assessment & Plan:

## 2022-04-26 NOTE — Patient Instructions (Signed)

## 2022-04-26 NOTE — Progress Notes (Unsigned)
Subjective:   Angela Ramirez is a 76 y.o. female who presents for Medicare Annual (Subsequent) preventive examination. I connected with  Angela Ramirez on 04/26/22 by a audio enabled telemedicine application and verified that I am speaking with the correct person using two identifiers.  Patient Location: Home  Provider Location: Home Office  I discussed the limitations of evaluation and management by telemedicine. The patient expressed understanding and agreed to proceed.  Review of Systems    Deferred to PCP       Objective:    There were no vitals filed for this visit. There is no height or weight on file to calculate BMI.      No data to display           Current Medications (verified) Outpatient Encounter Medications as of 04/29/2022  Medication Sig   Acetaminophen-Caffeine 500-65 MG TABS Take 1 tablet by mouth every 6 (six) hours as needed (headache). Tension relief   amLODipine (NORVASC) 10 MG tablet Take 1 tablet (10 mg total) by mouth daily.   hydrochlorothiazide (HYDRODIURIL) 25 MG tablet Take 25 mg by mouth daily.   levalbuterol (XOPENEX HFA) 45 MCG/ACT inhaler Inhale 1-2 puffs into the lungs every 4 (four) hours as needed for wheezing.   LINZESS 145 MCG CAPS capsule TAKE 1 CAPSULE( 145 MCG) BY MOUTH DAILY BEFORE BREAKFAST   meloxicam (MOBIC) 15 MG tablet Take 1 tablet (15 mg total) by mouth daily.   pantoprazole (PROTONIX) 40 MG tablet Take 1 tablet (40 mg total) by mouth daily. Take 30-60 minutes before eating   temazepam (RESTORIL) 15 MG capsule Take 1 capsule (15 mg total) by mouth 2 (two) times daily as needed for sleep (anxiety).   tiZANidine (ZANAFLEX) 2 MG tablet Take 1 tablet (2 mg total) by mouth 2 (two) times daily as needed for muscle spasms.   traMADol (ULTRAM) 50 MG tablet Take 1 tablet (50 mg total) by mouth daily as needed.   No facility-administered encounter medications on file as of 04/29/2022.    Allergies (verified) Crab (diagnostic),  Morphine and related, and Sulfa antibiotics   History: Past Medical History:  Diagnosis Date   Anxiety    Aortic atherosclerosis (HCC)    Arnold-Chiari malformation (HCC)    Arthritis    Cirrhosis (HCC)    Colon polyps    Depression    Diverticulitis    Diverticulosis    Emphysema of lung (HCC) 12/02/2020   per CT scan   Headache    Hepatitis C    Hiatal hernia    Hypertension    Hypertensive retinopathy    IBS (irritable bowel syndrome)    Internal hemorrhoids    Sepsis (HCC) 11/2020   UTI (urinary tract infection)    Past Surgical History:  Procedure Laterality Date   BRAIN SURGERY  2003   decompression   BREAST EXCISIONAL BIOPSY Bilateral    age 31's   CATARACT EXTRACTION     DILATION AND CURETTAGE OF UTERUS     EYE SURGERY     OVARIAN CYST REMOVAL Bilateral    REDUCTION MAMMAPLASTY Bilateral    25+ years ago   Family History  Problem Relation Age of Onset   Arthritis Mother    Hypertension Mother    Kidney disease Mother    Diabetes Mother    Alcohol abuse Father    Arthritis Father    Lung cancer Father    Hypertension Sister    Rheum arthritis Sister  Diabetes Maternal Grandmother    Breast cancer Paternal Grandmother    Hypertension Daughter    Parkinson's disease Son    Diabetes Maternal Aunt    Kidney failure Maternal Aunt    Diabetes Maternal Uncle    Breast cancer Paternal Aunt    Breast cancer Paternal Aunt    Colon cancer Neg Hx    Stomach cancer Neg Hx    Esophageal cancer Neg Hx    Social History   Socioeconomic History   Marital status: Widowed    Spouse name: Not on file   Number of children: 3   Years of education: Not on file   Highest education level: Not on file  Occupational History   Occupation: retired  Tobacco Use   Smoking status: Former    Types: Cigarettes    Quit date: 09/29/1969    Years since quitting: 52.6   Smokeless tobacco: Never  Vaping Use   Vaping Use: Never used  Substance and Sexual Activity    Alcohol use: Yes    Alcohol/week: 0.0 standard drinks of alcohol    Comment: 2 glasses of Angela Ramirez a week   Drug use: No   Sexual activity: Not Currently  Other Topics Concern   Not on file  Social History Narrative   ** Merged History Encounter **       Social Determinants of Health   Financial Resource Strain: Not on file  Food Insecurity: Not on file  Transportation Needs: Not on file  Physical Activity: Not on file  Stress: Not on file  Social Connections: Not on file    Tobacco Counseling Counseling given: Not Answered   Clinical Intake:                 Diabetic?No         Activities of Daily Living     No data to display           Patient Care Team: Myrlene Broker, MD as PCP - General (Internal Medicine) Kathyrn Sheriff, [facility] as Pharmacist (Pharmacist)  Indicate any recent Medical Services you may have received from other than [facility] providers in the past year (date may be approximate).     Assessment:   This is a routine wellness examination for Angela Ramirez.  Hearing/Vision screen No results found.  Dietary issues and exercise activities discussed:     Goals Addressed   None   Depression Screen    12/31/2021   10:29 AM 10/16/2021    9:16 AM 04/11/2021   10:26 AM 01/05/2021   10:47 AM 08/24/2020    9:02 AM 07/14/2020   10:29 AM 02/18/2019   11:01 AM  PHQ 2/9 Scores  PHQ - 2 Score 0 3 0 0 0 0 1  PHQ- 9 Score 0 8 0 0 0      Fall Risk    04/22/2022   10:27 AM 12/31/2021   10:29 AM 10/16/2021    9:17 AM 07/14/2020   10:28 AM 02/18/2019   11:01 AM  Fall Risk   Falls in the past year? 0 0 0 0 0  Number falls in past yr: 0 0 0 0   Injury with Fall? 0 0 0 0     FALL RISK PREVENTION PERTAINING TO THE HOME:  Any stairs in or around the home? {YES/NO:21197} If so, are there any without handrails? {YES/NO:21197} Home free of loose throw rugs in walkways, pet beds, electrical cords, etc? {YES/NO:21197} Adequate lighting in your  home to reduce risk of falls? {YES/NO:21197}  ASSISTIVE DEVICES UTILIZED TO PREVENT FALLS:  Life alert? {YES/NO:21197} Use of a cane, walker or w/c? {YES/NO:21197} Grab bars in the bathroom? {YES/NO:21197} Shower chair or bench in shower? {YES/NO:21197} Elevated toilet seat or a handicapped toilet? {YES/NO:21197}  Cognitive Function:        Immunizations Immunization History  Administered Date(s) Administered   Fluad Quad(high Dose 65+) 02/18/2019   Hepatitis A, Adult 05/07/2018   Hepatitis B, adult 10/09/2017, 11/06/2017, 04/21/2018   Influenza, High Dose Seasonal PF 08/18/2017, 05/07/2018   PFIZER(Purple Top)SARS-COV-2 Vaccination 07/16/2019, 08/06/2019   PNEUMOCOCCAL CONJUGATE-20 12/31/2021   Pneumococcal Conjugate-13 01/11/2016   Pneumococcal Polysaccharide-23 09/05/2012, 05/07/2018   Tdap 01/11/2016    TDAP status: Up to date  Flu Vaccine status: Due, Education has been provided regarding the importance of this vaccine. Advised may receive this vaccine at local pharmacy or Health Dept. Aware to provide a copy of the vaccination record if obtained from local pharmacy or Health Dept. Verbalized acceptance and understanding.  Pneumococcal vaccine status: Up to date  Covid-19 vaccine status: Information provided on how to obtain vaccines.   Qualifies for Shingles Vaccine? Yes   Zostavax completed No   Shingrix Completed?: No.    Education has been provided regarding the importance of this vaccine. Patient has been advised to call insurance company to determine out of pocket expense if they have not yet received this vaccine. Advised may also receive vaccine at local pharmacy or Health Dept. Verbalized acceptance and understanding.  Screening Tests Health Maintenance  Topic Date Due   Zoster Vaccines- Shingrix (1 of 2) Never done   DEXA SCAN  Never done   COVID-19 Vaccine (3 - Pfizer series) 10/01/2019   INFLUENZA VACCINE  01/22/2022   MAMMOGRAM  04/13/2022   Medicare  Annual Wellness (AWV)  04/30/2023   TETANUS/TDAP  01/10/2026   COLONOSCOPY (Pts 45-5yrs Insurance coverage will need to be confirmed)  10/31/2029   Pneumonia Vaccine 64+ Years old  Completed   Hepatitis C Screening  Completed   HPV VACCINES  Aged Out    Health Maintenance  Health Maintenance Due  Topic Date Due   Zoster Vaccines- Shingrix (1 of 2) Never done   DEXA SCAN  Never done   COVID-19 Vaccine (3 - Pfizer series) 10/01/2019   INFLUENZA VACCINE  01/22/2022   MAMMOGRAM  04/13/2022    Colorectal cancer screening: Type of screening: Colonoscopy. Completed 11/01/19. Repeat every 10 years  {Mammogram status:21018020}  {Bone Density status:21018021}  Lung Cancer Screening: (Low Dose CT Chest recommended if Age 57-80 years, 30 pack-year currently smoking OR have quit w/in 15years.) does not qualify.   Additional Screening:  Hepatitis C Screening: does qualify; Completed 11/26/19  Vision Screening: Recommended annual ophthalmology exams for early detection of glaucoma and other disorders of the eye. Is the patient up to date with their annual eye exam?  Yes  Who is the provider or what is the name of the office in which the patient attends annual eye exams? Dr. Vanessa Barbara If pt is not established with a provider, would they like to be referred to a provider to establish care?  N/A .   Dental Screening: Recommended annual dental exams for proper oral hygiene  Community Resource Referral / Chronic Care Management: CRR required this visit?  {YES/NO:21197}  CCM required this visit?  {YES/NO:21197}     Plan:     I have personally reviewed and noted the following in the patient's chart:  Medical and social history Use of alcohol, tobacco or illicit drugs  Current medications and supplements including opioid prescriptions. Patient is currently taking opioid prescriptions. Information provided to patient regarding non-opioid alternatives. Patient advised to discuss non-opioid  treatment plan with their provider. Functional ability and status Nutritional status Physical activity Advanced directives List of other physicians Hospitalizations, surgeries, and ER visits in previous 12 months Vitals Screenings to include cognitive, depression, and falls Referrals and appointments  In addition, I have reviewed and discussed with patient certain preventive protocols, quality metrics, and best practice recommendations. A written personalized care plan for preventive services as well as general preventive health recommendations were provided to patient.     Wanda Plump, RN   04/26/2022   Nurse Notes:  Ms. Rubert , Thank you for taking time to come for your Medicare Wellness Visit. I appreciate your ongoing commitment to your health goals. Please review the following plan we discussed and let me know if I can assist you in the future.   These are the goals we discussed:  Goals   None     This is a list of the screening recommended for you and due dates:  Health Maintenance  Topic Date Due   Zoster (Shingles) Vaccine (1 of 2) Never done   DEXA scan (bone density measurement)  Never done   COVID-19 Vaccine (3 - Pfizer series) 10/01/2019   Flu Shot  01/22/2022   Mammogram  04/13/2022   Medicare Annual Wellness Visit  04/30/2023   Tetanus Vaccine  01/10/2026   Colon Cancer Screening  10/31/2029   Pneumonia Vaccine  Completed   Hepatitis C Screening: USPSTF Recommendation to screen - Ages 18-79 yo.  Completed   HPV Vaccine  Aged Out

## 2022-04-28 IMAGING — MR MR HEAD W/O CM
7 of 10 series · 31 of 48 positions shown · non-contrast
Comparison: Head CT 2 days ago.

CLINICAL DATA: History of nausea and vomiting. Altered mental
status. Left eye vision disturbance.

EXAM:
MRI HEAD WITHOUT CONTRAST
TECHNIQUE: Multiplanar, multiecho pulse sequences of the brain and surrounding
structures were obtained without intravenous contrast.

[Series 2: DWI · axial · 3.0mm · 0.94mm/px · z∈[-94,+52]mm · 9 of 100 slices shown (1 of 2)]
[im 1/100]
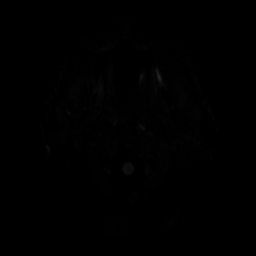
[im 13/100]
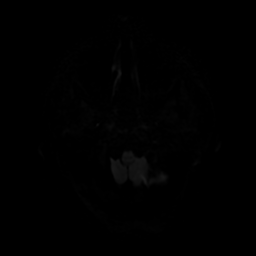
[im 25/100]
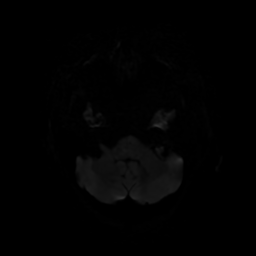
[im 38/100]
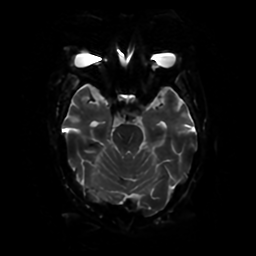
[im 50/100]
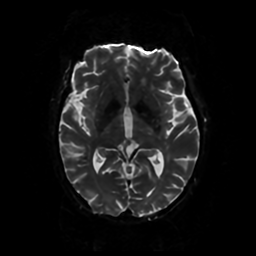
[im 62/100]
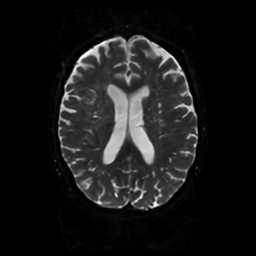
[im 75/100]
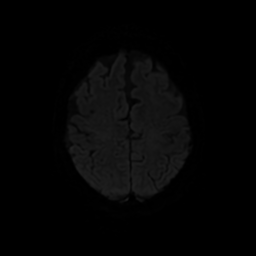
[im 87/100]
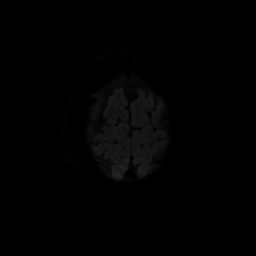
[im 100/100]
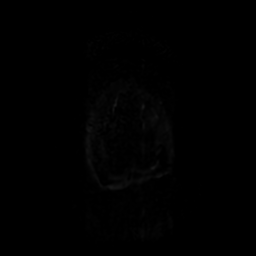

[Series 3: DWI · coronal · 4.0mm · 0.94mm/px · 7 of 70 slices shown (2 of 2)]
[im 1/70]
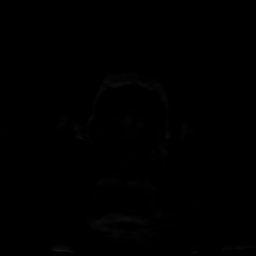
[im 12/70]
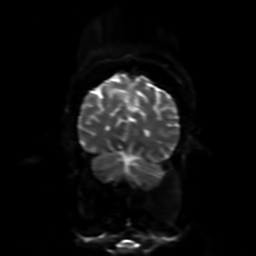
[im 24/70]
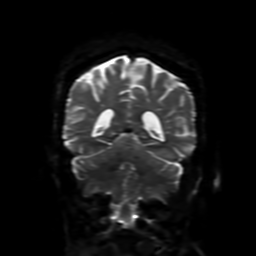
[im 35/70]
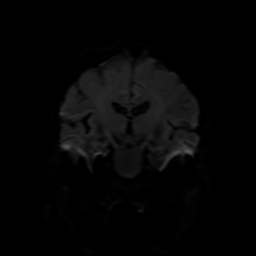
[im 47/70]
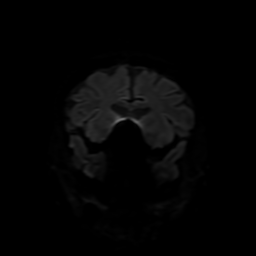
[im 58/70]
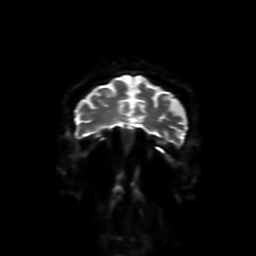
[im 70/70]
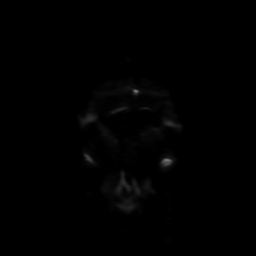

[Series 4: FLAIR · sagittal · 5.0mm · 0.23mm/px · 2 of 23 slices shown (1 of 2)]
[im 1/23]
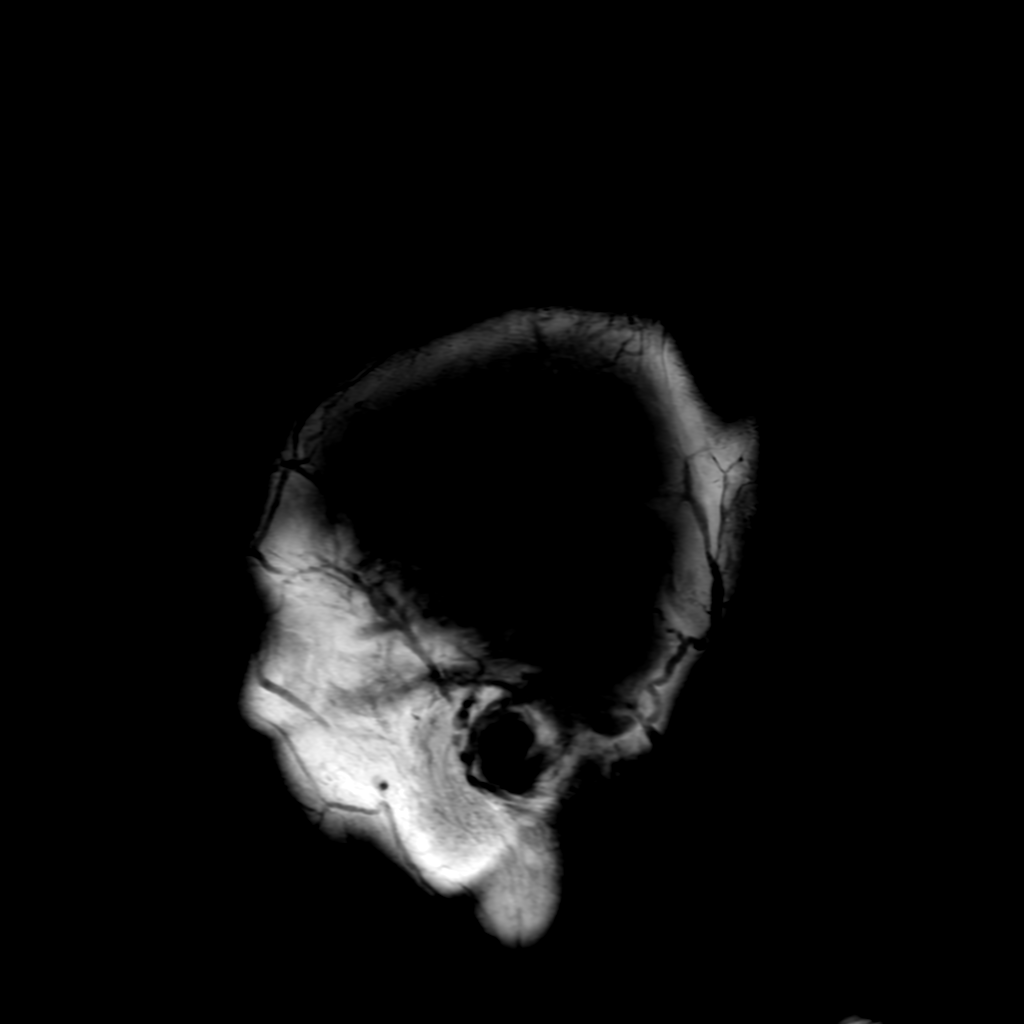
[im 23/23]
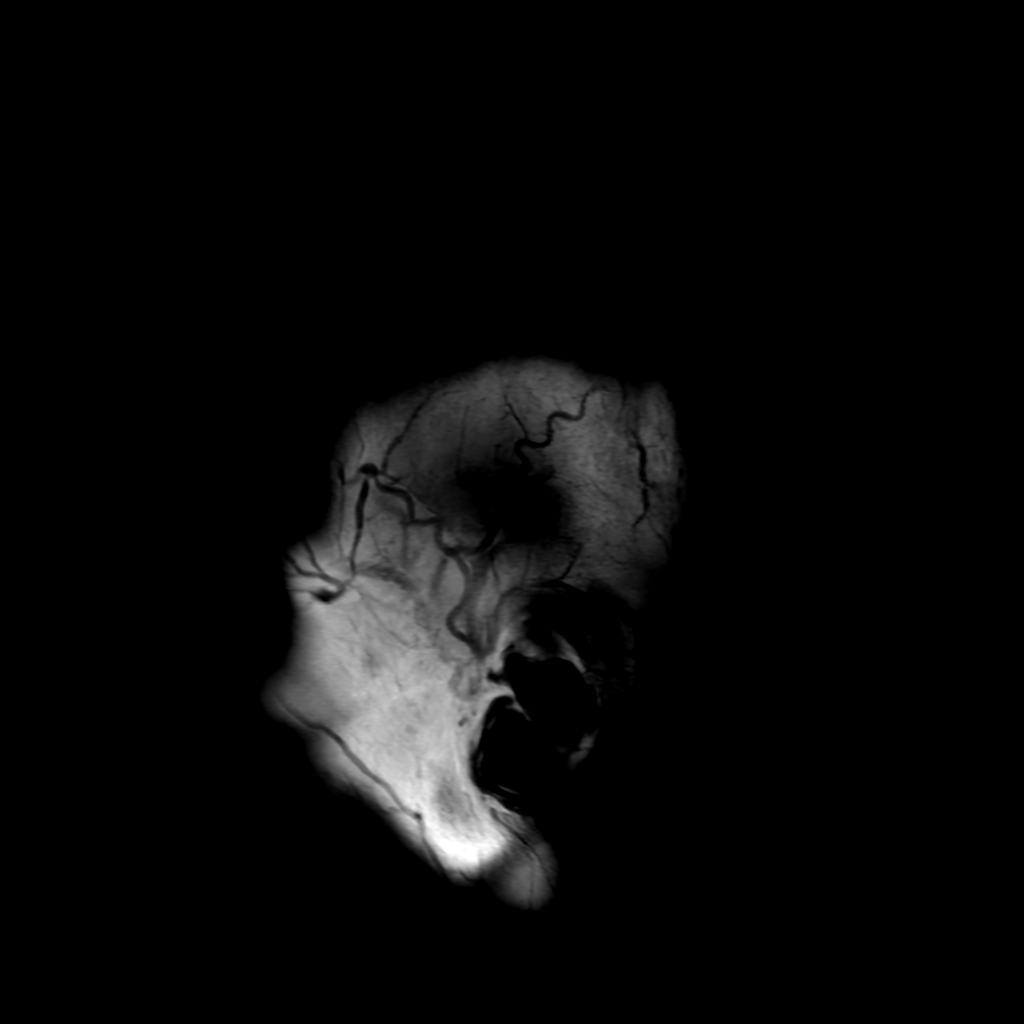

[Series 7: FLAIR · axial · 4.0mm · 0.45mm/px · z∈[-94,+51]mm · 3 of 34 slices shown (2 of 2)]
[im 1/34]
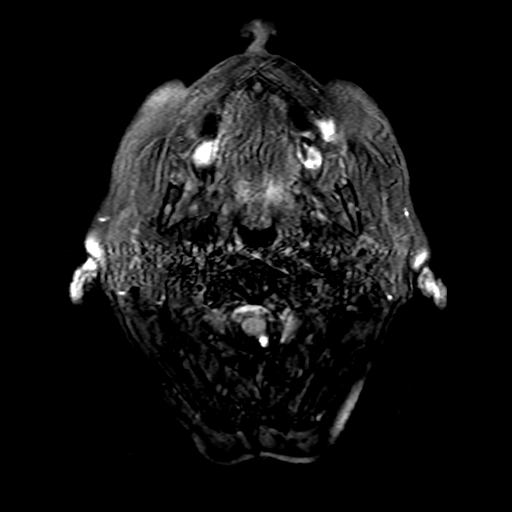
[im 17/34]
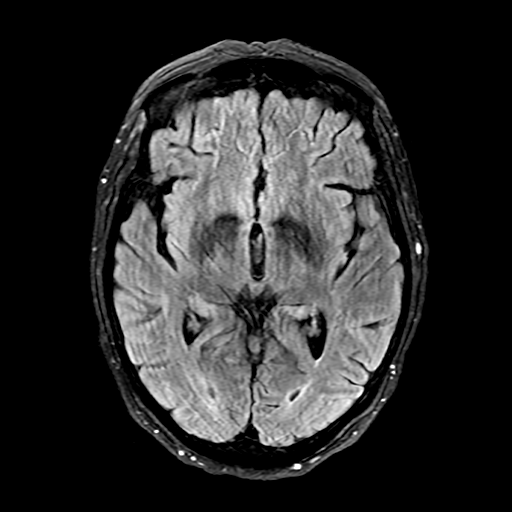
[im 34/34]
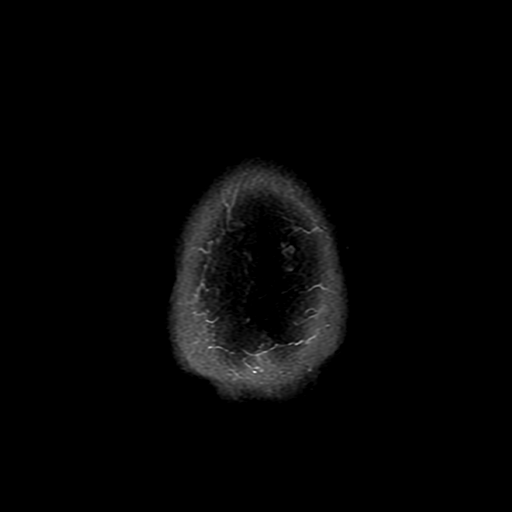

[Series 10: T2 · coronal · 5.0mm · 0.39mm/px · 2 of 29 slices shown]
[im 1/29]
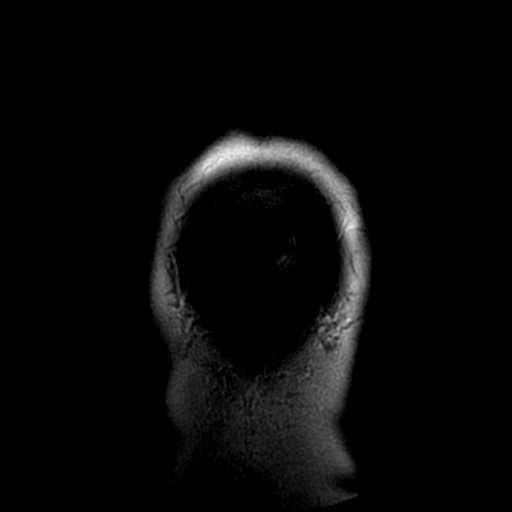
[im 15/29]
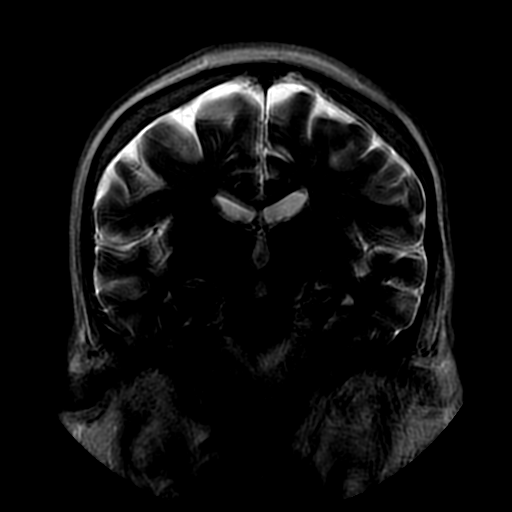

[Series 250: ADC · axial · 3.0mm · 0.94mm/px · z∈[-94,+52]mm · 5 of 50 slices shown (1 of 2)]
[im 1/50]
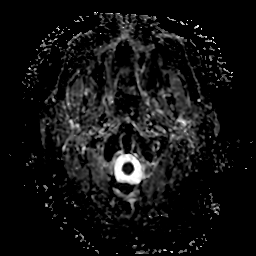
[im 13/50]
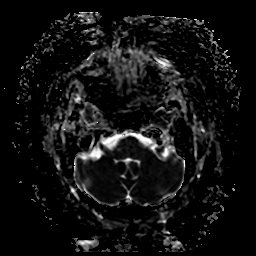
[im 25/50]
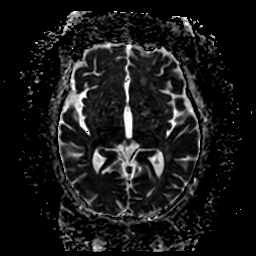
[im 37/50]
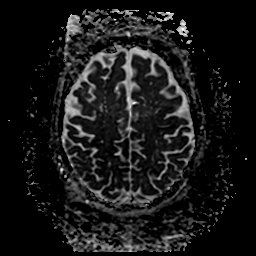
[im 50/50]
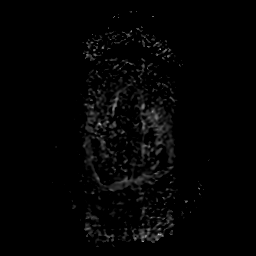

[Series 350: ADC · coronal · 4.0mm · 0.94mm/px · 3 of 35 slices shown (2 of 2)]
[im 1/35]
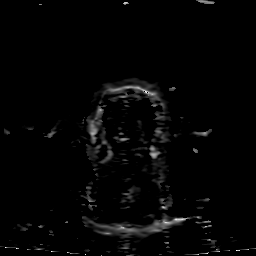
[im 18/35]
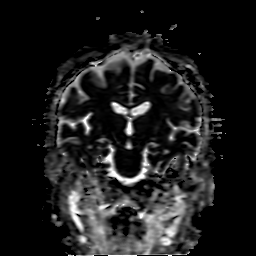
[im 35/35]
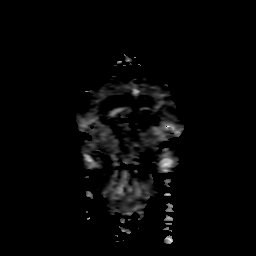

[31 of 48 positions shown; findings below may reference images not displayed]

FINDINGS: The study suffers from moderate motion degradation.

Brain: Diffusion imaging does not show any acute or subacute
infarction. The brainstem and cerebellum are normal. Cerebral
hemispheres are normal for age. No sign of small vessel change. No
cortical or large vessel territory infarction. Mass lesion,
hemorrhage, hydrocephalus or extra-axial collection.

Vascular: Major vessels at the base of the brain show flow.

Skull and upper cervical spine: Negative

Sinuses/Orbits: Clear/normal

Other: None
IMPRESSION: Motion degraded study. No acute abnormality suspected. No
abnormality seen to explain the presenting symptoms. Brain appears
normal for age.

## 2022-04-29 ENCOUNTER — Ambulatory Visit (HOSPITAL_COMMUNITY): Admission: RE | Admit: 2022-04-29 | Payer: PPO | Source: Ambulatory Visit

## 2022-04-29 ENCOUNTER — Ambulatory Visit: Payer: PPO | Admitting: *Deleted

## 2022-04-29 DIAGNOSIS — Z0000 Encounter for general adult medical examination without abnormal findings: Secondary | ICD-10-CM

## 2022-05-03 ENCOUNTER — Ambulatory Visit (HOSPITAL_COMMUNITY)
Admission: RE | Admit: 2022-05-03 | Discharge: 2022-05-03 | Disposition: A | Discharge status: Home / Self Care | Payer: PPO | Source: Ambulatory Visit | Attending: Internal Medicine | Admitting: Internal Medicine

## 2022-05-03 DIAGNOSIS — I739 Peripheral vascular disease, unspecified: Secondary | ICD-10-CM | POA: Diagnosis not present

## 2022-05-13 ENCOUNTER — Ambulatory Visit (INDEPENDENT_AMBULATORY_CARE_PROVIDER_SITE_OTHER): Payer: PPO | Admitting: Internal Medicine

## 2022-05-13 ENCOUNTER — Ambulatory Visit (INDEPENDENT_AMBULATORY_CARE_PROVIDER_SITE_OTHER): Payer: PPO

## 2022-05-13 ENCOUNTER — Encounter: Payer: Self-pay | Admitting: Internal Medicine

## 2022-05-13 VITALS — BP 168/80 | HR 82 | Temp 98.1°F | Ht 66.0 in | Wt 205.1 lb

## 2022-05-13 DIAGNOSIS — M25561 Pain in right knee: Secondary | ICD-10-CM | POA: Diagnosis not present

## 2022-05-13 DIAGNOSIS — G8929 Other chronic pain: Secondary | ICD-10-CM | POA: Diagnosis not present

## 2022-05-13 DIAGNOSIS — M25562 Pain in left knee: Secondary | ICD-10-CM

## 2022-05-13 DIAGNOSIS — Z23 Encounter for immunization: Secondary | ICD-10-CM

## 2022-05-13 MED ORDER — TRAMADOL HCL 50 MG PO TABS: 50.0000 mg | ORAL_TABLET | Freq: Two times a day (BID) | ORAL | 5 refills | Status: DC | PRN

## 2022-05-13 MED ORDER — TIZANIDINE HCL 2 MG PO TABS: 2.0000 mg | ORAL_TABLET | Freq: Three times a day (TID) | ORAL | 6 refills | Status: DC | PRN

## 2022-05-13 NOTE — Assessment & Plan Note (Signed)
Checking x-ray knees as she has never had these. She is having primarily upper leg pain and worse with walking more and gets worse throughout the day. Treat as appropriate. Discussed with her normal arterial blood flow screening.

## 2022-05-13 NOTE — Progress Notes (Signed)
   Subjective:   Patient ID: Angela Ramirez, female    DOB: 08-08-1945, 76 y.o.   MRN: [id number]  HPI The patient is a 76 YO female coming in for questions about recent arterial flow study on legs. Still having leg pain. Worse with walking more or doing more.  Review of Systems  Constitutional: Negative.   HENT: Negative.    Eyes: Negative.   Respiratory:  Negative for cough, chest tightness and shortness of breath.   Cardiovascular:  Negative for chest pain, palpitations and leg swelling.  Gastrointestinal:  Negative for abdominal distention, abdominal pain, constipation, diarrhea, nausea and vomiting.  Musculoskeletal:  Positive for arthralgias and myalgias.  Skin: Negative.   Neurological: Negative.   Psychiatric/Behavioral: Negative.      Objective:  Physical Exam Constitutional:      Appearance: She is well-developed.  HENT:     Head: Normocephalic and atraumatic.  Cardiovascular:     Rate and Rhythm: Normal rate and regular rhythm.  Pulmonary:     Effort: Pulmonary effort is normal. No respiratory distress.     Breath sounds: Normal breath sounds. No wheezing or rales.  Abdominal:     General: Bowel sounds are normal. There is no distension.     Palpations: Abdomen is soft.     Tenderness: There is no abdominal tenderness. There is no rebound.  Musculoskeletal:        General: Tenderness present.     Cervical back: Normal range of motion.  Skin:    General: Skin is warm and dry.  Neurological:     Mental Status: She is alert and oriented to person, place, and time.     Coordination: Coordination normal.     Vitals:   05/13/22 0841 05/13/22 0849  BP: (!) 168/80 (!) 168/80  Pulse: 82   Temp: 98.1 F (36.7 C)   TempSrc: Oral   SpO2: 94%   Weight: 205 lb 2 oz (93 kg)   Height: 5\' 6"  (1.676 m)    Visit time 25 minutes in face to face communication with patient and coordination of care, additional 5 minutes spent in record review, coordination or care,  ordering tests, communicating/referring to other healthcare professionals, documenting in medical records all on the same day of the visit for total time 30 minutes spent on the visit.   Assessment & Plan:  Flu shot given at visit

## 2022-05-15 ENCOUNTER — Other Ambulatory Visit: Payer: Self-pay | Admitting: Internal Medicine

## 2022-05-15 DIAGNOSIS — G8929 Other chronic pain: Secondary | ICD-10-CM

## 2022-05-20 ENCOUNTER — Other Ambulatory Visit: Payer: Self-pay | Admitting: Internal Medicine

## 2022-05-20 NOTE — Progress Notes (Unsigned)
    [facility] [address] [place] [address] Phone: [phone]   Assessment and Plan:     There are no diagnoses linked to this encounter.  ***   Pertinent previous records reviewed include ***   Follow Up: ***     Subjective:   I, Angela Ramirez, am serving as a Neurosurgeon for Doctor Richardean Sale  Chief Complaint: bilateral knee pain   HPI:   05/21/2022 Patient is a 76 year old female complaining of bilateral knee pain. patient states  Relevant Historical Information: ***  Additional pertinent review of systems negative.   Current Outpatient Medications:    Acetaminophen-Caffeine 500-65 MG TABS, Take 1 tablet by mouth every 6 (six) hours as needed (headache). Tension relief, Disp: , Rfl:    amLODipine (NORVASC) 10 MG tablet, Take 1 tablet (10 mg total) by mouth daily., Disp: 90 tablet, Rfl: 3   hydrochlorothiazide (HYDRODIURIL) 25 MG tablet, Take 25 mg by mouth daily., Disp: , Rfl:    levalbuterol (XOPENEX HFA) 45 MCG/ACT inhaler, Inhale 1-2 puffs into the lungs every 4 (four) hours as needed for wheezing., Disp: 1 each, Rfl: 0   LINZESS 145 MCG CAPS capsule, TAKE 1 CAPSULE( 145 MCG) BY MOUTH DAILY BEFORE BREAKFAST, Disp: 90 capsule, Rfl: 1   meloxicam (MOBIC) 15 MG tablet, Take 1 tablet (15 mg total) by mouth daily., Disp: 30 tablet, Rfl: 1   pantoprazole (PROTONIX) 40 MG tablet, Take 1 tablet (40 mg total) by mouth daily. Take 30-60 minutes before eating, Disp: 90 tablet, Rfl: 5   temazepam (RESTORIL) 15 MG capsule, Take 1 capsule (15 mg total) by mouth 2 (two) times daily as needed for sleep (anxiety)., Disp: 60 capsule, Rfl: 4   tiZANidine (ZANAFLEX) 2 MG tablet, Take 1 tablet (2 mg total) by mouth every 8 (eight) hours as needed for muscle spasms., Disp: 90 tablet, Rfl: 6   traMADol (ULTRAM) 50 MG tablet, Take 1 tablet (50 mg total) by mouth every 12 (twelve) hours as needed., Disp: 60 tablet, Rfl: 5    Objective:     There were no vitals filed for this visit.    There is no height or weight on file to calculate BMI.    Physical Exam:    ***   Electronically signed by:  Angela Ramirez [facility] 12:14 PM 05/20/22

## 2022-05-21 ENCOUNTER — Ambulatory Visit: Payer: PPO | Admitting: Sports Medicine

## 2022-05-21 ENCOUNTER — Other Ambulatory Visit: Payer: Self-pay

## 2022-05-21 VITALS — BP 160/82 | HR 79 | Ht 66.0 in | Wt 204.0 lb

## 2022-05-21 DIAGNOSIS — M25562 Pain in left knee: Secondary | ICD-10-CM

## 2022-05-21 DIAGNOSIS — M25561 Pain in right knee: Secondary | ICD-10-CM

## 2022-05-21 DIAGNOSIS — M170 Bilateral primary osteoarthritis of knee: Secondary | ICD-10-CM | POA: Diagnosis not present

## 2022-05-21 DIAGNOSIS — G8929 Other chronic pain: Secondary | ICD-10-CM | POA: Diagnosis not present

## 2022-05-21 MED ORDER — HYDROCHLOROTHIAZIDE 25 MG PO TABS: 25.0000 mg | ORAL_TABLET | Freq: Every day | ORAL | 3 refills | Status: DC

## 2022-05-21 MED ORDER — AMLODIPINE BESYLATE 10 MG PO TABS: 10.0000 mg | ORAL_TABLET | Freq: Every day | ORAL | 3 refills | Status: DC

## 2022-05-21 NOTE — Patient Instructions (Addendum)
Good to see you  Voltaren gel topically over knees daily for pain relief Knee HEP  Pt referral  Recommend stationary bike, elliptical , ans water aerobics  4-6 week follow up

## 2022-05-21 NOTE — Telephone Encounter (Signed)
Tramadol was filled for 6 month supply at visit and not due for refill.

## 2022-05-28 NOTE — Therapy (Addendum)
OUTPATIENT PHYSICAL THERAPY LOWER EXTREMITY EVALUATION/DC SUMMARY   Patient Name: Angela Ramirez MRN: [id number] DOB:01-Apr-1946, 76 y.o., female Today's Date: 05/29/2022 PHYSICAL THERAPY DISCHARGE SUMMARY  Visits from Start of Care: 1  Current functional level related to goals / functional outcomes: UTA   Remaining deficits: UTA   Education / Equipment: HAP   Patient agrees to discharge. Patient goals were not met. Patient is being discharged due to not returning since the last visit.  END OF SESSION:  PT End of Session - 05/29/22 0956     Visit Number 1    Number of Visits 8    Date for PT Re-Evaluation 07/24/22    Authorization Type HTA    PT Start Time 1000    PT Stop Time 2122    PT Time Calculation (min) 45 min             Past Medical History:  Diagnosis Date   Anxiety    Aortic atherosclerosis ([place])    Arnold-Chiari malformation ([place])    Arthritis    Cirrhosis ([place])    Colon polyps    Depression    Diverticulitis    Diverticulosis    Emphysema of lung ([place]) 12/02/2020   per CT scan   Headache    Hepatitis C    Hiatal hernia    Hypertension    Hypertensive retinopathy    IBS (irritable bowel syndrome)    Internal hemorrhoids    Sepsis ([place]) 11/2020   UTI (urinary tract infection)    Past Surgical History:  Procedure Laterality Date   BRAIN SURGERY  2003   decompression   BREAST EXCISIONAL BIOPSY Bilateral    age 71's   CATARACT EXTRACTION     DILATION AND CURETTAGE OF UTERUS     EYE SURGERY     OVARIAN CYST REMOVAL Bilateral    REDUCTION MAMMAPLASTY Bilateral    25+ years ago   Patient Active Problem List   Diagnosis Date Noted   Chronic pain of both knees 05/13/2022   SOBOE (shortness of breath on exertion) 04/11/2021   Weakness acquired in ICU 01/26/2021   Painful urination 11/10/2020   Left foot pain 07/14/2020   Situational anxiety 07/14/2020   Allergic rhinitis 02/11/2020   Dysfunction of Eustachian tube, bilateral  02/11/2020   Vertigo 02/11/2020   Constipation 08/20/2019   GERD (gastroesophageal reflux disease) 02/19/2019   Finger pain, right 02/20/2018   Low back pain 02/20/2018   Liver fibrosis 10/09/2017   Hep C w/o coma, chronic ([place]) 08/26/2017   Routine general medical examination at a health care facility 08/18/2017   Right shoulder pain 08/18/2017   Essential hypertension 09/21/2015   Arnold-Chiari malformation ([place]) 09/21/2015   Insomnia 09/21/2015   PTSD (post-traumatic stress disorder) 09/21/2015    PCP: Hoyt Koch, MD   REFERRING PROVIDER: Glennon Mac, DO  REFERRING DIAG: M25.561,M25.562,G89.29 (ICD-10-CM) - Chronic pain of both knees M17.0 (ICD-10-CM) - Bilateral primary osteoarthritis of knee  THERAPY DIAG: 1. Chronic pain of both knees 2. Bilateral primary osteoarthritis of knee  Rationale for Evaluation and Treatment: Rehabilitation  ONSET DATE: chronic  SUBJECTIVE:   SUBJECTIVE STATEMENT: Relates B knee discomfort and difficulty standing due to weakness in quads.  Was hospitalized due to sepsis and developed severe deconditioning  PERTINENT HISTORY: 1. Chronic pain of both knees 2. Bilateral primary osteoarthritis of knee -Chronic with exacerbation, initial sports medicine visit - Consistent with flare of osteoarthritis based on x-ray imaging and worsening pain over the  past 4 to 6 weeks - Start Voltaren gel topically over knees daily for pain relief - Start HEP and physical therapy for knees - Recommend low impact exercises such as stationary bike, elliptical, water aerobics - Reviewed x-rays with patient and clinic.  My interpretation: No acute fracture or dislocation.  Moderate degenerative changes in medial compartments bilaterally with decreased joint space, sclerosis, spurring and moderate to severe changes in patellofemoral compartment with significantly decreased space - Discussed multiple alternative treatment options with patient.   Long-term Tylenol use is not ideal with history of chronic hep C.  Long-term NSAID use is not ideal with history of GI pathology including GERD, IBS, diverticulitis leading to sepsis with hospitalization last summer.  Offered CSI, but patient states that she has had weight gain with CSI in the past, so she declines today, but could be considered at future visit if pain worsens - Ambulatory referral to Physical Therapy    Pertinent previous records reviewed include left knee x-ray 05/13/2022, right knee x-ray 05/13/2022, internal medicine note 05/13/2022   Follow Up: 4 to 6 weeks for reevaluation.  If no improvement or worsening of symptoms, could consider bilateral intra-articular CSI PAIN:  PAIN:  Are you having pain? Yes: NPRS scale: 6/10 Pain location: B knees Pain description: ache  Aggravating factors: prolonged walking, sit to stand  Relieving factors: rest    PRECAUTIONS: None  WEIGHT BEARING RESTRICTIONS: No  FALLS:  Has patient fallen in last 6 months? No  LIVING ENVIRONMENT: Lives with: lives with their family  OCCUPATION: retired  PLOF: Independent  PATIENT GOALS: To regain my strength and return to walking  NEXT MD VISIT:   OBJECTIVE:   DIAGNOSTIC FINDINGS: none noted  PATIENT SURVEYS:  FOTO 47(62 predicted)   MUSCLE LENGTH: Hamstrings: Right 90 deg; Left 90 deg   POSTURE: No Significant postural limitations  PALPATION: Crepitus with active extension  LOWER EXTREMITY ROM:  Active ROM Right eval Left eval  Hip flexion 120d 120d  Hip extension 20d 20d  Hip abduction    Hip adduction    Hip internal rotation    Hip external rotation    Knee flexion 140d 135d  Knee extension 0d 0d  Ankle dorsiflexion    Ankle plantarflexion    Ankle inversion    Ankle eversion     (Blank rows = not tested)  LOWER EXTREMITY MMT:  MMT Right eval Left eval  Hip flexion 4 4  Hip extension    Hip abduction 4 4  Hip adduction    Hip internal rotation     Hip external rotation    Knee flexion 4 4  Knee extension 4- 4-  Ankle dorsiflexion    Ankle plantarflexion 4 4  Ankle inversion    Ankle eversion     (Blank rows = not tested)  LOWER EXTREMITY SPECIAL TESTS:  Knee special tests: Patellafemoral apprehension test: negative and Patellafemoral grind test: negative  FUNCTIONAL TESTS:  5 times sit to stand: 15s arm crossed  GAIT: Distance walked: 42f x2 Assistive device utilized: None Level of assistance: Complete Independence Comments: unremarkable   TODAY'S TREATMENT:  DATE: 05/29/22    PATIENT EDUCATION:  Education details: Discussed eval findings, rehab rationale and POC and patient is in agreement  Person educated: Patient Education method: Explanation Education comprehension: verbalized understanding and needs further education  HOME EXERCISE PROGRAM: Access Code: [email or web address] URL: [email or web address] Date: 05/29/2022 Prepared by: Sharlynn Oliphant  Exercises - Supine Bridge  - 2 x daily - 5 x weekly - 1 sets - 10 reps - Sidelying Hip Abduction  - 2 x daily - 5 x weekly - 1 sets - 10 reps - Supine Active Straight Leg Raise  - 2 x daily - 5 x weekly - 1 sets - 10 reps - Supine Quad Set  - 2 x daily - 5 x weekly - 1 sets - 10 reps - Standing Heel Raise with Support  - 2 x daily - 5 x weekly - 1 sets - 10 reps  ASSESSMENT:  CLINICAL IMPRESSION: Patient is a 76 y.o. female who was seen today for physical therapy evaluation and treatment for B knee pain.  Patient demos good ROM but shows strength deficits in B hips/knees/ankles.  No flexibility deficits identified.  Mild patellar crepitus noted R>L.  5x STS score functional but efficiency and body mechanics show need for improvement.  Overall pain levels are low.  Patient is a good candidate for OPPT to regain knee and LE strength and  function  OBJECTIVE IMPAIRMENTS: decreased endurance, difficulty walking, decreased strength, and pain.   ACTIVITY LIMITATIONS: lifting, bending, standing, squatting, and stairs  PERSONAL FACTORS: Age and Past/current experiences are also affecting patient's functional outcome.   REHAB POTENTIAL: Good  CLINICAL DECISION MAKING: Stable/uncomplicated  EVALUATION COMPLEXITY: Low   GOALS: Goals reviewed with patient? No  SHORT TERM GOALS: Target date: 06/12/2022     Patient to demonstrate independence in HEP Baseline:[email or web address] Goal status: INITIAL  2.  Decrease 5x STS time to <15s to demo improved body mechanics and biomechanical efficiency Baseline: 15s arms crossed Goal status: INITIAL    LONG TERM GOALS: Target date: 06/26/2022    Increase FOTO score to 67 Baseline: 42 Goal status: INITIAL  2.  Increase BLE sterngth to 4+/5 Baseline:  MMT Right eval Left eval  Hip flexion 4 4  Hip extension    Hip abduction 4 4  Hip adduction    Hip internal rotation    Hip external rotation    Knee flexion 4 4  Knee extension 4- 4-  Ankle dorsiflexion    Ankle plantarflexion 4 4   Goal status: INITIAL  3.  Decrease worst pain to 4/10 Baseline: 6/10 Goal status: INITIAL     PLAN:  PT FREQUENCY: 1-2x/week  PT DURATION: 6 weeks  PLANNED INTERVENTIONS: Therapeutic exercises, Therapeutic activity, Neuromuscular re-education, Balance training, Gait training, Patient/Family education, Self Care, Joint mobilization, Stair training, Manual therapy, and Re-evaluation  PLAN FOR NEXT SESSION: HEP review and update, B quad and hip strengthening, balance and proprioceptive training, aerobic work   Lanice Shirts, PT 05/29/2022, 10:46 AM

## 2022-05-29 ENCOUNTER — Telehealth: Payer: Self-pay

## 2022-05-29 ENCOUNTER — Other Ambulatory Visit: Payer: Self-pay

## 2022-05-29 ENCOUNTER — Ambulatory Visit: Payer: PPO | Attending: Sports Medicine

## 2022-05-29 DIAGNOSIS — M6281 Muscle weakness (generalized): Secondary | ICD-10-CM | POA: Diagnosis not present

## 2022-05-29 DIAGNOSIS — M25562 Pain in left knee: Secondary | ICD-10-CM | POA: Diagnosis not present

## 2022-05-29 DIAGNOSIS — G8929 Other chronic pain: Secondary | ICD-10-CM | POA: Insufficient documentation

## 2022-05-29 DIAGNOSIS — M25561 Pain in right knee: Secondary | ICD-10-CM | POA: Insufficient documentation

## 2022-05-29 DIAGNOSIS — M170 Bilateral primary osteoarthritis of knee: Secondary | ICD-10-CM | POA: Insufficient documentation

## 2022-05-29 NOTE — Telephone Encounter (Signed)
Patient has been approved by [employer] For Tramadol 50mg  tablet  Start date: 05/27/2022 End Date: 06/24/2023

## 2022-06-21 NOTE — Progress Notes (Deleted)
Angela Ramirez D.[facility] [address] [place] [address] Phone: [phone]   Assessment and Plan:     There are no diagnoses linked to this encounter.  ***   Pertinent previous records reviewed include ***   Follow Up: ***     Subjective:   I, Leonides Minder, am serving as a Neurosurgeon for Doctor Richardean Sale   Chief Complaint: bilateral knee pain    HPI:    05/21/2022 Patient is a 76 year old female complaining of bilateral knee pain. patient states that she is having pain in her thighs when she walks , notes weakness in he knee when she stands, been going on for a month , was taking tramadol , hx of gastric problem , is taking meloxicam every other day , is becoming more active , thinks this might be post sepsis pain , is losing weight   06/25/2022 Patient states    Relevant Historical Information: Hep C with liver fibrosis, GERD, diverticulosis, hypertension  Additional pertinent review of systems negative.   Current Outpatient Medications:    Acetaminophen-Caffeine 500-65 MG TABS, Take 1 tablet by mouth every 6 (six) hours as needed (headache). Tension relief, Disp: , Rfl:    amLODipine (NORVASC) 10 MG tablet, Take 1 tablet (10 mg total) by mouth daily., Disp: 90 tablet, Rfl: 3   hydrochlorothiazide (HYDRODIURIL) 25 MG tablet, Take 1 tablet (25 mg total) by mouth daily., Disp: 90 tablet, Rfl: 3   levalbuterol (XOPENEX HFA) 45 MCG/ACT inhaler, Inhale 1-2 puffs into the lungs every 4 (four) hours as needed for wheezing., Disp: 1 each, Rfl: 0   LINZESS 145 MCG CAPS capsule, TAKE 1 CAPSULE( 145 MCG) BY MOUTH DAILY BEFORE BREAKFAST, Disp: 90 capsule, Rfl: 1   meloxicam (MOBIC) 15 MG tablet, Take 1 tablet (15 mg total) by mouth daily., Disp: 30 tablet, Rfl: 1   pantoprazole (PROTONIX) 40 MG tablet, Take 1 tablet (40 mg total) by mouth daily. Take 30-60 minutes before eating, Disp: 90 tablet, Rfl: 5   temazepam (RESTORIL) 15 MG  capsule, Take 1 capsule (15 mg total) by mouth 2 (two) times daily as needed for sleep (anxiety)., Disp: 60 capsule, Rfl: 4   tiZANidine (ZANAFLEX) 2 MG tablet, Take 1 tablet (2 mg total) by mouth every 8 (eight) hours as needed for muscle spasms., Disp: 90 tablet, Rfl: 6   traMADol (ULTRAM) 50 MG tablet, Take 1 tablet (50 mg total) by mouth every 12 (twelve) hours as needed., Disp: 60 tablet, Rfl: 5   Objective:     There were no vitals filed for this visit.    There is no height or weight on file to calculate BMI.    Physical Exam:    ***   Electronically signed by:  Angela Ramirez [facility] 8:08 AM 06/21/22

## 2022-06-25 ENCOUNTER — Ambulatory Visit: Payer: PPO | Admitting: Sports Medicine

## 2022-07-01 NOTE — Progress Notes (Signed)
Angela Ramirez D.[facility] [address] [place] [address] Phone: [phone]   Assessment and Plan:     1. Chronic pain of both knees 2. Bilateral primary osteoarthritis of knee -Chronic with exacerbation, subsequent visit - Overall decrease in flare of osteoarthritis with patient using Voltaren gel as needed, HEP - Patient tried physical therapy and, however land-based PT significantly flared symptoms.  Patient request referral for aquatic therapy which she has benefited from in the past.  Referral sent today - May continue Voltaren gel topically as needed - Reviewed that long-term Tylenol use is not ideal with history of chronic hep C, long-term NSAID use is not ideal with history of GI pathology, IBS, GERD and episode of diverticulitis leading to sepsis.  Patient not currently interested in CSI due to history of weight gain with CSI, though could be considered at future office visit - May use turmeric and diet for general anti-inflammatory properties - Ambulatory referral to Physical Therapy  -Recommend stationary bike or elliptical or water therapy for activity  Pertinent previous records reviewed include none   Follow Up: As needed if no improvement or worsening of symptoms.  Could consider injection of cortisone versus HA versus PRP   Subjective:   I, Moenique Parris, am serving as a Neurosurgeon for Doctor Richardean Sale   Chief Complaint: bilateral knee pain    HPI:    05/21/2022 Patient is a 77 year old female complaining of bilateral knee pain. patient states that she is having pain in her thighs when she walks , notes weakness in he knee when she stands, been going on for a month , was taking tramadol , hx of gastric problem , is taking meloxicam every other day , is becoming more active , thinks this might be post sepsis pain , is losing weight   07/02/2022 Patient states that she is better ,not going back to PT     Relevant  Historical Information: Hep C with liver fibrosis, GERD, diverticulosis, hypertension  Additional pertinent review of systems negative.   Current Outpatient Medications:    Acetaminophen-Caffeine 500-65 MG TABS, Take 1 tablet by mouth every 6 (six) hours as needed (headache). Tension relief, Disp: , Rfl:    amLODipine (NORVASC) 10 MG tablet, Take 1 tablet (10 mg total) by mouth daily., Disp: 90 tablet, Rfl: 3   hydrochlorothiazide (HYDRODIURIL) 25 MG tablet, Take 1 tablet (25 mg total) by mouth daily., Disp: 90 tablet, Rfl: 3   levalbuterol (XOPENEX HFA) 45 MCG/ACT inhaler, Inhale 1-2 puffs into the lungs every 4 (four) hours as needed for wheezing., Disp: 1 each, Rfl: 0   LINZESS 145 MCG CAPS capsule, TAKE 1 CAPSULE( 145 MCG) BY MOUTH DAILY BEFORE BREAKFAST, Disp: 90 capsule, Rfl: 1   meloxicam (MOBIC) 15 MG tablet, Take 1 tablet (15 mg total) by mouth daily., Disp: 30 tablet, Rfl: 1   pantoprazole (PROTONIX) 40 MG tablet, Take 1 tablet (40 mg total) by mouth daily. Take 30-60 minutes before eating, Disp: 90 tablet, Rfl: 5   temazepam (RESTORIL) 15 MG capsule, Take 1 capsule (15 mg total) by mouth 2 (two) times daily as needed for sleep (anxiety)., Disp: 60 capsule, Rfl: 4   tiZANidine (ZANAFLEX) 2 MG tablet, Take 1 tablet (2 mg total) by mouth every 8 (eight) hours as needed for muscle spasms., Disp: 90 tablet, Rfl: 6   traMADol (ULTRAM) 50 MG tablet, Take 1 tablet (50 mg total) by mouth every 12 (twelve) hours  as needed., Disp: 60 tablet, Rfl: 5   Objective:     Vitals:   07/02/22 0945  BP: 138/89  Weight: 204 lb (92.5 kg)  Height: 5\' 6"  (1.676 m)      Body mass index is 32.93 kg/m.    Physical Exam:    General:  awake, alert oriented, no acute distress nontoxic Skin: no suspicious lesions or rashes Neuro:sensation intact and strength 5/5 with no deficits, no atrophy, normal muscle tone Psych: No signs of anxiety, depression or other mood disorder   Bilateral knee: Crepitus  bilaterally No swelling No deformity Neg fluid wave, joint milking ROM Flex 100, Ext 5 TTP mildly medial and lateral joint line NTTP over the patella, quad tendon, medial fem condyle, lat fem condyle,  , plica, patella tendon, tibial tuberostiy, fibular head, posterior fossa, pes anserine bursa, gerdy's tubercle,     Gait normal     Electronically signed by:  Angela Ramirez [facility] 10:25 AM 07/02/22

## 2022-07-02 ENCOUNTER — Ambulatory Visit: Payer: PPO | Admitting: Sports Medicine

## 2022-07-02 VITALS — BP 138/89 | Ht 66.0 in | Wt 204.0 lb

## 2022-07-02 DIAGNOSIS — M170 Bilateral primary osteoarthritis of knee: Secondary | ICD-10-CM

## 2022-07-02 DIAGNOSIS — M25562 Pain in left knee: Secondary | ICD-10-CM | POA: Diagnosis not present

## 2022-07-02 DIAGNOSIS — G8929 Other chronic pain: Secondary | ICD-10-CM

## 2022-07-02 DIAGNOSIS — M25561 Pain in right knee: Secondary | ICD-10-CM

## 2022-07-02 NOTE — Patient Instructions (Signed)
Aquatic therapy  Can use tumeric  Can continue HEP for knees  Can use Voltaren gel as needed  May start stationary bike or elliptical exercise  As needed follow up

## 2022-07-23 ENCOUNTER — Ambulatory Visit: Payer: PPO

## 2022-08-02 ENCOUNTER — Ambulatory Visit (INDEPENDENT_AMBULATORY_CARE_PROVIDER_SITE_OTHER): Payer: PPO | Admitting: Internal Medicine

## 2022-08-02 ENCOUNTER — Encounter: Payer: Self-pay | Admitting: Internal Medicine

## 2022-08-02 VITALS — BP 170/100 | HR 86 | Temp 97.9°F | Ht 66.0 in | Wt 200.0 lb

## 2022-08-02 DIAGNOSIS — R0683 Snoring: Secondary | ICD-10-CM | POA: Diagnosis not present

## 2022-08-02 DIAGNOSIS — M25561 Pain in right knee: Secondary | ICD-10-CM | POA: Diagnosis not present

## 2022-08-02 DIAGNOSIS — M25562 Pain in left knee: Secondary | ICD-10-CM

## 2022-08-02 DIAGNOSIS — G8929 Other chronic pain: Secondary | ICD-10-CM

## 2022-08-02 NOTE — Assessment & Plan Note (Signed)
She will continue to increase activity gradually and they are improving.

## 2022-08-02 NOTE — Progress Notes (Signed)
   Subjective:   Patient ID: Angela Ramirez, female    DOB: 13-Sep-1945, 77 y.o.   MRN: [id number]  HPI The patient is a 77 YO female coming in for pain in knees. Seen at sports med and they referred for PT. Wants to be checked. Also more snoring wants checked for sleep apnea  Review of Systems  Constitutional: Negative.   HENT: Negative.    Eyes: Negative.   Respiratory:  Negative for cough, chest tightness and shortness of breath.        Snoring  Cardiovascular:  Negative for chest pain, palpitations and leg swelling.  Gastrointestinal:  Negative for abdominal distention, abdominal pain, constipation, diarrhea, nausea and vomiting.  Musculoskeletal: Negative.   Skin: Negative.   Neurological: Negative.   Psychiatric/Behavioral: Negative.      Objective:  Physical Exam Constitutional:      Appearance: She is well-developed.  HENT:     Head: Normocephalic and atraumatic.  Cardiovascular:     Rate and Rhythm: Normal rate and regular rhythm.  Pulmonary:     Effort: Pulmonary effort is normal. No respiratory distress.     Breath sounds: Normal breath sounds. No wheezing or rales.  Abdominal:     General: Bowel sounds are normal. There is no distension.     Palpations: Abdomen is soft.     Tenderness: There is no abdominal tenderness. There is no rebound.  Musculoskeletal:        General: Tenderness present.     Cervical back: Normal range of motion.     Comments: Mild tenderness posterior right knee, MCL/LCL intact and non-tender right and left knee  Skin:    General: Skin is warm and dry.  Neurological:     Mental Status: She is alert and oriented to person, place, and time.     Coordination: Coordination normal.     Vitals:   08/02/22 0825 08/02/22 0831  BP: (!) 170/100 (!) 170/100  Pulse: 86   Temp: 97.9 F (36.6 C)   TempSrc: Oral   SpO2: 96%   Weight: 200 lb (90.7 kg)   Height: 5\' 6"  (1.676 m)     Assessment & Plan:  Visit time 15 minutes in face to face  communication with patient and coordination of care, additional 5 minutes spent in record review, coordination or care, ordering tests, communicating/referring to other healthcare professionals, documenting in medical records all on the same day of the visit for total time 20 minutes spent on the visit.

## 2022-08-12 ENCOUNTER — Encounter: Payer: Self-pay | Admitting: Family Medicine

## 2022-08-12 ENCOUNTER — Other Ambulatory Visit: Payer: Self-pay

## 2022-08-12 ENCOUNTER — Ambulatory Visit (INDEPENDENT_AMBULATORY_CARE_PROVIDER_SITE_OTHER): Payer: PPO | Admitting: Family Medicine

## 2022-08-12 VITALS — BP 148/72 | HR 90 | Temp 98.3°F | Resp 22 | Ht 66.0 in | Wt 199.0 lb

## 2022-08-12 DIAGNOSIS — U071 COVID-19: Secondary | ICD-10-CM | POA: Diagnosis not present

## 2022-08-12 MED ORDER — NIRMATRELVIR/RITONAVIR (PAXLOVID)TABLET: 3.0000 | ORAL_TABLET | Freq: Two times a day (BID) | ORAL | 0 refills | Status: AC

## 2022-08-12 MED ORDER — ONDANSETRON 4 MG PO TBDP: 4.0000 mg | ORAL_TABLET | Freq: Three times a day (TID) | ORAL | 0 refills | Status: DC | PRN

## 2022-08-12 MED ORDER — DEXAMETHASONE 6 MG PO TABS: 6.0000 mg | ORAL_TABLET | Freq: Every day | ORAL | 0 refills | Status: DC

## 2022-08-12 MED ORDER — LEVALBUTEROL TARTRATE 45 MCG/ACT IN AERO: 1.0000 | INHALATION_SPRAY | Freq: Four times a day (QID) | RESPIRATORY_TRACT | 0 refills | Status: AC | PRN

## 2022-08-12 MED ORDER — LEVALBUTEROL TARTRATE 45 MCG/ACT IN AERO: 1.0000 | INHALATION_SPRAY | Freq: Four times a day (QID) | RESPIRATORY_TRACT | 0 refills | Status: DC | PRN

## 2022-08-12 MED ORDER — NIRMATRELVIR/RITONAVIR (PAXLOVID)TABLET: 3.0000 | ORAL_TABLET | Freq: Two times a day (BID) | ORAL | 0 refills | Status: DC

## 2022-08-12 MED ORDER — DEXAMETHASONE 6 MG PO TABS: 6.0000 mg | ORAL_TABLET | Freq: Every day | ORAL | 0 refills | Status: AC

## 2022-08-12 NOTE — Progress Notes (Signed)
Assessment & Plan:  1. COVID-19 Education provided on COVID-19.  Discussed signs and symptoms that should prompt her to go to the ER.  Advised a 5-day quarantine and wearing a mask up to 14 days when going out in public.  Recommended symptom management including delsym, Zofran, Xopenex, Tylenol, and adequate hydration. Medication reactions checked with the [facility] COVID-19 drug interactions: patient was advised take half dose of amlodipine while taking Paxlovid. - levalbuterol (XOPENEX HFA) 45 MCG/ACT inhaler; Inhale 1-2 puffs into the lungs every 6 (six) hours as needed for wheezing or shortness of breath.  Dispense: 15 g; Refill: 0 - nirmatrelvir/ritonavir (PAXLOVID) 20 x 150 MG & 10 x 100MG TABS; Take 3 tablets by mouth 2 (two) times daily for 5 days. (Take nirmatrelvir 150 mg two tablets twice daily for 5 days and ritonavir 100 mg one tablet twice daily for 5 days) Patient GFR is 86  Dispense: 30 tablet; Refill: 0 - ondansetron (ZOFRAN-ODT) 4 MG disintegrating tablet; Take 1 tablet (4 mg total) by mouth every 8 (eight) hours as needed for nausea or vomiting.  Dispense: 20 tablet; Refill: 0 - dexamethasone (DECADRON) 6 MG tablet; Take 1 tablet (6 mg total) by mouth daily for 5 days.  Dispense: 5 tablet; Refill: 0   Follow up plan: Return if symptoms worsen or fail to improve.  Hendricks Limes, MSN, APRN, FNP-C  Subjective:  HPI: Angela Ramirez is a 77 y.o. female presenting on 08/12/2022 for Covid Positive (At home test today - fever, chills, cough, nausea, HA, fatigue.)  Patient is accompanied by her daughter, who she is okay with being present. She complains of cough, headache, sore throat, fever, wheezing, nausea, chills/sweats, decreased appetite, and fatigue. She denies head/chest congestion, runny nose, sneezing, shortness of breath, abdominal pain, and diarrhea. Onset of symptoms was 4 days ago, gradually worsening since that time. She is drinking plenty of fluids.  Evaluation to date: at home COVID test positive this morning. Treatment to date:  Delsym, Elderberry, and Zinc . She does have a history of COPD. She does not smoke.    ROS: Negative unless specifically indicated above in HPI.   Relevant past medical history reviewed and updated as indicated.   Allergies and medications reviewed and updated.   Current Outpatient Medications:    Acetaminophen-Caffeine 500-65 MG TABS, Take 1 tablet by mouth every 6 (six) hours as needed (headache). Tension relief, Disp: , Rfl:    amLODipine (NORVASC) 10 MG tablet, Take 1 tablet (10 mg total) by mouth daily., Disp: 90 tablet, Rfl: 3   hydrochlorothiazide (HYDRODIURIL) 25 MG tablet, Take 1 tablet (25 mg total) by mouth daily., Disp: 90 tablet, Rfl: 3   pantoprazole (PROTONIX) 40 MG tablet, Take 1 tablet (40 mg total) by mouth daily. Take 30-60 minutes before eating, Disp: 90 tablet, Rfl: 5   temazepam (RESTORIL) 15 MG capsule, Take 1 capsule (15 mg total) by mouth 2 (two) times daily as needed for sleep (anxiety)., Disp: 60 capsule, Rfl: 4   tiZANidine (ZANAFLEX) 2 MG tablet, Take 1 tablet (2 mg total) by mouth every 8 (eight) hours as needed for muscle spasms., Disp: 90 tablet, Rfl: 6   traMADol (ULTRAM) 50 MG tablet, Take 1 tablet (50 mg total) by mouth every 12 (twelve) hours as needed., Disp: 60 tablet, Rfl: 5   levalbuterol (XOPENEX HFA) 45 MCG/ACT inhaler, Inhale 1-2 puffs into the lungs every 4 (four) hours as needed for wheezing. (Patient not taking: Reported on 08/12/2022), Disp:  1 each, Rfl: 0  Allergies  Allergen Reactions   Crab (Diagnostic) Hives   Morphine And Related Nausea And Vomiting   Sulfa Antibiotics Nausea And Vomiting    Objective:   BP (!) 148/72 Comment: manual  Pulse 90   Temp 98.3 F (36.8 C)   Resp (!) 22   Ht 5' 6"$  (1.676 m)   Wt 199 lb (90.3 kg)   BMI 32.12 kg/m    Physical Exam Vitals reviewed.  Constitutional:      General: She is not in acute distress.     Appearance: Normal appearance. She is not ill-appearing, toxic-appearing or diaphoretic.  HENT:     Head: Normocephalic and atraumatic.     Right Ear: Tympanic membrane, ear canal and external ear normal. There is no impacted cerumen.     Left Ear: Tympanic membrane, ear canal and external ear normal. There is no impacted cerumen.     Nose: Congestion present. No rhinorrhea.     Right Sinus: No maxillary sinus tenderness or frontal sinus tenderness.     Left Sinus: No maxillary sinus tenderness or frontal sinus tenderness.     Mouth/Throat:     Mouth: Mucous membranes are moist.     Pharynx: Oropharynx is clear. Posterior oropharyngeal erythema present. No oropharyngeal exudate.  Eyes:     General: No scleral icterus.       Right eye: No discharge.        Left eye: No discharge.     Conjunctiva/sclera: Conjunctivae normal.  Cardiovascular:     Rate and Rhythm: Normal rate and regular rhythm.     Heart sounds: Normal heart sounds. No murmur heard.    No friction rub. No gallop.  Pulmonary:     Effort: Pulmonary effort is normal. No respiratory distress.     Breath sounds: Normal breath sounds. No stridor. No wheezing, rhonchi or rales.  Musculoskeletal:        General: Normal range of motion.     Cervical back: Normal range of motion.  Lymphadenopathy:     Cervical: No cervical adenopathy.  Skin:    General: Skin is warm and dry.     Capillary Refill: Capillary refill takes less than 2 seconds.  Neurological:     General: No focal deficit present.     Mental Status: She is alert and oriented to person, place, and time. Mental status is at baseline.  Psychiatric:        Mood and Affect: Mood normal.        Behavior: Behavior normal.        Thought Content: Thought content normal.        Judgment: Judgment normal.

## 2022-08-12 NOTE — Patient Instructions (Signed)
Break your amlodipine in half while taking Paxlovid.

## 2022-08-15 ENCOUNTER — Telehealth: Payer: Self-pay | Admitting: Internal Medicine

## 2022-08-15 NOTE — Telephone Encounter (Signed)
Patient called and said she was told she would have Promethazine prescribed for nausea, but  ondansetron (ZOFRAN-ODT) 4 MG disintegrating tablet was sent in instead. Her insurance won't cover the Zofran and she would like the promethazine to be sent in to [facility] [id number] - [place], [place] callback number is [phone].

## 2022-08-16 MED ORDER — ONDANSETRON HCL 4 MG PO TABS: 4.0000 mg | ORAL_TABLET | Freq: Three times a day (TID) | ORAL | 0 refills | Status: DC | PRN

## 2022-08-16 NOTE — Telephone Encounter (Signed)
I have sent in regular zofran tabs which should be covered. Zofran is safer for her than promethazine so I recommend to try this first. I apologize that the provider she saw did not give her this information.

## 2022-08-20 ENCOUNTER — Telehealth: Payer: Self-pay | Admitting: Internal Medicine

## 2022-08-20 NOTE — Telephone Encounter (Signed)
Patient said she has "Covid mouth". Her mouth is in pain, it's a burning pain. She also gets nausea and heartburn when she eats. She would like a call about what she can do to help it. If medication needs to be sent in, she would like it sent to [facility] [id number] - [place], [place] callback number is [phone].

## 2022-08-21 MED ORDER — LIDOCAINE VISCOUS HCL 2 % MT SOLN: 15.0000 mL | Freq: Four times a day (QID) | OROMUCOSAL | 0 refills | Status: DC | PRN

## 2022-08-21 NOTE — Telephone Encounter (Signed)
Ok this is done to [facility]

## 2022-08-21 NOTE — Telephone Encounter (Signed)
If this is related to covid-19 infection there is not a medication which can help. If she wants we can send in liquid lidocaine to [facility] to help with mouth pain

## 2022-08-22 ENCOUNTER — Telehealth: Payer: Self-pay

## 2022-08-22 NOTE — Telephone Encounter (Signed)
Contacted Angela Ramirez to schedule their annual wellness visit. Patient declined to schedule AWV at this time.  Norton Blizzard, [place] ([facility])  [facility] [phone]

## 2022-08-30 ENCOUNTER — Ambulatory Visit (HOSPITAL_BASED_OUTPATIENT_CLINIC_OR_DEPARTMENT_OTHER): Payer: PPO | Admitting: Physical Therapy

## 2022-08-31 ENCOUNTER — Other Ambulatory Visit: Payer: Self-pay | Admitting: Internal Medicine
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
## 2022-10-25 ENCOUNTER — Ambulatory Visit (INDEPENDENT_AMBULATORY_CARE_PROVIDER_SITE_OTHER): Payer: PPO | Admitting: Ophthalmology

## 2022-10-25 ENCOUNTER — Encounter (INDEPENDENT_AMBULATORY_CARE_PROVIDER_SITE_OTHER): Payer: Self-pay | Admitting: Ophthalmology

## 2022-10-25 VITALS — BP 175/91 | HR 75

## 2022-10-25 DIAGNOSIS — H5213 Myopia, bilateral: Secondary | ICD-10-CM

## 2022-10-25 DIAGNOSIS — H442E3 Degenerative myopia with other maculopathy, bilateral eye: Secondary | ICD-10-CM | POA: Diagnosis not present

## 2022-10-25 DIAGNOSIS — H532 Diplopia: Secondary | ICD-10-CM | POA: Diagnosis not present

## 2022-10-25 DIAGNOSIS — H35033 Hypertensive retinopathy, bilateral: Secondary | ICD-10-CM | POA: Diagnosis not present

## 2022-10-25 DIAGNOSIS — H353221 Exudative age-related macular degeneration, left eye, with active choroidal neovascularization: Secondary | ICD-10-CM | POA: Diagnosis not present

## 2022-10-25 DIAGNOSIS — H43813 Vitreous degeneration, bilateral: Secondary | ICD-10-CM | POA: Diagnosis not present

## 2022-10-25 DIAGNOSIS — I10 Essential (primary) hypertension: Secondary | ICD-10-CM | POA: Diagnosis not present

## 2022-10-25 DIAGNOSIS — Z961 Presence of intraocular lens: Secondary | ICD-10-CM

## 2022-10-25 DIAGNOSIS — H4312 Vitreous hemorrhage, left eye: Secondary | ICD-10-CM

## 2022-10-25 MED ORDER — AFLIBERCEPT 2MG/0.05ML IZ SOLN FOR KALEIDOSCOPE
2.0000 mg | INTRAVITREAL | Status: AC | PRN
  Administered 2022-10-25: 2 mg via INTRAVITREAL
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
## 2022-11-08 ENCOUNTER — Encounter (INDEPENDENT_AMBULATORY_CARE_PROVIDER_SITE_OTHER): Payer: Self-pay | Admitting: Ophthalmology

## 2022-11-08 ENCOUNTER — Ambulatory Visit (INDEPENDENT_AMBULATORY_CARE_PROVIDER_SITE_OTHER): Payer: PPO | Admitting: Ophthalmology

## 2022-11-08 DIAGNOSIS — H35033 Hypertensive retinopathy, bilateral: Secondary | ICD-10-CM

## 2022-11-08 DIAGNOSIS — H43813 Vitreous degeneration, bilateral: Secondary | ICD-10-CM | POA: Diagnosis not present

## 2022-11-08 DIAGNOSIS — H4312 Vitreous hemorrhage, left eye: Secondary | ICD-10-CM

## 2022-11-08 DIAGNOSIS — I10 Essential (primary) hypertension: Secondary | ICD-10-CM

## 2022-11-08 DIAGNOSIS — H532 Diplopia: Secondary | ICD-10-CM

## 2022-11-08 DIAGNOSIS — Z961 Presence of intraocular lens: Secondary | ICD-10-CM

## 2022-11-08 DIAGNOSIS — H442E3 Degenerative myopia with other maculopathy, bilateral eye: Secondary | ICD-10-CM | POA: Diagnosis not present

## 2022-11-08 DIAGNOSIS — H5213 Myopia, bilateral: Secondary | ICD-10-CM

## 2022-11-08 DIAGNOSIS — H353221 Exudative age-related macular degeneration, left eye, with active choroidal neovascularization: Secondary | ICD-10-CM | POA: Diagnosis not present

## 2022-11-10 ENCOUNTER — Encounter (INDEPENDENT_AMBULATORY_CARE_PROVIDER_SITE_OTHER): Payer: Self-pay | Admitting: Ophthalmology
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
## 2022-11-22 ENCOUNTER — Ambulatory Visit (INDEPENDENT_AMBULATORY_CARE_PROVIDER_SITE_OTHER): Payer: PPO | Admitting: Ophthalmology

## 2022-11-22 ENCOUNTER — Encounter (INDEPENDENT_AMBULATORY_CARE_PROVIDER_SITE_OTHER): Payer: Self-pay | Admitting: Ophthalmology

## 2022-11-22 DIAGNOSIS — H442E3 Degenerative myopia with other maculopathy, bilateral eye: Secondary | ICD-10-CM

## 2022-11-22 DIAGNOSIS — H35033 Hypertensive retinopathy, bilateral: Secondary | ICD-10-CM

## 2022-11-22 DIAGNOSIS — Z961 Presence of intraocular lens: Secondary | ICD-10-CM

## 2022-11-22 DIAGNOSIS — I10 Essential (primary) hypertension: Secondary | ICD-10-CM | POA: Diagnosis not present

## 2022-11-22 DIAGNOSIS — H353221 Exudative age-related macular degeneration, left eye, with active choroidal neovascularization: Secondary | ICD-10-CM | POA: Diagnosis not present

## 2022-11-22 DIAGNOSIS — H4312 Vitreous hemorrhage, left eye: Secondary | ICD-10-CM | POA: Diagnosis not present

## 2022-11-22 DIAGNOSIS — H532 Diplopia: Secondary | ICD-10-CM | POA: Diagnosis not present

## 2022-11-22 DIAGNOSIS — H5213 Myopia, bilateral: Secondary | ICD-10-CM

## 2022-11-22 DIAGNOSIS — H43813 Vitreous degeneration, bilateral: Secondary | ICD-10-CM

## 2022-11-22 MED ORDER — AFLIBERCEPT 2MG/0.05ML IZ SOLN FOR KALEIDOSCOPE
2.0000 mg | INTRAVITREAL | Status: AC | PRN
  Administered 2022-11-22: 2 mg via INTRAVITREAL

## 2022-11-23 ENCOUNTER — Ambulatory Visit (HOSPITAL_BASED_OUTPATIENT_CLINIC_OR_DEPARTMENT_OTHER): Payer: PPO | Attending: Sports Medicine | Admitting: Physical Therapy

## 2022-11-23 DIAGNOSIS — G8929 Other chronic pain: Secondary | ICD-10-CM | POA: Diagnosis not present

## 2022-11-23 DIAGNOSIS — M25561 Pain in right knee: Secondary | ICD-10-CM | POA: Insufficient documentation

## 2022-11-23 DIAGNOSIS — M170 Bilateral primary osteoarthritis of knee: Secondary | ICD-10-CM | POA: Insufficient documentation

## 2022-11-23 DIAGNOSIS — M6281 Muscle weakness (generalized): Secondary | ICD-10-CM | POA: Insufficient documentation

## 2022-11-23 DIAGNOSIS — M25562 Pain in left knee: Secondary | ICD-10-CM | POA: Insufficient documentation

## 2022-11-23 NOTE — Therapy (Signed)
OUTPATIENT PHYSICAL THERAPY LOWER EXTREMITY EVALUATION   Patient Name: Angela Ramirez MRN: [id number] DOB:September 20, 1945, 77 y.o., female Today's Date: 11/24/2022  END OF SESSION:  PT End of Session - 11/23/22 1651     Visit Number 1    Number of Visits 6    Date for PT Re-Evaluation 01/10/23    Authorization Type HTA mcr    PT Start Time 1205    PT Stop Time 1240    PT Time Calculation (min) 35 min    Activity Tolerance Patient tolerated treatment well    Behavior During Therapy WFL for tasks assessed/performed             Past Medical History:  Diagnosis Date   Anxiety    Aortic atherosclerosis (HCC)    Arnold-Chiari malformation (HCC)    Arthritis    Cirrhosis (HCC)    Colon polyps    Depression    Diverticulitis    Diverticulosis    Emphysema of lung (HCC) 12/02/2020   per CT scan   Headache    Hepatitis C    Hiatal hernia    Hypertension    Hypertensive retinopathy    IBS (irritable bowel syndrome)    Internal hemorrhoids    Sepsis (HCC) 11/2020   UTI (urinary tract infection)    Past Surgical History:  Procedure Laterality Date   BRAIN SURGERY  2003   decompression   BREAST EXCISIONAL BIOPSY Bilateral    age 35's   CATARACT EXTRACTION     DILATION AND CURETTAGE OF UTERUS     EYE SURGERY     OVARIAN CYST REMOVAL Bilateral    REDUCTION MAMMAPLASTY Bilateral    25+ years ago   Patient Active Problem List   Diagnosis Date Noted   Chronic pain of both knees 05/13/2022   SOBOE (shortness of breath on exertion) 04/11/2021   Weakness acquired in ICU 01/26/2021   Painful urination 11/10/2020   Left foot pain 07/14/2020   Situational anxiety 07/14/2020   Allergic rhinitis 02/11/2020   Dysfunction of Eustachian tube, bilateral 02/11/2020   Vertigo 02/11/2020   Constipation 08/20/2019   GERD (gastroesophageal reflux disease) 02/19/2019   Finger pain, right 02/20/2018   Low back pain 02/20/2018   Liver fibrosis 10/09/2017   Hep C w/o coma, chronic  (HCC) 08/26/2017   Routine general medical examination at a health care facility 08/18/2017   Right shoulder pain 08/18/2017   Essential hypertension 09/21/2015   Arnold-Chiari malformation (HCC) 09/21/2015   Insomnia 09/21/2015   PTSD (post-traumatic stress disorder) 09/21/2015    PCP:   Myrlene Broker, MD    REFERRING PROVIDER: Richardean Sale, DO   REFERRING DIAG:  M25.561,M25.562,G89.29 (ICD-10-CM) - Chronic pain of both knees  M17.0 (ICD-10-CM) - Bilateral primary osteoarthritis of knee    THERAPY DIAG:  Chronic pain of both knees  Muscle weakness (generalized)  Rationale for Evaluation and Treatment: Rehabilitation  ONSET DATE: ~1 yr  SUBJECTIVE:   SUBJECTIVE STATEMENT: Has sepsis last year caused leg pain.  My body has not been the same.  R knee pain >Left .  Went to PT and did 5 x sts increase right knee pain  and back of knee. Didn't go back L foot/ankle surgery and area is weak. Now back at gym 3 x week Zumba, line dancing. Want to do do the water Zumba at [employer].  Very active go all day hurt at night.  PERTINENT HISTORY: 1. Chronic pain of both knees 2. Bilateral  primary osteoarthritis of knee -Chronic with exacerbation, subsequent visit - Overall decrease in flare of osteoarthritis with patient using Voltaren gel as needed, HEP - Patient tried physical therapy and, however land-based PT significantly flared symptoms.  Patient request referral for aquatic therapy which she has benefited from in the past.  Referral sent today - May continue Voltaren gel topically as needed - Reviewed that long-term Tylenol use is not ideal with history of chronic hep C, long-term NSAID use is not ideal with history of GI pathology, IBS, GERD and episode of diverticulitis leading to sepsis.  Patient not currently interested in CSI due to history of weight gain with CSI, though could be considered at future office visit - May use turmeric and diet for general  anti-inflammatory properties - Ambulatory referral to Physical Therapy  -Recommend stationary bike or elliptical or water therapy for activity PAIN:  Are you having pain? Yes: NPRS scale: current 0/10, worst 12/31/08 Pain location: knees R>L Pain description: ache Aggravating factors: grocery shopping/standing 6 hours Relieving factors: Aleve, tramdol, heat  PRECAUTIONS: Knee  WEIGHT BEARING RESTRICTIONS: No  FALLS:  Has patient fallen in last 6 months? No  LIVING ENVIRONMENT: Lives with: lives with their family   OCCUPATION: retired  PLOF: Independent  PATIENT GOALS: Get my mobility back, get back to gym, doing zumba    OBJECTIVE:   DIAGNOSTIC FINDINGS: none noted  PATIENT SURVEYS:  FOTO 43% with goal of 57%  COGNITION: Overall cognitive status: Within functional limits for tasks assessed     MUSCLE LENGTH: Hamstrings: Right 80 deg; Left 80 deg   POSTURE:  slight lateral  PALPATION: Mild patellar crepitus bilat   LOWER EXTREMITY ROM:  Active ROM Right eval Left eval  Hip flexion    Hip extension    Hip abduction    Hip adduction    Hip internal rotation    Hip external rotation    Knee flexion 125 130  Knee extension 0 0  Ankle dorsiflexion    Ankle plantarflexion    Ankle inversion    Ankle eversion     (Blank rows = not tested)  LOWER EXTREMITY MMT:  MMT Right eval Left eval  Hip flexion 59.2 59.4  Hip extension    Hip abduction    Hip adduction 38.3 38.1  Hip internal rotation    Hip external rotation    Knee flexion 54.1 50.1  Knee extension    Ankle dorsiflexion    Ankle plantarflexion    Ankle inversion    Ankle eversion     (Blank rows = not tested)   FUNCTIONAL TESTS:  Timed up and go (TUG): 10.42 Berg Balance Scale: 45/56  GAIT: Distance walked: 500 ft Assistive device utilized: None Level of assistance: Complete Independence Comments: shortened step length, quick pace   TODAY'S TREATMENT:  Eval Foto Objective testing   PATIENT EDUCATION:  Education details: Discussed eval findings, rehab rationale, aquatic program progression/POC and pools in area. Patient is in agreement  Person educated: Patient Education method: Explanation Education comprehension: verbalized understanding  HOME EXERCISE PROGRAM: TBA  ASSESSMENT:  CLINICAL IMPRESSION: Patient is a  y.o. 89  who was seen today for physical therapy evaluation and treatment for Knee pain/Oa knee. She is a very active senior participating at [employer] and taking Zumba classes. She stays busy during days with exercise and shopping on her feet continuously for 6hours +.She presents with chronic knee pain due to OA which she feels is limiting her function.  She has had an episode of sepsis then covid in past year or so which has caused some lingering weakness and lack of energy.  She is interested in learning exercises she can do in pool to enhance her activities on land to return her to her PLOF. She does have pool access. She is a good candidate for aquatic physical therapy using the properties of water to improve core and LE strengthening and balance enhancing exercises she completes indep land based.  OBJECTIVE IMPAIRMENTS: pain.   ACTIVITY LIMITATIONS: carrying, lifting, and squatting  PARTICIPATION LIMITATIONS: community activity  PERSONAL FACTORS: Fitness and Past/current experiences are also affecting patient's functional outcome.   REHAB POTENTIAL: Good  CLINICAL DECISION MAKING: Evolving/moderate complexity  EVALUATION COMPLEXITY: Moderate   GOALS: Goals reviewed with patient? Yes  SHORT TERM GOALS: Target date: 01/10/23 Pt will tolerate full aquatic sessions consistently without increase in pain and with improving function to demonstrate good toleration and effectiveness of intervention.    Baseline: Goal status: INITIAL  2. Pt to improve Foto score to 53 Baseline: 43 Goal status: INITIAL  3.  Pt will be indep with final aquatic HEP for continued management of condition Baseline: none Goal status: INITIAL  4.  Pt will improve on Berg balance test to >/= 52/56 to demonstrate a decrease in fall risk. Baseline: 46/56 Goal status: INITIAL  5.  Pt will improve strength in hip abd by at least 5 lbs to demonstrate improved overall physical function Baseline: see chart Goal status: INITIAL    LONG TERM GOALS: Not approp at this time. Will set when/if necessary   PLAN:  PT FREQUENCY: 1-2 week  PT DURATION: 6 weeks 6 visits (will likely complete within 4 visits).  PLANNED INTERVENTIONS: Therapeutic exercises, Therapeutic activity, Neuromuscular re-education, Balance training, Gait training, Patient/Family education, Self Care, Joint mobilization, Joint manipulation, Stair training, Orthotic/Fit training, DME instructions, Aquatic Therapy, Dry Needling, Electrical stimulation, Cryotherapy, Moist heat, Taping, Ionotophoresis 4mg /ml Dexamethasone, Manual therapy, and Re-evaluation  PLAN FOR NEXT SESSION: Begin instruction on Aquatic HEP for strengthening and stretching LE, core strengthening exercises; pain reduction OA knees.     Tennille Montelongo Tomma Lightning) Veasna Santibanez MPT 11/24/2022, 5:01 PM

## 2022-11-24 ENCOUNTER — Other Ambulatory Visit: Payer: Self-pay

## 2022-11-24 ENCOUNTER — Encounter (HOSPITAL_BASED_OUTPATIENT_CLINIC_OR_DEPARTMENT_OTHER): Payer: Self-pay | Admitting: Physical Therapy

## 2022-11-29 ENCOUNTER — Other Ambulatory Visit: Payer: Self-pay | Admitting: Internal Medicine
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
## 2022-12-20 ENCOUNTER — Encounter (INDEPENDENT_AMBULATORY_CARE_PROVIDER_SITE_OTHER): Payer: Self-pay | Admitting: Ophthalmology

## 2022-12-20 ENCOUNTER — Ambulatory Visit (INDEPENDENT_AMBULATORY_CARE_PROVIDER_SITE_OTHER): Payer: PPO | Admitting: Ophthalmology

## 2022-12-20 DIAGNOSIS — H35033 Hypertensive retinopathy, bilateral: Secondary | ICD-10-CM

## 2022-12-20 DIAGNOSIS — H353221 Exudative age-related macular degeneration, left eye, with active choroidal neovascularization: Secondary | ICD-10-CM | POA: Diagnosis not present

## 2022-12-20 DIAGNOSIS — I10 Essential (primary) hypertension: Secondary | ICD-10-CM | POA: Diagnosis not present

## 2022-12-20 DIAGNOSIS — Z961 Presence of intraocular lens: Secondary | ICD-10-CM

## 2022-12-20 DIAGNOSIS — H442E3 Degenerative myopia with other maculopathy, bilateral eye: Secondary | ICD-10-CM | POA: Diagnosis not present

## 2022-12-20 DIAGNOSIS — H43813 Vitreous degeneration, bilateral: Secondary | ICD-10-CM | POA: Diagnosis not present

## 2022-12-20 DIAGNOSIS — H5213 Myopia, bilateral: Secondary | ICD-10-CM

## 2022-12-20 DIAGNOSIS — H4312 Vitreous hemorrhage, left eye: Secondary | ICD-10-CM

## 2022-12-20 MED ORDER — AFLIBERCEPT 2MG/0.05ML IZ SOLN FOR KALEIDOSCOPE
2.0000 mg | INTRAVITREAL | Status: AC | PRN
  Administered 2022-12-20: 2 mg via INTRAVITREAL

## 2022-12-27 ENCOUNTER — Ambulatory Visit (HOSPITAL_BASED_OUTPATIENT_CLINIC_OR_DEPARTMENT_OTHER): Payer: PPO | Attending: Sports Medicine | Admitting: Physical Therapy

## 2022-12-27 ENCOUNTER — Encounter (HOSPITAL_BASED_OUTPATIENT_CLINIC_OR_DEPARTMENT_OTHER): Payer: Self-pay | Admitting: Physical Therapy

## 2022-12-27 DIAGNOSIS — M25561 Pain in right knee: Secondary | ICD-10-CM | POA: Insufficient documentation

## 2022-12-27 DIAGNOSIS — M25562 Pain in left knee: Secondary | ICD-10-CM | POA: Insufficient documentation

## 2022-12-27 DIAGNOSIS — M6281 Muscle weakness (generalized): Secondary | ICD-10-CM | POA: Insufficient documentation

## 2022-12-27 DIAGNOSIS — G8929 Other chronic pain: Secondary | ICD-10-CM | POA: Insufficient documentation

## 2022-12-27 NOTE — Therapy (Signed)
OUTPATIENT PHYSICAL THERAPY LOWER EXTREMITY EVALUATION   Patient Name: PENINA SODER MRN: [id number] DOB:Feb 20, 1946, 77 y.o., female Today's Date: 12/27/2022  END OF SESSION:  PT End of Session - 12/27/22 0843     Visit Number 2    Number of Visits 6    Date for PT Re-Evaluation 01/10/23    Authorization Type HTA MCR    PT Start Time 0849    PT Stop Time 0930    PT Time Calculation (min) 41 min    Activity Tolerance Patient tolerated treatment well    Behavior During Therapy [facility] for tasks assessed/performed             Past Medical History:  Diagnosis Date   Anxiety    Aortic atherosclerosis (HCC)    Arnold-Chiari malformation (HCC)    Arthritis    Cirrhosis (HCC)    Colon polyps    Depression    Diverticulitis    Diverticulosis    Emphysema of lung (HCC) 12/02/2020   per CT scan   Headache    Hepatitis C    Hiatal hernia    Hypertension    Hypertensive retinopathy    IBS (irritable bowel syndrome)    Internal hemorrhoids    Sepsis (HCC) 11/2020   UTI (urinary tract infection)    Past Surgical History:  Procedure Laterality Date   BRAIN SURGERY  2003   decompression   BREAST EXCISIONAL BIOPSY Bilateral    age 43's   CATARACT EXTRACTION     DILATION AND CURETTAGE OF UTERUS     EYE SURGERY     OVARIAN CYST REMOVAL Bilateral    REDUCTION MAMMAPLASTY Bilateral    25+ years ago   Patient Active Problem List   Diagnosis Date Noted   Chronic pain of both knees 05/13/2022   SOBOE (shortness of breath on exertion) 04/11/2021   Weakness acquired in ICU 01/26/2021   Painful urination 11/10/2020   Left foot pain 07/14/2020   Situational anxiety 07/14/2020   Allergic rhinitis 02/11/2020   Dysfunction of Eustachian tube, bilateral 02/11/2020   Vertigo 02/11/2020   Constipation 08/20/2019   GERD (gastroesophageal reflux disease) 02/19/2019   Finger pain, right 02/20/2018   Low back pain 02/20/2018   Liver fibrosis 10/09/2017   Hep C w/o coma, chronic  (HCC) 08/26/2017   Routine general medical examination at a health care facility 08/18/2017   Right shoulder pain 08/18/2017   Essential hypertension 09/21/2015   Arnold-Chiari malformation (HCC) 09/21/2015   Insomnia 09/21/2015   PTSD (post-traumatic stress disorder) 09/21/2015    PCP:   Myrlene Broker, MD    REFERRING PROVIDER: Richardean Sale, DO   REFERRING DIAG:  M25.561,M25.562,G89.29 (ICD-10-CM) - Chronic pain of both knees  M17.0 (ICD-10-CM) - Bilateral primary osteoarthritis of knee    THERAPY DIAG:  Chronic pain of both knees  Muscle weakness (generalized)  Rationale for Evaluation and Treatment: Rehabilitation  ONSET DATE: ~1 yr  SUBJECTIVE:   SUBJECTIVE STATEMENT: Pt reports her knees continue to hurt.  She has been walking (on land, 65m-1hr, daily) for exercise, but this has been painful. She is planning to see Dr for possible injections next week.   PERTINENT HISTORY: 1. Chronic pain of both knees 2. Bilateral primary osteoarthritis of knee -Chronic with exacerbation, subsequent visit - Overall decrease in flare of osteoarthritis with patient using Voltaren gel as needed, HEP - Patient tried physical therapy and, however land-based PT significantly flared symptoms.  Patient request referral for aquatic therapy  which she has benefited from in the past.  Referral sent today - May continue Voltaren gel topically as needed - Reviewed that long-term Tylenol use is not ideal with history of chronic hep C, long-term NSAID use is not ideal with history of GI pathology, IBS, GERD and episode of diverticulitis leading to sepsis.  Patient not currently interested in CSI due to history of weight gain with CSI, though could be considered at future office visit - May use turmeric and diet for general anti-inflammatory properties - Ambulatory referral to Physical Therapy  -Recommend stationary bike or elliptical or water therapy for activity PAIN:  Are you having  pain?no: NPRS scale: current 0/10, worst 5-7/10  -- no pain at rest Pain location: knees R>L Pain description: ache Aggravating factors: grocery shopping/standing 6 hours Relieving factors: Aleve, tramdol, heat  PRECAUTIONS: Knee  WEIGHT BEARING RESTRICTIONS: No  FALLS:  Has patient fallen in last 6 months? No  LIVING ENVIRONMENT: Lives with: lives with their family   OCCUPATION: retired  PLOF: Independent  PATIENT GOALS: Get my mobility back, get back to gym, doing zumba    OBJECTIVE:   DIAGNOSTIC FINDINGS: none noted  PATIENT SURVEYS:  FOTO 43% with goal of 57%  COGNITION: Overall cognitive status: Within functional limits for tasks assessed     MUSCLE LENGTH: Hamstrings: Right 80 deg; Left 80 deg   POSTURE:  slight lateral  PALPATION: Mild patellar crepitus bilat   LOWER EXTREMITY ROM:  Active ROM Right eval Left eval  Hip flexion    Hip extension    Hip abduction    Hip adduction    Hip internal rotation    Hip external rotation    Knee flexion 125 130  Knee extension 0 0  Ankle dorsiflexion    Ankle plantarflexion    Ankle inversion    Ankle eversion     (Blank rows = not tested)  LOWER EXTREMITY MMT:  MMT Right eval Left eval  Hip flexion 59.2 59.4  Hip extension    Hip abduction    Hip adduction 38.3 38.1  Hip internal rotation    Hip external rotation    Knee flexion 54.1 50.1  Knee extension    Ankle dorsiflexion    Ankle plantarflexion    Ankle inversion    Ankle eversion     (Blank rows = not tested)   FUNCTIONAL TESTS:  Timed up and go (TUG): 10.42 Berg Balance Scale: 45/56  GAIT: Distance walked: 500 ft Assistive device utilized: None Level of assistance: Complete Independence Comments: shortened step length, quick pace   TODAY'S TREATMENT:                                                                                                                              Pt seen for aquatic therapy today.  Treatment  took place in water 3.5-4.75 ft in depth at the [facility] pool. Temp of water was 91.  Pt  entered/exited the pool via stairs independently with bilat rail. * intro to aquatic therapy principles * at 4 ft without support:  walking forward and backward with reciprocal arm swing; marching; side stepping with arm abdct/addct.  *side stepping with arm addct with rainbow hand floats * farmer carry- single/bilat rainbow hand floats under water at side for increased core engagement, marching forward/ backward * UE on rainbow hand floats: heel/toe raises x 10; LE swings x 3-> switched to single hand on wall * single hand on wall:  LE swings into hip flex/ext x 10 each LE, x 5 each LE;  into hip abdct/ addct x 10 each  * return to walking forward/ backward with arm swing *straddling yellow noodle:  cycling holding yellow hand floats (challenge) to holding corner;  hip abdct/addct and cross country ski  Pt requires the buoyancy and hydrostatic pressure of water for support, and to offload joints by unweighting joint load by at least 50 % in navel deep water and by at least 75-80% in chest to neck deep water.  Viscosity of the water is needed for resistance of strengthening. Water current perturbations provides challenge to standing balance requiring increased core activation.     PATIENT EDUCATION:  Education details: Discussed eval findings, rehab rationale, aquatic program progression/POC and pools in area. Patient is in agreement  Person educated: Patient Education method: Explanation Education comprehension: verbalized understanding  HOME EXERCISE PROGRAM: TBA  ASSESSMENT:  CLINICAL IMPRESSION: Pt is confident in aquatic environment and able to take direction from therapist on deck.  Encouraged pt to switch from walking 5x/wk on land to 2x/wk and moving 3 days to the pool, or also incorporate stationary bike with light resistance; pt agreeable to this.  She reported some relief in Rt  knee when facing hot tub (vs lap pool).  She complained of increased posterior Rt knee/lower hamstring with LE swings. She reported minor increase in knee pain to 3/10 after SLS with dynamic opp leg; reduced to 0 with suspended cycling.  She has difficulty with motor planning of LE for cycling; does better holding the wall. Plan to add 3 way LE stretch next visit.   Goals are ongoing.       From Eval:  Patient is a  y.o. 64  who was seen today for physical therapy evaluation and treatment for Knee pain/Oa knee. She is a very active senior participating at [employer] and taking Zumba classes. She stays busy during days with exercise and shopping on her feet continuously for 6hours +.She presents with chronic knee pain due to OA which she feels is limiting her function.  She has had an episode of sepsis then covid in past year or so which has caused some lingering weakness and lack of energy.  She is interested in learning exercises she can do in pool to enhance her activities on land to return her to her PLOF. She does have pool access. She is a good candidate for aquatic physical therapy using the properties of water to improve core and LE strengthening and balance enhancing exercises she completes indep land based.  OBJECTIVE IMPAIRMENTS: pain.   ACTIVITY LIMITATIONS: carrying, lifting, and squatting  PARTICIPATION LIMITATIONS: community activity  PERSONAL FACTORS: Fitness and Past/current experiences are also affecting patient's functional outcome.   REHAB POTENTIAL: Good  CLINICAL DECISION MAKING: Evolving/moderate complexity  EVALUATION COMPLEXITY: Moderate   GOALS: Goals reviewed with patient? Yes  SHORT TERM GOALS: Target date: 01/10/23 Pt will tolerate full aquatic  sessions consistently without increase in pain and with improving function to demonstrate good toleration and effectiveness of intervention.   Baseline: Goal status: INITIAL  2. Pt to improve Foto score to  53 Baseline: 43 Goal status: INITIAL  3.  Pt will be indep with final aquatic HEP for continued management of condition Baseline: none Goal status: INITIAL  4.  Pt will improve on Berg balance test to >/= 52/56 to demonstrate a decrease in fall risk. Baseline: 46/56 Goal status: INITIAL  5.  Pt will improve strength in hip abd by at least 5 lbs to demonstrate improved overall physical function Baseline: see chart Goal status: INITIAL    LONG TERM GOALS: Not approp at this time. Will set when/if necessary   PLAN:  PT FREQUENCY: 1-2 week  PT DURATION: 6 weeks 6 visits (will likely complete within 4 visits).  PLANNED INTERVENTIONS: Therapeutic exercises, Therapeutic activity, Neuromuscular re-education, Balance training, Gait training, Patient/Family education, Self Care, Joint mobilization, Joint manipulation, Stair training, Orthotic/Fit training, DME instructions, Aquatic Therapy, Dry Needling, Electrical stimulation, Cryotherapy, Moist heat, Taping, Ionotophoresis 4mg /ml Dexamethasone, Manual therapy, and Re-evaluation  PLAN FOR NEXT SESSION: Begin instruction on Aquatic HEP for strengthening and stretching LE, core strengthening exercises; pain reduction OA knees.    Mayer Camel, PTA 12/27/22 9:36 AM [facility] [address] [place], [place], [address] Phone: [phone]   Fax:  [phone]

## 2023-01-03 ENCOUNTER — Ambulatory Visit (HOSPITAL_BASED_OUTPATIENT_CLINIC_OR_DEPARTMENT_OTHER): Payer: PPO | Admitting: Physical Therapy

## 2023-01-03 DIAGNOSIS — M6281 Muscle weakness (generalized): Secondary | ICD-10-CM

## 2023-01-03 DIAGNOSIS — M25561 Pain in right knee: Secondary | ICD-10-CM | POA: Diagnosis not present

## 2023-01-03 DIAGNOSIS — G8929 Other chronic pain: Secondary | ICD-10-CM

## 2023-01-03 NOTE — Therapy (Signed)
OUTPATIENT PHYSICAL THERAPY LOWER EXTREMITY TREATMENT   Patient Name: Angela Ramirez MRN: [id number] DOB:[birth year], 77 y.o., female Today's Date: 01/03/2023  END OF SESSION:  PT End of Session - 01/03/23 0910     Visit Number 3    Number of Visits 6    Date for PT Re-Evaluation 01/10/23    Authorization Type HTA MCR    PT Start Time 0900    PT Stop Time 0940    PT Time Calculation (min) 40 min    Behavior During Therapy [facility] for tasks assessed/performed             Past Medical History:  Diagnosis Date   Anxiety    Aortic atherosclerosis (HCC)    Arnold-Chiari malformation (HCC)    Arthritis    Cirrhosis (HCC)    Colon polyps    Depression    Diverticulitis    Diverticulosis    Emphysema of lung (HCC) 12/02/2020   per CT scan   Headache    Hepatitis C    Hiatal hernia    Hypertension    Hypertensive retinopathy    IBS (irritable bowel syndrome)    Internal hemorrhoids    Sepsis (HCC) 11/2020   UTI (urinary tract infection)    Past Surgical History:  Procedure Laterality Date   BRAIN SURGERY  2003   decompression   BREAST EXCISIONAL BIOPSY Bilateral    age 63's   CATARACT EXTRACTION     DILATION AND CURETTAGE OF UTERUS     EYE SURGERY     OVARIAN CYST REMOVAL Bilateral    REDUCTION MAMMAPLASTY Bilateral    25+ years ago   Patient Active Problem List   Diagnosis Date Noted   Chronic pain of both knees 05/13/2022   SOBOE (shortness of breath on exertion) 04/11/2021   Weakness acquired in ICU 01/26/2021   Painful urination 11/10/2020   Left foot pain 07/14/2020   Situational anxiety 07/14/2020   Allergic rhinitis 02/11/2020   Dysfunction of Eustachian tube, bilateral 02/11/2020   Vertigo 02/11/2020   Constipation 08/20/2019   GERD (gastroesophageal reflux disease) 02/19/2019   Finger pain, right 02/20/2018   Low back pain 02/20/2018   Liver fibrosis 10/09/2017   Hep C w/o coma, chronic (HCC) 08/26/2017   Routine general medical examination  at a health care facility 08/18/2017   Right shoulder pain 08/18/2017   Essential hypertension 09/21/2015   Arnold-Chiari malformation (HCC) 09/21/2015   Insomnia 09/21/2015   PTSD (post-traumatic stress disorder) 09/21/2015    PCP:   Myrlene Broker, MD    REFERRING PROVIDER: Richardean Sale, DO   REFERRING DIAG:  M25.561,M25.562,G89.29 (ICD-10-CM) - Chronic pain of both knees  M17.0 (ICD-10-CM) - Bilateral primary osteoarthritis of knee    THERAPY DIAG:  Chronic pain of both knees  Muscle weakness (generalized)  Chronic pain of right knee  Chronic pain of left knee  Rationale for Evaluation and Treatment: Rehabilitation  ONSET DATE: ~1 yr  SUBJECTIVE:   SUBJECTIVE STATEMENT: Pt reports she was sore for 3 days after last session. Her son has been sick, so she has been caring for him over the last week and unable to walk as much as she has in past.  Knees are stiff, but not painful.   PERTINENT HISTORY: 1. Chronic pain of both knees 2. Bilateral primary osteoarthritis of knee -Chronic with exacerbation, subsequent visit - Overall decrease in flare of osteoarthritis with patient using Voltaren gel as needed, HEP - Patient tried physical therapy  and, however land-based PT significantly flared symptoms.  Patient request referral for aquatic therapy which she has benefited from in the past.  Referral sent today - May continue Voltaren gel topically as needed - Reviewed that long-term Tylenol use is not ideal with history of chronic hep C, long-term NSAID use is not ideal with history of GI pathology, IBS, GERD and episode of diverticulitis leading to sepsis.  Patient not currently interested in CSI due to history of weight gain with CSI, though could be considered at future office visit - May use turmeric and diet for general anti-inflammatory properties - Ambulatory referral to Physical Therapy  -Recommend stationary bike or elliptical or water therapy for  activity PAIN:  Are you having pain?no: NPRS scale: current 0/10, worst 5-7/10  -- no pain at rest Pain location: knees R>L Pain description: ache Aggravating factors: grocery shopping/standing 6 hours Relieving factors: Aleve, tramdol, heat  PRECAUTIONS: Knee  WEIGHT BEARING RESTRICTIONS: No  FALLS:  Has patient fallen in last 6 months? No  LIVING ENVIRONMENT: Lives with: lives with their family   OCCUPATION: retired  PLOF: Independent  PATIENT GOALS: Get my mobility back, get back to gym, doing zumba    OBJECTIVE:   DIAGNOSTIC FINDINGS: none noted  PATIENT SURVEYS:  FOTO 43% with goal of 57%  COGNITION: Overall cognitive status: Within functional limits for tasks assessed     MUSCLE LENGTH: Hamstrings: Right 80 deg; Left 80 deg   POSTURE:  slight lateral  PALPATION: Mild patellar crepitus bilat   LOWER EXTREMITY ROM:  Active ROM Right eval Left eval  Hip flexion    Hip extension    Hip abduction    Hip adduction    Hip internal rotation    Hip external rotation    Knee flexion 125 130  Knee extension 0 0  Ankle dorsiflexion    Ankle plantarflexion    Ankle inversion    Ankle eversion     (Blank rows = not tested)  LOWER EXTREMITY MMT:  MMT Right eval Left eval  Hip flexion 59.2 59.4  Hip extension    Hip abduction    Hip adduction 38.3 38.1  Hip internal rotation    Hip external rotation    Knee flexion 54.1 50.1  Knee extension    Ankle dorsiflexion    Ankle plantarflexion    Ankle inversion    Ankle eversion     (Blank rows = not tested)   FUNCTIONAL TESTS:  Timed up and go (TUG): 10.42 Berg Balance Scale: 45/56  GAIT: Distance walked: 500 ft Assistive device utilized: None Level of assistance: Complete Independence Comments: shortened step length, quick pace   TODAY'S TREATMENT:                                                                                                                              Pt seen for  aquatic therapy today.  Treatment took place in water 3.5-4.75 ft  in depth at the [facility] pool. Temp of water was 91.  Pt entered/exited the pool via stairs independently with bilat rail.  * at 4 ft without support:  walking forward and backward with reciprocal arm swing; marching; side stepping with arm abdct/addct.  *side stepping with arm addct with rainbow hand floats * farmer carry- single/bilat rainbow hand floats under water at side for increased core engagement, marching forward/ backward * UE on wall:  LE swings into hip flex/ext x 10 each LE; heel/toe raises x 12; repeated LE swings x 10 each; hip abdct/ addct x 10 each  * return to walking forward/ backward with arm swing; forward walking kicks *straddling yellow noodle: cycling holding corner;  hip abdct/addct and cross country ski * gastroc stretch at stairs, R/L x 15s each   Pt requires the buoyancy and hydrostatic pressure of water for support, and to offload joints by unweighting joint load by at least 50 % in navel deep water and by at least 75-80% in chest to neck deep water.  Viscosity of the water is needed for resistance of strengthening. Water current perturbations provides challenge to standing balance requiring increased core activation.     PATIENT EDUCATION:  Education details: Discussed eval findings, rehab rationale, aquatic program progression/POC and pools in area. Patient is in agreement  Person educated: Patient Education method: Explanation Education comprehension: verbalized understanding  HOME EXERCISE PROGRAM: TBA  ASSESSMENT:  CLINICAL IMPRESSION: Repeated same routine as last session, as to not progress exercises yet since she reported soreness for 3 days following last session.   Encouraged pt to switch from walking 5x/wk on land to 2x/wk and moving 3 days to the pool, or also incorporate stationary bike with light resistance; pt agreeable to this.   Overall good tolerance to session.  Plan to add 3 way LE stretch next visit.  Goals are ongoing.       From Eval:  Patient is a  y.o. 67  who was seen today for physical therapy evaluation and treatment for Knee pain/Oa knee. She is a very active senior participating at [employer] and taking Zumba classes. She stays busy during days with exercise and shopping on her feet continuously for 6hours +.She presents with chronic knee pain due to OA which she feels is limiting her function.  She has had an episode of sepsis then covid in past year or so which has caused some lingering weakness and lack of energy.  She is interested in learning exercises she can do in pool to enhance her activities on land to return her to her PLOF. She does have pool access. She is a good candidate for aquatic physical therapy using the properties of water to improve core and LE strengthening and balance enhancing exercises she completes indep land based.  OBJECTIVE IMPAIRMENTS: pain.   ACTIVITY LIMITATIONS: carrying, lifting, and squatting  PARTICIPATION LIMITATIONS: community activity  PERSONAL FACTORS: Fitness and Past/current experiences are also affecting patient's functional outcome.   REHAB POTENTIAL: Good  CLINICAL DECISION MAKING: Evolving/moderate complexity  EVALUATION COMPLEXITY: Moderate   GOALS: Goals reviewed with patient? Yes  SHORT TERM GOALS: Target date: 01/10/23 Pt will tolerate full aquatic sessions consistently without increase in pain and with improving function to demonstrate good toleration and effectiveness of intervention.   Baseline: Goal status: INITIAL  2. Pt to improve Foto score to 53 Baseline: 43 Goal status: INITIAL  3.  Pt will be indep with final aquatic HEP for continued management  of condition Baseline: none Goal status: INITIAL  4.  Pt will improve on Berg balance test to >/= 52/56 to demonstrate a decrease in fall risk. Baseline: 46/56 Goal status: INITIAL  5.  Pt will improve  strength in hip abd by at least 5 lbs to demonstrate improved overall physical function Baseline: see chart Goal status: INITIAL    LONG TERM GOALS: Not approp at this time. Will set when/if necessary   PLAN:  PT FREQUENCY: 1-2 week  PT DURATION: 6 weeks 6 visits (will likely complete within 4 visits).  PLANNED INTERVENTIONS: Therapeutic exercises, Therapeutic activity, Neuromuscular re-education, Balance training, Gait training, Patient/Family education, Self Care, Joint mobilization, Joint manipulation, Stair training, Orthotic/Fit training, DME instructions, Aquatic Therapy, Dry Needling, Electrical stimulation, Cryotherapy, Moist heat, Taping, Ionotophoresis 4mg /ml Dexamethasone, Manual therapy, and Re-evaluation  PLAN FOR NEXT SESSION: Begin instruction on Aquatic HEP for strengthening and stretching LE, core strengthening exercises; pain reduction OA knees.    Mayer Camel, PTA 01/03/23 12:52 PM [facility] [address] [place], [place], [address] Phone: [phone]   Fax:  [phone]

## 2023-01-06 NOTE — Progress Notes (Deleted)
    Angela Ramirez [facility] [address] [place] [address] Phone: [phone]   Assessment and Plan:     There are no diagnoses linked to this encounter.  ***   Pertinent previous records reviewed include ***   Follow Up: ***     Subjective:   I, Adrien Dietzman, am serving as a Neurosurgeon for Doctor Richardean Sale   Chief Complaint: bilateral knee pain    HPI:    05/21/2022 Patient is a 77 year old female complaining of bilateral knee pain. patient states that she is having pain in her thighs when she walks , notes weakness in he knee when she stands, been going on for a month , was taking tramadol , hx of gastric problem , is taking meloxicam every other day , is becoming more active , thinks this might be post sepsis pain , is losing weight    07/02/2022 Patient states that she is better ,not going back to PT   01/07/2023 Patient states      Relevant Historical Information: Hep C with liver fibrosis, GERD, diverticulosis, hypertension  Additional pertinent review of systems negative.   Current Outpatient Medications:    Acetaminophen-Caffeine 500-65 MG TABS, Take 1 tablet by mouth every 6 (six) hours as needed (headache). Tension relief, Disp: , Rfl:    amLODipine (NORVASC) 10 MG tablet, Take 1 tablet (10 mg total) by mouth daily., Disp: 90 tablet, Rfl: 3   hydrochlorothiazide (HYDRODIURIL) 25 MG tablet, TAKE 1 TABLET(25 MG) BY MOUTH DAILY, Disp: 90 tablet, Rfl: 3   levalbuterol (XOPENEX HFA) 45 MCG/ACT inhaler, Inhale 1-2 puffs into the lungs every 6 (six) hours as needed for wheezing or shortness of breath., Disp: 15 g, Rfl: 0   lidocaine (XYLOCAINE) 2 % solution, Use as directed 15 mLs in the mouth or throat every 6 (six) hours as needed for mouth pain., Disp: 100 mL, Rfl: 0   LINZESS 72 MCG capsule, TAKE 1 CAPSULE(72 MCG) BY MOUTH DAILY BEFORE AND BREAKFAST, Disp: 90 capsule, Rfl: 3   ondansetron (ZOFRAN) 4 MG tablet, Take 1  tablet (4 mg total) by mouth every 8 (eight) hours as needed for nausea or vomiting., Disp: 20 tablet, Rfl: 0   pantoprazole (PROTONIX) 40 MG tablet, Take 1 tablet (40 mg total) by mouth daily. Take 30-60 minutes before eating, Disp: 90 tablet, Rfl: 5   temazepam (RESTORIL) 15 MG capsule, TAKE 1 CAPSULE(15 MG) BY MOUTH TWICE DAILY AS NEEDED FOR SLEEP OR ANXIETY, Disp: 180 capsule, Rfl: 0   tiZANidine (ZANAFLEX) 2 MG tablet, TAKE 1 TABLET(2 MG) BY MOUTH EVERY 8 HOURS AS NEEDED FOR MUSCLE SPASMS, Disp: 270 tablet, Rfl: 0   traMADol (ULTRAM) 50 MG tablet, Take 1 tablet (50 mg total) by mouth every 12 (twelve) hours as needed., Disp: 60 tablet, Rfl: 5   Objective:     There were no vitals filed for this visit.    There is no height or weight on file to calculate BMI.    Physical Exam:    ***   Electronically signed by:  Angela Ramirez [facility] 2:18 PM 01/06/23

## 2023-01-07 ENCOUNTER — Ambulatory Visit: Payer: PPO | Admitting: Sports Medicine

## 2023-01-10 ENCOUNTER — Ambulatory Visit (HOSPITAL_BASED_OUTPATIENT_CLINIC_OR_DEPARTMENT_OTHER): Payer: PPO | Admitting: Physical Therapy

## 2023-01-13 NOTE — Progress Notes (Unsigned)
    Aleen Sells D.[facility] [address] [place] [address] Phone: [phone]   Assessment and Plan:     There are no diagnoses linked to this encounter.  ***   Pertinent previous records reviewed include ***   Follow Up: ***     Subjective:   I, Angela Ramirez, am serving as a Neurosurgeon for Doctor Richardean Sale   Chief Complaint: bilateral knee pain    HPI:    05/21/2022 Patient is a 77 year old female complaining of bilateral knee pain. patient states that she is having pain in her thighs when she walks , notes weakness in he knee when she stands, been going on for a month , was taking tramadol , hx of gastric problem , is taking meloxicam every other day , is becoming more active , thinks this might be post sepsis pain , is losing weight    07/02/2022 Patient states that she is better ,not going back to PT   01/14/2023 Patient states   Relevant Historical Information: Hep C with liver fibrosis, GERD, diverticulosis, hypertension  Additional pertinent review of systems negative.   Current Outpatient Medications:    Acetaminophen-Caffeine 500-65 MG TABS, Take 1 tablet by mouth every 6 (six) hours as needed (headache). Tension relief, Disp: , Rfl:    amLODipine (NORVASC) 10 MG tablet, Take 1 tablet (10 mg total) by mouth daily., Disp: 90 tablet, Rfl: 3   hydrochlorothiazide (HYDRODIURIL) 25 MG tablet, TAKE 1 TABLET(25 MG) BY MOUTH DAILY, Disp: 90 tablet, Rfl: 3   levalbuterol (XOPENEX HFA) 45 MCG/ACT inhaler, Inhale 1-2 puffs into the lungs every 6 (six) hours as needed for wheezing or shortness of breath., Disp: 15 g, Rfl: 0   lidocaine (XYLOCAINE) 2 % solution, Use as directed 15 mLs in the mouth or throat every 6 (six) hours as needed for mouth pain., Disp: 100 mL, Rfl: 0   LINZESS 72 MCG capsule, TAKE 1 CAPSULE(72 MCG) BY MOUTH DAILY BEFORE AND BREAKFAST, Disp: 90 capsule, Rfl: 3   ondansetron (ZOFRAN) 4 MG tablet, Take 1  tablet (4 mg total) by mouth every 8 (eight) hours as needed for nausea or vomiting., Disp: 20 tablet, Rfl: 0   pantoprazole (PROTONIX) 40 MG tablet, Take 1 tablet (40 mg total) by mouth daily. Take 30-60 minutes before eating, Disp: 90 tablet, Rfl: 5   temazepam (RESTORIL) 15 MG capsule, TAKE 1 CAPSULE(15 MG) BY MOUTH TWICE DAILY AS NEEDED FOR SLEEP OR ANXIETY, Disp: 180 capsule, Rfl: 0   tiZANidine (ZANAFLEX) 2 MG tablet, TAKE 1 TABLET(2 MG) BY MOUTH EVERY 8 HOURS AS NEEDED FOR MUSCLE SPASMS, Disp: 270 tablet, Rfl: 0   traMADol (ULTRAM) 50 MG tablet, Take 1 tablet (50 mg total) by mouth every 12 (twelve) hours as needed., Disp: 60 tablet, Rfl: 5   Objective:     There were no vitals filed for this visit.    There is no height or weight on file to calculate BMI.    Physical Exam:    ***   Electronically signed by:  Aleen Sells [facility] 7:13 AM 01/13/23

## 2023-01-14 ENCOUNTER — Ambulatory Visit (INDEPENDENT_AMBULATORY_CARE_PROVIDER_SITE_OTHER): Payer: PPO | Admitting: Internal Medicine

## 2023-01-14 ENCOUNTER — Encounter: Payer: Self-pay | Admitting: Internal Medicine

## 2023-01-14 ENCOUNTER — Ambulatory Visit: Payer: PPO | Admitting: Sports Medicine

## 2023-01-14 VITALS — BP 160/80 | HR 86 | Temp 98.3°F | Ht 66.0 in | Wt 207.0 lb

## 2023-01-14 VITALS — BP 138/88 | HR 93 | Ht 66.0 in | Wt 207.0 lb

## 2023-01-14 DIAGNOSIS — M25562 Pain in left knee: Secondary | ICD-10-CM | POA: Diagnosis not present

## 2023-01-14 DIAGNOSIS — G8929 Other chronic pain: Secondary | ICD-10-CM | POA: Diagnosis not present

## 2023-01-14 DIAGNOSIS — M170 Bilateral primary osteoarthritis of knee: Secondary | ICD-10-CM

## 2023-01-14 DIAGNOSIS — Z6833 Body mass index (BMI) 33.0-33.9, adult: Secondary | ICD-10-CM | POA: Diagnosis not present

## 2023-01-14 DIAGNOSIS — E669 Obesity, unspecified: Secondary | ICD-10-CM

## 2023-01-14 DIAGNOSIS — M25561 Pain in right knee: Secondary | ICD-10-CM | POA: Diagnosis not present

## 2023-01-14 MED ORDER — ZEPBOUND 5 MG/0.5ML ~~LOC~~ SOAJ: 5.0000 mg | SUBCUTANEOUS | 0 refills | Status: DC

## 2023-01-14 MED ORDER — ZEPBOUND 7.5 MG/0.5ML ~~LOC~~ SOAJ: 7.5000 mg | SUBCUTANEOUS | 0 refills | Status: DC

## 2023-01-14 MED ORDER — ZEPBOUND 2.5 MG/0.5ML ~~LOC~~ SOAJ: 2.5000 mg | SUBCUTANEOUS | 0 refills | Status: DC

## 2023-01-14 NOTE — Patient Instructions (Addendum)
-   Start meloxicam 15 mg daily x2 weeks. May use remaining meloxicam as needed once daily for pain control.  Do not to use additional NSAIDs while taking meloxicam.  May use Tylenol [phone] mg 2 to 3 times a day for breakthrough pain. Continue Physical activity as tolerated 3 week follow up

## 2023-01-14 NOTE — Progress Notes (Signed)
   Subjective:   Patient ID: Angela Ramirez, female    DOB: [birth year], 77 y.o.   MRN: [id number]  HPI The patient is a 77 YO female coming in for right knee pain and doing better with water therapy. Concerns about weight would like to try something.   Review of Systems  Constitutional: Negative.   HENT: Negative.    Eyes: Negative.   Respiratory:  Negative for cough, chest tightness and shortness of breath.   Cardiovascular:  Negative for chest pain, palpitations and leg swelling.  Gastrointestinal:  Negative for abdominal distention, abdominal pain, constipation, diarrhea, nausea and vomiting.  Musculoskeletal:  Positive for arthralgias.  Skin: Negative.   Neurological: Negative.   Psychiatric/Behavioral: Negative.      Objective:  Physical Exam Constitutional:      Appearance: She is well-developed. She is obese.  HENT:     Head: Normocephalic and atraumatic.  Cardiovascular:     Rate and Rhythm: Normal rate and regular rhythm.  Pulmonary:     Effort: Pulmonary effort is normal. No respiratory distress.     Breath sounds: Normal breath sounds. No wheezing or rales.  Abdominal:     General: Bowel sounds are normal. There is no distension.     Palpations: Abdomen is soft.     Tenderness: There is no abdominal tenderness. There is no rebound.  Musculoskeletal:     Cervical back: Normal range of motion.  Skin:    General: Skin is warm and dry.  Neurological:     Mental Status: She is alert and oriented to person, place, and time.     Coordination: Coordination normal.     Vitals:   01/14/23 1001 01/14/23 1008  BP: (!) 160/80 (!) 160/80  Pulse: 86   Temp: 98.3 F (36.8 C)   TempSrc: Oral   SpO2: 98%   Weight: 207 lb (93.9 kg)   Height: 5\' 6"  (1.676 m)     Assessment & Plan:

## 2023-01-14 NOTE — Patient Instructions (Signed)
Zepbound is the medicine for weight loss we have sent in

## 2023-01-17 ENCOUNTER — Ambulatory Visit (HOSPITAL_BASED_OUTPATIENT_CLINIC_OR_DEPARTMENT_OTHER): Payer: PPO | Admitting: Physical Therapy

## 2023-01-17 ENCOUNTER — Encounter (HOSPITAL_BASED_OUTPATIENT_CLINIC_OR_DEPARTMENT_OTHER): Payer: Self-pay | Admitting: Physical Therapy

## 2023-01-17 DIAGNOSIS — G8929 Other chronic pain: Secondary | ICD-10-CM

## 2023-01-17 DIAGNOSIS — M6281 Muscle weakness (generalized): Secondary | ICD-10-CM

## 2023-01-17 DIAGNOSIS — E669 Obesity, unspecified: Secondary | ICD-10-CM | POA: Insufficient documentation

## 2023-01-17 DIAGNOSIS — M25561 Pain in right knee: Secondary | ICD-10-CM | POA: Diagnosis not present

## 2023-01-17 NOTE — Therapy (Signed)
OUTPATIENT PHYSICAL THERAPY LOWER EXTREMITY TREATMENT/PN/Re-cert  Progress Note Reporting Period 11/23/22 to 01/17/23  See note below for Objective Data and Assessment of Progress/Goals.     Patient Name: Angela Ramirez MRN: [id number] DOB:1945-07-15, 77 y.o., female Today's Date: 01/17/2023  END OF SESSION:  PT End of Session - 01/17/23 1323     Visit Number 4    Number of Visits 8    Date for PT Re-Evaluation 02/21/23    Authorization Type HTA MCR    PT Start Time 0902    PT Stop Time 0945    PT Time Calculation (min) 43 min    Activity Tolerance Patient tolerated treatment well    Behavior During Therapy WFL for tasks assessed/performed              Past Medical History:  Diagnosis Date   Anxiety    Aortic atherosclerosis (HCC)    Arnold-Chiari malformation (HCC)    Arthritis    Cirrhosis (HCC)    Colon polyps    Depression    Diverticulitis    Diverticulosis    Emphysema of lung (HCC) 12/02/2020   per CT scan   Headache    Hepatitis C    Hiatal hernia    Hypertension    Hypertensive retinopathy    IBS (irritable bowel syndrome)    Internal hemorrhoids    Sepsis (HCC) 11/2020   UTI (urinary tract infection)    Past Surgical History:  Procedure Laterality Date   BRAIN SURGERY  2003   decompression   BREAST EXCISIONAL BIOPSY Bilateral    age 50's   CATARACT EXTRACTION     DILATION AND CURETTAGE OF UTERUS     EYE SURGERY     OVARIAN CYST REMOVAL Bilateral    REDUCTION MAMMAPLASTY Bilateral    25+ years ago   Patient Active Problem List   Diagnosis Date Noted   Obesity (BMI 30.0-34.9) 01/17/2023   Chronic pain of both knees 05/13/2022   SOBOE (shortness of breath on exertion) 04/11/2021   Weakness acquired in ICU 01/26/2021   Painful urination 11/10/2020   Left foot pain 07/14/2020   Situational anxiety 07/14/2020   Allergic rhinitis 02/11/2020   Dysfunction of Eustachian tube, bilateral 02/11/2020   Vertigo 02/11/2020   Constipation  08/20/2019   GERD (gastroesophageal reflux disease) 02/19/2019   Finger pain, right 02/20/2018   Low back pain 02/20/2018   Liver fibrosis 10/09/2017   Hep C w/o coma, chronic (HCC) 08/26/2017   Routine general medical examination at a health care facility 08/18/2017   Right shoulder pain 08/18/2017   Essential hypertension 09/21/2015   Arnold-Chiari malformation (HCC) 09/21/2015   Insomnia 09/21/2015   PTSD (post-traumatic stress disorder) 09/21/2015    PCP:   Myrlene Broker, MD    REFERRING PROVIDER: Richardean Sale, DO   REFERRING DIAG:  M25.561,M25.562,G89.29 (ICD-10-CM) - Chronic pain of both knees  M17.0 (ICD-10-CM) - Bilateral primary osteoarthritis of knee    THERAPY DIAG:  Chronic pain of both knees  Muscle weakness (generalized)  Rationale for Evaluation and Treatment: Rehabilitation  ONSET DATE: ~1 yr  SUBJECTIVE:   SUBJECTIVE STATEMENT: I have been walking in the pool 3 x week for 40 minutes.  Md has order meloxacam trial to see if it helps and I will start gel shots in my knees.   PERTINENT HISTORY: 1. Chronic pain of both knees 2. Bilateral primary osteoarthritis of knee -Chronic with exacerbation, subsequent visit - Overall decrease in flare of osteoarthritis  with patient using Voltaren gel as needed, HEP - Patient tried physical therapy and, however land-based PT significantly flared symptoms.  Patient request referral for aquatic therapy which she has benefited from in the past.  Referral sent today - May continue Voltaren gel topically as needed - Reviewed that long-term Tylenol use is not ideal with history of chronic hep C, long-term NSAID use is not ideal with history of GI pathology, IBS, GERD and episode of diverticulitis leading to sepsis.  Patient not currently interested in CSI due to history of weight gain with CSI, though could be considered at future office visit - May use turmeric and diet for general anti-inflammatory  properties - Ambulatory referral to Physical Therapy  -Recommend stationary bike or elliptical or water therapy for activity PAIN:  Are you having pain?no: NPRS scale: current 5/10, worst 5-7/10  -- no pain at rest Pain location: knees R>L Pain description: ache Aggravating factors: grocery shopping/standing 6 hours Relieving factors: Aleve, tramdol, heat  PRECAUTIONS: Knee  WEIGHT BEARING RESTRICTIONS: No  FALLS:  Has patient fallen in last 6 months? No  LIVING ENVIRONMENT: Lives with: lives with their family   OCCUPATION: retired  PLOF: Independent  PATIENT GOALS: Get my mobility back, get back to gym, doing zumba    OBJECTIVE:   DIAGNOSTIC FINDINGS: none noted  PATIENT SURVEYS:  FOTO 43% with goal of 57% 01/17/23: 44%  COGNITION: Overall cognitive status: Within functional limits for tasks assessed     MUSCLE LENGTH: Hamstrings: Right 80 deg; Left 80 deg   POSTURE:  slight lateral  PALPATION: Mild patellar crepitus bilat   LOWER EXTREMITY ROM:  Active ROM Right eval Left eval  Hip flexion    Hip extension    Hip abduction    Hip adduction    Hip internal rotation    Hip external rotation    Knee flexion 125 130  Knee extension 0 0  Ankle dorsiflexion    Ankle plantarflexion    Ankle inversion    Ankle eversion     (Blank rows = not tested)  LOWER EXTREMITY MMT:  MMT Right eval Left eval Right / Left 01/17/23  Hip flexion 59.2 59.4 70.0 / 54  Hip extension     Hip abduction     Hip adduction 38.3 38.1 35.8 / 39.1  Hip internal rotation     Hip external rotation     Knee flexion 54.1 50.1 54.1 / 53.1  Knee extension     Ankle dorsiflexion     Ankle plantarflexion     Ankle inversion     Ankle eversion      (Blank rows = not tested)   FUNCTIONAL TESTS:  Timed up and go (TUG): 10.42 Berg Balance Scale: 45/56  01/17/23:  10.81   GAIT: Distance walked: 500 ft Assistive device utilized: None Level of assistance: Complete  Independence Comments: shortened step length, quick pace   TODAY'S TREATMENT:  Pt seen for aquatic therapy today.  Treatment took place in water 3.5-4.75 ft in depth at the [facility] pool. Temp of water was 91.  Pt entered/exited the pool via stairs independently with bilat rail.  * at 4 ft without support:  walking forward and backward with reciprocal arm swing; marching; side stepping with arm abdct/addct.  *side stepping with arm addct with rainbow hand floats * farmer carry- single/bilat rainbow hand floats under water at side for increased core engagement, marching forward/ backward  Pt requires the buoyancy and hydrostatic pressure of water for support, and to offload joints by unweighting joint load by at least 50 % in navel deep water and by at least 75-80% in chest to neck deep water.  Viscosity of the water is needed for resistance of strengthening. Water current perturbations provides challenge to standing balance requiring increased core activation.     PATIENT EDUCATION:  Education details: Discussed eval findings, rehab rationale, aquatic program progression/POC and pools in area. Patient is in agreement  Person educated: Patient Education method: Explanation Education comprehension: verbalized understanding  HOME EXERCISE PROGRAM: TBA  ASSESSMENT:  CLINICAL IMPRESSION: PN: Pt has been seen on limited bases due to illness and scheduling conflicts.  She has returned to home pool and has been walking x 40 mins 3 x weekly.  With functional testing today she demonstrates small improvements in strength but minimal overall advancements due to infrequent and sporadic therapy sessions.  She will continue to benefit from skilled PT aquatic intervention to continue to improve core and LE strengthening and balance with instruction on long term aquatic  HEP      OBJECTIVE IMPAIRMENTS: pain.   ACTIVITY LIMITATIONS: carrying, lifting, and squatting  PARTICIPATION LIMITATIONS: community activity  PERSONAL FACTORS: Fitness and Past/current experiences are also affecting patient's functional outcome.   REHAB POTENTIAL: Good  CLINICAL DECISION MAKING: Evolving/moderate complexity  EVALUATION COMPLEXITY: Moderate   GOALS: Goals reviewed with patient? Yes  SHORT TERM GOALS: Target date: 01/10/23 Pt will tolerate full aquatic sessions consistently without increase in pain and with improving function to demonstrate good toleration and effectiveness of intervention.   Baseline: Goal status: INITIAL  2. Pt to improve Foto score to 53 Baseline: 43 Goal status: INITIAL  3.  Pt will be indep with final aquatic HEP for continued management of condition Baseline: none Goal status: INITIAL  4.  Pt will improve on Berg balance test to >/= 52/56 to demonstrate a decrease in fall risk. Baseline: 46/56 Goal status: INITIAL  5.  Pt will improve strength in hip abd by at least 5 lbs to demonstrate improved overall physical function Baseline: see chart Goal status: INITIAL    LONG TERM GOALS: Not approp at this time. Will set when/if necessary   PLAN:  PT FREQUENCY: 1 x week  PT DURATION: 5 weeks  PLANNED INTERVENTIONS: Therapeutic exercises, Therapeutic activity, Neuromuscular re-education, Balance training, Gait training, Patient/Family education, Self Care, Joint mobilization, Joint manipulation, Stair training, Orthotic/Fit training, DME instructions, Aquatic Therapy, Dry Needling, Electrical stimulation, Cryotherapy, Moist heat, Taping, Ionotophoresis 4mg /ml Dexamethasone, Manual therapy, and Re-evaluation  PLAN FOR NEXT SESSION: Begin instruction on Aquatic HEP for strengthening and stretching LE, core strengthening exercises; pain reduction OA knees.    [address] [place]) Branda Chaudhary MPT 01/17/23 1:24 PM [facility]  [facility] [address] [place], [place], [address] Phone: [phone]   Fax:  [phone]

## 2023-01-17 NOTE — Assessment & Plan Note (Signed)
She has concurrent GERD, arthritis and hypertension. Treatment is warranted and rx zepbound. Counseled about use, side effects. She will follow up in 2-3 months.
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
## 2023-01-20 ENCOUNTER — Encounter (INDEPENDENT_AMBULATORY_CARE_PROVIDER_SITE_OTHER): Payer: Self-pay | Admitting: Ophthalmology

## 2023-01-20 ENCOUNTER — Ambulatory Visit (INDEPENDENT_AMBULATORY_CARE_PROVIDER_SITE_OTHER): Payer: PPO | Admitting: Ophthalmology

## 2023-01-20 DIAGNOSIS — H353221 Exudative age-related macular degeneration, left eye, with active choroidal neovascularization: Secondary | ICD-10-CM | POA: Diagnosis not present

## 2023-01-20 DIAGNOSIS — H4312 Vitreous hemorrhage, left eye: Secondary | ICD-10-CM

## 2023-01-20 DIAGNOSIS — H5213 Myopia, bilateral: Secondary | ICD-10-CM

## 2023-01-20 DIAGNOSIS — H532 Diplopia: Secondary | ICD-10-CM | POA: Diagnosis not present

## 2023-01-20 DIAGNOSIS — H35033 Hypertensive retinopathy, bilateral: Secondary | ICD-10-CM | POA: Diagnosis not present

## 2023-01-20 DIAGNOSIS — H43813 Vitreous degeneration, bilateral: Secondary | ICD-10-CM

## 2023-01-20 DIAGNOSIS — Z961 Presence of intraocular lens: Secondary | ICD-10-CM

## 2023-01-20 DIAGNOSIS — I10 Essential (primary) hypertension: Secondary | ICD-10-CM

## 2023-01-20 DIAGNOSIS — H442E3 Degenerative myopia with other maculopathy, bilateral eye: Secondary | ICD-10-CM

## 2023-01-20 MED ORDER — AFLIBERCEPT 2MG/0.05ML IZ SOLN FOR KALEIDOSCOPE
2.0000 mg | INTRAVITREAL | Status: AC | PRN
  Administered 2023-01-20: 2 mg via INTRAVITREAL

## 2023-01-21 ENCOUNTER — Ambulatory Visit (HOSPITAL_BASED_OUTPATIENT_CLINIC_OR_DEPARTMENT_OTHER): Payer: PPO | Admitting: Physical Therapy

## 2023-01-21 ENCOUNTER — Encounter (HOSPITAL_BASED_OUTPATIENT_CLINIC_OR_DEPARTMENT_OTHER): Payer: Self-pay | Admitting: Physical Therapy

## 2023-01-21 ENCOUNTER — Other Ambulatory Visit: Payer: Self-pay | Admitting: Internal Medicine

## 2023-01-21 DIAGNOSIS — M6281 Muscle weakness (generalized): Secondary | ICD-10-CM

## 2023-01-21 DIAGNOSIS — M25561 Pain in right knee: Secondary | ICD-10-CM | POA: Diagnosis not present

## 2023-01-21 DIAGNOSIS — G8929 Other chronic pain: Secondary | ICD-10-CM

## 2023-01-21 NOTE — Therapy (Signed)
OUTPATIENT PHYSICAL THERAPY LOWER EXTREMITY TREATMENT   Patient Name: Angela Ramirez MRN: [id number] DOB:May 05, 1946, 77 y.o., female Today's Date: 01/21/2023  END OF SESSION:  PT End of Session - 01/21/23 0833     Visit Number 5    Number of Visits 8    Date for PT Re-Evaluation 02/21/23    Authorization Type HTA MCR    PT Start Time 0815    PT Stop Time 0855    PT Time Calculation (min) 40 min    Activity Tolerance Patient tolerated treatment well    Behavior During Therapy [facility] for tasks assessed/performed               Past Medical History:  Diagnosis Date   Anxiety    Aortic atherosclerosis (HCC)    Arnold-Chiari malformation (HCC)    Arthritis    Cirrhosis (HCC)    Colon polyps    Depression    Diverticulitis    Diverticulosis    Emphysema of lung (HCC) 12/02/2020   per CT scan   Headache    Hepatitis C    Hiatal hernia    Hypertension    Hypertensive retinopathy    IBS (irritable bowel syndrome)    Internal hemorrhoids    Sepsis (HCC) 11/2020   UTI (urinary tract infection)    Past Surgical History:  Procedure Laterality Date   BRAIN SURGERY  2003   decompression   BREAST EXCISIONAL BIOPSY Bilateral    age 72's   CATARACT EXTRACTION     DILATION AND CURETTAGE OF UTERUS     EYE SURGERY     OVARIAN CYST REMOVAL Bilateral    REDUCTION MAMMAPLASTY Bilateral    25+ years ago   Patient Active Problem List   Diagnosis Date Noted   Obesity (BMI 30.0-34.9) 01/17/2023   Chronic pain of both knees 05/13/2022   SOBOE (shortness of breath on exertion) 04/11/2021   Weakness acquired in ICU 01/26/2021   Painful urination 11/10/2020   Left foot pain 07/14/2020   Situational anxiety 07/14/2020   Allergic rhinitis 02/11/2020   Dysfunction of Eustachian tube, bilateral 02/11/2020   Vertigo 02/11/2020   Constipation 08/20/2019   GERD (gastroesophageal reflux disease) 02/19/2019   Finger pain, right 02/20/2018   Low back pain 02/20/2018   Liver  fibrosis 10/09/2017   Hep C w/o coma, chronic (HCC) 08/26/2017   Routine general medical examination at a health care facility 08/18/2017   Right shoulder pain 08/18/2017   Essential hypertension 09/21/2015   Arnold-Chiari malformation (HCC) 09/21/2015   Insomnia 09/21/2015   PTSD (post-traumatic stress disorder) 09/21/2015    PCP:   Myrlene Broker, MD    REFERRING PROVIDER: Richardean Sale, DO   REFERRING DIAG:  M25.561,M25.562,G89.29 (ICD-10-CM) - Chronic pain of both knees  M17.0 (ICD-10-CM) - Bilateral primary osteoarthritis of knee    THERAPY DIAG:  Chronic pain of both knees  Muscle weakness (generalized)  Chronic pain of right knee  Chronic pain of left knee  Rationale for Evaluation and Treatment: Rehabilitation  ONSET DATE: ~1 yr  SUBJECTIVE:   SUBJECTIVE STATEMENT: Pt reports she has been off of her schedule of exercise due to dr appts.  "My knees have been super sore".   PERTINENT HISTORY: 1. Chronic pain of both knees 2. Bilateral primary osteoarthritis of knee -Chronic with exacerbation, subsequent visit - Overall decrease in flare of osteoarthritis with patient using Voltaren gel as needed, HEP - Patient tried physical therapy and, however land-based PT significantly flared  symptoms.  Patient request referral for aquatic therapy which she has benefited from in the past.  Referral sent today - May continue Voltaren gel topically as needed - Reviewed that long-term Tylenol use is not ideal with history of chronic hep C, long-term NSAID use is not ideal with history of GI pathology, IBS, GERD and episode of diverticulitis leading to sepsis.  Patient not currently interested in CSI due to history of weight gain with CSI, though could be considered at future office visit - May use turmeric and diet for general anti-inflammatory properties - Ambulatory referral to Physical Therapy  -Recommend stationary bike or elliptical or water therapy for  activity PAIN:  Are you having pain?no: NPRS scale: 0/10 - took pain medicine 1.5 hr before appt Pain location: knees R>L Pain description: ache Aggravating factors: grocery shopping/standing 6 hours Relieving factors: Aleve, tramdol, heat  PRECAUTIONS: Knee  WEIGHT BEARING RESTRICTIONS: No  FALLS:  Has patient fallen in last 6 months? No  LIVING ENVIRONMENT: Lives with: lives with their family   OCCUPATION: retired  PLOF: Independent  PATIENT GOALS: Get my mobility back, get back to gym, doing zumba    OBJECTIVE:   DIAGNOSTIC FINDINGS: none noted  PATIENT SURVEYS:  FOTO 43% with goal of 57% 01/17/23: 44%  COGNITION: Overall cognitive status: Within functional limits for tasks assessed     MUSCLE LENGTH: Hamstrings: Right 80 deg; Left 80 deg   POSTURE:  slight lateral  PALPATION: Mild patellar crepitus bilat   LOWER EXTREMITY ROM:  Active ROM Right eval Left eval  Hip flexion    Hip extension    Hip abduction    Hip adduction    Hip internal rotation    Hip external rotation    Knee flexion 125 130  Knee extension 0 0  Ankle dorsiflexion    Ankle plantarflexion    Ankle inversion    Ankle eversion     (Blank rows = not tested)  LOWER EXTREMITY MMT:  MMT Right eval Left eval Right / Left 01/17/23  Hip flexion 59.2 59.4 70.0 / 54  Hip extension     Hip abduction     Hip adduction 38.3 38.1 35.8 / 39.1  Hip internal rotation     Hip external rotation     Knee flexion 54.1 50.1 54.1 / 53.1  Knee extension     Ankle dorsiflexion     Ankle plantarflexion     Ankle inversion     Ankle eversion      (Blank rows = not tested)   FUNCTIONAL TESTS:  Timed up and go (TUG): 10.42 Berg Balance Scale: 45/56  01/17/23:  10.81   GAIT: Distance walked: 500 ft Assistive device utilized: None Level of assistance: Complete Independence Comments: shortened step length, quick pace   TODAY'S TREATMENT:  Pt seen for aquatic therapy today.  Treatment took place in water 3.5-4.75 ft in depth at the [facility] pool. Temp of water was 91.  Pt entered/exited the pool via stairs independently with bilat rail.  * at 4 ft without support:  walking forward and backward with reciprocal arm swing; * side stepping with arm abdct/addct with rainbow hand floats * farmer carry- single/bilat rainbow hand floats under water at side for increased core engagement, marching forward/ backward * UE on yellow hand float: 3 way LE kick 2 sets of 5  * relaxed vertical squats x 5 * seated on 4th stair from bottom:  circuit of cycling, hip abdct/ addct, and flutter kick - 4 rounds * standing quad stretch with foot on 2nd step, 20sec 2 reps each LE * standing hamstring stretch with foot on 3rd step, 20 sec x 2 each LE * gastroc stretch with heel off of step, 20 sec each LE  Pt requires the buoyancy and hydrostatic pressure of water for support, and to offload joints by unweighting joint load by at least 50 % in navel deep water and by at least 75-80% in chest to neck deep water.  Viscosity of the water is needed for resistance of strengthening. Water current perturbations provides challenge to standing balance requiring increased core activation.     PATIENT EDUCATION:  Education details: exercise rationale / modification/ progression   Person educated: Patient Education method: Explanation Education comprehension: verbalized understanding  HOME EXERCISE PROGRAM: Access Code: [email or web address] URL: [email or web address] Date: 01/21/2023 Prepared by: [facility] * not issued yet  ASSESSMENT:  CLINICAL IMPRESSION: Pt tolerated all exercises well without production of pain.  HEP created; will plan to edit/ laminate/ issue next visit.  Pt will continue to benefit from skilled PT aquatic  intervention to continue to improve core and LE strengthening and balance with instruction on long term aquatic HEP.    OBJECTIVE IMPAIRMENTS: pain.   ACTIVITY LIMITATIONS: carrying, lifting, and squatting  PARTICIPATION LIMITATIONS: community activity  PERSONAL FACTORS: Fitness and Past/current experiences are also affecting patient's functional outcome.   REHAB POTENTIAL: Good  CLINICAL DECISION MAKING: Evolving/moderate complexity  EVALUATION COMPLEXITY: Moderate   GOALS: Goals reviewed with patient? Yes  SHORT TERM GOALS: Target date: 02/21/23 Pt will tolerate full aquatic sessions consistently without increase in pain and with improving function to demonstrate good toleration and effectiveness of intervention.   Baseline: Goal status: INITIAL  2. Pt to improve Foto score to 53 Baseline: 43 Goal status: INITIAL  3.  Pt will be indep with final aquatic HEP for continued management of condition Baseline: none Goal status: INITIAL  4.  Pt will improve on Berg balance test to >/= 52/56 to demonstrate a decrease in fall risk. Baseline: 46/56 Goal status: INITIAL  5.  Pt will improve strength in hip abd by at least 5 lbs to demonstrate improved overall physical function Baseline: see chart Goal status: INITIAL    LONG TERM GOALS: Not approp at this time. Will set when/if necessary   PLAN:  PT FREQUENCY: 1 x week  PT DURATION: 5 weeks  PLANNED INTERVENTIONS: Therapeutic exercises, Therapeutic activity, Neuromuscular re-education, Balance training, Gait training, Patient/Family education, Self Care, Joint mobilization, Joint manipulation, Stair training, Orthotic/Fit training, DME instructions, Aquatic Therapy, Dry Needling, Electrical stimulation, Cryotherapy, Moist heat, Taping, Ionotophoresis 4mg /ml Dexamethasone, Manual therapy, and Re-evaluation  PLAN FOR NEXT SESSION: Begin instruction on Aquatic HEP for strengthening and stretching LE, core strengthening  exercises;  pain reduction OA knees.    Mayer Camel, PTA 01/21/23 1:42 PM [facility] [address] [place], [place], [address] Phone: [phone]   Fax:  [phone]

## 2023-01-24 ENCOUNTER — Encounter (INDEPENDENT_AMBULATORY_CARE_PROVIDER_SITE_OTHER): Payer: PPO | Admitting: Ophthalmology

## 2023-01-27 ENCOUNTER — Ambulatory Visit (INDEPENDENT_AMBULATORY_CARE_PROVIDER_SITE_OTHER): Payer: PPO | Admitting: Internal Medicine

## 2023-01-27 ENCOUNTER — Encounter: Payer: Self-pay | Admitting: Internal Medicine

## 2023-01-27 VITALS — BP 138/80 | HR 89 | Temp 98.0°F | Ht 66.0 in | Wt 209.0 lb

## 2023-01-27 DIAGNOSIS — F5104 Psychophysiologic insomnia: Secondary | ICD-10-CM

## 2023-01-27 DIAGNOSIS — R42 Dizziness and giddiness: Secondary | ICD-10-CM

## 2023-01-27 DIAGNOSIS — I10 Essential (primary) hypertension: Secondary | ICD-10-CM | POA: Diagnosis not present

## 2023-01-27 DIAGNOSIS — Q0700 Arnold-Chiari syndrome without spina bifida or hydrocephalus: Secondary | ICD-10-CM

## 2023-01-27 DIAGNOSIS — K7400 Hepatic fibrosis, unspecified: Secondary | ICD-10-CM | POA: Diagnosis not present

## 2023-01-27 DIAGNOSIS — K219 Gastro-esophageal reflux disease without esophagitis: Secondary | ICD-10-CM | POA: Diagnosis not present

## 2023-01-27 DIAGNOSIS — F418 Other specified anxiety disorders: Secondary | ICD-10-CM

## 2023-01-27 DIAGNOSIS — M545 Low back pain, unspecified: Secondary | ICD-10-CM

## 2023-01-27 DIAGNOSIS — Z0000 Encounter for general adult medical examination without abnormal findings: Secondary | ICD-10-CM | POA: Diagnosis not present

## 2023-01-27 LAB — CBC
HCT: 39.4 % (ref 36.0–46.0)
Hemoglobin: 12.8 g/dL (ref 12.0–15.0)
MCHC: 32.5 g/dL (ref 30.0–36.0)
MCV: 88.5 fl (ref 78.0–100.0)
Platelets: 261 10*3/uL (ref 150.0–400.0)
RBC: 4.45 Mil/uL (ref 3.87–5.11)
RDW: 14.6 % (ref 11.5–15.5)
WBC: 5.8 10*3/uL (ref 4.0–10.5)

## 2023-01-27 LAB — COMPREHENSIVE METABOLIC PANEL
ALT: 10 U/L (ref 0–35)
AST: 24 U/L (ref 0–37)
Albumin: 4.3 g/dL (ref 3.5–5.2)
Alkaline Phosphatase: 77 U/L (ref 39–117)
BUN: 14 mg/dL (ref 6–23)
CO2: 30 mEq/L (ref 19–32)
Calcium: 9.8 mg/dL (ref 8.4–10.5)
Chloride: 101 mEq/L (ref 96–112)
Creatinine, Ser: 0.71 mg/dL (ref 0.40–1.20)
GFR: 83.61 mL/min (ref >60.00)
Glucose, Bld: 97 mg/dL (ref 70–99)
Potassium: 4.5 mEq/L (ref 3.5–5.1)
Sodium: 138 mEq/L (ref 135–145)
Total Bilirubin: 0.4 mg/dL (ref 0.2–1.2)
Total Protein: 7.6 g/dL (ref 6.0–8.3)

## 2023-01-27 LAB — VITAMIN D 25 HYDROXY (VIT D DEFICIENCY, FRACTURES): VITD: 41.24 ng/mL (ref 30.00–100.00)

## 2023-01-27 LAB — LIPID PANEL
Cholesterol: 173 mg/dL (ref 0–200)
HDL: 55.7 mg/dL (ref >39.00)
LDL Cholesterol: 101 mg/dL — ABNORMAL HIGH (ref 0–99)
NonHDL: 117.01
Total CHOL/HDL Ratio: 3
Triglycerides: 79 mg/dL (ref 0.0–149.0)
VLDL: 15.8 mg/dL (ref 0.0–40.0)

## 2023-01-27 LAB — TSH: TSH: 1.45 u[IU]/mL (ref 0.35–5.50)

## 2023-01-27 LAB — HEMOGLOBIN A1C: Hgb A1c MFr Bld: 6 % (ref 4.6–6.5)

## 2023-01-27 MED ORDER — ONDANSETRON HCL 4 MG PO TABS: 4.0000 mg | ORAL_TABLET | Freq: Three times a day (TID) | ORAL | 3 refills | Status: DC | PRN

## 2023-01-27 MED ORDER — MELOXICAM 15 MG PO TABS: 15.0000 mg | ORAL_TABLET | Freq: Every day | ORAL | 3 refills | Status: DC

## 2023-01-27 MED ORDER — TEMAZEPAM 15 MG PO CAPS: 15.0000 mg | ORAL_CAPSULE | Freq: Every evening | ORAL | 3 refills | Status: DC | PRN

## 2023-01-27 MED ORDER — HYDROCHLOROTHIAZIDE 25 MG PO TABS: 25.0000 mg | ORAL_TABLET | Freq: Every day | ORAL | 3 refills | Status: DC

## 2023-01-27 MED ORDER — MECLIZINE HCL 12.5 MG PO TABS: 12.5000 mg | ORAL_TABLET | Freq: Three times a day (TID) | ORAL | 0 refills | Status: AC | PRN

## 2023-01-27 MED ORDER — AMLODIPINE BESYLATE 10 MG PO TABS: 10.0000 mg | ORAL_TABLET | Freq: Every day | ORAL | 3 refills | Status: DC

## 2023-01-27 MED ORDER — LINACLOTIDE 72 MCG PO CAPS: 72.0000 ug | ORAL_CAPSULE | Freq: Every day | ORAL | 3 refills | Status: DC

## 2023-01-27 NOTE — Assessment & Plan Note (Signed)
Taking protonix 40 mg daily and will continue.  ?

## 2023-01-27 NOTE — Assessment & Plan Note (Signed)
Rx meloxicam 15 mg daily prn and uses tizanidine 2 mg tid prn and tramadol 50 mg bid prn (uses rarely). She understands which to utilize and is doing PT to help knee and back pain as well.

## 2023-01-27 NOTE — Progress Notes (Signed)
Subjective:   Patient ID: Angela Ramirez, female    DOB: Oct 11, 1945, 77 y.o.   MRN: [id number]  HPI Here for medicare wellness and physical, no new complaints. Please see A/P for status and treatment of chronic medical problems.   Diet: heart healthy Physical activity: active Depression/mood screen: negative Hearing: intact to whispered voice, mild loss bilaterally Visual acuity: grossly normal with lens, performs annual eye exam  ADLs: capable Fall risk: none Home safety: good Cognitive evaluation: intact to orientation, naming, recall and repetition EOL planning: adv directives discussed  Flowsheet Row Office Visit from 01/14/2023 in [facility] [place]  PHQ-2 Total Score 0       Flowsheet Row Office Visit from 05/13/2022 in [facility] [place]  PHQ-9 Total Score 1         04/22/2022   10:27 AM 05/13/2022    8:47 AM 08/12/2022   11:03 AM 01/14/2023   10:08 AM 01/27/2023    9:27 AM  Fall Risk  Falls in the past year? 0  0 0 0  Was there an injury with Fall? 0 0 0 0 0  Fall Risk Category Calculator 0  0 0 0  Fall Risk Category (Retired) Low      Patient at Risk for Falls Due to   No Fall Risks    Fall risk Follow up  Falls evaluation completed Falls evaluation completed Falls evaluation completed Falls evaluation completed    I have personally reviewed and have noted 1. The patient's medical and social history - reviewed today no changes 2. Their use of alcohol, tobacco or illicit drugs 3. Their current medications and supplements 4. The patient's functional ability including ADL's, fall risks, home safety risks and hearing or visual impairment. 5. Diet and physical activities 6. Evidence for depression or mood disorders 7. Care team reviewed and updated 8.  The patient has access to tramadol as needed for pain. She understands the risks and benefits and opioid risk tool was utilized and appropriate.   Patient  Care Team: Myrlene Broker, MD as PCP - General (Internal Medicine) Kathyrn Sheriff, [facility] (Inactive) as Pharmacist (Pharmacist) Past Medical History:  Diagnosis Date   Anxiety    Aortic atherosclerosis (HCC)    Arnold-Chiari malformation (HCC)    Arthritis    Cirrhosis (HCC)    Colon polyps    Depression    Diverticulitis    Diverticulosis    Emphysema of lung (HCC) 12/02/2020   per CT scan   Headache    Hepatitis C    Hiatal hernia    Hypertension    Hypertensive retinopathy    IBS (irritable bowel syndrome)    Internal hemorrhoids    Sepsis (HCC) 11/2020   UTI (urinary tract infection)    Past Surgical History:  Procedure Laterality Date   BRAIN SURGERY  2003   decompression   BREAST EXCISIONAL BIOPSY Bilateral    age 78's   CATARACT EXTRACTION     DILATION AND CURETTAGE OF UTERUS     EYE SURGERY     OVARIAN CYST REMOVAL Bilateral    REDUCTION MAMMAPLASTY Bilateral    25+ years ago   Family History  Problem Relation Age of Onset   Arthritis Mother    Hypertension Mother    Kidney disease Mother    Diabetes Mother    Alcohol abuse Father    Arthritis Father    Lung cancer Father  Hypertension Sister    Rheum arthritis Sister    Diabetes Maternal Grandmother    Breast cancer Paternal Grandmother    Hypertension Daughter    Parkinson's disease Son    Diabetes Maternal Aunt    Kidney failure Maternal Aunt    Diabetes Maternal Uncle    Breast cancer Paternal Aunt    Breast cancer Paternal Aunt    Colon cancer Neg Hx    Stomach cancer Neg Hx    Esophageal cancer Neg Hx    Review of Systems  Constitutional: Negative.   HENT: Negative.    Eyes: Negative.   Respiratory:  Negative for cough, chest tightness and shortness of breath.   Cardiovascular:  Negative for chest pain, palpitations and leg swelling.  Gastrointestinal:  Negative for abdominal distention, abdominal pain, constipation, diarrhea, nausea and vomiting.  Musculoskeletal:   Positive for arthralgias.  Skin: Negative.   Neurological: Negative.   Psychiatric/Behavioral: Negative.      Objective:  Physical Exam Constitutional:      Appearance: She is well-developed.  HENT:     Head: Normocephalic and atraumatic.  Cardiovascular:     Rate and Rhythm: Normal rate and regular rhythm.  Pulmonary:     Effort: Pulmonary effort is normal. No respiratory distress.     Breath sounds: Normal breath sounds. No wheezing or rales.  Abdominal:     General: Bowel sounds are normal. There is no distension.     Palpations: Abdomen is soft.     Tenderness: There is no abdominal tenderness. There is no rebound.  Musculoskeletal:        General: Tenderness present.     Cervical back: Normal range of motion.  Skin:    General: Skin is warm and dry.  Neurological:     Mental Status: She is alert and oriented to person, place, and time.     Coordination: Coordination normal.     Vitals:   01/27/23 0924  BP: 138/80  Pulse: 89  Temp: 98 F (36.7 C)  TempSrc: Oral  SpO2: 98%  Weight: 209 lb (94.8 kg)  Height: 5\' 6"  (1.676 m)    Assessment & Plan:

## 2023-01-27 NOTE — Assessment & Plan Note (Signed)
Uses temazepam 15 mg at bedtime and refilled today.

## 2023-01-27 NOTE — Assessment & Plan Note (Signed)
BP at goal on amlodipine 10 mg daily and hydrochlorothiazide 25 mg daily. Checking CMP and CBC and lipid panel and adjust as needed.

## 2023-01-27 NOTE — Assessment & Plan Note (Signed)
Overall stable changes and needs early initiation antibiotics due to risk of CNS spread.

## 2023-01-27 NOTE — Assessment & Plan Note (Signed)
Checking CMP.  

## 2023-01-27 NOTE — Assessment & Plan Note (Signed)
Refilled meclizine 12.5 mg prn which she uses rarely.

## 2023-01-27 NOTE — Assessment & Plan Note (Signed)
Flu shot yearly. Pneumonia complete. Shingrix due at pharmacy. Tetanus due at pharmacy. Colonoscopy due 2031. Mammogram due 2025, pap smear aged out and dexa complete. Counseled about sun safety and mole surveillance. Counseled about the dangers of distracted driving. Given 10 year screening recommendations.

## 2023-01-27 NOTE — Assessment & Plan Note (Signed)
Uses temazepam 15 mg at bedtime prn and doing well. Refilled today for #60 with 3 refills. Usually fills this about every 2-3 months.

## 2023-01-29 ENCOUNTER — Ambulatory Visit: Payer: PPO | Admitting: Podiatry

## 2023-01-29 DIAGNOSIS — R52 Pain, unspecified: Secondary | ICD-10-CM

## 2023-01-29 DIAGNOSIS — M19072 Primary osteoarthritis, left ankle and foot: Secondary | ICD-10-CM | POA: Diagnosis not present

## 2023-01-29 MED ORDER — BETAMETHASONE SOD PHOS & ACET 6 (3-3) MG/ML IJ SUSP
3.0000 mg | Freq: Once | INTRAMUSCULAR | Status: AC
Start: 2023-01-29 — End: 2023-01-29
  Administered 2023-01-29: 3 mg via INTRA_ARTICULAR

## 2023-01-29 NOTE — Progress Notes (Signed)
Chief Complaint  Patient presents with   Foot Pain    Left foot, pointing to the 5th metatarsal base. It feel like a pinching. No injuries she reports. It is feeling a little better today. No treatment. The surgery part is great.     HPI: 77 y.o. female presenting today for new complaint of pain and tenderness associated to the left midfoot.  History of bunion and midfoot exostectomy surgery left.  She says the surgical areas are doing well.  Over the last 2 months she has been experiencing pain and tenderness associated to the lateral aspect of the left midfoot.  She also has right knee arthritis and she feels that she is compensating and applying extra pressure on her left lower extremity.  Past Medical History:  Diagnosis Date   Anxiety    Aortic atherosclerosis (HCC)    Arnold-Chiari malformation (HCC)    Arthritis    Cirrhosis (HCC)    Colon polyps    Depression    Diverticulitis    Diverticulosis    Emphysema of lung (HCC) 12/02/2020   per CT scan   Headache    Hepatitis C    Hiatal hernia    Hypertension    Hypertensive retinopathy    IBS (irritable bowel syndrome)    Internal hemorrhoids    Sepsis (HCC) 11/2020   UTI (urinary tract infection)     Past Surgical History:  Procedure Laterality Date   BRAIN SURGERY  2003   decompression   BREAST EXCISIONAL BIOPSY Bilateral    age 40's   CATARACT EXTRACTION     DILATION AND CURETTAGE OF UTERUS     EYE SURGERY     OVARIAN CYST REMOVAL Bilateral    REDUCTION MAMMAPLASTY Bilateral    25+ years ago    Allergies  Allergen Reactions   Crab (Diagnostic) Hives   Morphine And Codeine Nausea And Vomiting   Sulfa Antibiotics Nausea And Vomiting     Physical Exam: General: The patient is alert and oriented x3 in no acute distress.  Dermatology: Skin is warm, dry and supple bilateral lower extremities.   Vascular: Palpable pedal pulses bilaterally. Capillary refill within normal limits.  No appreciable edema.  No  erythema.  Neurological: Grossly intact via light touch  Musculoskeletal Exam: No pedal deformities noted.  Tenderness with palpation noted around the fourth and fifth TMT  Radiographic Exam LT foot 01/29/2023:  Prior history of bunion surgery with 2 orthopedic screws within the distal portion of the first metatarsal which appear stable.  Advanced severe degenerative changes noted throughout the TMT joint and midtarsal joints.  Impression: Advanced severe DJD/arthritis left midfoot  Assessment/Plan of Care: 1.  Arthritis/DJD left midfoot  -Patient evaluated.  X-rays reviewed -Injection of 0.5 cc Celestone Soluspan injected around the fourth and fifth TMT of the left foot -Advise against going barefoot.  Recommend good supportive tennis shoes and sneakers -Return to clinic as needed       Felecia Shelling, DPM [facility]  Dr. Felecia Shelling, DPM    [address] [place], [place] [address]                Office [phone]  Fax [phone]

## 2023-01-30 ENCOUNTER — Telehealth: Payer: Self-pay | Admitting: Pharmacy Technician

## 2023-01-30 ENCOUNTER — Ambulatory Visit (HOSPITAL_BASED_OUTPATIENT_CLINIC_OR_DEPARTMENT_OTHER): Payer: Self-pay | Admitting: Physical Therapy

## 2023-01-30 ENCOUNTER — Other Ambulatory Visit (HOSPITAL_COMMUNITY): Payer: Self-pay

## 2023-01-30 NOTE — Telephone Encounter (Signed)
Pharmacy Patient Advocate Encounter   Received notification from CoverMyMeds that prior authorization for Ondansetron HCl 4MG  tablets is required/requested.   Insurance verification completed.   The patient is insured through [employer]/ [employer] .   Per test claim: PA required; PA submitted to [employer]/ [employer] via CoverMyMeds Key/confirmation #/EOC [id number] Status is pending

## 2023-01-31 ENCOUNTER — Ambulatory Visit (HOSPITAL_BASED_OUTPATIENT_CLINIC_OR_DEPARTMENT_OTHER): Payer: PPO | Admitting: Physical Therapy

## 2023-02-03 ENCOUNTER — Other Ambulatory Visit (HOSPITAL_COMMUNITY): Payer: Self-pay

## 2023-02-03 ENCOUNTER — Ambulatory Visit: Payer: PPO | Admitting: Internal Medicine

## 2023-02-03 NOTE — Progress Notes (Deleted)
Angela Ramirez D.[facility] [address] [place] [address] Phone: [phone]   Assessment and Plan:     There are no diagnoses linked to this encounter.  ***   Pertinent previous records reviewed include ***   Follow Up: ***     Subjective:   I,  , am serving as a Neurosurgeon for Doctor Richardean Sale   Chief Complaint: bilateral knee pain    HPI:    05/21/2022 Patient is a 77 year old female complaining of bilateral knee pain. patient states that she is having pain in her thighs when she walks , notes weakness in he knee when she stands, been going on for a month , was taking tramadol , hx of gastric problem , is taking meloxicam every other day , is becoming more active , thinks this might be post sepsis pain , is losing weight    07/02/2022 Patient states that she is better ,not going back to PT    01/14/2023 Patient states right knee is swollen , she did a lot of house work this week, when she increases activity she has more pain flares. Enjoys water therapy   02/04/2023 Patient states    Relevant Historical Information: Hep C with liver fibrosis, GERD, diverticulosis, hypertension  Additional pertinent review of systems negative.   Current Outpatient Medications:    Acetaminophen-Caffeine 500-65 MG TABS, Take 1 tablet by mouth every 6 (six) hours as needed (headache). Tension relief, Disp: , Rfl:    amLODipine (NORVASC) 10 MG tablet, Take 1 tablet (10 mg total) by mouth daily., Disp: 90 tablet, Rfl: 3   hydrochlorothiazide (HYDRODIURIL) 25 MG tablet, Take 1 tablet (25 mg total) by mouth daily., Disp: 90 tablet, Rfl: 3   levalbuterol (XOPENEX HFA) 45 MCG/ACT inhaler, Inhale 1-2 puffs into the lungs every 6 (six) hours as needed for wheezing or shortness of breath., Disp: 15 g, Rfl: 0   lidocaine (XYLOCAINE) 2 % solution, Use as directed 15 mLs in the mouth or throat every 6 (six) hours as needed for mouth pain.,  Disp: 100 mL, Rfl: 0   linaclotide (LINZESS) 72 MCG capsule, Take 1 capsule (72 mcg total) by mouth daily before breakfast., Disp: 90 capsule, Rfl: 3   meclizine (ANTIVERT) 12.5 MG tablet, Take 1 tablet (12.5 mg total) by mouth 3 (three) times daily as needed for dizziness., Disp: 30 tablet, Rfl: 0   meloxicam (MOBIC) 15 MG tablet, Take 1 tablet (15 mg total) by mouth daily., Disp: 30 tablet, Rfl: 3   ondansetron (ZOFRAN) 4 MG tablet, Take 1 tablet (4 mg total) by mouth every 8 (eight) hours as needed for nausea or vomiting., Disp: 20 tablet, Rfl: 3   pantoprazole (PROTONIX) 40 MG tablet, Take 1 tablet (40 mg total) by mouth daily. Take 30-60 minutes before eating, Disp: 90 tablet, Rfl: 5   temazepam (RESTORIL) 15 MG capsule, Take 1 capsule (15 mg total) by mouth at bedtime as needed for sleep., Disp: 60 capsule, Rfl: 3   tirzepatide (ZEPBOUND) 2.5 MG/0.5ML Pen, Inject 2.5 mg into the skin once a week., Disp: 2 mL, Rfl: 0   tirzepatide (ZEPBOUND) 5 MG/0.5ML Pen, Inject 5 mg into the skin once a week., Disp: 2 mL, Rfl: 0   tirzepatide (ZEPBOUND) 7.5 MG/0.5ML Pen, Inject 7.5 mg into the skin once a week., Disp: 2 mL, Rfl: 0   tiZANidine (ZANAFLEX) 2 MG tablet, TAKE 1 TABLET(2 MG) BY MOUTH EVERY 8 HOURS AS NEEDED  FOR MUSCLE SPASMS, Disp: 270 tablet, Rfl: 0   traMADol (ULTRAM) 50 MG tablet, Take 1 tablet (50 mg total) by mouth every 12 (twelve) hours as needed., Disp: 60 tablet, Rfl: 3   Objective:     There were no vitals filed for this visit.    There is no height or weight on file to calculate BMI.    Physical Exam:    ***   Electronically signed by:  Angela Ramirez D.[facility] 12:38 PM 02/03/23

## 2023-02-04 ENCOUNTER — Ambulatory Visit: Payer: PPO | Admitting: Sports Medicine

## 2023-02-05 ENCOUNTER — Ambulatory Visit (HOSPITAL_BASED_OUTPATIENT_CLINIC_OR_DEPARTMENT_OTHER): Payer: PPO | Attending: Sports Medicine | Admitting: Physical Therapy

## 2023-02-05 ENCOUNTER — Encounter (HOSPITAL_BASED_OUTPATIENT_CLINIC_OR_DEPARTMENT_OTHER): Payer: Self-pay | Admitting: Physical Therapy

## 2023-02-05 DIAGNOSIS — G8929 Other chronic pain: Secondary | ICD-10-CM | POA: Insufficient documentation

## 2023-02-05 DIAGNOSIS — M25562 Pain in left knee: Secondary | ICD-10-CM | POA: Diagnosis not present

## 2023-02-05 DIAGNOSIS — M6281 Muscle weakness (generalized): Secondary | ICD-10-CM | POA: Diagnosis not present

## 2023-02-05 DIAGNOSIS — M25561 Pain in right knee: Secondary | ICD-10-CM | POA: Diagnosis not present

## 2023-02-05 NOTE — Therapy (Signed)
OUTPATIENT PHYSICAL THERAPY LOWER EXTREMITY TREATMENT   Patient Name: Angela Ramirez MRN: [id number] DOB:[birth year], 77 y.o., female Today's Date: 02/05/2023  END OF SESSION:  PT End of Session - 02/05/23 0817     Visit Number 6    Number of Visits 8    Date for PT Re-Evaluation 02/21/23    PT Start Time 0815    PT Stop Time 0855    PT Time Calculation (min) 40 min    Activity Tolerance Patient tolerated treatment well    Behavior During Therapy [facility] for tasks assessed/performed               Past Medical History:  Diagnosis Date   Anxiety    Aortic atherosclerosis (HCC)    Arnold-Chiari malformation (HCC)    Arthritis    Cirrhosis (HCC)    Colon polyps    Depression    Diverticulitis    Diverticulosis    Emphysema of lung (HCC) 12/02/2020   per CT scan   Headache    Hepatitis C    Hiatal hernia    Hypertension    Hypertensive retinopathy    IBS (irritable bowel syndrome)    Internal hemorrhoids    Sepsis (HCC) 11/2020   UTI (urinary tract infection)    Past Surgical History:  Procedure Laterality Date   BRAIN SURGERY  2003   decompression   BREAST EXCISIONAL BIOPSY Bilateral    age 30's   CATARACT EXTRACTION     DILATION AND CURETTAGE OF UTERUS     EYE SURGERY     OVARIAN CYST REMOVAL Bilateral    REDUCTION MAMMAPLASTY Bilateral    25+ years ago   Patient Active Problem List   Diagnosis Date Noted   Obesity (BMI 30.0-34.9) 01/17/2023   Chronic pain of both knees 05/13/2022   Weakness acquired in ICU 01/26/2021   Left foot pain 07/14/2020   Situational anxiety 07/14/2020   Allergic rhinitis 02/11/2020   Vertigo 02/11/2020   Constipation 08/20/2019   GERD (gastroesophageal reflux disease) 02/19/2019   Low back pain 02/20/2018   Liver fibrosis 10/09/2017   Hep C w/o coma, chronic (HCC) 08/26/2017   Routine general medical examination at a health care facility 08/18/2017   Right shoulder pain 08/18/2017   Essential hypertension 09/21/2015    Arnold-Chiari malformation (HCC) 09/21/2015   Insomnia 09/21/2015   PTSD (post-traumatic stress disorder) 09/21/2015    PCP:   Myrlene Broker, MD    REFERRING PROVIDER: Richardean Sale, DO   REFERRING DIAG:  M25.561,M25.562,G89.29 (ICD-10-CM) - Chronic pain of both knees  M17.0 (ICD-10-CM) - Bilateral primary osteoarthritis of knee    THERAPY DIAG:  Chronic pain of both knees  Muscle weakness (generalized)  Chronic pain of right knee  Chronic pain of left knee  Rationale for Evaluation and Treatment: Rehabilitation  ONSET DATE: ~1 yr  SUBJECTIVE:   SUBJECTIVE STATEMENT: Pt reports she has been on Meloxicam for 4 days and it has helped the pain.  "I don't have the extreme tightness I did when I first started here".  She was busy with dr appts last week, and missed aquatic PT due to flat tires, but plans to return to her pool this week and will also try line dancing soon.   PERTINENT HISTORY: 1. Chronic pain of both knees 2. Bilateral primary osteoarthritis of knee -Chronic with exacerbation, subsequent visit - Overall decrease in flare of osteoarthritis with patient using Voltaren gel as needed, HEP - Patient tried physical therapy  and, however land-based PT significantly flared symptoms.  Patient request referral for aquatic therapy which she has benefited from in the past.  Referral sent today - May continue Voltaren gel topically as needed - Reviewed that long-term Tylenol use is not ideal with history of chronic hep C, long-term NSAID use is not ideal with history of GI pathology, IBS, GERD and episode of diverticulitis leading to sepsis.  Patient not currently interested in CSI due to history of weight gain with CSI, though could be considered at future office visit - May use turmeric and diet for general anti-inflammatory properties - Ambulatory referral to Physical Therapy  -Recommend stationary bike or elliptical or water therapy for activity PAIN:   Are you having pain?no: NPRS scale: 0/10 - Pain location:  Pain description:  Aggravating factors: grocery shopping/standing 6 hours Relieving factors: Aleve, tramdol, heat  PRECAUTIONS: Knee  WEIGHT BEARING RESTRICTIONS: No  FALLS:  Has patient fallen in last 6 months? No  LIVING ENVIRONMENT: Lives with: lives with their family   OCCUPATION: retired  PLOF: Independent  PATIENT GOALS: Get my mobility back, get back to gym, doing zumba    OBJECTIVE:   DIAGNOSTIC FINDINGS: none noted  PATIENT SURVEYS:  FOTO 43% with goal of 57% 01/17/23: 44%  COGNITION: Overall cognitive status: Within functional limits for tasks assessed     MUSCLE LENGTH: Hamstrings: Right 80 deg; Left 80 deg   POSTURE:  slight lateral  PALPATION: Mild patellar crepitus bilat   LOWER EXTREMITY ROM:  Active ROM Right eval Left eval  Hip flexion    Hip extension    Hip abduction    Hip adduction    Hip internal rotation    Hip external rotation    Knee flexion 125 130  Knee extension 0 0  Ankle dorsiflexion    Ankle plantarflexion    Ankle inversion    Ankle eversion     (Blank rows = not tested)  LOWER EXTREMITY MMT:  MMT Right eval Left eval Right / Left 01/17/23  Hip flexion 59.2 59.4 70.0 / 54  Hip extension     Hip abduction     Hip adduction 38.3 38.1 35.8 / 39.1  Hip internal rotation     Hip external rotation     Knee flexion 54.1 50.1 54.1 / 53.1  Knee extension     Ankle dorsiflexion     Ankle plantarflexion     Ankle inversion     Ankle eversion      (Blank rows = not tested)   FUNCTIONAL TESTS:  Timed up and go (TUG): 10.42 Berg Balance Scale: 45/56  01/17/23:  10.81   GAIT: Distance walked: 500 ft Assistive device utilized: None Level of assistance: Complete Independence Comments: shortened step length, quick pace   TODAY'S TREATMENT:  Pt seen for aquatic therapy today.  Treatment took place in water 3.5-4.75 ft in depth at the [facility] pool. Temp of water was 91.  Pt entered/exited the pool via stairs independently with bilat rail.  * at 4 ft without support:  walking forward and backward with reciprocal arm swing; * monster walk forward/ backward (cues for technique)  * side stepping with arm abdct/addct with rainbow -> yellow hand floats 4 laps total * farmer carry- single/bilat yellow hand floats under water at side for increased core engagement, marching forward/ backward * UE on yellow hand float: 3 way LE kick 2 sets of 5 ; leg swings into hip abdct/ addct (crossing midline) * staggered stance with kickboard row 2 x 10, varied speed * TrA set with noodle pull down to thighs * standing hamstring stretch with foot on 3rd step, 20 sec x 2 each LE * gastroc stretch with heel off of step, 20 sec each LE  Pt requires the buoyancy and hydrostatic pressure of water for support, and to offload joints by unweighting joint load by at least 50 % in navel deep water and by at least 75-80% in chest to neck deep water.  Viscosity of the water is needed for resistance of strengthening. Water current perturbations provides challenge to standing balance requiring increased core activation.     PATIENT EDUCATION:  Education details: exercise rationale / modification/ progression   Person educated: Patient Education method: Explanation Education comprehension: verbalized understanding  HOME EXERCISE PROGRAM: Access Code: [email or web address] URL: [email or web address] Date: 02/05/23 - issued this date Prepared by: [facility] [address] This aquatic home exercise program from [employer] utilizes pictures from land based exercises, but has been adapted prior to lamination and issuance.    ASSESSMENT:  CLINICAL IMPRESSION: Pt tolerated all exercises well without production of  pain.  Able to progress resistance to yellow hand floats (vs rainbow). HEP laminated, reviewed and issued.  Pt will continue to benefit from skilled PT aquatic intervention to continue to improve core and LE strengthening and balance with instruction on long term aquatic HEP.   Plan to progress as tolerated and prepare for d/c in 2 visits.    OBJECTIVE IMPAIRMENTS: pain.   ACTIVITY LIMITATIONS: carrying, lifting, and squatting  PARTICIPATION LIMITATIONS: community activity  PERSONAL FACTORS: Fitness and Past/current experiences are also affecting patient's functional outcome.   REHAB POTENTIAL: Good  CLINICAL DECISION MAKING: Evolving/moderate complexity  EVALUATION COMPLEXITY: Moderate   GOALS: Goals reviewed with patient? Yes  SHORT TERM GOALS: Target date: 02/21/23 Pt will tolerate full aquatic sessions consistently without increase in pain and with improving function to demonstrate good toleration and effectiveness of intervention.   Baseline: Goal status: INITIAL  2. Pt to improve Foto score to 53 Baseline: 43 Goal status: INITIAL  3.  Pt will be indep with final aquatic HEP for continued management of condition Baseline: none Goal status: INITIAL  4.  Pt will improve on Berg balance test to >/= 52/56 to demonstrate a decrease in fall risk. Baseline: 46/56 Goal status: INITIAL  5.  Pt will improve strength in hip abd by at least 5 lbs to demonstrate improved overall physical function Baseline: see chart Goal status: INITIAL    LONG TERM GOALS: Not approp at this time. Will set when/if necessary   PLAN:  PT FREQUENCY: 1 x week  PT DURATION: 5 weeks  PLANNED INTERVENTIONS: Therapeutic exercises, Therapeutic activity, Neuromuscular re-education, Balance training, Gait training, Patient/Family education, Self Care, Joint  mobilization, Joint manipulation, Stair training, Orthotic/Fit training, DME instructions, Aquatic Therapy, Dry Needling, Electrical stimulation,  Cryotherapy, Moist heat, Taping, Ionotophoresis 4mg /ml Dexamethasone, Manual therapy, and Re-evaluation  PLAN FOR NEXT SESSION:  Aquatic HEP for strengthening and stretching LE, core strengthening exercises; pain reduction OA knees.    Mayer Camel, PTA 02/05/23 1:32 PM [facility] [address] [place], [place], [address] Phone: [phone]   Fax:  [phone]

## 2023-02-05 NOTE — Telephone Encounter (Signed)
Pharmacy Patient Advocate Encounter  Received notification from [employer]/ [employer] that Prior Authorization for Ondansetron HCl 4MG  tablets has been DENIED. Please advise how you'd like to proceed. Full denial letter will be uploaded to the media tab. See denial reason below.   PA #/Case ID/Reference #: [id number]

## 2023-02-07 NOTE — Progress Notes (Unsigned)
Angela Ramirez D.[facility] [address] [place] [address] Phone: [phone]   Assessment and Plan:     There are no diagnoses linked to this encounter.  ***   Pertinent previous records reviewed include ***   Follow Up: ***     Subjective:   I, Angela Ramirez, am serving as a Neurosurgeon for Doctor Richardean Sale   Chief Complaint: bilateral knee pain    HPI:    05/21/2022 Patient is a 77 year old female complaining of bilateral knee pain. patient states that she is having pain in her thighs when she walks , notes weakness in he knee when she stands, been going on for a month , was taking tramadol , hx of gastric problem , is taking meloxicam every other day , is becoming more active , thinks this might be post sepsis pain , is losing weight    07/02/2022 Patient states that she is better ,not going back to PT    01/14/2023 Patient states right knee is swollen , she did a lot of house work this week, when she increases activity she has more pain flares. Enjoys water therapy   02/11/2023 Patient states    Relevant Historical Information: Hep C with liver fibrosis, GERD, diverticulosis, hypertension  Additional pertinent review of systems negative.   Current Outpatient Medications:    Acetaminophen-Caffeine 500-65 MG TABS, Take 1 tablet by mouth every 6 (six) hours as needed (headache). Tension relief, Disp: , Rfl:    amLODipine (NORVASC) 10 MG tablet, Take 1 tablet (10 mg total) by mouth daily., Disp: 90 tablet, Rfl: 3   hydrochlorothiazide (HYDRODIURIL) 25 MG tablet, Take 1 tablet (25 mg total) by mouth daily., Disp: 90 tablet, Rfl: 3   levalbuterol (XOPENEX HFA) 45 MCG/ACT inhaler, Inhale 1-2 puffs into the lungs every 6 (six) hours as needed for wheezing or shortness of breath., Disp: 15 g, Rfl: 0   lidocaine (XYLOCAINE) 2 % solution, Use as directed 15 mLs in the mouth or throat every 6 (six) hours as needed for mouth pain.,  Disp: 100 mL, Rfl: 0   linaclotide (LINZESS) 72 MCG capsule, Take 1 capsule (72 mcg total) by mouth daily before breakfast., Disp: 90 capsule, Rfl: 3   meclizine (ANTIVERT) 12.5 MG tablet, Take 1 tablet (12.5 mg total) by mouth 3 (three) times daily as needed for dizziness., Disp: 30 tablet, Rfl: 0   meloxicam (MOBIC) 15 MG tablet, Take 1 tablet (15 mg total) by mouth daily., Disp: 30 tablet, Rfl: 3   ondansetron (ZOFRAN) 4 MG tablet, Take 1 tablet (4 mg total) by mouth every 8 (eight) hours as needed for nausea or vomiting., Disp: 20 tablet, Rfl: 3   pantoprazole (PROTONIX) 40 MG tablet, Take 1 tablet (40 mg total) by mouth daily. Take 30-60 minutes before eating, Disp: 90 tablet, Rfl: 5   temazepam (RESTORIL) 15 MG capsule, Take 1 capsule (15 mg total) by mouth at bedtime as needed for sleep., Disp: 60 capsule, Rfl: 3   tirzepatide (ZEPBOUND) 2.5 MG/0.5ML Pen, Inject 2.5 mg into the skin once a week., Disp: 2 mL, Rfl: 0   tirzepatide (ZEPBOUND) 5 MG/0.5ML Pen, Inject 5 mg into the skin once a week., Disp: 2 mL, Rfl: 0   tirzepatide (ZEPBOUND) 7.5 MG/0.5ML Pen, Inject 7.5 mg into the skin once a week., Disp: 2 mL, Rfl: 0   tiZANidine (ZANAFLEX) 2 MG tablet, TAKE 1 TABLET(2 MG) BY MOUTH EVERY 8 HOURS AS NEEDED  FOR MUSCLE SPASMS, Disp: 270 tablet, Rfl: 0   traMADol (ULTRAM) 50 MG tablet, Take 1 tablet (50 mg total) by mouth every 12 (twelve) hours as needed., Disp: 60 tablet, Rfl: 3   Objective:     There were no vitals filed for this visit.    There is no height or weight on file to calculate BMI.    Physical Exam:    ***   Electronically signed by:  Angela Ramirez D.[facility] 7:13 AM 02/07/23

## 2023-02-11 ENCOUNTER — Ambulatory Visit: Payer: PPO | Admitting: Sports Medicine

## 2023-02-11 VITALS — BP 150/90 | Ht 66.0 in | Wt 208.0 lb

## 2023-02-11 DIAGNOSIS — M25562 Pain in left knee: Secondary | ICD-10-CM | POA: Diagnosis not present

## 2023-02-11 DIAGNOSIS — M25561 Pain in right knee: Secondary | ICD-10-CM

## 2023-02-11 DIAGNOSIS — M170 Bilateral primary osteoarthritis of knee: Secondary | ICD-10-CM

## 2023-02-11 DIAGNOSIS — G8929 Other chronic pain: Secondary | ICD-10-CM | POA: Diagnosis not present
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
## 2023-02-11 NOTE — Patient Instructions (Addendum)
Good to see you  Complete third week of Meloxicam Stop Meloxicam after that  Transition to tylenol Tylenol 500mg  2 times a day for pain relief  Follow up in as needed

## 2023-02-12 ENCOUNTER — Encounter (HOSPITAL_BASED_OUTPATIENT_CLINIC_OR_DEPARTMENT_OTHER): Payer: Self-pay | Admitting: Physical Therapy

## 2023-02-12 ENCOUNTER — Ambulatory Visit (HOSPITAL_BASED_OUTPATIENT_CLINIC_OR_DEPARTMENT_OTHER): Payer: PPO | Admitting: Physical Therapy

## 2023-02-12 DIAGNOSIS — M6281 Muscle weakness (generalized): Secondary | ICD-10-CM

## 2023-02-12 DIAGNOSIS — G8929 Other chronic pain: Secondary | ICD-10-CM

## 2023-02-12 DIAGNOSIS — M25561 Pain in right knee: Secondary | ICD-10-CM | POA: Diagnosis not present

## 2023-02-12 NOTE — Therapy (Signed)
OUTPATIENT PHYSICAL THERAPY LOWER EXTREMITY TREATMENT/DC PHYSICAL THERAPY DISCHARGE SUMMARY  Visits from Start of Care: 7  Current functional level related to goals / functional outcomes: Indep with all ADL's and functional mobiliy   Remaining deficits: Knee oa   Education / Equipment: Management of condition/ HEP   Patient agrees to discharge. Patient goals were met. Patient is being discharged due to meeting the stated rehab goals.   Patient Name: Angela Ramirez MRN: [id number] DOB:Jul 06, 1945, 77 y.o., female Today's Date: 02/12/2023  END OF SESSION:  PT End of Session - 02/12/23 0908     Visit Number 7    Number of Visits 8    Date for PT Re-Evaluation 02/21/23    PT Start Time 0904    PT Stop Time 0945    PT Time Calculation (min) 41 min    Activity Tolerance Patient tolerated treatment well    Behavior During Therapy [facility] for tasks assessed/performed               Past Medical History:  Diagnosis Date   Anxiety    Aortic atherosclerosis (HCC)    Arnold-Chiari malformation (HCC)    Arthritis    Cirrhosis (HCC)    Colon polyps    Depression    Diverticulitis    Diverticulosis    Emphysema of lung (HCC) 12/02/2020   per CT scan   Headache    Hepatitis C    Hiatal hernia    Hypertension    Hypertensive retinopathy    IBS (irritable bowel syndrome)    Internal hemorrhoids    Sepsis (HCC) 11/2020   UTI (urinary tract infection)    Past Surgical History:  Procedure Laterality Date   BRAIN SURGERY  2003   decompression   BREAST EXCISIONAL BIOPSY Bilateral    age 25's   CATARACT EXTRACTION     DILATION AND CURETTAGE OF UTERUS     EYE SURGERY     OVARIAN CYST REMOVAL Bilateral    REDUCTION MAMMAPLASTY Bilateral    25+ years ago   Patient Active Problem List   Diagnosis Date Noted   Obesity (BMI 30.0-34.9) 01/17/2023   Chronic pain of both knees 05/13/2022   Weakness acquired in ICU 01/26/2021   Left foot pain 07/14/2020   Situational  anxiety 07/14/2020   Allergic rhinitis 02/11/2020   Vertigo 02/11/2020   Constipation 08/20/2019   GERD (gastroesophageal reflux disease) 02/19/2019   Low back pain 02/20/2018   Liver fibrosis 10/09/2017   Hep C w/o coma, chronic (HCC) 08/26/2017   Routine general medical examination at a health care facility 08/18/2017   Right shoulder pain 08/18/2017   Essential hypertension 09/21/2015   Arnold-Chiari malformation (HCC) 09/21/2015   Insomnia 09/21/2015   PTSD (post-traumatic stress disorder) 09/21/2015    PCP:   Myrlene Broker, MD    REFERRING PROVIDER: Richardean Sale, DO   REFERRING DIAG:  M25.561,M25.562,G89.29 (ICD-10-CM) - Chronic pain of both knees  M17.0 (ICD-10-CM) - Bilateral primary osteoarthritis of knee    THERAPY DIAG:  Chronic pain of both knees  Muscle weakness (generalized)  Chronic pain of right knee  Chronic pain of left knee  Rationale for Evaluation and Treatment: Rehabilitation  ONSET DATE: ~1 yr  SUBJECTIVE:   SUBJECTIVE STATEMENT: Pt reports she has been on Meloxicam for 4 days and it has helped the pain.  "I don't have the extreme tightness I did when I first started here".  She was busy with dr appts last week, and  missed aquatic PT due to flat tires, but plans to return to her pool this week and will also try line dancing soon.   PERTINENT HISTORY: 1. Chronic pain of both knees 2. Bilateral primary osteoarthritis of knee -Chronic with exacerbation, subsequent visit - Overall decrease in flare of osteoarthritis with patient using Voltaren gel as needed, HEP - Patient tried physical therapy and, however land-based PT significantly flared symptoms.  Patient request referral for aquatic therapy which she has benefited from in the past.  Referral sent today - May continue Voltaren gel topically as needed - Reviewed that long-term Tylenol use is not ideal with history of chronic hep C, long-term NSAID use is not ideal with history of  GI pathology, IBS, GERD and episode of diverticulitis leading to sepsis.  Patient not currently interested in CSI due to history of weight gain with CSI, though could be considered at future office visit - May use turmeric and diet for general anti-inflammatory properties - Ambulatory referral to Physical Therapy  -Recommend stationary bike or elliptical or water therapy for activity PAIN:  Are you having pain?no: NPRS scale: 0/10 - Pain location:  Pain description:  Aggravating factors: grocery shopping/standing 6 hours Relieving factors: Aleve, tramdol, heat  PRECAUTIONS: Knee  WEIGHT BEARING RESTRICTIONS: No  FALLS:  Has patient fallen in last 6 months? No  LIVING ENVIRONMENT: Lives with: lives with their family   OCCUPATION: retired  PLOF: Independent  PATIENT GOALS: Get my mobility back, get back to gym, doing zumba    OBJECTIVE:   DIAGNOSTIC FINDINGS: none noted  PATIENT SURVEYS:  FOTO 43% with goal of 57% 01/17/23: 44% 02/12/23 70%  COGNITION: Overall cognitive status: Within functional limits for tasks assessed     MUSCLE LENGTH: Hamstrings: Right 80 deg; Left 80 deg   POSTURE:  slight lateral  PALPATION: Mild patellar crepitus bilat   LOWER EXTREMITY ROM:  Active ROM Right eval Left eval  Hip flexion    Hip extension    Hip abduction    Hip adduction    Hip internal rotation    Hip external rotation    Knee flexion 125 130  Knee extension 0 0  Ankle dorsiflexion    Ankle plantarflexion    Ankle inversion    Ankle eversion     (Blank rows = not tested)  LOWER EXTREMITY MMT:  MMT Right eval Left eval Right / Left 01/17/23 Right / Left  Hip flexion 59.2 59.4 70.0 / 54 69.6 / 62.3  Hip extension      Hip abduction      Hip adduction 38.3 38.1 35.8 / 39.1 44.1 / 45  Hip internal rotation      Hip external rotation      Knee flexion 54.1 50.1 54.1 / 53.1 Pt declined  Knee extension      Ankle dorsiflexion      Ankle plantarflexion       Ankle inversion      Ankle eversion       (Blank rows = not tested)   FUNCTIONAL TESTS:  Timed up and go (TUG): 10.42 Berg Balance Scale: 45/56  01/17/23:  10.81  02/12/23: Berg= 54/56   GAIT: Distance walked: 500 ft Assistive device utilized: None Level of assistance: Complete Independence Comments: shortened step length, quick pace   TODAY'S TREATMENT:  Pt seen for aquatic therapy today.  Treatment took place in water 3.5-4.75 ft in depth at the [facility] pool. Temp of water was 91.  Pt entered/exited the pool via stairs independently with bilat rail.  Exercises - Forward and Backward Walking with Hand Floats - Side Stepping with Hand Floats   - Walking March   - Single Leg Stance  - Staggered Stance Row with Kick Board   - Squat with single hand float  - Leg Swings Side to Side - hold wall or hand floats   - Forward Backward Leg Swing - hold wall or noodle   - Seated Straddle on Noodle Breast Stroke Arms and Bicycle Legs Forward  - Standing Hamstring Stretch With Foot on Stairs in Pool   - Theatre manager with Chair and Counter Support   - KB press @ 45d angle L and right for core rotator isometric sets  Pt requires the buoyancy and hydrostatic pressure of water for support, and to offload joints by unweighting joint load by at least 50 % in navel deep water and by at least 75-80% in chest to neck deep water.  Viscosity of the water is needed for resistance of strengthening. Water current perturbations provides challenge to standing balance requiring increased core activation.  Functional testing completed   PATIENT EDUCATION:  Education details: exercise rationale / modification/ progression   Person educated: Patient Education method: Explanation Education comprehension: verbalized understanding  HOME EXERCISE  PROGRAM: Access Code: [email or web address] URL: [email or web address] Date: 02/05/23 - issued this date Prepared by: [facility] This aquatic home exercise program from [employer] utilizes pictures from land based exercises, but has been adapted prior to lamination and issuance.    ASSESSMENT:  CLINICAL IMPRESSION: Pt reports no pain x 5 days.  Feels she is doing well and does not need to continue.  Reveiewed HEP.  Added core rotator isometrics. She has met all goals/ exceeded a few.  Pt DC from services.   OBJECTIVE IMPAIRMENTS: pain.   ACTIVITY LIMITATIONS: carrying, lifting, and squatting  PARTICIPATION LIMITATIONS: community activity  PERSONAL FACTORS: Fitness and Past/current experiences are also affecting patient's functional outcome.   REHAB POTENTIAL: Good  CLINICAL DECISION MAKING: Evolving/moderate complexity  EVALUATION COMPLEXITY: Moderate   GOALS: Goals reviewed with patient? Yes  SHORT TERM GOALS: Target date: 02/21/23 Pt will tolerate full aquatic sessions consistently without increase in pain and with improving function to demonstrate good toleration and effectiveness of intervention.   Baseline: Goal status: Met 02/12/23  2. Pt to improve Foto score to 53 Baseline: 43 Goal status: exceeded 70% 02/12/23  3.  Pt will be indep with final aquatic HEP for continued management of condition Baseline: none Goal status: Met 02/12/23  4.  Pt will improve on Berg balance test to >/= 52/56 to demonstrate a decrease in fall risk. Baseline: 46/56 Goal status: met/exceeded 54/56 02/12/23  5.  Pt will improve strength in hip abd by at least 5 lbs to demonstrate improved overall physical function Baseline: see chart Goal status: Met 02/12/23    LONG TERM GOALS: Not approp at this time. Will set when/if necessary   PLAN:  PT FREQUENCY: 1 x week  PT DURATION: 5 weeks  PLANNED INTERVENTIONS: Therapeutic exercises, Therapeutic  activity, Neuromuscular re-education, Balance training, Gait training, Patient/Family education, Self Care, Joint mobilization, Joint manipulation, Stair training, Orthotic/Fit training, DME instructions, Aquatic Therapy, Dry Needling, Electrical stimulation, Cryotherapy, Moist heat, Taping, Ionotophoresis 4mg /ml Dexamethasone, Manual therapy, and Re-evaluation  PLAN FOR NEXT SESSION:  Aquatic HEP for strengthening and stretching LE, core strengthening exercises; pain reduction OA knees.    Corrie Dandy [place]) Starleen Trussell MPT 02/12/23 9:39 AM [facility] [address] [place], [place], [address] Phone: [phone]   Fax:  [phone]

## 2023-02-17 ENCOUNTER — Ambulatory Visit (INDEPENDENT_AMBULATORY_CARE_PROVIDER_SITE_OTHER): Payer: PPO | Admitting: Ophthalmology

## 2023-02-17 ENCOUNTER — Encounter (INDEPENDENT_AMBULATORY_CARE_PROVIDER_SITE_OTHER): Payer: Self-pay | Admitting: Ophthalmology

## 2023-02-17 DIAGNOSIS — H353221 Exudative age-related macular degeneration, left eye, with active choroidal neovascularization: Secondary | ICD-10-CM | POA: Diagnosis not present

## 2023-02-17 DIAGNOSIS — H442E3 Degenerative myopia with other maculopathy, bilateral eye: Secondary | ICD-10-CM

## 2023-02-17 DIAGNOSIS — Z961 Presence of intraocular lens: Secondary | ICD-10-CM

## 2023-02-17 DIAGNOSIS — H532 Diplopia: Secondary | ICD-10-CM

## 2023-02-17 DIAGNOSIS — H35033 Hypertensive retinopathy, bilateral: Secondary | ICD-10-CM | POA: Diagnosis not present

## 2023-02-17 DIAGNOSIS — H43813 Vitreous degeneration, bilateral: Secondary | ICD-10-CM

## 2023-02-17 DIAGNOSIS — I10 Essential (primary) hypertension: Secondary | ICD-10-CM | POA: Diagnosis not present

## 2023-02-17 DIAGNOSIS — H4312 Vitreous hemorrhage, left eye: Secondary | ICD-10-CM | POA: Diagnosis not present

## 2023-02-17 DIAGNOSIS — H5213 Myopia, bilateral: Secondary | ICD-10-CM

## 2023-02-17 MED ORDER — AFLIBERCEPT 2MG/0.05ML IZ SOLN FOR KALEIDOSCOPE
2.0000 mg | INTRAVITREAL | Status: AC | PRN
Start: 2023-02-17 — End: 2023-02-17
  Administered 2023-02-17: 2 mg via INTRAVITREAL

## 2023-02-20 ENCOUNTER — Ambulatory Visit (HOSPITAL_BASED_OUTPATIENT_CLINIC_OR_DEPARTMENT_OTHER): Payer: PPO | Admitting: Physical Therapy

## 2023-02-27 ENCOUNTER — Encounter: Payer: Self-pay | Admitting: Internal Medicine

## 2023-02-27 ENCOUNTER — Ambulatory Visit (INDEPENDENT_AMBULATORY_CARE_PROVIDER_SITE_OTHER): Payer: PPO | Admitting: Internal Medicine

## 2023-02-27 VITALS — BP 150/80 | HR 76 | Temp 97.8°F | Ht 66.0 in | Wt 206.0 lb

## 2023-02-27 DIAGNOSIS — Z23 Encounter for immunization: Secondary | ICD-10-CM | POA: Diagnosis not present

## 2023-02-27 DIAGNOSIS — H109 Unspecified conjunctivitis: Secondary | ICD-10-CM | POA: Insufficient documentation

## 2023-02-27 MED ORDER — CIPROFLOXACIN HCL 0.3 % OP SOLN: OPHTHALMIC | 0 refills | Status: DC

## 2023-02-27 NOTE — Progress Notes (Signed)
   Subjective:   Patient ID: Angela Ramirez, female    DOB: 1946/05/01, 77 y.o.   MRN: [id number]  Eye Problem  Associated symptoms include an eye discharge and eye redness. Pertinent negatives include no nausea or vomiting.   The patient is a 77 YO female coming in for eye drainage. Pus leaking out and redness both eyes.   Review of Systems  Constitutional: Negative.   HENT: Negative.    Eyes:  Positive for discharge and redness.  Respiratory:  Negative for cough, chest tightness and shortness of breath.   Cardiovascular:  Negative for chest pain, palpitations and leg swelling.  Gastrointestinal:  Negative for abdominal distention, abdominal pain, constipation, diarrhea, nausea and vomiting.  Musculoskeletal: Negative.   Skin: Negative.   Neurological: Negative.   Psychiatric/Behavioral: Negative.      Objective:  Physical Exam Constitutional:      Appearance: She is well-developed.  HENT:     Head: Normocephalic and atraumatic.  Eyes:     General:        Right eye: Discharge present.        Left eye: Discharge present.    Comments: Redness both eyes  Cardiovascular:     Rate and Rhythm: Normal rate and regular rhythm.  Pulmonary:     Effort: Pulmonary effort is normal. No respiratory distress.     Breath sounds: Normal breath sounds. No wheezing or rales.  Abdominal:     General: Bowel sounds are normal. There is no distension.     Palpations: Abdomen is soft.     Tenderness: There is no abdominal tenderness. There is no rebound.  Musculoskeletal:     Cervical back: Normal range of motion.  Skin:    General: Skin is warm and dry.  Neurological:     Mental Status: She is alert and oriented to person, place, and time.     Coordination: Coordination normal.     Vitals:   02/27/23 0938 02/27/23 0939  BP: (!) 150/80 (!) 150/80  Pulse: 76   Temp: 97.8 F (36.6 C)   TempSrc: Oral   SpO2: 96%   Weight: 206 lb (93.4 kg)   Height: 5\' 6"  (1.676 m)      Assessment & Plan:  Flu shot given at visit

## 2023-02-27 NOTE — Assessment & Plan Note (Signed)
Rx cipro eye drops to use for both eyes for bacterial conjunctivitis.

## 2023-02-27 NOTE — Patient Instructions (Signed)
For the eye drops: Administer 1 drop, every 2 hours, while awake, for 2 days. Then 1 drop, every 4 hours, while awake, for the next 5 days.

## 2023-03-03 ENCOUNTER — Other Ambulatory Visit: Payer: Self-pay | Admitting: Internal Medicine
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
## 2023-03-19 ENCOUNTER — Ambulatory Visit (INDEPENDENT_AMBULATORY_CARE_PROVIDER_SITE_OTHER): Payer: PPO | Admitting: Ophthalmology

## 2023-03-19 ENCOUNTER — Encounter (INDEPENDENT_AMBULATORY_CARE_PROVIDER_SITE_OTHER): Payer: Self-pay | Admitting: Ophthalmology

## 2023-03-19 DIAGNOSIS — H532 Diplopia: Secondary | ICD-10-CM | POA: Diagnosis not present

## 2023-03-19 DIAGNOSIS — H35033 Hypertensive retinopathy, bilateral: Secondary | ICD-10-CM | POA: Diagnosis not present

## 2023-03-19 DIAGNOSIS — I10 Essential (primary) hypertension: Secondary | ICD-10-CM

## 2023-03-19 DIAGNOSIS — Z961 Presence of intraocular lens: Secondary | ICD-10-CM

## 2023-03-19 DIAGNOSIS — H353221 Exudative age-related macular degeneration, left eye, with active choroidal neovascularization: Secondary | ICD-10-CM | POA: Diagnosis not present

## 2023-03-19 DIAGNOSIS — H43813 Vitreous degeneration, bilateral: Secondary | ICD-10-CM | POA: Diagnosis not present

## 2023-03-19 DIAGNOSIS — H5213 Myopia, bilateral: Secondary | ICD-10-CM

## 2023-03-19 DIAGNOSIS — H4312 Vitreous hemorrhage, left eye: Secondary | ICD-10-CM

## 2023-03-19 DIAGNOSIS — H442E3 Degenerative myopia with other maculopathy, bilateral eye: Secondary | ICD-10-CM

## 2023-03-19 MED ORDER — AFLIBERCEPT 2MG/0.05ML IZ SOLN FOR KALEIDOSCOPE
2.0000 mg | INTRAVITREAL | Status: AC | PRN
Start: 2023-03-19 — End: 2023-03-19
  Administered 2023-03-19: 2 mg via INTRAVITREAL
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
## 2023-04-23 ENCOUNTER — Ambulatory Visit (INDEPENDENT_AMBULATORY_CARE_PROVIDER_SITE_OTHER): Payer: PPO | Admitting: Ophthalmology

## 2023-04-23 ENCOUNTER — Encounter (INDEPENDENT_AMBULATORY_CARE_PROVIDER_SITE_OTHER): Payer: Self-pay | Admitting: Ophthalmology

## 2023-04-23 DIAGNOSIS — H442E3 Degenerative myopia with other maculopathy, bilateral eye: Secondary | ICD-10-CM | POA: Diagnosis not present

## 2023-04-23 DIAGNOSIS — H353221 Exudative age-related macular degeneration, left eye, with active choroidal neovascularization: Secondary | ICD-10-CM

## 2023-04-23 DIAGNOSIS — I10 Essential (primary) hypertension: Secondary | ICD-10-CM | POA: Diagnosis not present

## 2023-04-23 DIAGNOSIS — Z961 Presence of intraocular lens: Secondary | ICD-10-CM | POA: Diagnosis not present

## 2023-04-23 DIAGNOSIS — H43813 Vitreous degeneration, bilateral: Secondary | ICD-10-CM

## 2023-04-23 DIAGNOSIS — H4312 Vitreous hemorrhage, left eye: Secondary | ICD-10-CM

## 2023-04-23 DIAGNOSIS — H532 Diplopia: Secondary | ICD-10-CM | POA: Diagnosis not present

## 2023-04-23 DIAGNOSIS — H5213 Myopia, bilateral: Secondary | ICD-10-CM

## 2023-04-23 DIAGNOSIS — H35033 Hypertensive retinopathy, bilateral: Secondary | ICD-10-CM | POA: Diagnosis not present

## 2023-04-23 MED ORDER — AFLIBERCEPT 2MG/0.05ML IZ SOLN FOR KALEIDOSCOPE
2.0000 mg | INTRAVITREAL | Status: AC | PRN
Start: 2023-04-23 — End: 2023-04-23
  Administered 2023-04-23: 2 mg via INTRAVITREAL

## 2023-05-05 ENCOUNTER — Other Ambulatory Visit: Payer: Self-pay | Admitting: Internal Medicine

## 2023-05-05 NOTE — Telephone Encounter (Signed)
Should still have refill at pharmacy needs to call them
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
## 2023-06-04 ENCOUNTER — Encounter (INDEPENDENT_AMBULATORY_CARE_PROVIDER_SITE_OTHER): Payer: PPO | Admitting: Ophthalmology

## 2023-06-04 DIAGNOSIS — I10 Essential (primary) hypertension: Secondary | ICD-10-CM

## 2023-06-04 DIAGNOSIS — H4312 Vitreous hemorrhage, left eye: Secondary | ICD-10-CM

## 2023-06-04 DIAGNOSIS — H43813 Vitreous degeneration, bilateral: Secondary | ICD-10-CM

## 2023-06-04 DIAGNOSIS — Z961 Presence of intraocular lens: Secondary | ICD-10-CM

## 2023-06-04 DIAGNOSIS — H5213 Myopia, bilateral: Secondary | ICD-10-CM

## 2023-06-04 DIAGNOSIS — H532 Diplopia: Secondary | ICD-10-CM

## 2023-06-04 DIAGNOSIS — H442E3 Degenerative myopia with other maculopathy, bilateral eye: Secondary | ICD-10-CM

## 2023-06-04 DIAGNOSIS — H353221 Exudative age-related macular degeneration, left eye, with active choroidal neovascularization: Secondary | ICD-10-CM

## 2023-06-04 DIAGNOSIS — H35033 Hypertensive retinopathy, bilateral: Secondary | ICD-10-CM
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
## 2023-06-06 ENCOUNTER — Encounter (INDEPENDENT_AMBULATORY_CARE_PROVIDER_SITE_OTHER): Payer: Self-pay | Admitting: Ophthalmology

## 2023-06-06 ENCOUNTER — Ambulatory Visit (INDEPENDENT_AMBULATORY_CARE_PROVIDER_SITE_OTHER): Payer: PPO | Admitting: Ophthalmology

## 2023-06-06 DIAGNOSIS — Z961 Presence of intraocular lens: Secondary | ICD-10-CM

## 2023-06-06 DIAGNOSIS — H43813 Vitreous degeneration, bilateral: Secondary | ICD-10-CM | POA: Diagnosis not present

## 2023-06-06 DIAGNOSIS — I10 Essential (primary) hypertension: Secondary | ICD-10-CM

## 2023-06-06 DIAGNOSIS — H532 Diplopia: Secondary | ICD-10-CM | POA: Diagnosis not present

## 2023-06-06 DIAGNOSIS — H35033 Hypertensive retinopathy, bilateral: Secondary | ICD-10-CM | POA: Diagnosis not present

## 2023-06-06 DIAGNOSIS — H5213 Myopia, bilateral: Secondary | ICD-10-CM

## 2023-06-06 DIAGNOSIS — H442E3 Degenerative myopia with other maculopathy, bilateral eye: Secondary | ICD-10-CM

## 2023-06-06 DIAGNOSIS — H353221 Exudative age-related macular degeneration, left eye, with active choroidal neovascularization: Secondary | ICD-10-CM

## 2023-06-06 DIAGNOSIS — H4312 Vitreous hemorrhage, left eye: Secondary | ICD-10-CM

## 2023-06-06 MED ORDER — AFLIBERCEPT 2MG/0.05ML IZ SOLN FOR KALEIDOSCOPE
2.0000 mg | INTRAVITREAL | Status: AC | PRN
Start: 2023-06-06 — End: 2023-06-06
  Administered 2023-06-06: 2 mg via INTRAVITREAL

## 2023-06-13 ENCOUNTER — Emergency Department (HOSPITAL_BASED_OUTPATIENT_CLINIC_OR_DEPARTMENT_OTHER): Payer: PPO

## 2023-06-13 ENCOUNTER — Inpatient Hospital Stay (HOSPITAL_BASED_OUTPATIENT_CLINIC_OR_DEPARTMENT_OTHER)
Admission: EM | Admit: 2023-06-13 | Discharge: 2023-06-15 | DRG: 378 | Disposition: A | Discharge status: Home / Self Care | Payer: PPO | Attending: Internal Medicine | Admitting: Internal Medicine

## 2023-06-13 ENCOUNTER — Other Ambulatory Visit: Payer: Self-pay

## 2023-06-13 ENCOUNTER — Encounter (HOSPITAL_BASED_OUTPATIENT_CLINIC_OR_DEPARTMENT_OTHER): Payer: Self-pay

## 2023-06-13 DIAGNOSIS — Z885 Allergy status to narcotic agent status: Secondary | ICD-10-CM

## 2023-06-13 DIAGNOSIS — K7460 Unspecified cirrhosis of liver: Secondary | ICD-10-CM | POA: Diagnosis not present

## 2023-06-13 DIAGNOSIS — K922 Gastrointestinal hemorrhage, unspecified: Secondary | ICD-10-CM | POA: Diagnosis present

## 2023-06-13 DIAGNOSIS — M25562 Pain in left knee: Secondary | ICD-10-CM | POA: Diagnosis not present

## 2023-06-13 DIAGNOSIS — K2961 Other gastritis with bleeding: Secondary | ICD-10-CM | POA: Diagnosis not present

## 2023-06-13 DIAGNOSIS — K2960 Other gastritis without bleeding: Secondary | ICD-10-CM | POA: Diagnosis present

## 2023-06-13 DIAGNOSIS — D62 Acute posthemorrhagic anemia: Secondary | ICD-10-CM | POA: Diagnosis not present

## 2023-06-13 DIAGNOSIS — E669 Obesity, unspecified: Secondary | ICD-10-CM | POA: Diagnosis present

## 2023-06-13 DIAGNOSIS — K222 Esophageal obstruction: Secondary | ICD-10-CM | POA: Diagnosis present

## 2023-06-13 DIAGNOSIS — K625 Hemorrhage of anus and rectum: Principal | ICD-10-CM | POA: Insufficient documentation

## 2023-06-13 DIAGNOSIS — Y92009 Unspecified place in unspecified non-institutional (private) residence as the place of occurrence of the external cause: Secondary | ICD-10-CM | POA: Diagnosis not present

## 2023-06-13 DIAGNOSIS — Z820 Family history of epilepsy and other diseases of the nervous system: Secondary | ICD-10-CM

## 2023-06-13 DIAGNOSIS — Z803 Family history of malignant neoplasm of breast: Secondary | ICD-10-CM

## 2023-06-13 DIAGNOSIS — K5731 Diverticulosis of large intestine without perforation or abscess with bleeding: Secondary | ICD-10-CM | POA: Diagnosis not present

## 2023-06-13 DIAGNOSIS — Z860100 Personal history of colon polyps, unspecified: Secondary | ICD-10-CM | POA: Diagnosis not present

## 2023-06-13 DIAGNOSIS — Z791 Long term (current) use of non-steroidal anti-inflammatories (NSAID): Secondary | ICD-10-CM

## 2023-06-13 DIAGNOSIS — I701 Atherosclerosis of renal artery: Secondary | ICD-10-CM | POA: Diagnosis not present

## 2023-06-13 DIAGNOSIS — I739 Peripheral vascular disease, unspecified: Secondary | ICD-10-CM | POA: Diagnosis present

## 2023-06-13 DIAGNOSIS — Q0700 Arnold-Chiari syndrome without spina bifida or hydrocephalus: Secondary | ICD-10-CM | POA: Diagnosis not present

## 2023-06-13 DIAGNOSIS — J439 Emphysema, unspecified: Secondary | ICD-10-CM | POA: Diagnosis present

## 2023-06-13 DIAGNOSIS — F5104 Psychophysiologic insomnia: Secondary | ICD-10-CM

## 2023-06-13 DIAGNOSIS — K921 Melena: Secondary | ICD-10-CM

## 2023-06-13 DIAGNOSIS — F32A Depression, unspecified: Secondary | ICD-10-CM | POA: Diagnosis not present

## 2023-06-13 DIAGNOSIS — Z811 Family history of alcohol abuse and dependence: Secondary | ICD-10-CM

## 2023-06-13 DIAGNOSIS — E785 Hyperlipidemia, unspecified: Secondary | ICD-10-CM | POA: Diagnosis present

## 2023-06-13 DIAGNOSIS — Z6832 Body mass index (BMI) 32.0-32.9, adult: Secondary | ICD-10-CM | POA: Diagnosis not present

## 2023-06-13 DIAGNOSIS — N3001 Acute cystitis with hematuria: Secondary | ICD-10-CM | POA: Diagnosis not present

## 2023-06-13 DIAGNOSIS — G4700 Insomnia, unspecified: Secondary | ICD-10-CM | POA: Diagnosis not present

## 2023-06-13 DIAGNOSIS — T39395A Adverse effect of other nonsteroidal anti-inflammatory drugs [NSAID], initial encounter: Secondary | ICD-10-CM | POA: Diagnosis present

## 2023-06-13 DIAGNOSIS — Z833 Family history of diabetes mellitus: Secondary | ICD-10-CM

## 2023-06-13 DIAGNOSIS — K429 Umbilical hernia without obstruction or gangrene: Secondary | ICD-10-CM | POA: Diagnosis not present

## 2023-06-13 DIAGNOSIS — Z87891 Personal history of nicotine dependence: Secondary | ICD-10-CM

## 2023-06-13 DIAGNOSIS — Z7985 Long-term (current) use of injectable non-insulin antidiabetic drugs: Secondary | ICD-10-CM

## 2023-06-13 DIAGNOSIS — Z801 Family history of malignant neoplasm of trachea, bronchus and lung: Secondary | ICD-10-CM

## 2023-06-13 DIAGNOSIS — Z882 Allergy status to sulfonamides status: Secondary | ICD-10-CM | POA: Diagnosis not present

## 2023-06-13 DIAGNOSIS — Z8619 Personal history of other infectious and parasitic diseases: Secondary | ICD-10-CM | POA: Diagnosis not present

## 2023-06-13 DIAGNOSIS — K449 Diaphragmatic hernia without obstruction or gangrene: Secondary | ICD-10-CM | POA: Diagnosis present

## 2023-06-13 DIAGNOSIS — Z8249 Family history of ischemic heart disease and other diseases of the circulatory system: Secondary | ICD-10-CM

## 2023-06-13 DIAGNOSIS — F419 Anxiety disorder, unspecified: Secondary | ICD-10-CM | POA: Diagnosis present

## 2023-06-13 DIAGNOSIS — E66811 Obesity, class 1: Secondary | ICD-10-CM | POA: Diagnosis present

## 2023-06-13 DIAGNOSIS — N390 Urinary tract infection, site not specified: Secondary | ICD-10-CM | POA: Diagnosis not present

## 2023-06-13 DIAGNOSIS — M25561 Pain in right knee: Secondary | ICD-10-CM | POA: Diagnosis not present

## 2023-06-13 DIAGNOSIS — Z8711 Personal history of peptic ulcer disease: Secondary | ICD-10-CM

## 2023-06-13 DIAGNOSIS — G8929 Other chronic pain: Secondary | ICD-10-CM | POA: Diagnosis not present

## 2023-06-13 DIAGNOSIS — Z841 Family history of disorders of kidney and ureter: Secondary | ICD-10-CM

## 2023-06-13 DIAGNOSIS — M6289 Other specified disorders of muscle: Secondary | ICD-10-CM

## 2023-06-13 DIAGNOSIS — M170 Bilateral primary osteoarthritis of knee: Secondary | ICD-10-CM | POA: Diagnosis present

## 2023-06-13 DIAGNOSIS — K5730 Diverticulosis of large intestine without perforation or abscess without bleeding: Secondary | ICD-10-CM | POA: Diagnosis not present

## 2023-06-13 DIAGNOSIS — Z8261 Family history of arthritis: Secondary | ICD-10-CM

## 2023-06-13 DIAGNOSIS — Z79899 Other long term (current) drug therapy: Secondary | ICD-10-CM

## 2023-06-13 DIAGNOSIS — K2971 Gastritis, unspecified, with bleeding: Secondary | ICD-10-CM | POA: Diagnosis not present

## 2023-06-13 DIAGNOSIS — K2970 Gastritis, unspecified, without bleeding: Secondary | ICD-10-CM | POA: Diagnosis not present

## 2023-06-13 DIAGNOSIS — I10 Essential (primary) hypertension: Secondary | ICD-10-CM | POA: Diagnosis present

## 2023-06-13 DIAGNOSIS — K219 Gastro-esophageal reflux disease without esophagitis: Secondary | ICD-10-CM | POA: Diagnosis present

## 2023-06-13 LAB — URINALYSIS, ROUTINE W REFLEX MICROSCOPIC
Bilirubin Urine: NEGATIVE
Glucose, UA: NEGATIVE mg/dL
Nitrite: POSITIVE — AB
Specific Gravity, Urine: >1.046 — ABNORMAL HIGH (ref 1.005–1.030)
pH: 7 (ref 5.0–8.0)

## 2023-06-13 LAB — COMPREHENSIVE METABOLIC PANEL
ALT: 7 U/L (ref 0–44)
AST: 17 U/L (ref 15–41)
Albumin: 4.2 g/dL (ref 3.5–5.0)
Alkaline Phosphatase: 83 U/L (ref 38–126)
Anion gap: 7 (ref 5–15)
BUN: 21 mg/dL (ref 8–23)
CO2: 30 mmol/L (ref 22–32)
Calcium: 9.6 mg/dL (ref 8.9–10.3)
Chloride: 103 mmol/L (ref 98–111)
Creatinine, Ser: 0.85 mg/dL (ref 0.44–1.00)
GFR, Estimated: >60 mL/min (ref >60)
Glucose, Bld: 157 mg/dL — ABNORMAL HIGH (ref 70–99)
Potassium: 4 mmol/L (ref 3.5–5.1)
Sodium: 140 mmol/L (ref 135–145)
Total Bilirubin: 0.4 mg/dL (ref <1.2)
Total Protein: 7.3 g/dL (ref 6.5–8.1)

## 2023-06-13 LAB — CBC
HCT: 36.1 % (ref 36.0–46.0)
Hemoglobin: 11.7 g/dL — ABNORMAL LOW (ref 12.0–15.0)
MCH: 29.2 pg (ref 26.0–34.0)
MCHC: 32.4 g/dL (ref 30.0–36.0)
MCV: 90 fL (ref 80.0–100.0)
Platelets: 256 10*3/uL (ref 150–400)
RBC: 4.01 MIL/uL (ref 3.87–5.11)
RDW: 14.6 % (ref 11.5–15.5)
WBC: 9.5 10*3/uL (ref 4.0–10.5)
nRBC: 0 % (ref 0.0–0.2)

## 2023-06-13 LAB — TYPE AND SCREEN
ABO/RH(D): O POS
Antibody Screen: NEGATIVE

## 2023-06-13 LAB — HEMOGLOBIN AND HEMATOCRIT, BLOOD
HCT: 31.5 % — ABNORMAL LOW (ref 36.0–46.0)
HCT: 34 % — ABNORMAL LOW (ref 36.0–46.0)
Hemoglobin: 10.2 g/dL — ABNORMAL LOW (ref 12.0–15.0)
Hemoglobin: 11 g/dL — ABNORMAL LOW (ref 12.0–15.0)

## 2023-06-13 LAB — ABO/RH: ABO/RH(D): O POS

## 2023-06-13 LAB — PROTIME-INR
INR: 1 (ref 0.8–1.2)
Prothrombin Time: 13.2 s (ref 11.4–15.2)

## 2023-06-13 LAB — LIPASE, BLOOD: Lipase: 26 U/L (ref 11–51)

## 2023-06-13 MED ORDER — BUTALBITAL-APAP-CAFFEINE 50-325-40 MG PO TABS: 1.0000 | ORAL_TABLET | Freq: Four times a day (QID) | ORAL | Status: DC | PRN

## 2023-06-13 MED ORDER — ACETAMINOPHEN 325 MG PO TABS
650.0000 mg | ORAL_TABLET | Freq: Four times a day (QID) | ORAL | Status: DC | PRN
  Administered 2023-06-13: 650 mg via ORAL
  Filled 2023-06-13: qty 2

## 2023-06-13 MED ORDER — TIZANIDINE HCL 2 MG PO TABS: 2.0000 mg | ORAL_TABLET | Freq: Two times a day (BID) | ORAL | Status: DC | PRN

## 2023-06-13 MED ORDER — SODIUM CHLORIDE 0.9 % IV SOLN
2.0000 g | INTRAVENOUS | Status: DC
  Administered 2023-06-13 – 2023-06-14 (×2): 2 g via INTRAVENOUS
  Filled 2023-06-13 (×2): qty 20

## 2023-06-13 MED ORDER — TEMAZEPAM 7.5 MG PO CAPS
15.0000 mg | ORAL_CAPSULE | Freq: Every evening | ORAL | Status: DC | PRN
  Administered 2023-06-14 (×2): 15 mg via ORAL
  Filled 2023-06-13 (×2): qty 2

## 2023-06-13 MED ORDER — LEVALBUTEROL HCL 0.63 MG/3ML IN NEBU: 0.6300 mg | INHALATION_SOLUTION | Freq: Four times a day (QID) | RESPIRATORY_TRACT | Status: DC | PRN

## 2023-06-13 MED ORDER — TRAMADOL HCL 50 MG PO TABS
50.0000 mg | ORAL_TABLET | Freq: Two times a day (BID) | ORAL | Status: DC | PRN
  Administered 2023-06-14: 50 mg via ORAL
  Filled 2023-06-13: qty 1

## 2023-06-13 MED ORDER — LORATADINE 10 MG PO TABS
10.0000 mg | ORAL_TABLET | Freq: Every day | ORAL | Status: DC
  Administered 2023-06-14 – 2023-06-15 (×2): 10 mg via ORAL
  Filled 2023-06-13 (×2): qty 1

## 2023-06-13 MED ORDER — LACTATED RINGERS IV BOLUS
1000.0000 mL | Freq: Once | INTRAVENOUS | Status: AC
  Administered 2023-06-13: 1000 mL via INTRAVENOUS

## 2023-06-13 MED ORDER — HYDRALAZINE HCL 20 MG/ML IJ SOLN: 10.0000 mg | INTRAMUSCULAR | Status: DC | PRN

## 2023-06-13 MED ORDER — PANTOPRAZOLE SODIUM 40 MG PO TBEC
40.0000 mg | DELAYED_RELEASE_TABLET | Freq: Every day | ORAL | Status: DC
  Administered 2023-06-13 – 2023-06-14 (×2): 40 mg via ORAL
  Filled 2023-06-13 (×2): qty 1

## 2023-06-13 MED ORDER — SODIUM CHLORIDE 0.9 % IV SOLN: INTRAVENOUS | Status: DC

## 2023-06-13 MED ORDER — MECLIZINE HCL 12.5 MG PO TABS: 12.5000 mg | ORAL_TABLET | Freq: Three times a day (TID) | ORAL | Status: DC | PRN

## 2023-06-13 MED ORDER — ACETAMINOPHEN 650 MG RE SUPP: 650.0000 mg | Freq: Four times a day (QID) | RECTAL | Status: DC | PRN

## 2023-06-13 MED ORDER — HYDROCHLOROTHIAZIDE 25 MG PO TABS
25.0000 mg | ORAL_TABLET | Freq: Every day | ORAL | Status: DC
  Administered 2023-06-14 – 2023-06-15 (×2): 25 mg via ORAL
  Filled 2023-06-13 (×2): qty 1

## 2023-06-13 MED ORDER — ALBUTEROL SULFATE (2.5 MG/3ML) 0.083% IN NEBU: 2.5000 mg | INHALATION_SOLUTION | Freq: Four times a day (QID) | RESPIRATORY_TRACT | Status: DC | PRN

## 2023-06-13 MED ORDER — IOHEXOL 350 MG/ML SOLN
100.0000 mL | Freq: Once | INTRAVENOUS | Status: AC | PRN
  Administered 2023-06-13: 100 mL via INTRAVENOUS

## 2023-06-13 MED ORDER — FENTANYL CITRATE PF 50 MCG/ML IJ SOSY
50.0000 ug | PREFILLED_SYRINGE | Freq: Once | INTRAMUSCULAR | Status: AC
  Administered 2023-06-13: 50 ug via INTRAVENOUS
  Filled 2023-06-13: qty 1

## 2023-06-13 MED ORDER — LINACLOTIDE 72 MCG PO CAPS: 72.0000 ug | ORAL_CAPSULE | Freq: Every day | ORAL | Status: DC | PRN

## 2023-06-13 MED ORDER — ONDANSETRON HCL 4 MG/2ML IJ SOLN
4.0000 mg | Freq: Once | INTRAMUSCULAR | Status: AC
  Administered 2023-06-13: 4 mg via INTRAVENOUS
  Filled 2023-06-13: qty 2

## 2023-06-13 NOTE — Consult Note (Addendum)
Referring Provider: Dr. Madelyn Flavors  Primary Care Physician:  Myrlene Broker, MD Primary Gastroenterologist:  Dr. Erick Blinks   Reason for Consultation: Bright red bloody bowel movements  HPI: Angela Ramirez is a 77 y.o. female with a past medical history of anxiety, depression, hypertension, hyperlipidemia, chronic hepatitis C s/p treatment with SVR with compensated cirrhosis, constipation, GERD, hiatal hernia, IBS, polyps, hospitalization 11/2020 secondary to severe diverticulitis with sepsis complicated by acute hypoxic respiratory failure, acute metabolic encephalopathy and acute kidney injury. She went shopping yesterday and felt well most of the day. She ate chicken, rice and black beans from [employer] for dinner and around 9-9:30 PM she felt the urge to pass a bowel movement and on her way to the bathroom she soiled herself then passed a large amount of loose stool in the commode, at that point the bathroom lights were off and she did not look at the [facility] in the commode. She went to the sink to wash her undergarment and turned on the light then saw a moderate amount of bright red blood in the sink. She went back to bed then endorsed going to the bathroom 5 times before midnight, initially passed some stool with bright red blood and the last 1 or 2 episodes she passed only bright red blood with dark clots. No melena. She had mild nausea without vomiting. She noted having abdominal bloat without significant abdominal pain for the past 5 to 7 days. She contacted her son and she presented to [facility] ED for further evaluation.  Labs in the ED showed a WBC count of 9.5.  Hemoglobin 11.7 down from 12.8 four months ago.  Hematocrit 36.1.  Platelet 256.  BUN 21.  Creatinine 0.85.  Total bili 0.4.  Alk phos 83.  AST 17.  ALT 8.  Lipase 26.  INR 1.0.  CTA was negative for active GI bleeding and showed advanced sigmoid diverticulosis without evidence of diverticulitis and identified 50% right  renal artery stenosis.  Repeat hemoglobin 10.2.  No chest pain or shortness of breath but briefly felt dizzy in the ED.  She was transferred to [facility] for admission. A GI consult was requested for further evaluation regarding hematochezia.  She passed a small amount of darker red blood prior to departing from [facility] ED.  No further hematochezia since arriving to [facility]. She takes Meloxicam infrequently for knee pain, last dose was taken 2 days ago. Infrequent alcohol use, drinks a "fake" margarita once monthly.  She rarely has heartburn, no dysphagia.  She is not taking a PPI.  If she has heartburn, she drinks a few sips of water or pickle juice.  She has a history of chronic hepatitis C which was previously treated with compensated cirrhosis. Her most recent surveillance EGD was 03/11/2022 which showed mild reflux esophagitis, a 2 cm hiatal hernia and gastric ulcers.  Biopsies were negative for H. pylori or dysplasia.  Her most recent colonoscopy was 11/01/2019 which showed melanosis coli, severe diverticulosis in the sigmoid colon without polyps.  Prior history of colon polyps. Her prior GI care was in [place]. She has had 2 colonoscopies with the last being in 2012. She reports having 6 polyps removed on her initial colonoscopy in the second colonoscopy was "normal".  Zepbound is on her outpatient medication list but she never started this medication.   GI PROCEDURES:  EGD 03/11/2022: Mild acid reflux esophagitis with no bleeding. 2 cm hiatal hernia.  Normal  cardia, gastric fundus and gastric body. Biopsied.  Gastritis with ulcers. Biopsied. Mild duodenitis in bulb. Normal second portion of the duodenum. Surveillance EGD in 2 years 1. Surgical [P], gastric antrum FRAGMENTS OF GASTRIC MUCOSA WITH MINIMAL VASCULAR ECTASIA. H. PYLORI, INTESTINAL METAPLASIA, ATROPHY AND DYSPLASIA ARE NOT IDENTIFIED. 2. Surgical [P], gastric body FRAGMENTS OF GASTRIC MUCOSA WITH MINIMAL  VASCULAR ECTASIA. H. PYLORI, INTESTINAL METAPLASIA, ATROPHY AND DYSPLASIA ARE NOT IDENTIFIED.  EGD 11/01/2019: Normal mucosa was found in the entire esophagus. No evidence of esophageal or gastric varices.  2 cm hiatal hernia.  Gastritis. Biopsied.  Normal examined duodenum. Surgical [P], gastric antrum and gastric body - REACTIVE GASTROPATHY WITH EROSIONS. [place] IS NEGATIVE FOR HELICOBACTER PYLORI. - NO INTESTINAL METAPLASIA, DYSPLASIA, OR MALIGNANCY  Colonoscopy 11/01/2019: Melanosis in the colon.  Severe diverticulosis in the sigmoid colon.  Small internal hemorrhoids. No specimens collected. No further colonoscopies secondary to age and in the absence of no colon polyps  Past Medical History:  Diagnosis Date   Anxiety    Aortic atherosclerosis (HCC)    Arnold-Chiari malformation (HCC)    Arthritis    Cirrhosis (HCC)    Colon polyps    Depression    Diverticulitis    Diverticulosis    Emphysema of lung (HCC) 12/02/2020   per CT scan   Headache    Hepatitis C    Hiatal hernia    Hypertension    Hypertensive retinopathy    IBS (irritable bowel syndrome)    Internal hemorrhoids    Sepsis (HCC) 11/2020   UTI (urinary tract infection)     Past Surgical History:  Procedure Laterality Date   BRAIN SURGERY  2003   decompression   BREAST EXCISIONAL BIOPSY Bilateral    age 46's   CATARACT EXTRACTION     DILATION AND CURETTAGE OF UTERUS     EYE SURGERY     OVARIAN CYST REMOVAL Bilateral    REDUCTION MAMMAPLASTY Bilateral    25+ years ago    Prior to Admission medications   Medication Sig Start Date End Date Taking? Authorizing Provider  Acetaminophen-Caffeine 500-65 MG TABS Take 1 tablet by mouth every 6 (six) hours as needed (headache). Tension relief    [provider]  amLODipine (NORVASC) 10 MG tablet Take 1 tablet (10 mg total) by mouth daily. 01/27/23   Myrlene Broker, MD  ciprofloxacin (CILOXAN) 0.3 % ophthalmic solution Administer  1 drop, every 2 hours, while awake, for 2 days. Then 1 drop, every 4 hours, while awake, for the next 5 days. 02/27/23   Myrlene Broker, MD  hydrochlorothiazide (HYDRODIURIL) 25 MG tablet Take 1 tablet (25 mg total) by mouth daily. 01/27/23   Myrlene Broker, MD  levalbuterol [facility] HFA) 45 MCG/ACT inhaler Inhale 1-2 puffs into the lungs every 6 (six) hours as needed for wheezing or shortness of breath. 08/12/22   Gwenlyn Fudge, FNP  lidocaine (XYLOCAINE) 2 % solution Use as directed 15 mLs in the mouth or throat every 6 (six) hours as needed for mouth pain. 08/21/22   Corwin Levins, MD  linaclotide [facility]) 72 MCG capsule Take 1 capsule (72 mcg total) by mouth daily before breakfast. 01/27/23   Myrlene Broker, MD  meclizine (ANTIVERT) 12.5 MG tablet Take 1 tablet (12.5 mg total) by mouth 3 (three) times daily as needed for dizziness. 01/27/23   Myrlene Broker, MD  meloxicam (MOBIC) 15 MG tablet Take 1 tablet (15 mg total) by mouth daily.  01/27/23   Myrlene Broker, MD  ondansetron (ZOFRAN) 4 MG tablet Take 1 tablet (4 mg total) by mouth every 8 (eight) hours as needed for nausea or vomiting. 01/27/23   Myrlene Broker, MD  pantoprazole (PROTONIX) 40 MG tablet Take 1 tablet (40 mg total) by mouth daily. Take 30-60 minutes before eating 11/08/21   Pyrtle, [name], MD  temazepam (RESTORIL) 15 MG capsule Take 1 capsule (15 mg total) by mouth at bedtime as needed for sleep. 01/27/23   Myrlene Broker, MD  tirzepatide [facility]) 2.5 MG/0.5ML Pen Inject 2.5 mg into the skin once a week. 01/14/23   Myrlene Broker, MD  tirzepatide [facility]) 5 MG/0.5ML Pen Inject 5 mg into the skin once a week. 01/14/23   Myrlene Broker, MD  tirzepatide (ZEPBOUND) 7.5 MG/0.5ML Pen Inject 7.5 mg into the skin once a week. 01/14/23   Myrlene Broker, MD  tiZANidine (ZANAFLEX) 2 MG tablet TAKE 1 TABLET(2 MG) BY MOUTH EVERY 8 HOURS AS NEEDED FOR MUSCLE SPASMS 03/04/23   Myrlene Broker, MD  traMADol (ULTRAM) 50 MG tablet Take 1 tablet (50 mg total) by mouth every 12 (twelve) hours as needed. 01/24/23   Myrlene Broker, MD    No current facility-administered medications for this encounter.    Allergies as of 06/13/2023 - Review Complete 06/13/2023  Allergen Reaction Noted   Crab (diagnostic) Hives 12/02/2020   Morphine and codeine Nausea And Vomiting 09/21/2015   Sulfa antibiotics Nausea And Vomiting 09/21/2015    Family History  Problem Relation Age of Onset   Arthritis Mother    Hypertension Mother    Kidney disease Mother    Diabetes Mother    Alcohol abuse Father    Arthritis Father    Lung cancer Father    Hypertension Sister    Rheum arthritis Sister    Diabetes Maternal Grandmother    Breast cancer Paternal Grandmother    Hypertension Daughter    Parkinson's disease Son    Diabetes Maternal Aunt    Kidney failure Maternal Aunt    Diabetes Maternal Uncle    Breast cancer Paternal Aunt    Breast cancer Paternal Aunt    Colon cancer Neg Hx    Stomach cancer Neg Hx    Esophageal cancer Neg Hx     Social History   Socioeconomic History   Marital status: Widowed    Spouse name: Not on file   Number of children: 3   Years of education: Not on file   Highest education level: Not on file  Occupational History   Occupation: retired  Tobacco Use   Smoking status: Former    Current packs/day: 0.00    Types: Cigarettes    Quit date: 09/29/1969    Years since quitting: 53.7   Smokeless tobacco: Never  Vaping Use   Vaping status: Never Used  Substance and Sexual Activity   Alcohol use: Yes    Alcohol/week: 0.0 standard drinks of alcohol    Comment: 2 glasses of wine a week   Drug use: No   Sexual activity: Not Currently  Other Topics Concern   Not on file  Social History Narrative   ** Merged History Encounter **       Social Drivers of Corporate investment banker Strain: Not on file  Food Insecurity: Not on file   Transportation Needs: Not on file  Physical Activity: Not on file  Stress: Not on file  Social  Connections: Not on file  Intimate Partner Violence: Not on file    Review of Systems: Gen: Denies fever, sweats or chills. No unintentional weight loss.  CV: Denies chest pain, palpitations or edema. Resp: Denies cough, shortness of breath of hemoptysis.  GI: See HPI. GU : Denies urinary burning, blood in urine, increased urinary frequency or incontinence. MS: Denies joint pain, muscles aches or weakness. Derm: Denies rash, itchiness, skin lesions or unhealing ulcers. Psych: Denies depression, anxiety, memory loss or confusion. Heme: Denies easy bruising, bleeding. Neuro:  + Dizziness in the ED.  Endo:  Denies any problems with DM, thyroid or adrenal function.  Physical Exam: Vital signs in last 24 hours: Temp:  [98 F (36.7 C)-98.3 F (36.8 C)] 98.3 F (36.8 C) (12/20 0839) Pulse Rate:  [70-105] 80 (12/20 1200) Resp:  [11-24] 12 (12/20 1200) BP: (117-171)/(55-84) 168/70 (12/20 1200) SpO2:  [92 %-100 %] 100 % (12/20 1200) Weight:  [90.7 kg] 90.7 kg (12/20 0040)   General: Alert 77 year old female in no acute distress. Head:  Normocephalic and atraumatic. Eyes:  No scleral icterus. Conjunctiva pink. Ears:  Normal auditory acuity. Nose:  No deformity, discharge or lesions. Mouth: Upper dentures, lower partial plate.  No ulcers or lesions.  Neck:  Supple. No lymphadenopathy or thyromegaly.  Lungs: Breath sounds clear throughout. No wheezes, rhonchi or crackles.  Heart: Regular rate and rhythm, soft systolic murmur. Abdomen: Soft, nondistended.  Mild tenderness to the central abdomen halfway between the xiphoid process and umbilicus and mild tenderness to the RLQ and LLQ without rebound or guarding.  Positive bowel sounds to all 4 quadrants. Rectal: Noninflamed external hemorrhoids with a long rubbery hemorrhoid tag/papilla to the right external anal area, no anal fissures, dark red  blood on the examining gloved finger without stool or palpable mass within reach.  Patient also has a soft pea size cystic lesion to the right perineum.  RN at the bedside at time of exam. Musculoskeletal:  Symmetrical without gross deformities.  Pulses:  Normal pulses noted. Extremities: No edema. Neurologic:  Alert and  oriented x 4. No focal deficits.  Skin:  Intact without significant lesions or rashes. Psych:  Alert and cooperative. Normal mood and affect.  Intake/Output from previous day: 12/19 0701 - 12/20 0700 In: 1000 [IV Piggyback:1000] Out: -  Intake/Output this shift: No intake/output data recorded.  Lab Results: Recent Labs    06/13/23 0050 06/13/23 0449  WBC 9.5  --   HGB 11.7* 10.2*  HCT 36.1 31.5*  PLT 256  --    BMET Recent Labs    06/13/23 0050  NA 140  K 4.0  CL 103  CO2 30  GLUCOSE 157*  BUN 21  CREATININE 0.85  CALCIUM 9.6   LFT Recent Labs    06/13/23 0050  PROT 7.3  ALBUMIN 4.2  AST 17  ALT 7  ALKPHOS 83  BILITOT 0.4   PT/INR Recent Labs    06/13/23 0100  LABPROT 13.2  INR 1.0   Hepatitis Panel No results for input(s): "HEPBSAG", "HCVAB", "HEPAIGM", "HEPBIGM" in the last 72 hours.  MELD 3.0: 7 at 06/13/2023  1:00 AM MELD-Na: 6 at 06/13/2023  1:00 AM Calculated from: Serum Creatinine: 0.85 mg/dL (Using min of 1 mg/dL) at 40/98/1191 47:82 AM Serum Sodium: 140 mmol/L (Using max of 137 mmol/L) at 06/13/2023 12:50 AM Total Bilirubin: 0.4 mg/dL (Using min of 1 mg/dL) at 95/62/1308 65:78 AM Serum Albumin: 4.2 g/dL (Using max of 3.5 g/dL) at 46/96/2952 84:13  AM INR(ratio): 1 at 06/13/2023  1:00 AM Age at listing (hypothetical): 77 years Sex: Female at 06/13/2023  1:00 AM  Studies/Results: CT ANGIO GI BLEED Result Date: 06/13/2023 CLINICAL DATA:  Acute mesenteric ischemia, bright red blood per rectum EXAM: CTA ABDOMEN AND PELVIS WITHOUT AND WITH CONTRAST TECHNIQUE: Multidetector CT imaging of the abdomen and pelvis was performed  using the standard protocol during bolus administration of intravenous contrast. Multiplanar reconstructed images and MIPs were obtained and reviewed to evaluate the vascular anatomy. RADIATION DOSE REDUCTION: This exam was performed according to the departmental dose-optimization program which includes automated exposure control, adjustment of the mA and/or kV according to patient size and/or use of iterative reconstruction technique. CONTRAST:  OMNIPAQUE IOHEXOL 350 MG/ML SOLN COMPARISON:  05/30/2021 FINDINGS: VASCULAR Aorta: Normal caliber aorta without aneurysm, dissection, vasculitis or significant stenosis. Mild atherosclerotic calcification Celiac: Patent without evidence of aneurysm, dissection, vasculitis or significant stenosis. SMA: Patent without evidence of aneurysm, dissection, vasculitis or significant stenosis. Renals: Greater than 50% stenosis of the right renal artery at its origin. Left renal artery is widely patent. Normal vascular morphology. No aneurysm or dissection. IMA: Patent without evidence of aneurysm, dissection, vasculitis or significant stenosis. Inflow: Patent without evidence of aneurysm, dissection, vasculitis or significant stenosis. Internal iliac arteries are patent bilaterally Proximal Outflow: Bilateral common femoral and visualized portions of the superficial and profunda femoral arteries are patent without evidence of aneurysm, dissection, vasculitis or significant stenosis. Veins: There is made of a circumaortic left renal vein. The abdominal and pelvic venous vasculature is otherwise unremarkable. Review of the MIP images confirms the above findings. NON-VASCULAR Lower chest: No acute abnormality. Hepatobiliary: No focal liver abnormality is seen. No gallstones, gallbladder wall thickening, or biliary dilatation. Densely calcified lymph nodes within the biliary hilum are in keeping with old granulomatous disease. Pancreas: Unremarkable. No pancreatic ductal  dilatation or surrounding inflammatory changes. Spleen: Normal in size without focal abnormality. Adrenals/Urinary Tract: Adrenal glands are unremarkable. Kidneys are normal, without renal calculi, focal lesion, or hydronephrosis. Bladder is unremarkable. Stomach/Bowel: Advanced sigmoid diverticulosis. The stomach, small bowel, and large bowel are otherwise unremarkable there is no evidence of obstruction or focal inflammation. Appendix normal. No active gastrointestinal hemorrhage. No free intraperitoneal gas or fluid. Lymphatic: No pathologic adenopathy within the abdomen and pelvis. Reproductive: Uterus and bilateral adnexa are unremarkable. Other: Tiny fat containing umbilical hernia Musculoskeletal: Degenerative changes are seen within the lumbar spine with grade 1 anterolisthesis L5-S1. No acute bone abnormality. Lytic or blastic bone lesion. IMPRESSION: 1. No active gastrointestinal hemorrhage. 2. Greater than 50% stenosis of the right renal artery at its origin. 3. Advanced sigmoid diverticulosis without superimposed acute inflammatory change. Electronically Signed   By: Helyn Numbers M.D.   On: 06/13/2023 04:31    IMPRESSION/PLAN:  77 year old female with a history of severe sigmoid diverticulitis with sepsis 11/2020 admitted to the hospital with bright red hematochezia with blood clots and generalized abdominal bloat, no severe abdominal pain.  Hemoglobin 11.7 down from 12.8.  Repeat hemoglobin 10.2.  CTA showed significant sigmoid diverticulosis without diverticulitis and no evidence of active GI bleeding.  Suspect diverticular bleed.  Hemodynamically stable. -Clear liquid diet -IV fluids per the hospitalist -Await H&H results, H&H every 6 hours x 24 hours -Transfuse for hemoglobin less than 7 or as needed if symptomatic -Continue to monitor patient closely for active GI bleeding -No plans for endoscopic evaluation at this time, await further recommendations per Dr. Rhea Belton  Remote history of  colon polyps.  Colonoscopy 10/2019 showed severe diverticulosis in the sigmoid colon without colon polyps.  History of GERD gastric ulcers per EGD 02/2022, path report negative for H. pylori.  Infrequent Meloxicam use. -Pantoprazole 40 mg p.o. daily  History of Chronic hepatitis C s/p treatment with SVR and compensated cirrhosis.  MELD 3.0: 7 per labs 06/13/2023. CTA showed a normal liver.  EGD 02/2022 without esophageal or gastric varices. -Variceal surveillance EGD due 02/2024  Right renal artery stenosis 50% per CTA    [facility]  06/13/2023, 2:54PM

## 2023-06-13 NOTE — ED Provider Notes (Signed)
[place] EMERGENCY DEPARTMENT AT [facility] Provider Note   CSN: [id number] Arrival date & time: 06/13/23  0032     History  Chief Complaint  Patient presents with   Rectal Bleeding    Angela Ramirez is a 77 y.o. female.  77 year old female presents ER today with rectal bleeding.  Patient has no history of the same.  She had a colonoscopy year and half ago with diverticulosis but no other abnormalities.  This was Dr. Rhea Belton at [place].  Patient states that she started having upset stomach yesterday initially only had a little bit of blood but then subsequently progressively worsened throughout the day where she is having large amounts of clots, bright red blood and dark red blood as well.  Felt faint and lightheaded so presented here for further evaluation.  Lower abdominal discomfort but nothing severe.  Did have some constipation recently.  History of hemorrhoids.   Rectal Bleeding      Home Medications Prior to Admission medications   Medication Sig Start Date End Date Taking? Authorizing Provider  Acetaminophen-Caffeine 500-65 MG TABS Take 1 tablet by mouth every 6 (six) hours as needed (headache). Tension relief    [provider]  amLODipine (NORVASC) 10 MG tablet Take 1 tablet (10 mg total) by mouth daily. 01/27/23   Myrlene Broker, MD  ciprofloxacin (CILOXAN) 0.3 % ophthalmic solution Administer 1 drop, every 2 hours, while awake, for 2 days. Then 1 drop, every 4 hours, while awake, for the next 5 days. 02/27/23   Myrlene Broker, MD  hydrochlorothiazide (HYDRODIURIL) 25 MG tablet Take 1 tablet (25 mg total) by mouth daily. 01/27/23   Myrlene Broker, MD  levalbuterol [facility]) 45 MCG/ACT inhaler Inhale 1-2 puffs into the lungs every 6 (six) hours as needed for wheezing or shortness of breath. 08/12/22   Gwenlyn Fudge, FNP  lidocaine (XYLOCAINE) 2 % solution Use as directed 15 mLs in the mouth or throat every 6 (six) hours as needed  for mouth pain. 08/21/22   Corwin Levins, MD  linaclotide [facility]) 72 MCG capsule Take 1 capsule (72 mcg total) by mouth daily before breakfast. 01/27/23   Myrlene Broker, MD  meclizine (ANTIVERT) 12.5 MG tablet Take 1 tablet (12.5 mg total) by mouth 3 (three) times daily as needed for dizziness. 01/27/23   Myrlene Broker, MD  meloxicam (MOBIC) 15 MG tablet Take 1 tablet (15 mg total) by mouth daily. 01/27/23   Myrlene Broker, MD  ondansetron (ZOFRAN) 4 MG tablet Take 1 tablet (4 mg total) by mouth every 8 (eight) hours as needed for nausea or vomiting. 01/27/23   Myrlene Broker, MD  pantoprazole (PROTONIX) 40 MG tablet Take 1 tablet (40 mg total) by mouth daily. Take 30-60 minutes before eating 11/08/21   Pyrtle, [name], MD  temazepam (RESTORIL) 15 MG capsule Take 1 capsule (15 mg total) by mouth at bedtime as needed for sleep. 01/27/23   Myrlene Broker, MD  tirzepatide [facility]) 2.5 MG/0.5ML Pen Inject 2.5 mg into the skin once a week. 01/14/23   Myrlene Broker, MD  tirzepatide [facility]) 5 MG/0.5ML Pen Inject 5 mg into the skin once a week. 01/14/23   Myrlene Broker, MD  tirzepatide (ZEPBOUND) 7.5 MG/0.5ML Pen Inject 7.5 mg into the skin once a week. 01/14/23   Myrlene Broker, MD  tiZANidine (ZANAFLEX) 2 MG tablet TAKE 1 TABLET(2 MG) BY MOUTH EVERY 8 HOURS AS NEEDED FOR MUSCLE  SPASMS 03/04/23   Myrlene Broker, MD  traMADol (ULTRAM) 50 MG tablet Take 1 tablet (50 mg total) by mouth every 12 (twelve) hours as needed. 01/24/23   Myrlene Broker, MD      Allergies    Crab (diagnostic), Morphine and codeine, and Sulfa antibiotics    Review of Systems   Review of Systems  Gastrointestinal:  Positive for hematochezia.    Physical Exam Updated Vital Signs BP (!) 149/72   Pulse 85   Temp 98 F (36.7 C)   Resp 12   Ht 5\' 6"  (1.676 m)   Wt 90.7 kg   SpO2 94%   BMI 32.28 kg/m  Physical Exam Vitals and nursing note reviewed.   Constitutional:      Appearance: She is well-developed.  HENT:     Head: Normocephalic.     Comments: Old bruise on forehead from fall a week ago    Mouth/Throat:     Mouth: Mucous membranes are dry.  Cardiovascular:     Rate and Rhythm: Regular rhythm. Tachycardia present.  Pulmonary:     Effort: No respiratory distress.     Breath sounds: No stridor.  Abdominal:     General: There is no distension.  Musculoskeletal:     Cervical back: Normal range of motion.  Skin:    Coloration: Skin is pale.  Neurological:     General: No focal deficit present.     Mental Status: She is alert.     ED Results / Procedures / Treatments   Labs (all labs ordered are listed, but only abnormal results are displayed) Labs Reviewed  COMPREHENSIVE METABOLIC PANEL - Abnormal; Notable for the following components:      Result Value   Glucose, Bld 157 (*)    All other components within normal limits  CBC - Abnormal; Notable for the following components:   Hemoglobin 11.7 (*)    All other components within normal limits  LIPASE, BLOOD  PROTIME-INR  URINALYSIS, ROUTINE W REFLEX MICROSCOPIC  HEMOGLOBIN AND HEMATOCRIT, BLOOD    EKG None  Radiology CT ANGIO GI BLEED Result Date: 06/13/2023 CLINICAL DATA:  Acute mesenteric ischemia, bright red blood per rectum EXAM: CTA ABDOMEN AND PELVIS WITHOUT AND WITH CONTRAST TECHNIQUE: Multidetector CT imaging of the abdomen and pelvis was performed using the standard protocol during bolus administration of intravenous contrast. Multiplanar reconstructed images and MIPs were obtained and reviewed to evaluate the vascular anatomy. RADIATION DOSE REDUCTION: This exam was performed according to the departmental dose-optimization program which includes automated exposure control, adjustment of the mA and/or kV according to patient size and/or use of iterative reconstruction technique. CONTRAST:  OMNIPAQUE IOHEXOL 350 MG/ML SOLN COMPARISON:  05/30/2021  FINDINGS: VASCULAR Aorta: Normal caliber aorta without aneurysm, dissection, vasculitis or significant stenosis. Mild atherosclerotic calcification Celiac: Patent without evidence of aneurysm, dissection, vasculitis or significant stenosis. SMA: Patent without evidence of aneurysm, dissection, vasculitis or significant stenosis. Renals: Greater than 50% stenosis of the right renal artery at its origin. Left renal artery is widely patent. Normal vascular morphology. No aneurysm or dissection. IMA: Patent without evidence of aneurysm, dissection, vasculitis or significant stenosis. Inflow: Patent without evidence of aneurysm, dissection, vasculitis or significant stenosis. Internal iliac arteries are patent bilaterally Proximal Outflow: Bilateral common femoral and visualized portions of the superficial and profunda femoral arteries are patent without evidence of aneurysm, dissection, vasculitis or significant stenosis. Veins: There is made of a circumaortic left renal vein. The abdominal and pelvic venous  vasculature is otherwise unremarkable. Review of the MIP images confirms the above findings. NON-VASCULAR Lower chest: No acute abnormality. Hepatobiliary: No focal liver abnormality is seen. No gallstones, gallbladder wall thickening, or biliary dilatation. Densely calcified lymph nodes within the biliary hilum are in keeping with old granulomatous disease. Pancreas: Unremarkable. No pancreatic ductal dilatation or surrounding inflammatory changes. Spleen: Normal in size without focal abnormality. Adrenals/Urinary Tract: Adrenal glands are unremarkable. Kidneys are normal, without renal calculi, focal lesion, or hydronephrosis. Bladder is unremarkable. Stomach/Bowel: Advanced sigmoid diverticulosis. The stomach, small bowel, and large bowel are otherwise unremarkable there is no evidence of obstruction or focal inflammation. Appendix normal. No active gastrointestinal hemorrhage. No free intraperitoneal gas or  fluid. Lymphatic: No pathologic adenopathy within the abdomen and pelvis. Reproductive: Uterus and bilateral adnexa are unremarkable. Other: Tiny fat containing umbilical hernia Musculoskeletal: Degenerative changes are seen within the lumbar spine with grade 1 anterolisthesis L5-S1. No acute bone abnormality. Lytic or blastic bone lesion. IMPRESSION: 1. No active gastrointestinal hemorrhage. 2. Greater than 50% stenosis of the right renal artery at its origin. 3. Advanced sigmoid diverticulosis without superimposed acute inflammatory change. Electronically Signed   By: Helyn Numbers M.D.   On: 06/13/2023 04:31    Procedures Procedures    Medications Ordered in ED Medications  lactated ringers bolus 1,000 mL (1,000 mLs Intravenous New Bag/Given 06/13/23 0103)  iohexol (OMNIPAQUE) 350 MG/ML injection 100 mL (100 mLs Intravenous Contrast Given 06/13/23 0231)  ondansetron (ZOFRAN) injection 4 mg (4 mg Intravenous Given 06/13/23 0207)    ED Course/ Medical Decision Making/ A&P                                 Medical Decision Making Amount and/or Complexity of Data Reviewed Labs: ordered. Radiology: ordered.  Risk Prescription drug management. Decision regarding hospitalization.   Hemodynamically stable with GI bleed.  Sounds a pretty significant amount at home.  GI bleed angio done without any obvious area of extravasation.  Symptoms seem to have improved some but her hemoglobin has dropped.  Her heart rate is mildly high even with fluid resuscitation.  At this time I suspect the patient probably needs to be observed.  Consulted gastroenterology per institutional protocol for evaluation and comanagement in the morning.  Hospitalist consult placed.    Final Clinical Impression(s) / ED Diagnoses Final diagnoses:  None    Rx / DC Orders ED Discharge Orders     None         Advaith Lamarque, Barbara Cower, MD 06/14/23 [phone]

## 2023-06-13 NOTE — ED Notes (Signed)
Pt ambulated in hallway. Pt denies any dizziness, lightheadedness. Pt ambulated to the bathroom and denies having any bloody stools. Pt placed back into bed. No signs of distress noted.

## 2023-06-13 NOTE — ED Triage Notes (Signed)
Pt also having LLQ abd pain. Also has history of diverticulitis.

## 2023-06-13 NOTE — ED Notes (Signed)
Called [place] at [facility]  for transport 10:44

## 2023-06-13 NOTE — H&P (Addendum)
History and Physical    Patient: Angela Ramirez DOB: [birth year] DOA: 06/13/2023 DOS: the patient was seen and examined on 06/13/2023 PCP: Myrlene Broker, MD  Patient coming from: Transfer from [facility]  Chief Complaint:  Chief Complaint  Patient presents with   Rectal Bleeding   HPI: Angela Ramirez is a 77 y.o. female with medical history significant of hypertension, hyperlipidemia, chronic hepatitis C s/p treatment, anxiety, depression, who presents with acute onset of bloody bowel movements. The symptoms began the previous night after consuming a meal from [employer]. The patient reported having seven bowel movements between 9:30 PM and midnight, with bright red blood per rectum.  Bleeding seemed to be becoming significantly worse around midnight, prompting her to seek medical attention.  The patient denied experiencing abdominal pain during these episodes but reported feeling lightheaded and dizzy towards the end. She denied being on any blood thinners, aspirin, or NSAIDs, but mentioned taking meloxicam intermittently as needed for arthritis pain.  Over the last couple days she did report that she had been having urinary frequency.  The patient had a colonoscopy in 2021 which was noted to show diverticulosis and hemorrhoids without polyps. The most recent bowel movement was reported to contain blood clots, with the patient noting that it seemed to be mostly blood rather than stool. She denied having any recent fevers or chills but mentioned experiencing some shortness of breath the previous day.  In the emergency department patient was noted to be afebrile with vital signs relatively maintained.  Labs significant for hemoglobin 11.7 ->10.2.  CT angiogram GI bleeding study did not note any active GI bleeding and greater than 50% stenosis of right renal artery at its origin.  Urinalysis was positive for moderate leukocytes, positive nitrates, trace protein, specific  gravity greater than 1.046, rare bacteria, and 6-10 WBCs.  Patient had been given 1 L of saline IV fluids, Zofran IV, and fentanyl 50 mcg.  Review of Systems: As mentioned in the history of present illness. All other systems reviewed and are negative. Past Medical History:  Diagnosis Date   Anxiety    Aortic atherosclerosis (HCC)    Arnold-Chiari malformation (HCC)    Arthritis    Cirrhosis (HCC)    Colon polyps    Depression    Diverticulitis    Diverticulosis    Emphysema of lung (HCC) 12/02/2020   per CT scan   Headache    Hepatitis C    Hiatal hernia    Hypertension    Hypertensive retinopathy    IBS (irritable bowel syndrome)    Internal hemorrhoids    Sepsis (HCC) 11/2020   UTI (urinary tract infection)    Past Surgical History:  Procedure Laterality Date   BRAIN SURGERY  2003   decompression   BREAST EXCISIONAL BIOPSY Bilateral    age 84's   CATARACT EXTRACTION     DILATION AND CURETTAGE OF UTERUS     EYE SURGERY     OVARIAN CYST REMOVAL Bilateral    REDUCTION MAMMAPLASTY Bilateral    25+ years ago   Social History:  reports that she quit smoking about 53 years ago. Her smoking use included cigarettes. She has never used smokeless tobacco. She reports current alcohol use. She reports that she does not use drugs.  Allergies  Allergen Reactions   Crab (Diagnostic) Hives   Morphine And Codeine Nausea And Vomiting   Sulfa Antibiotics Nausea And Vomiting    Family History  Problem Relation Age of  Onset   Arthritis Mother    Hypertension Mother    Kidney disease Mother    Diabetes Mother    Alcohol abuse Father    Arthritis Father    Lung cancer Father    Hypertension Sister    Rheum arthritis Sister    Diabetes Maternal Grandmother    Breast cancer Paternal Grandmother    Hypertension Daughter    Parkinson's disease Son    Diabetes Maternal Aunt    Kidney failure Maternal Aunt    Diabetes Maternal Uncle    Breast cancer Paternal Aunt    Breast  cancer Paternal Aunt    Colon cancer Neg Hx    Stomach cancer Neg Hx    Esophageal cancer Neg Hx     Prior to Admission medications   Medication Sig Start Date End Date Taking? Authorizing Provider  Acetaminophen-Caffeine 500-65 MG TABS Take 1 tablet by mouth every 6 (six) hours as needed (headache). Tension relief    [provider]  amLODipine (NORVASC) 10 MG tablet Take 1 tablet (10 mg total) by mouth daily. 01/27/23   Myrlene Broker, MD  ciprofloxacin (CILOXAN) 0.3 % ophthalmic solution Administer 1 drop, every 2 hours, while awake, for 2 days. Then 1 drop, every 4 hours, while awake, for the next 5 days. 02/27/23   Myrlene Broker, MD  hydrochlorothiazide (HYDRODIURIL) 25 MG tablet Take 1 tablet (25 mg total) by mouth daily. 01/27/23   Myrlene Broker, MD  levalbuterol [facility] HFA) 45 MCG/ACT inhaler Inhale 1-2 puffs into the lungs every 6 (six) hours as needed for wheezing or shortness of breath. 08/12/22   Gwenlyn Fudge, FNP  lidocaine (XYLOCAINE) 2 % solution Use as directed 15 mLs in the mouth or throat every 6 (six) hours as needed for mouth pain. 08/21/22   Corwin Levins, MD  linaclotide [facility]) 72 MCG capsule Take 1 capsule (72 mcg total) by mouth daily before breakfast. 01/27/23   Myrlene Broker, MD  meclizine (ANTIVERT) 12.5 MG tablet Take 1 tablet (12.5 mg total) by mouth 3 (three) times daily as needed for dizziness. 01/27/23   Myrlene Broker, MD  meloxicam (MOBIC) 15 MG tablet Take 1 tablet (15 mg total) by mouth daily. 01/27/23   Myrlene Broker, MD  ondansetron (ZOFRAN) 4 MG tablet Take 1 tablet (4 mg total) by mouth every 8 (eight) hours as needed for nausea or vomiting. 01/27/23   Myrlene Broker, MD  pantoprazole (PROTONIX) 40 MG tablet Take 1 tablet (40 mg total) by mouth daily. Take 30-60 minutes before eating 11/08/21   Pyrtle, [name], MD  temazepam (RESTORIL) 15 MG capsule Take 1 capsule (15 mg total) by mouth at bedtime as needed  for sleep. 01/27/23   Myrlene Broker, MD  tirzepatide [facility]) 2.5 MG/0.5ML Pen Inject 2.5 mg into the skin once a week. 01/14/23   Myrlene Broker, MD  tirzepatide [facility]) 5 MG/0.5ML Pen Inject 5 mg into the skin once a week. 01/14/23   Myrlene Broker, MD  tirzepatide (ZEPBOUND) 7.5 MG/0.5ML Pen Inject 7.5 mg into the skin once a week. 01/14/23   Myrlene Broker, MD  tiZANidine (ZANAFLEX) 2 MG tablet TAKE 1 TABLET(2 MG) BY MOUTH EVERY 8 HOURS AS NEEDED FOR MUSCLE SPASMS 03/04/23   Myrlene Broker, MD  traMADol (ULTRAM) 50 MG tablet Take 1 tablet (50 mg total) by mouth every 12 (twelve) hours as needed. 01/24/23   Myrlene Broker, MD  Physical Exam: Vitals:   06/13/23 0700 06/13/23 0839 06/13/23 1200 06/13/23 1300  BP: (!) 118/55  (!) 168/70 (!) 162/77  Pulse: 87  80 82  Resp: 13  12 14   Temp:  98.3 F (36.8 C)    TempSrc:  Oral    SpO2: 93%  100% 98%  Weight:      Height:       Constitutional: Elderly female currently no acute distress Eyes: PERRL, lids and conjunctivae normal ENMT: Mucous membranes are moist. Posterior pharynx clear of any exudate or lesions.  Neck: normal, supple Respiratory: clear to auscultation bilaterally, no wheezing, no crackles. Normal respiratory effort. No accessory muscle use.  Cardiovascular: Regular rate and rhythm, no murmurs / rubs / gallops. No extremity edema. 2+ pedal pulses.   Abdomen: no tenderness, no masses palpated.  Bowel sounds positive.  Musculoskeletal: no clubbing / cyanosis. No joint deformity upper and lower extremities. Good ROM, no contractures. Normal muscle tone.  Skin: no rashes, lesions, ulcers. No induration Neurologic: CN 2-12 grossly intact. Sensation intact, DTR normal. Strength 5/5 in all 4.  Psychiatric: Normal judgment and insight. Alert and oriented x 3. Normal mood.   Data Reviewed:  Reviewed labs, imaging, and pertinent records as documented  Assessment and Plan:   Acute blood  loss anemia secondary to GI bleed Patient presents with complaints of rectal bleeding.  Hemoglobin previously noted to be 12.8 when checked on 01/27/2023.  Hemoglobin noted to drop from 11.7 down to 10.2.  Last colonoscopy back in 2021 by Dr. Rhea Belton noted multiple small and large mouth diverticular found in the sigmoid colon and internal hemorrhoids. -Admit to a medical telemetry bed -Type and screen for possible need of blood products -Serial monitoring of H&H -Transfuse blood products as needed for hemoglobin less than 7 g/dL or symptomatic -Appreciate [facility] consultative services we will follow-up  Urinary tract infection Present on admission.  Patient reports having urinary frequency over the last couple days.  Urinalysis positive for moderate leukocytes, positive nitrates, trace protein, specific gravity greater than 1.046, rare bacteria, and 6-10 WBCs. -Check urine culture -Rocephin  IV  Essential hypertension Blood pressures elevated up to 171/76.  Patient reports that she is no longer taking amlodipine but continues to take hydrochlorothiazide 25 mg daily.  Last fill dates reported not to match. -Continue hydrochlorothiazide -Hydralazine IV as needed for elevated blood pressures  Chronic pain secondary to osteoarthritis of the bilateral knees -Recommended holding meloxicam due to GI bleed -Continue tramadol as needed  Renal artery stenosis CT angiogram bleeding study noted greater than 50% stenosis of right renal artery at its origin.  Chronic hepatitis C Patient with history of hepatitis C s/p treatment with sustained virologic response and compensated cirrhosis   Insomnia -Continue temazepam nightly  GERD -Protonix  Obesity BMI 32.28 kg/m  DVT prophylaxis: SCDs Advance Care Planning:   Code Status: Full Code    Consults: [facility]  Family Communication: Son updated at bedside  Severity of Illness: The appropriate patient status for this patient is  INPATIENT. Inpatient status is judged to be reasonable and necessary in order to provide the required intensity of service to ensure the patient's safety. The patient's presenting symptoms, physical exam findings, and initial radiographic and laboratory data in the context of their chronic comorbidities is felt to place them at high risk for further clinical deterioration. Furthermore, it is not anticipated that the patient will be medically stable for discharge from the hospital within 2 midnights of admission.   *  I certify that at the point of admission it is my clinical judgment that the patient will require inpatient hospital care spanning beyond 2 midnights from the point of admission due to high intensity of service, high risk for further deterioration and high frequency of surveillance required.*  Author: Clydie Braun, MD 06/13/2023 1:38 PM  For on call review [email or web address].

## 2023-06-13 NOTE — ED Triage Notes (Signed)
Pt having bm with bright red blood. Pt feels light headed. Started at 2130 tonight and has had 7 bloody Bms with clots. Pt does have internal hemorrhoids.Pt is not on blood thinner.

## 2023-06-13 NOTE — ED Notes (Signed)
Attempted to call report to IP RN, was told to call back in 5 minutes.

## 2023-06-13 NOTE — ED Notes (Signed)
Pt ambulated to bathroom with standby assist. Pt denies dizziness. No additional bleeding noted

## 2023-06-13 NOTE — ED Notes (Signed)
Pt c/o headache x 3 hours, states she thought it would go away but has had no relief. 7/10 pain scale. Denies nausea/other s/s

## 2023-06-13 NOTE — ED Notes (Signed)
Pt ambulated w/ assist to restroom, reported BM w/ blood clots. RN observed few small apparent blood clots in toilet. Upon return to bed, pt states she felt a little dizzy after walking. Vitals monitored, pt in NAD.

## 2023-06-14 DIAGNOSIS — K921 Melena: Secondary | ICD-10-CM | POA: Diagnosis not present

## 2023-06-14 DIAGNOSIS — K922 Gastrointestinal hemorrhage, unspecified: Secondary | ICD-10-CM | POA: Diagnosis not present

## 2023-06-14 DIAGNOSIS — D62 Acute posthemorrhagic anemia: Secondary | ICD-10-CM | POA: Diagnosis not present

## 2023-06-14 LAB — BASIC METABOLIC PANEL
Anion gap: 7 (ref 5–15)
BUN: 9 mg/dL (ref 8–23)
CO2: 27 mmol/L (ref 22–32)
Calcium: 8.6 mg/dL — ABNORMAL LOW (ref 8.9–10.3)
Chloride: 107 mmol/L (ref 98–111)
Creatinine, Ser: 0.87 mg/dL (ref 0.44–1.00)
GFR, Estimated: >60 mL/min (ref >60)
Glucose, Bld: 95 mg/dL (ref 70–99)
Potassium: 3.5 mmol/L (ref 3.5–5.1)
Sodium: 141 mmol/L (ref 135–145)

## 2023-06-14 LAB — CBC
HCT: 29.7 % — ABNORMAL LOW (ref 36.0–46.0)
HCT: 30.8 % — ABNORMAL LOW (ref 36.0–46.0)
Hemoglobin: 9.6 g/dL — ABNORMAL LOW (ref 12.0–15.0)
Hemoglobin: 10.2 g/dL — ABNORMAL LOW (ref 12.0–15.0)
MCH: 28.7 pg (ref 26.0–34.0)
MCH: 29.6 pg (ref 26.0–34.0)
MCHC: 32.3 g/dL (ref 30.0–36.0)
MCHC: 33.1 g/dL (ref 30.0–36.0)
MCV: 88.7 fL (ref 80.0–100.0)
MCV: 89.3 fL (ref 80.0–100.0)
Platelets: 211 10*3/uL (ref 150–400)
Platelets: 209 10*3/uL (ref 150–400)
RBC: 3.35 MIL/uL — ABNORMAL LOW (ref 3.87–5.11)
RBC: 3.45 MIL/uL — ABNORMAL LOW (ref 3.87–5.11)
RDW: 14.6 % (ref 11.5–15.5)
RDW: 14.6 % (ref 11.5–15.5)
WBC: 6.5 10*3/uL (ref 4.0–10.5)
WBC: 7.8 10*3/uL (ref 4.0–10.5)
nRBC: 0 % (ref 0.0–0.2)
nRBC: 0 % (ref 0.0–0.2)

## 2023-06-14 MED ORDER — PANTOPRAZOLE SODIUM 40 MG IV SOLR
40.0000 mg | Freq: Two times a day (BID) | INTRAVENOUS | Status: DC
  Administered 2023-06-14: 40 mg via INTRAVENOUS
  Filled 2023-06-14: qty 10

## 2023-06-14 NOTE — Hospital Course (Signed)
77yo with h/o HTN, HLD, chronic Hepatitis C s/p treatment, and depression/anxiety who presented on 12/20 with rectal bleeding.  CTA negative.  Hgb 11.7 -> 10.2.  GI consulted.  Also noted to have UTI, treated with Ceftriaxone.

## 2023-06-14 NOTE — Plan of Care (Signed)
  Problem: Nutrition: Goal: Adequate nutrition will be maintained Outcome: Progressing   Problem: Elimination: Goal: Will not experience complications related to bowel motility Outcome: Progressing   Problem: Pain Management: Goal: General experience of comfort will improve Outcome: Progressing   Problem: Safety: Goal: Ability to remain free from injury will improve Outcome: Progressing

## 2023-06-14 NOTE — Progress Notes (Signed)
Progress Note   Patient: Angela Ramirez WUX:[id number] DOB: [birth year] DOA: 06/13/2023     1 DOS: the patient was seen and examined on 06/14/2023   Brief hospital course: 77yo with h/o HTN, HLD, chronic Hepatitis C s/p treatment, and depression/anxiety who presented on 12/20 with rectal bleeding.  CTA negative.  Hgb 11.7 -> 10.2.  GI consulted.  Also noted to have UTI, treated with Ceftriaxone.  Assessment and Plan:  Lower GI Bleeding Patient presents with complaints of rectal bleeding Hemoglobin 12.8 -> 9.6   Last colonoscopy in 2021 with multiple diverticuli and internal hemorrhoids GI has consulted Likely diverticular bleeding, which appears to have abated but she is now passing tarry stools - plan for EGD tomorrow Continue to monitor overnight If still improving tomorrow, she may be appropriate for discharge after EGD  ABLA Hgb 12.8 -> 9.6 in the setting of GI bleeding Back up to 10.2 now Transfuse for Hgb <7  Urinary tract infection Patient reports having urinary frequency and urgency without dysuria over the last couple days UA with large Hgb, trace ketones, moderate LE, + nitrite, trace protein, rare bacteria - appears to be c/w UTI Continue Rocephin  IV while awaiting culture   Essential hypertension Blood pressures elevated up to 171/76 Patient reports that she is no longer taking amlodipine but continues to take hydrochlorothiazide 25 mg daily.  Last fill dates reported not to match. Continue hydrochlorothiazide Hydralazine IV as needed for elevated blood pressures   Chronic pain secondary to osteoarthritis of the bilateral knees Recommended holding meloxicam due to GI bleed Continue tramadol as needed   Renal artery stenosis CT angiogram bleeding study noted greater than 50% stenosis of right renal artery at its origin.   Chronic hepatitis C Patient with history of hepatitis C s/p treatment with sustained virologic response and compensated cirrhosis     Insomnia Continue temazepam nightly   GERD Continue Protonix, changed to IV BID   Class 1 Obesity Body mass index is 32.28 kg/m.Marland Kitchen  Weight loss should be encouraged Outpatient PCP/bariatric medicine f/u encouraged      Consultants: GI  Procedures: EGD 12/22  Antibiotics: Ceftriaxone 12/20-  30 Day Unplanned Readmission Risk Score    Flowsheet Row ED to Hosp-Admission (Current) from 06/13/2023 in [facility]  SURGICAL  30 Day Unplanned Readmission Risk Score (%) 10.52 Filed at 06/14/2023 0800       This score is the patient's risk of an unplanned readmission within 30 days of being discharged (0 -100%). The score is based on dignosis, age, lab data, medications, orders, and past utilization.   Low:  0-14.9   Medium: 15-21.9   High: 22-29.9   Extreme: 30 and above           Subjective: Feeling well when I saw her.  No lightheadedness/dizziness/SOB with exertion.  Bleeding evolved from bright red to dark with clots to melena.   Objective: Vitals:   06/14/23 0514 06/14/23 0834  BP: (!) 144/62 (!) 144/65  Pulse: 78 81  Resp: 18 17  Temp: 97.6 F (36.4 C) 98 F (36.7 C)  SpO2: 97% 98%    Intake/Output Summary (Last 24 hours) at 06/14/2023 1807 Last data filed at 06/14/2023 0300 Gross per 24 hour  Intake 580 ml  Output --  Net 580 ml   Filed Weights   06/13/23 0040  Weight: 90.7 kg    Exam:  General:  Appears calm and comfortable and is in NAD Eyes:  EOMI,  normal lids, iris; large sunglasses in place ENT:  grossly normal hearing, lips & tongue, mmm Neck:  no LAD, masses or thyromegaly Cardiovascular:  RRR, no m/r/g. No LE edema.  Respiratory:   CTA bilaterally with no wheezes/rales/rhonchi.  Normal respiratory effort. Abdomen:  soft, NT, ND Skin:  no rash or induration seen on limited exam Musculoskeletal:  grossly normal tone BUE/BLE, good ROM, no bony abnormality Psychiatric:  grossly normal mood and affect, speech  fluent and appropriate, AOx3 Neurologic:  CN 2-12 grossly intact, moves all extremities in coordinated fashion  Data Reviewed: I have reviewed the patient's lab results since admission.  Pertinent labs for today include:   Unremarkable BMP WBC 6.5 Hgb 9.6, down from 11.7 Urine culture pending    Family Communication: None present  Disposition: Status is: Inpatient Remains inpatient appropriate because: ongoing evaluation and management     Time spent: 50 minutes  Unresulted Labs (From admission, onward)     Start     Ordered   06/15/23 0500  CBC with Differential/Platelet  Tomorrow morning,   R        06/14/23 1807   06/15/23 0500  Basic metabolic panel  Tomorrow morning,   R        06/14/23 1807             Author: Jonah Blue, MD 06/14/2023 6:07 PM  For on call review [email or web address].

## 2023-06-14 NOTE — Progress Notes (Addendum)
Progress Note  Primary GI: Dr. Rhea Belton DOA: 06/13/2023         Hospital Day: 2   Subjective  Chief Complaint: RBR  No family was present at the time of my evaluation. Patient overall is feeling well denies nausea, vomiting. She is having some minor epigastric left upper quadrant discomfort.   She has had 3 more bowel movements this morning the first 1 was darker with clots the last 2 were black stools soft. She is on meloxicam 1 to 2 days a month, occ more, no alcohol she is only on PPI as needed takes the days that she takes meloxicam.    Objective   Vital signs in last 24 hours: Temp:  [97.6 F (36.4 C)-98.2 F (36.8 C)] 98 F (36.7 C) (12/21 0834) Pulse Rate:  [78-86] 81 (12/21 0834) Resp:  [14-18] 17 (12/21 0834) BP: (144-162)/(62-77) 144/65 (12/21 0834) SpO2:  [95 %-98 %] 98 % (12/21 0834) Last BM Date : 06/14/23 Last BM recorded by nurses in past 5 days Stool Type: Type 5 (Soft blobs with clear-cut edges) (06/13/2023  1:00 PM)  General:   female in no acute distress  Heart:  Regular rate and rhythm; no murmurs Pulm: Clear anteriorly; no wheezing Abdomen:  Soft, Non-distended AB, Active bowel sounds. mild tenderness in the epigastrium. Without guarding and Without rebound, No organomegaly appreciated. Extremities:  without  edema. Neurologic:  Alert and  oriented x4;  No focal deficits.  Psych:  Cooperative. Normal mood and affect.  Intake/Output from previous day: 12/20 0701 - 12/21 0700 In: 662.5 [P.O.:480; I.V.:82.5; IV Piggyback:100] Out: -  Intake/Output this shift: No intake/output data recorded.  Studies/Results: CT ANGIO GI BLEED Result Date: 06/13/2023 CLINICAL DATA:  Acute mesenteric ischemia, bright red blood per rectum EXAM: CTA ABDOMEN AND PELVIS WITHOUT AND WITH CONTRAST TECHNIQUE: Multidetector CT imaging of the abdomen and pelvis was performed using the standard protocol during bolus administration of intravenous contrast. Multiplanar  reconstructed images and MIPs were obtained and reviewed to evaluate the vascular anatomy. RADIATION DOSE REDUCTION: This exam was performed according to the departmental dose-optimization program which includes automated exposure control, adjustment of the mA and/or kV according to patient size and/or use of iterative reconstruction technique. CONTRAST:  OMNIPAQUE IOHEXOL 350 MG/ML SOLN COMPARISON:  05/30/2021 FINDINGS: VASCULAR Aorta: Normal caliber aorta without aneurysm, dissection, vasculitis or significant stenosis. Mild atherosclerotic calcification Celiac: Patent without evidence of aneurysm, dissection, vasculitis or significant stenosis. SMA: Patent without evidence of aneurysm, dissection, vasculitis or significant stenosis. Renals: Greater than 50% stenosis of the right renal artery at its origin. Left renal artery is widely patent. Normal vascular morphology. No aneurysm or dissection. IMA: Patent without evidence of aneurysm, dissection, vasculitis or significant stenosis. Inflow: Patent without evidence of aneurysm, dissection, vasculitis or significant stenosis. Internal iliac arteries are patent bilaterally Proximal Outflow: Bilateral common femoral and visualized portions of the superficial and profunda femoral arteries are patent without evidence of aneurysm, dissection, vasculitis or significant stenosis. Veins: There is made of a circumaortic left renal vein. The abdominal and pelvic venous vasculature is otherwise unremarkable. Review of the MIP images confirms the above findings. NON-VASCULAR Lower chest: No acute abnormality. Hepatobiliary: No focal liver abnormality is seen. No gallstones, gallbladder wall thickening, or biliary dilatation. Densely calcified lymph nodes within the biliary hilum are in keeping with old granulomatous disease. Pancreas: Unremarkable. No pancreatic ductal dilatation or surrounding inflammatory changes. Spleen: Normal in size without focal abnormality.  Adrenals/Urinary Tract: Adrenal glands  are unremarkable. Kidneys are normal, without renal calculi, focal lesion, or hydronephrosis. Bladder is unremarkable. Stomach/Bowel: Advanced sigmoid diverticulosis. The stomach, small bowel, and large bowel are otherwise unremarkable there is no evidence of obstruction or focal inflammation. Appendix normal. No active gastrointestinal hemorrhage. No free intraperitoneal gas or fluid. Lymphatic: No pathologic adenopathy within the abdomen and pelvis. Reproductive: Uterus and bilateral adnexa are unremarkable. Other: Tiny fat containing umbilical hernia Musculoskeletal: Degenerative changes are seen within the lumbar spine with grade 1 anterolisthesis L5-S1. No acute bone abnormality. Lytic or blastic bone lesion. IMPRESSION: 1. No active gastrointestinal hemorrhage. 2. Greater than 50% stenosis of the right renal artery at its origin. 3. Advanced sigmoid diverticulosis without superimposed acute inflammatory change. Electronically Signed   By: Helyn Numbers M.D.   On: 06/13/2023 04:31    Lab Results: Recent Labs    06/13/23 0050 06/13/23 0449 06/13/23 1357 06/14/23 0603  WBC 9.5  --   --  6.5  HGB 11.7* 10.2* 11.0* 9.6*  HCT 36.1 31.5* 34.0* 29.7*  PLT 256  --   --  211   BMET Recent Labs    06/13/23 0050 06/14/23 0603  NA 140 141  K 4.0 3.5  CL 103 107  CO2 30 27  GLUCOSE 157* 95  BUN 21 9  CREATININE 0.85 0.87  CALCIUM 9.6 8.6*   LFT Recent Labs    06/13/23 0050  PROT 7.3  ALBUMIN 4.2  AST 17  ALT 7  ALKPHOS 83  BILITOT 0.4   PT/INR Recent Labs    06/13/23 0100  LABPROT 13.2  INR 1.0     Scheduled Meds:  hydrochlorothiazide  25 mg Oral Daily   loratadine  10 mg Oral Daily   pantoprazole  40 mg Oral Daily   Continuous Infusions:  cefTRIAXone (ROCEPHIN)  IV 2 g (06/13/23 2114)      Patient profile:   77 year old female well-known to me with history of prior hepatitis C status posttreatment, well compensated  cirrhosis (possibly advanced fibrosis without cirrhosis), history of colon polyps, chronic constipation and diverticulosis presenting with hematochezia    Impression/Plan:   77 year old female history of severe sigmoid diverticulitis with sepsis 2022 now presents with hematochezia with blood clots  Colonoscopy 10/2019 diverticulosis otherwise unremarkable EGD 02/2022 with gastritis negative H. pylori on pantoprazole daily has not been on it recall EGD due 02/2024 no history of gastric varices hemoglobin 11.7 down from 12.8's repeat 10.2 repeat 9.6 CTA with diverticulosis but no evidence of active GI bleeding Patient now complaining of melenic stools with dark red clots she is hemodynamically stable She has cirrhosis and has been taking meloxicam occasionally not on pantoprazole Getting repeat stat CBC with no melena So very suspicious for diverticular bleed with history but now with the melena history of cirrhosis and meloxicam not on Protonix regularly will suggest getting endoscopy Patient already ate this morning at 9:00, will recheck stat CBC and plan for upper endoscopy tomorrow Full liquid diet today, n.p.o. at midnight EGD tomorrow Will schedule EGD. I discussed risks of EGD with patient today, including risk of sedation, bleeding or perforation.  Patient provides understanding and gave verbal consent to proceed.  History of Chronic hepatitis C s/p treatment with SVR and compensated cirrhosis  EGD 02/2022 without esophageal or gastric varices.  MELD 3.0: 7 at 06/14/2023  6:03 AM      Active Problems:   Essential hypertension   Insomnia   GERD (gastroesophageal reflux disease)   Chronic pain of both  knees   GI bleed   Acute blood loss anemia   Renal artery stenosis (HCC)   Obesity (BMI 30-39.9)   Urinary tract infection    LOS: 1 day   Doree Albee  06/14/2023, 12:56 PM

## 2023-06-14 NOTE — Plan of Care (Signed)

## 2023-06-14 NOTE — H&P (View-Only) (Signed)
Progress Note  Primary GI: Dr. Rhea Belton DOA: 06/13/2023         Hospital Day: 2   Subjective  Chief Complaint: RBR  No family was present at the time of my evaluation. Patient overall is feeling well denies nausea, vomiting. She is having some minor epigastric left upper quadrant discomfort.   She has had 3 more bowel movements this morning the first 1 was darker with clots the last 2 were black stools soft. She is on meloxicam 1 to 2 days a month, occ more, no alcohol she is only on PPI as needed takes the days that she takes meloxicam.    Objective   Vital signs in last 24 hours: Temp:  [97.6 F (36.4 C)-98.2 F (36.8 C)] 98 F (36.7 C) (12/21 0834) Pulse Rate:  [78-86] 81 (12/21 0834) Resp:  [14-18] 17 (12/21 0834) BP: (144-162)/(62-77) 144/65 (12/21 0834) SpO2:  [95 %-98 %] 98 % (12/21 0834) Last BM Date : 06/14/23 Last BM recorded by nurses in past 5 days Stool Type: Type 5 (Soft blobs with clear-cut edges) (06/13/2023  1:00 PM)  General:   female in no acute distress  Heart:  Regular rate and rhythm; no murmurs Pulm: Clear anteriorly; no wheezing Abdomen:  Soft, Non-distended AB, Active bowel sounds. mild tenderness in the epigastrium. Without guarding and Without rebound, No organomegaly appreciated. Extremities:  without  edema. Neurologic:  Alert and  oriented x4;  No focal deficits.  Psych:  Cooperative. Normal mood and affect.  Intake/Output from previous day: 12/20 0701 - 12/21 0700 In: 662.5 [P.O.:480; I.V.:82.5; IV Piggyback:100] Out: -  Intake/Output this shift: No intake/output data recorded.  Studies/Results: CT ANGIO GI BLEED Result Date: 06/13/2023 CLINICAL DATA:  Acute mesenteric ischemia, bright red blood per rectum EXAM: CTA ABDOMEN AND PELVIS WITHOUT AND WITH CONTRAST TECHNIQUE: Multidetector CT imaging of the abdomen and pelvis was performed using the standard protocol during bolus administration of intravenous contrast. Multiplanar  reconstructed images and MIPs were obtained and reviewed to evaluate the vascular anatomy. RADIATION DOSE REDUCTION: This exam was performed according to the departmental dose-optimization program which includes automated exposure control, adjustment of the mA and/or kV according to patient size and/or use of iterative reconstruction technique. CONTRAST:  OMNIPAQUE IOHEXOL 350 MG/ML SOLN COMPARISON:  05/30/2021 FINDINGS: VASCULAR Aorta: Normal caliber aorta without aneurysm, dissection, vasculitis or significant stenosis. Mild atherosclerotic calcification Celiac: Patent without evidence of aneurysm, dissection, vasculitis or significant stenosis. SMA: Patent without evidence of aneurysm, dissection, vasculitis or significant stenosis. Renals: Greater than 50% stenosis of the right renal artery at its origin. Left renal artery is widely patent. Normal vascular morphology. No aneurysm or dissection. IMA: Patent without evidence of aneurysm, dissection, vasculitis or significant stenosis. Inflow: Patent without evidence of aneurysm, dissection, vasculitis or significant stenosis. Internal iliac arteries are patent bilaterally Proximal Outflow: Bilateral common femoral and visualized portions of the superficial and profunda femoral arteries are patent without evidence of aneurysm, dissection, vasculitis or significant stenosis. Veins: There is made of a circumaortic left renal vein. The abdominal and pelvic venous vasculature is otherwise unremarkable. Review of the MIP images confirms the above findings. NON-VASCULAR Lower chest: No acute abnormality. Hepatobiliary: No focal liver abnormality is seen. No gallstones, gallbladder wall thickening, or biliary dilatation. Densely calcified lymph nodes within the biliary hilum are in keeping with old granulomatous disease. Pancreas: Unremarkable. No pancreatic ductal dilatation or surrounding inflammatory changes. Spleen: Normal in size without focal abnormality.  Adrenals/Urinary Tract: Adrenal glands  are unremarkable. Kidneys are normal, without renal calculi, focal lesion, or hydronephrosis. Bladder is unremarkable. Stomach/Bowel: Advanced sigmoid diverticulosis. The stomach, small bowel, and large bowel are otherwise unremarkable there is no evidence of obstruction or focal inflammation. Appendix normal. No active gastrointestinal hemorrhage. No free intraperitoneal gas or fluid. Lymphatic: No pathologic adenopathy within the abdomen and pelvis. Reproductive: Uterus and bilateral adnexa are unremarkable. Other: Tiny fat containing umbilical hernia Musculoskeletal: Degenerative changes are seen within the lumbar spine with grade 1 anterolisthesis L5-S1. No acute bone abnormality. Lytic or blastic bone lesion. IMPRESSION: 1. No active gastrointestinal hemorrhage. 2. Greater than 50% stenosis of the right renal artery at its origin. 3. Advanced sigmoid diverticulosis without superimposed acute inflammatory change. Electronically Signed   By: Helyn Numbers M.D.   On: 06/13/2023 04:31    Lab Results: Recent Labs    06/13/23 0050 06/13/23 0449 06/13/23 1357 06/14/23 0603  WBC 9.5  --   --  6.5  HGB 11.7* 10.2* 11.0* 9.6*  HCT 36.1 31.5* 34.0* 29.7*  PLT 256  --   --  211   BMET Recent Labs    06/13/23 0050 06/14/23 0603  NA 140 141  K 4.0 3.5  CL 103 107  CO2 30 27  GLUCOSE 157* 95  BUN 21 9  CREATININE 0.85 0.87  CALCIUM 9.6 8.6*   LFT Recent Labs    06/13/23 0050  PROT 7.3  ALBUMIN 4.2  AST 17  ALT 7  ALKPHOS 83  BILITOT 0.4   PT/INR Recent Labs    06/13/23 0100  LABPROT 13.2  INR 1.0     Scheduled Meds:  hydrochlorothiazide  25 mg Oral Daily   loratadine  10 mg Oral Daily   pantoprazole  40 mg Oral Daily   Continuous Infusions:  cefTRIAXone (ROCEPHIN)  IV 2 g (06/13/23 2114)      Patient profile:   77 year old female well-known to me with history of prior hepatitis C status posttreatment, well compensated  cirrhosis (possibly advanced fibrosis without cirrhosis), history of colon polyps, chronic constipation and diverticulosis presenting with hematochezia    Impression/Plan:   77 year old female history of severe sigmoid diverticulitis with sepsis 2022 now presents with hematochezia with blood clots  Colonoscopy 10/2019 diverticulosis otherwise unremarkable EGD 02/2022 with gastritis negative H. pylori on pantoprazole daily has not been on it recall EGD due 02/2024 no history of gastric varices hemoglobin 11.7 down from 12.8's repeat 10.2 repeat 9.6 CTA with diverticulosis but no evidence of active GI bleeding Patient now complaining of melenic stools with dark red clots she is hemodynamically stable She has cirrhosis and has been taking meloxicam occasionally not on pantoprazole Getting repeat stat CBC with no melena So very suspicious for diverticular bleed with history but now with the melena history of cirrhosis and meloxicam not on Protonix regularly will suggest getting endoscopy Patient already ate this morning at 9:00, will recheck stat CBC and plan for upper endoscopy tomorrow Full liquid diet today, n.p.o. at midnight EGD tomorrow Will schedule EGD. I discussed risks of EGD with patient today, including risk of sedation, bleeding or perforation.  Patient provides understanding and gave verbal consent to proceed.  History of Chronic hepatitis C s/p treatment with SVR and compensated cirrhosis  EGD 02/2022 without esophageal or gastric varices.  MELD 3.0: 7 at 06/14/2023  6:03 AM      Active Problems:   Essential hypertension   Insomnia   GERD (gastroesophageal reflux disease)   Chronic pain of both  knees   GI bleed   Acute blood loss anemia   Renal artery stenosis (HCC)   Obesity (BMI 30-39.9)   Urinary tract infection    LOS: 1 day   Doree Albee  06/14/2023, 12:56 PM

## 2023-06-15 ENCOUNTER — Inpatient Hospital Stay (HOSPITAL_COMMUNITY): Payer: PPO | Admitting: Anesthesiology

## 2023-06-15 ENCOUNTER — Encounter (HOSPITAL_COMMUNITY)
Admission: EM | Disposition: A | Discharge status: Home / Self Care | Payer: Self-pay | Source: Home / Self Care | Attending: Internal Medicine

## 2023-06-15 ENCOUNTER — Encounter (HOSPITAL_COMMUNITY): Payer: Self-pay | Admitting: Internal Medicine

## 2023-06-15 DIAGNOSIS — K921 Melena: Secondary | ICD-10-CM | POA: Diagnosis not present

## 2023-06-15 DIAGNOSIS — K2960 Other gastritis without bleeding: Secondary | ICD-10-CM | POA: Diagnosis present

## 2023-06-15 DIAGNOSIS — I10 Essential (primary) hypertension: Secondary | ICD-10-CM

## 2023-06-15 DIAGNOSIS — K2970 Gastritis, unspecified, without bleeding: Secondary | ICD-10-CM

## 2023-06-15 DIAGNOSIS — K222 Esophageal obstruction: Secondary | ICD-10-CM

## 2023-06-15 DIAGNOSIS — K2971 Gastritis, unspecified, with bleeding: Secondary | ICD-10-CM | POA: Diagnosis not present

## 2023-06-15 DIAGNOSIS — Z87891 Personal history of nicotine dependence: Secondary | ICD-10-CM | POA: Diagnosis not present

## 2023-06-15 DIAGNOSIS — K449 Diaphragmatic hernia without obstruction or gangrene: Secondary | ICD-10-CM

## 2023-06-15 DIAGNOSIS — K5731 Diverticulosis of large intestine without perforation or abscess with bleeding: Secondary | ICD-10-CM | POA: Diagnosis not present

## 2023-06-15 HISTORY — PX: ESOPHAGOGASTRODUODENOSCOPY (EGD) WITH PROPOFOL: SHX5813

## 2023-06-15 LAB — CBC WITH DIFFERENTIAL/PLATELET
Abs Immature Granulocytes: 0.03 10*3/uL (ref 0.00–0.07)
Basophils Absolute: 0.1 10*3/uL (ref 0.0–0.1)
Basophils Relative: 1 %
Eosinophils Absolute: 0.2 10*3/uL (ref 0.0–0.5)
Eosinophils Relative: 3 %
HCT: 33.1 % — ABNORMAL LOW (ref 36.0–46.0)
Hemoglobin: 10.5 g/dL — ABNORMAL LOW (ref 12.0–15.0)
Immature Granulocytes: 0 %
Lymphocytes Relative: 22 %
Lymphs Abs: 1.7 10*3/uL (ref 0.7–4.0)
MCH: 28.5 pg (ref 26.0–34.0)
MCHC: 31.7 g/dL (ref 30.0–36.0)
MCV: 89.7 fL (ref 80.0–100.0)
Monocytes Absolute: 0.5 10*3/uL (ref 0.1–1.0)
Monocytes Relative: 6 %
Neutro Abs: 5.3 10*3/uL (ref 1.7–7.7)
Neutrophils Relative %: 68 %
Platelets: 215 10*3/uL (ref 150–400)
RBC: 3.69 MIL/uL — ABNORMAL LOW (ref 3.87–5.11)
RDW: 14.6 % (ref 11.5–15.5)
WBC: 7.8 10*3/uL (ref 4.0–10.5)
nRBC: 0 % (ref 0.0–0.2)

## 2023-06-15 LAB — BASIC METABOLIC PANEL
Anion gap: 13 (ref 5–15)
BUN: 11 mg/dL (ref 8–23)
CO2: 25 mmol/L (ref 22–32)
Calcium: 9 mg/dL (ref 8.9–10.3)
Chloride: 101 mmol/L (ref 98–111)
Creatinine, Ser: 0.66 mg/dL (ref 0.44–1.00)
GFR, Estimated: >60 mL/min (ref >60)
Glucose, Bld: 100 mg/dL — ABNORMAL HIGH (ref 70–99)
Potassium: 3.4 mmol/L — ABNORMAL LOW (ref 3.5–5.1)
Sodium: 139 mmol/L (ref 135–145)

## 2023-06-15 LAB — URINE CULTURE: Culture: <10000 — AB

## 2023-06-15 SURGERY — ESOPHAGOGASTRODUODENOSCOPY (EGD) WITH PROPOFOL
Anesthesia: Monitor Anesthesia Care

## 2023-06-15 MED ORDER — LIDOCAINE 2% (20 MG/ML) 5 ML SYRINGE
INTRAMUSCULAR | Status: DC | PRN
  Administered 2023-06-15: 60 mg via INTRAVENOUS

## 2023-06-15 MED ORDER — PROPOFOL 10 MG/ML IV BOLUS
INTRAVENOUS | Status: DC | PRN
  Administered 2023-06-15: 40 mg via INTRAVENOUS
  Administered 2023-06-15: 20 mg via INTRAVENOUS
  Administered 2023-06-15 (×2): 40 mg via INTRAVENOUS

## 2023-06-15 MED ORDER — SODIUM CHLORIDE 0.9 % IV SOLN
2.0000 g | INTRAVENOUS | Status: AC
  Administered 2023-06-15: 2 g via INTRAVENOUS
  Filled 2023-06-15: qty 20

## 2023-06-15 MED ORDER — SODIUM CHLORIDE 0.9 % IV SOLN: INTRAVENOUS | Status: DC

## 2023-06-15 MED ORDER — PROPOFOL 500 MG/50ML IV EMUL
INTRAVENOUS | Status: DC | PRN
  Administered 2023-06-15: 50 ug/kg/min via INTRAVENOUS

## 2023-06-15 MED ORDER — PANTOPRAZOLE SODIUM 40 MG PO TBEC: 40.0000 mg | DELAYED_RELEASE_TABLET | Freq: Every day | ORAL | Status: DC

## 2023-06-15 MED ORDER — GLYCOPYRROLATE 0.2 MG/ML IJ SOLN
INTRAMUSCULAR | Status: DC | PRN
  Administered 2023-06-15: .2 mg via INTRAVENOUS

## 2023-06-15 MED ORDER — PANTOPRAZOLE SODIUM 40 MG PO TBEC: 40.0000 mg | DELAYED_RELEASE_TABLET | Freq: Every day | ORAL | 1 refills | Status: DC

## 2023-06-15 SURGICAL SUPPLY — 14 items

## 2023-06-15 NOTE — Discharge Summary (Signed)
Physician Discharge Summary   Patient: Angela Ramirez MRN: [id number] DOB: 1946-03-25  Admit date:     06/13/2023  Discharge date: 06/15/23  Discharge Physician: Jonah Blue   PCP: Myrlene Broker, MD   Recommendations at discharge:   Avoid anti-inflammatory medications (NSAIDS, meloxicam/Mobic), if possible Take Protonix daily for at least 6 weeks and indefinitely for as long as you are taking NSAIDs Follow up with Dr. Okey Dupre in 1-2 weeks  Discharge Diagnoses: Principal Problem:   Hemorrhage of large intestine due to diverticular disease Active Problems:   Acute blood loss anemia   Urinary tract infection   Essential hypertension   Chronic pain of both knees   Renal artery stenosis (HCC)   Insomnia   GERD (gastroesophageal reflux disease)   Obesity (BMI 30-39.9)   Nonerosive nonspecific gastritis    Hospital Course: 77yo with h/o HTN, HLD, chronic Hepatitis C s/p treatment, and depression/anxiety who presented on 12/20 with rectal bleeding.  CTA negative.  Hgb 11.7 -> 10.2.  GI consulted.  Also noted to have UTI, treated with Ceftriaxone.  Assessment and Plan:  Lower GI Bleeding Patient presents with complaints of rectal bleeding Hemoglobin 12.8 -> 9.6   Last colonoscopy in 2021 with multiple diverticuli and internal hemorrhoids GI has consulted Likely diverticular bleeding, which appears to have abated but started passing tarry stools so EGD scheduled EGD with non-obstructing Schatzki ring, 2 cm hiatal hernia, and non-bleeding erosive gastritis Bleeding resolved Take Protonix daily for at least 6 weeks and indefinitely while taking NSAIDs   ABLA Hgb 12.8 -> 9.6 in the setting of GI bleeding Back up to 10.2 now   Urinary tract infection Patient reports having urinary frequency and urgency without dysuria over the last couple days UA with large Hgb, trace ketones, moderate LE, + nitrite, trace protein, rare bacteria - appears to be c/w UTI Urine  culture negative Completed Ceftriaxone x 3 doses   Essential hypertension Blood pressures elevated up to 171/76 Patient reports that she is no longer taking amlodipine but continues to take hydrochlorothiazide 25 mg daily.  Last fill dates reported not to match. Continue hydrochlorothiazide   Chronic pain secondary to osteoarthritis of the bilateral knees Recommended holding meloxicam due to GI bleed Continue tramadol as needed   Renal artery stenosis CT angiogram bleeding study noted greater than 50% stenosis of right renal artery at its origin.   Chronic hepatitis C Patient with history of hepatitis C s/p treatment with sustained virologic response and compensated cirrhosis    Insomnia No longer taking temazepam   GERD Continue Protonix as above   Class 1 Obesity Body mass index is 32.28 kg/m.Marland Kitchen  Weight loss should be encouraged Outpatient PCP/bariatric medicine f/u encouraged          Consultants: GI   Procedures: EGD 12/22   Antibiotics: Ceftriaxone 12/20-22    Pain control - [facility] Controlled Substance Reporting System database was reviewed. and patient was instructed, not to drive, operate heavy machinery, perform activities at heights, swimming or participation in water activities or provide baby-sitting services while on Pain, Sleep and Anxiety Medications; until their outpatient Physician has advised to do so again. Also recommended to not to take more than prescribed Pain, Sleep and Anxiety Medications.   Disposition: Home Diet recommendation:  Regular diet DISCHARGE MEDICATION: Allergies as of 06/15/2023       Reactions   Crab (diagnostic) Hives   Morphine And Codeine Nausea And Vomiting   Sulfa Antibiotics Nausea And Vomiting  Medication List     STOP taking these medications    meloxicam 15 MG tablet Commonly known as: MOBIC   temazepam 15 MG capsule Commonly known as: RESTORIL   Zepbound 2.5 MG/0.5ML Pen Generic drug:  tirzepatide   Zepbound 5 MG/0.5ML Pen Generic drug: tirzepatide   Zepbound 7.5 MG/0.5ML Pen Generic drug: tirzepatide       TAKE these medications    cetirizine 10 MG tablet Commonly known as: ZYRTEC Take 10 mg by mouth daily as needed for allergies.   hydrochlorothiazide 25 MG tablet Commonly known as: HYDRODIURIL Take 1 tablet (25 mg total) by mouth daily.   levalbuterol 45 MCG/ACT inhaler Commonly known as: XOPENEX HFA Inhale 1-2 puffs into the lungs every 6 (six) hours as needed for wheezing or shortness of breath. What changed: how much to take   linaclotide 72 MCG capsule Commonly known as: Linzess Take 1 capsule (72 mcg total) by mouth daily before breakfast. What changed:  when to take this reasons to take this   meclizine 12.5 MG tablet Commonly known as: ANTIVERT Take 1 tablet (12.5 mg total) by mouth 3 (three) times daily as needed for dizziness.   pantoprazole 40 MG tablet Commonly known as: PROTONIX Take 1 tablet (40 mg total) by mouth daily before breakfast.   tiZANidine 2 MG tablet Commonly known as: ZANAFLEX TAKE 1 TABLET(2 MG) BY MOUTH EVERY 8 HOURS AS NEEDED FOR MUSCLE SPASMS What changed: See the new instructions.   traMADol 50 MG tablet Commonly known as: ULTRAM Take 1 tablet (50 mg total) by mouth every 12 (twelve) hours as needed. What changed:  when to take this reasons to take this        Discharge Exam:   Subjective: Feels great today and she is eager to go home.   Objective: Vitals:   06/15/23 1030 06/15/23 1040  BP: 132/72 136/70  Pulse: 92 88  Resp: 17 17  Temp:    SpO2: 96% 96%    Intake/Output Summary (Last 24 hours) at 06/15/2023 1245 Last data filed at 06/15/2023 1014 Gross per 24 hour  Intake 75 ml  Output --  Net 75 ml   Filed Weights   06/13/23 0040 06/15/23 0916  Weight: 90.7 kg 90.7 kg    Exam:  General:  Appears calm and comfortable and is in NAD Eyes:  EOMI, normal lids, iris; large  sunglasses in place ENT:  grossly normal hearing, lips & tongue, mmm Neck:  no LAD, masses or thyromegaly Cardiovascular:  RRR, no m/r/g. No LE edema.  Respiratory:   CTA bilaterally with no wheezes/rales/rhonchi.  Normal respiratory effort. Abdomen:  soft, NT, ND Skin:  no rash or induration seen on limited exam Musculoskeletal:  grossly normal tone BUE/BLE, good ROM, no bony abnormality Psychiatric:  grossly normal mood and affect, speech fluent and appropriate, AOx3 Neurologic:  CN 2-12 grossly intact, moves all extremities in coordinated fashion  Data Reviewed: I have reviewed the patient's lab results since admission.  Pertinent labs for today include:   BMP pending WBC 7.8 Hgb 10.5, stable Urine culture negative    Condition at discharge: good  The results of significant diagnostics from this hospitalization (including imaging, microbiology, ancillary and laboratory) are listed below for reference.   Imaging Studies: CT ANGIO GI BLEED Result Date: 06/13/2023 CLINICAL DATA:  Acute mesenteric ischemia, bright red blood per rectum EXAM: CTA ABDOMEN AND PELVIS WITHOUT AND WITH CONTRAST TECHNIQUE: Multidetector CT imaging of the abdomen and pelvis was performed using  the standard protocol during bolus administration of intravenous contrast. Multiplanar reconstructed images and MIPs were obtained and reviewed to evaluate the vascular anatomy. RADIATION DOSE REDUCTION: This exam was performed according to the departmental dose-optimization program which includes automated exposure control, adjustment of the mA and/or kV according to patient size and/or use of iterative reconstruction technique. CONTRAST:  OMNIPAQUE IOHEXOL 350 MG/ML SOLN COMPARISON:  05/30/2021 FINDINGS: VASCULAR Aorta: Normal caliber aorta without aneurysm, dissection, vasculitis or significant stenosis. Mild atherosclerotic calcification Celiac: Patent without evidence of aneurysm, dissection, vasculitis or  significant stenosis. SMA: Patent without evidence of aneurysm, dissection, vasculitis or significant stenosis. Renals: Greater than 50% stenosis of the right renal artery at its origin. Left renal artery is widely patent. Normal vascular morphology. No aneurysm or dissection. IMA: Patent without evidence of aneurysm, dissection, vasculitis or significant stenosis. Inflow: Patent without evidence of aneurysm, dissection, vasculitis or significant stenosis. Internal iliac arteries are patent bilaterally Proximal Outflow: Bilateral common femoral and visualized portions of the superficial and profunda femoral arteries are patent without evidence of aneurysm, dissection, vasculitis or significant stenosis. Veins: There is made of a circumaortic left renal vein. The abdominal and pelvic venous vasculature is otherwise unremarkable. Review of the MIP images confirms the above findings. NON-VASCULAR Lower chest: No acute abnormality. Hepatobiliary: No focal liver abnormality is seen. No gallstones, gallbladder wall thickening, or biliary dilatation. Densely calcified lymph nodes within the biliary hilum are in keeping with old granulomatous disease. Pancreas: Unremarkable. No pancreatic ductal dilatation or surrounding inflammatory changes. Spleen: Normal in size without focal abnormality. Adrenals/Urinary Tract: Adrenal glands are unremarkable. Kidneys are normal, without renal calculi, focal lesion, or hydronephrosis. Bladder is unremarkable. Stomach/Bowel: Advanced sigmoid diverticulosis. The stomach, small bowel, and large bowel are otherwise unremarkable there is no evidence of obstruction or focal inflammation. Appendix normal. No active gastrointestinal hemorrhage. No free intraperitoneal gas or fluid. Lymphatic: No pathologic adenopathy within the abdomen and pelvis. Reproductive: Uterus and bilateral adnexa are unremarkable. Other: Tiny fat containing umbilical hernia Musculoskeletal: Degenerative changes are  seen within the lumbar spine with grade 1 anterolisthesis L5-S1. No acute bone abnormality. Lytic or blastic bone lesion. IMPRESSION: 1. No active gastrointestinal hemorrhage. 2. Greater than 50% stenosis of the right renal artery at its origin. 3. Advanced sigmoid diverticulosis without superimposed acute inflammatory change. Electronically Signed   By: Helyn Numbers M.D.   On: 06/13/2023 04:31   Intravitreal Injection, Pharmacologic Agent - OS - Left Eye Result Date: 06/06/2023 Time Out 06/06/2023. 11:02 AM. Confirmed correct patient, procedure, site, and patient consented. Anesthesia Topical anesthesia was used. Anesthetic medications included Lidocaine 2%, Proparacaine 0.5%. Procedure Preparation included 5% betadine to ocular surface, eyelid speculum. A (32g) needle was used. Injection: 2 mg aflibercept 2 MG/0.05ML   Route: Intravitreal, Site: Left Eye   NDC: L6038910, Lot: [id number], Expiration date: 11/22/2023, Waste: 0 mL Post-op Post injection exam found visual acuity of at least counting fingers. The patient tolerated the procedure well. There were no complications. The patient received written and verbal post procedure care education.   OCT, Retina - OU - Both Eyes Result Date: 06/06/2023 Right Eye Quality was good. Central Foveal Thickness: 259. Progression has been stable. Findings include normal foveal contour, no IRF, no SRF, myopic contour. Left Eye Quality was good. Central Foveal Thickness: 315. Progression has been stable. Findings include abnormal foveal contour, myopic contour, retinal drusen , subretinal hyper-reflective material, intraretinal fluid, pigment epithelial detachment, subretinal fluid, outer retinal atrophy (stable improvement in CNV with IRF/SRF and  IRHM/SRHM). Notes *Images captured and stored on drive Diagnosis / Impression: OD: NFP, no IRF/SRF; Myopic contour OS: stable improvement in CNV with IRF/SRF and IRHM/SRHM Clinical management: See below Abbreviations: NFP  - Normal foveal profile. CME - cystoid macular edema. PED - pigment epithelial detachment. IRF - intraretinal fluid. SRF - subretinal fluid. EZ - ellipsoid zone. ERM - epiretinal membrane. ORA - outer retinal atrophy. ORT - outer retinal tubulation. SRHM - subretinal hyper-reflective material. IRHM - intraretinal hyper-reflective material    Microbiology: Results for orders placed or performed during the hospital encounter of 06/13/23  Urine Culture (for pregnant, neutropenic or urologic patients or patients with an indwelling urinary catheter)     Status: Abnormal   Collection Time: 06/14/23 12:06 AM   Specimen: Urine, Clean Catch  Result Value Ref Range Status   Specimen Description URINE, CLEAN CATCH  Final   Special Requests NONE  Final   Culture (A)  Final    <10,000 COLONIES/mL INSIGNIFICANT GROWTH Performed at [facility], [address] [place], [place] [address]    Report Status 06/15/2023 FINAL  Final    Labs: CBC: Recent Labs  Lab 06/13/23 0050 06/13/23 0449 06/13/23 1357 06/14/23 0603 06/14/23 1412 06/15/23 0631  WBC 9.5  --   --  6.5 7.8 7.8  NEUTROABS  --   --   --   --   --  5.3  HGB 11.7* 10.2* 11.0* 9.6* 10.2* 10.5*  HCT 36.1 31.5* 34.0* 29.7* 30.8* 33.1*  MCV 90.0  --   --  88.7 89.3 89.7  PLT 256  --   --  211 209 215   Basic Metabolic Panel: Recent Labs  Lab 06/13/23 0050 06/14/23 0603 06/15/23 0631  NA 140 141 139  K 4.0 3.5 3.4*  CL 103 107 101  CO2 30 27 25   GLUCOSE 157* 95 100*  BUN 21 9 11   CREATININE 0.85 0.87 0.66  CALCIUM 9.6 8.6* 9.0   Liver Function Tests: Recent Labs  Lab 06/13/23 0050  AST 17  ALT 7  ALKPHOS 83  BILITOT 0.4  PROT 7.3  ALBUMIN 4.2   CBG: No results for input(s): "GLUCAP" in the last 168 hours.  Discharge time spent: greater than 30 minutes.  Signed: Jonah Blue, MD [facility] 06/15/2023

## 2023-06-15 NOTE — Anesthesia Preprocedure Evaluation (Addendum)
Anesthesia Evaluation  Patient identified by MRN, date of birth, ID band Patient awake    Reviewed: Allergy & Precautions, NPO status , Patient's Chart, lab work & pertinent test results, reviewed documented beta blocker date and time   History of Anesthesia Complications Negative for: history of anesthetic complications  Airway Mallampati: III  TM Distance: >3 FB     Dental  (+) Edentulous Upper, Missing, Poor Dentition   Pulmonary COPD, former smoker   breath sounds clear to auscultation       Cardiovascular hypertension, + Peripheral Vascular Disease   Rhythm:Regular Rate:Normal     Neuro/Psych  Headaches, neg Seizures PSYCHIATRIC DISORDERS Anxiety Depression     Neuromuscular disease    GI/Hepatic hiatal hernia,GERD  ,,(+) Cirrhosis       , Hepatitis -, C  Endo/Other    Renal/GU      Musculoskeletal  (+) Arthritis ,    Abdominal   Peds  Hematology  (+) Blood dyscrasia, anemia   Anesthesia Other Findings   Reproductive/Obstetrics                             Anesthesia Physical Anesthesia Plan  ASA: 3  Anesthesia Plan: MAC   Post-op Pain Management:    Induction: Intravenous  PONV Risk Score and Plan: 2  Airway Management Planned: Nasal Cannula and Natural Airway  Additional Equipment:   Intra-op Plan:   Post-operative Plan:   Informed Consent: I have reviewed the patients History and Physical, chart, labs and discussed the procedure including the risks, benefits and alternatives for the proposed anesthesia with the patient or authorized representative who has indicated his/her understanding and acceptance.     Dental advisory given  Plan Discussed with: CRNA  Anesthesia Plan Comments:        Anesthesia Quick Evaluation

## 2023-06-15 NOTE — Plan of Care (Signed)
  Problem: Education: Goal: Knowledge of General Education information will improve Description: Including pain rating scale, medication(s)/side effects and non-pharmacologic comfort measures 06/15/2023 1429 by Kelli Hope, RN Outcome: Adequate for Discharge 06/15/2023 1429 by Kelli Hope, RN Outcome: Adequate for Discharge   Problem: Health Behavior/Discharge Planning: Goal: Ability to manage health-related needs will improve 06/15/2023 1429 by Kelli Hope, RN Outcome: Adequate for Discharge 06/15/2023 1429 by Kelli Hope, RN Outcome: Adequate for Discharge   Problem: Clinical Measurements: Goal: Ability to maintain clinical measurements within normal limits will improve 06/15/2023 1429 by Kelli Hope, RN Outcome: Adequate for Discharge 06/15/2023 1429 by Kelli Hope, RN Outcome: Adequate for Discharge Goal: Will remain free from infection 06/15/2023 1429 by Kelli Hope, RN Outcome: Adequate for Discharge 06/15/2023 1429 by Kelli Hope, RN Outcome: Adequate for Discharge Goal: Diagnostic test results will improve 06/15/2023 1429 by Kelli Hope, RN Outcome: Adequate for Discharge 06/15/2023 1429 by Kelli Hope, RN Outcome: Adequate for Discharge Goal: Respiratory complications will improve 06/15/2023 1429 by Kelli Hope, RN Outcome: Adequate for Discharge 06/15/2023 1429 by Kelli Hope, RN Outcome: Adequate for Discharge Goal: Cardiovascular complication will be avoided 06/15/2023 1429 by Kelli Hope, RN Outcome: Adequate for Discharge 06/15/2023 1429 by Kelli Hope, RN Outcome: Adequate for Discharge   Problem: Activity: Goal: Risk for activity intolerance will decrease 06/15/2023 1429 by Kelli Hope, RN Outcome: Adequate for Discharge 06/15/2023 1429 by Kelli Hope, RN Outcome: Adequate for Discharge   Problem: Nutrition: Goal: Adequate nutrition will be maintained 06/15/2023 1429 by  Kelli Hope, RN Outcome: Adequate for Discharge 06/15/2023 1429 by Kelli Hope, RN Outcome: Adequate for Discharge   Problem: Coping: Goal: Level of anxiety will decrease 06/15/2023 1429 by Kelli Hope, RN Outcome: Adequate for Discharge 06/15/2023 1429 by Kelli Hope, RN Outcome: Adequate for Discharge   Problem: Elimination: Goal: Will not experience complications related to bowel motility 06/15/2023 1429 by Kelli Hope, RN Outcome: Adequate for Discharge 06/15/2023 1429 by Kelli Hope, RN Outcome: Adequate for Discharge Goal: Will not experience complications related to urinary retention 06/15/2023 1429 by Kelli Hope, RN Outcome: Adequate for Discharge 06/15/2023 1429 by Kelli Hope, RN Outcome: Adequate for Discharge   Problem: Pain Management: Goal: General experience of comfort will improve 06/15/2023 1429 by Kelli Hope, RN Outcome: Adequate for Discharge 06/15/2023 1429 by Kelli Hope, RN Outcome: Adequate for Discharge   Problem: Safety: Goal: Ability to remain free from injury will improve 06/15/2023 1429 by Kelli Hope, RN Outcome: Adequate for Discharge 06/15/2023 1429 by Kelli Hope, RN Outcome: Adequate for Discharge   Problem: Skin Integrity: Goal: Risk for impaired skin integrity will decrease 06/15/2023 1429 by Kelli Hope, RN Outcome: Adequate for Discharge 06/15/2023 1429 by Kelli Hope, RN Outcome: Adequate for Discharge

## 2023-06-15 NOTE — Anesthesia Postprocedure Evaluation (Signed)
Anesthesia Post Note  Patient: Angela Ramirez  Procedure(s) Performed: ESOPHAGOGASTRODUODENOSCOPY (EGD) WITH PROPOFOL     Patient location during evaluation: PACU Anesthesia Type: MAC Level of consciousness: awake and alert Pain management: pain level controlled Vital Signs Assessment: post-procedure vital signs reviewed and stable Respiratory status: spontaneous breathing, nonlabored ventilation, respiratory function stable and patient connected to nasal cannula oxygen Cardiovascular status: stable and blood pressure returned to baseline Postop Assessment: no apparent nausea or vomiting Anesthetic complications: no   No notable events documented.  Last Vitals:  Vitals:   06/15/23 1030 06/15/23 1040  BP: 132/72 136/70  Pulse: 92 88  Resp: 17 17  Temp:    SpO2: 96% 96%    Last Pain:  Vitals:   06/15/23 1020  TempSrc: Temporal  PainSc: 0-No pain                 Mariann Barter

## 2023-06-15 NOTE — Interval H&P Note (Signed)
History and Physical Interval Note: For EGD today to exclude upper GI bleed given recent NSAID use, history of PUD as well as well compensated cirrhosis.  Initial presentation felt to be diverticular hemorrhage but today to exclude upper GI source The nature of the procedure, as well as the risks, benefits, and alternatives were carefully and thoroughly reviewed with the patient. Ample time for discussion and questions allowed. The patient understood, was satisfied, and agreed to proceed.   06/15/2023 9:45 AM  Angela Ramirez  has presented today for surgery, with the diagnosis of melena, anemia.  The various methods of treatment have been discussed with the patient and family. After consideration of risks, benefits and other options for treatment, the patient has consented to  Procedure(s): ESOPHAGOGASTRODUODENOSCOPY (EGD) WITH PROPOFOL (N/A) as a surgical intervention.  The patient's history has been reviewed, patient examined, no change in status, stable for surgery.  I have reviewed the patient's chart and labs.  Questions were answered to the patient's satisfaction.     [name] [name]

## 2023-06-15 NOTE — Op Note (Signed)
[facility] Patient Name: Angela Ramirez Procedure Date : 06/15/2023 MRN: [id number] Attending MD: Beverley Fiedler , MD, [id number] Date of Birth: 08-13-1945 CSN: [id number] Age: 77 Admit Type: Inpatient Procedure:                Upper GI endoscopy Indications:              Hematochezia, Melena, Recent gastrointestinal                            bleeding Providers:                [name]. Rhea Belton, MD, Suzy Bouchard, RN, Harrington Challenger,                            Technician Referring MD:             [facility] Medicines:                Monitored Anesthesia Care Complications:            No immediate complications. Estimated Blood Loss:     Estimated blood loss: none. Procedure:                Pre-Anesthesia Assessment:                           - Prior to the procedure, a History and Physical                            was performed, and patient medications and                            allergies were reviewed. The patient's tolerance of                            previous anesthesia was also reviewed. The risks                            and benefits of the procedure and the sedation                            options and risks were discussed with the patient.                            All questions were answered, and informed consent                            was obtained. Prior Anticoagulants: The patient has                            taken no anticoagulant or antiplatelet agents. ASA                            Grade Assessment: III - A patient with severe  systemic disease. After reviewing the risks and                            benefits, the patient was deemed in satisfactory                            condition to undergo the procedure.                           After obtaining informed consent, the endoscope was                            passed under direct vision. Throughout the                            procedure, the patient's  blood pressure, pulse, and                            oxygen saturations were monitored continuously. The                            [id number]-H190 ([id number]) Olympus endoscope was introduced                            through the mouth, and advanced to the second part                            of duodenum. The upper GI endoscopy was                            accomplished without difficulty. The patient                            tolerated the procedure well. Scope In: Scope Out: Findings:      The examined esophagus was normal. No varices.      A non-obstructing Schatzki ring was found at the gastroesophageal       junction.      A 2 cm hiatal hernia was present.      The cardia and gastric fundus were normal on retroflexion. No varices.      Moderate inflammation characterized by congestion (edema), erythema and       linear erosions was found on the greater curvature of the gastric body       and in the gastric antrum.      The examined duodenum was normal. Impression:               - Normal esophagus.                           - Non-obstructing Schatzki ring.                           - 2 cm hiatal hernia.                           - Erosive gastritis (NSAID  related). Non-bleeding.                           - Normal examined duodenum.                           - No evidence of UGI bleeding, likely recent                            diverticular hemorrhage.                           - No specimens collected. Moderate Sedation:      N/A Recommendation:           - Return patient to hospital ward for ongoing care.                           - Resume regular diet.                           - Continue present medications. Daily PPI is                            recommended in setting of erosive gastritis and                            NSAIDs. If not using NSAIDs, PPI can be stopped in                            6 weeks.                           - Supportive care, expect discharge soon  assuming                            no further bleeding. Procedure Code(s):        --- Professional ---                           [phone], Esophagogastroduodenoscopy, flexible,                            transoral; diagnostic, including collection of                            specimen(s) by brushing or washing, when performed                            (separate procedure) Diagnosis Code(s):        --- Professional ---                           K22.2, Esophageal obstruction                           K44.9, Diaphragmatic hernia without obstruction or  gangrene                           K29.70, Gastritis, unspecified, without bleeding                           K92.1, Melena (includes Hematochezia)                           K92.2, Gastrointestinal hemorrhage, unspecified CPT copyright 2022 [employer] Association. All rights reserved. The codes documented in this report are preliminary and upon coder review may  be revised to meet current compliance requirements. Beverley Fiedler, MD 06/15/2023 10:23:25 AM This report has been signed electronically. Number of Addenda: 0

## 2023-06-15 NOTE — Transfer of Care (Signed)
Immediate Anesthesia Transfer of Care Note  Patient: MIYANA BEARDMORE  Procedure(s) Performed: ESOPHAGOGASTRODUODENOSCOPY (EGD) WITH PROPOFOL  Patient Location: Endoscopy Unit  Anesthesia Type:MAC  Level of Consciousness: drowsy  Airway & Oxygen Therapy: Patient Spontanous Breathing  Post-op Assessment: Report given to RN and Post -op Vital signs reviewed and stable  Post vital signs: Reviewed and stable  Last Vitals:  Vitals Value Taken Time  BP    Temp    Pulse 96 06/15/23 1019  Resp 34 06/15/23 1019  SpO2 90 % 06/15/23 1019  Vitals shown include unfiled device data.  Last Pain:  Vitals:   06/15/23 0916  TempSrc: Temporal  PainSc: 0-No pain         Complications: No notable events documented.

## 2023-06-16 ENCOUNTER — Telehealth: Payer: Self-pay | Admitting: *Deleted

## 2023-06-16 ENCOUNTER — Telehealth: Payer: Self-pay

## 2023-06-16 DIAGNOSIS — Z8719 Personal history of other diseases of the digestive system: Secondary | ICD-10-CM

## 2023-06-16 NOTE — Telephone Encounter (Signed)
-----   Message from [name] Pyrtle sent at 06/15/2023 11:23 AM EST ----- Cbc, ferritin in 2 weeks Recent diverticular bleeding JMP

## 2023-06-16 NOTE — Transitions of Care (Post Inpatient/ED Visit) (Signed)
06/16/2023  Name: Angela Ramirez MRN: [id number] DOB: 1945/07/26  Today's TOC FU Call Status: Today's TOC FU Call Status:: Successful TOC FU Call Completed TOC FU Call Complete Date: 06/16/23 Patient's Name and Date of Birth confirmed.  Transition Care Management Follow-up Telephone Call Date of Discharge: 06/15/23 Discharge Facility: [facility]) Type of Discharge: Inpatient Admission Primary Inpatient Discharge Diagnosis:: Hemorrhage of anus and rectum How have you been since you were released from the hospital?: Better Any questions or concerns?: No  Items Reviewed: Did you receive and understand the discharge instructions provided?: Yes Medications obtained,verified, and reconciled?: Yes (Medications Reviewed) Any new allergies since your discharge?: No Dietary orders reviewed?: Yes Type of Diet Ordered:: heart healthy Do you have support at home?: Yes People in Home: other relative(s)  Medications Reviewed Today: Medications Reviewed Today     Reviewed by Amethyst Gainer, Jordan Hawks, CMA (Certified Medical Assistant) on 06/16/23 at 1210  Med List Status: <None>   Medication Order Taking? Sig Documenting Provider Last Dose Status Informant  cetirizine (ZYRTEC) 10 MG tablet [id number] No Take 10 mg by mouth daily as needed for allergies. [provider] Past Week Active Self, Pharmacy Records  hydrochlorothiazide (HYDRODIURIL) 25 MG tablet [id number] No Take 1 tablet (25 mg total) by mouth daily. Myrlene Broker, MD 06/11/2023 Active Self, Pharmacy Records           Med Note (CRUTHIS, CHLOE C   Fri Jun 13, 2023  2:02 PM) Pt is adamant she is still taking this medication daily. Dispense report does not support this claim.   levalbuterol [facility] HFA) 45 MCG/ACT inhaler [id number] No Inhale 1-2 puffs into the lungs every 6 (six) hours as needed for wheezing or shortness of breath.  Patient taking differently: Inhale 2 puffs into the lungs every 6 (six) hours as needed  for wheezing or shortness of breath.   Gwenlyn Fudge, FNP Past Month Active Self, Pharmacy Records  linaclotide [facility]) 72 MCG capsule [id number] No Take 1 capsule (72 mcg total) by mouth daily before breakfast.  Patient taking differently: Take 72 mcg by mouth daily as needed (constipation).   Myrlene Broker, MD Past Month Active Self, Pharmacy Records  meclizine (ANTIVERT) 12.5 MG tablet [id number] No Take 1 tablet (12.5 mg total) by mouth 3 (three) times daily as needed for dizziness. Myrlene Broker, MD Past Month Active Self, Pharmacy Records  pantoprazole (PROTONIX) 40 MG tablet [id number]  Take 1 tablet (40 mg total) by mouth daily before breakfast. Jonah Blue, MD  Active   tiZANidine (ZANAFLEX) 2 MG tablet [id number] No TAKE 1 TABLET(2 MG) BY MOUTH EVERY 8 HOURS AS NEEDED FOR MUSCLE SPASMS  Patient taking differently: Take 2 mg by mouth 2 (two) times daily as needed for muscle spasms.   Myrlene Broker, MD 06/12/2023 Active Self, Pharmacy Records  traMADol (ULTRAM) 50 MG tablet [id number] No Take 1 tablet (50 mg total) by mouth every 12 (twelve) hours as needed.  Patient taking differently: Take 50 mg by mouth 2 (two) times daily as needed for moderate pain (pain score 4-6) or severe pain (pain score 7-10).   Myrlene Broker, MD 06/11/2023 Active Self, Pharmacy Records            Home Care and Equipment/Supplies: Were Home Health Services Ordered?: NA Any new equipment or medical supplies ordered?: NA  Functional Questionnaire: Do you need assistance with bathing/showering or dressing?: No Do you need assistance with meal preparation?: No Do you need  assistance with eating?: No Do you have difficulty maintaining continence: No Do you need assistance with getting out of bed/getting out of a chair/moving?: No Do you have difficulty managing or taking your medications?: No  Follow up appointments reviewed: PCP Follow-up appointment confirmed?:  Yes Date of PCP follow-up appointment?: 06/23/23 Follow-up Provider: Hillard Danker, MD Specialist Hospital Follow-up appointment confirmed?: NA Do you need transportation to your follow-up appointment?: No Do you understand care options if your condition(s) worsen?: Yes-patient verbalized understanding    Abby Jahayra Mazo, CMA  [facility] AWV Team Direct Dial: [phone]

## 2023-06-18 ENCOUNTER — Encounter (HOSPITAL_COMMUNITY): Payer: Self-pay | Admitting: Internal Medicine

## 2023-06-18 ENCOUNTER — Telehealth: Payer: Self-pay | Admitting: Gastroenterology

## 2023-06-18 NOTE — Telephone Encounter (Signed)
Patient called on call provider because she saw some bright red blood in the toilet with her bowel movement today.  She was admitted to the hospital for similar symptoms last week.  CTA was negative.  EGD did not reveal any source of bleeding. She had a colonoscopy in 2021 with hemorrhoids and diverticulosis.  She reports that the blood that she saw was small in volume, but she was not sure if she needed to go to the emergency department.  We discussed the differences between diverticular bleeding and hemorrhoidal bleeding.  She is at risk for both.  The bleeding she is describing sounds more like hemorrhoidal bleeding.  She is not having any symptoms of lightheadedness, dizziness or significant shortness of breath.  I advised the patient to go to the emergency room if she does experience any large-volume bloody bowel movements, or experiences any of those above symptoms.    The patient appreciated the reassurance.

## 2023-06-19 ENCOUNTER — Other Ambulatory Visit: Payer: Self-pay | Admitting: Internal Medicine

## 2023-06-23 ENCOUNTER — Ambulatory Visit (INDEPENDENT_AMBULATORY_CARE_PROVIDER_SITE_OTHER): Payer: PPO | Admitting: Internal Medicine

## 2023-06-23 ENCOUNTER — Encounter: Payer: Self-pay | Admitting: Internal Medicine

## 2023-06-23 VITALS — BP 148/80 | HR 88 | Temp 98.3°F | Ht 66.0 in | Wt 205.0 lb

## 2023-06-23 DIAGNOSIS — K5731 Diverticulosis of large intestine without perforation or abscess with bleeding: Secondary | ICD-10-CM

## 2023-06-23 DIAGNOSIS — D62 Acute posthemorrhagic anemia: Secondary | ICD-10-CM | POA: Diagnosis not present

## 2023-06-23 LAB — CBC
HCT: 33.6 % — ABNORMAL LOW (ref 36.0–46.0)
Hemoglobin: 11.3 g/dL — ABNORMAL LOW (ref 12.0–15.0)
MCHC: 33.6 g/dL (ref 30.0–36.0)
MCV: 86.9 fL (ref 78.0–100.0)
Platelets: 389 10*3/uL (ref 150.0–400.0)
RBC: 3.86 Mil/uL — ABNORMAL LOW (ref 3.87–5.11)
RDW: 14.5 % (ref 11.5–15.5)
WBC: 6.9 10*3/uL (ref 4.0–10.5)

## 2023-06-23 LAB — FERRITIN: Ferritin: 22.9 ng/mL (ref 10.0–291.0)

## 2023-06-23 MED ORDER — TEMAZEPAM 15 MG PO CAPS: 15.0000 mg | ORAL_CAPSULE | Freq: Every evening | ORAL | 0 refills | Status: DC | PRN

## 2023-06-23 MED ORDER — TERCONAZOLE 0.8 % VA CREA: 1.0000 | TOPICAL_CREAM | Freq: Every day | VAGINAL | 0 refills | Status: DC

## 2023-06-23 MED ORDER — FLUCONAZOLE 150 MG PO TABS: 150.0000 mg | ORAL_TABLET | ORAL | 1 refills | Status: DC

## 2023-06-23 NOTE — Patient Instructions (Signed)
We will check the labs today. 

## 2023-06-24 ENCOUNTER — Encounter: Payer: Self-pay | Admitting: Internal Medicine

## 2023-06-24 NOTE — Assessment & Plan Note (Signed)
Checking CBC and ferritin today. Adjust as needed. Not on iron currently to avoid masking dark stools. Small episode of hemorrhoid bleeding since discharge but no significant blood in stool.

## 2023-06-24 NOTE — Assessment & Plan Note (Signed)
No recurrence since leaving hospital and she is eating more bland diet than normal. No constipation or diarrhea reported. Advised she can

## 2023-07-01 ENCOUNTER — Other Ambulatory Visit: Payer: Self-pay | Admitting: *Deleted

## 2023-07-01 DIAGNOSIS — K7460 Unspecified cirrhosis of liver: Secondary | ICD-10-CM

## 2023-07-01 DIAGNOSIS — Z8719 Personal history of other diseases of the digestive system: Secondary | ICD-10-CM

## 2023-07-01 DIAGNOSIS — K2970 Gastritis, unspecified, without bleeding: Secondary | ICD-10-CM

## 2023-07-01 NOTE — Progress Notes (Signed)
===  View-only below this line=== ----- Message ----- From: Albertus Gordy HERO, MD Sent: 06/30/2023   3:52 PM EST To: Naomie LOISE Sharps, RN  Thanks Would repeat in 1 month CBC, ferritin + IBC JMP ----- Message ----- From: Sharps Naomie LOISE, RN Sent: 06/27/2023  10:21 AM EST To: Gordy HERO Albertus, MD  These are labs you requested be done around 07/02/23... Please review and advise if any additional recommendations at this time.

## 2023-07-10 ENCOUNTER — Ambulatory Visit: Payer: Self-pay | Admitting: Internal Medicine

## 2023-07-10 ENCOUNTER — Ambulatory Visit: Payer: PPO | Admitting: Nurse Practitioner

## 2023-07-10 DIAGNOSIS — R052 Subacute cough: Secondary | ICD-10-CM

## 2023-07-10 NOTE — Telephone Encounter (Signed)
Copied from CRM [id number]. Topic: Clinical - Red Word Triage >> Jul 10, 2023  3:31 PM Judeth Cornfield R wrote: Reason for CRM: Terrible cough with mucus - with chills and shortness of breath.  Chief Complaint: cough Symptoms: congestion, yellow/green sputum, runny nose Frequency: on and off Pertinent Negatives: Patient denies fever, cp Disposition: [] ED /[] Urgent Care (no appt availability in office) / [] Appointment(In office/virtual)/ []  [facility]/ [x] Home Care/ [] Refused Recommended Disposition /[]  Mobile Bus/ []  Follow-up with PCP Additional Notes: Patient wants PCP to know that this is her usual bad cough and would like an antibiotic called in.  Attempted to make apt, patient declined other providers, stating she is more comfortable with Dr. Okey Dupre.  Instructed to go to UC and patient declined.  Wants call back from office.    Reason for Disposition  Cough has been present for > 3 weeks  Answer Assessment - Initial Assessment Questions 1. ONSET: "When did the cough begin?"      Was in the hospital for a bleed prior to Christmas when cough started 2. SEVERITY: "How bad is the cough today?"      mild 3. SPUTUM: "Describe the color of your sputum" (none, dry cough; clear, white, yellow, green)     Yellow/green 4. HEMOPTYSIS: "Are you coughing up any blood?" If so ask: "How much?" (flecks, streaks, tablespoons, etc.)     denies 5. DIFFICULTY BREATHING: "Are you having difficulty breathing?" If Yes, ask: "How bad is it?" (e.g., mild, moderate, severe)    - MILD: No SOB at rest, mild SOB with walking, speaks normally in sentences, can lie down, no retractions, pulse < 100.    - MODERATE: SOB at rest, SOB with minimal exertion and prefers to sit, cannot lie down flat, speaks in phrases, mild retractions, audible wheezing, pulse 100-120.    - SEVERE: Very SOB at rest, speaks in single words, struggling to breathe, sitting hunched forward, retractions, pulse > 120       mild 6. FEVER: "Do you have a fever?" If Yes, ask: "What is your temperature, how was it measured, and when did it start?"     denies 7. CARDIAC HISTORY: "Do you have any history of heart disease?" (e.g., heart attack, congestive heart failure)      denies 8. LUNG HISTORY: "Do you have any history of lung disease?"  (e.g., pulmonary embolus, asthma, emphysema)     denies 9. PE RISK FACTORS: "Do you have a history of blood clots?" (or: recent major surgery, recent prolonged travel, bedridden)     denies 10. OTHER SYMPTOMS: "Do you have any other symptoms?" (e.g., runny nose, wheezing, chest pain)       A little burning in the chest.  Protocols used: Cough - Acute Productive-A-AH

## 2023-07-11 NOTE — Addendum Note (Signed)
Addended by: Hillard Danker A on: 07/11/2023 10:46 AM   Modules accepted: Orders

## 2023-07-11 NOTE — Telephone Encounter (Signed)
Have ordered chest x-ray she can come do. If no pneumonia there likely does not need antibiotics

## 2023-07-15 ENCOUNTER — Encounter: Payer: Self-pay | Admitting: Internal Medicine

## 2023-07-15 ENCOUNTER — Ambulatory Visit (INDEPENDENT_AMBULATORY_CARE_PROVIDER_SITE_OTHER): Payer: PPO | Admitting: Internal Medicine

## 2023-07-15 VITALS — BP 160/88 | HR 88 | Temp 97.9°F | Ht 66.0 in | Wt 206.2 lb

## 2023-07-15 DIAGNOSIS — D62 Acute posthemorrhagic anemia: Secondary | ICD-10-CM

## 2023-07-15 DIAGNOSIS — J189 Pneumonia, unspecified organism: Secondary | ICD-10-CM | POA: Diagnosis not present

## 2023-07-15 MED ORDER — AZITHROMYCIN 250 MG PO TABS: ORAL_TABLET | ORAL | 0 refills | Status: AC

## 2023-07-15 NOTE — Assessment & Plan Note (Addendum)
Suspected given 2-3 weeks of symptoms and no improvement. Rx azithromycin and if not improving needs CXR this is already ordered. No true wheezing in lungs so will avoid prednisone (she does not tolerate well).

## 2023-07-15 NOTE — Progress Notes (Signed)
   Subjective:   Patient ID: Angela Ramirez, female    DOB: 02/09/1946, 78 y.o.   MRN: [id number]  HPI The patient is a 78 YO female coming in for cough and SOB for 2-3 weeks. Some mild wheezing using xopenex and this helps very temporarily. Not worsening but not improving.   Review of Systems  Constitutional:  Positive for activity change and appetite change. Negative for chills, fatigue, fever and unexpected weight change.  HENT:  Positive for congestion, postnasal drip, rhinorrhea and sinus pressure. Negative for ear discharge, ear pain, sinus pain, sneezing, sore throat, tinnitus, trouble swallowing and voice change.   Eyes: Negative.   Respiratory:  Positive for cough and wheezing. Negative for chest tightness and shortness of breath.   Cardiovascular: Negative.   Gastrointestinal: Negative.   Musculoskeletal:  Positive for myalgias.  Neurological: Negative.     Objective:  Physical Exam Constitutional:      Appearance: She is well-developed.  HENT:     Head: Normocephalic and atraumatic.  Cardiovascular:     Rate and Rhythm: Normal rate and regular rhythm.  Pulmonary:     Effort: Pulmonary effort is normal. No respiratory distress.     Breath sounds: Normal breath sounds. No wheezing or rales.     Comments: Some upper airway wheezing heard on breathing but not detected in the lungs Abdominal:     General: Bowel sounds are normal. There is no distension.     Palpations: Abdomen is soft.     Tenderness: There is no abdominal tenderness. There is no rebound.  Musculoskeletal:     Cervical back: Normal range of motion.  Skin:    General: Skin is warm and dry.  Neurological:     Mental Status: She is alert and oriented to person, place, and time.     Coordination: Coordination normal.     Vitals:   07/15/23 1304  BP: (!) 160/88  Pulse: 88  Temp: 97.9 F (36.6 C)  TempSrc: Oral  SpO2: 96%  Weight: 206 lb 3.2 oz (93.5 kg)  Height: 5\' 6"  (1.676 m)    Assessment  & Plan:

## 2023-07-15 NOTE — Patient Instructions (Signed)
We have sent in the azithromycin to take 2 pills on day 1. Then starting day 2 take 1 pill daily until gone.

## 2023-07-15 NOTE — Assessment & Plan Note (Signed)
Recheck CBC today. No further clinical signs of bleeding since last visit.
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
## 2023-07-28 ENCOUNTER — Telehealth: Payer: Self-pay | Admitting: *Deleted

## 2023-07-28 ENCOUNTER — Ambulatory Visit: Payer: PPO | Admitting: Podiatry

## 2023-07-28 ENCOUNTER — Encounter: Payer: Self-pay | Admitting: Podiatry

## 2023-07-28 DIAGNOSIS — M2142 Flat foot [pes planus] (acquired), left foot: Secondary | ICD-10-CM

## 2023-07-28 DIAGNOSIS — M19072 Primary osteoarthritis, left ankle and foot: Secondary | ICD-10-CM

## 2023-07-28 MED ORDER — BETAMETHASONE SOD PHOS & ACET 6 (3-3) MG/ML IJ SUSP
3.0000 mg | Freq: Once | INTRAMUSCULAR | Status: AC
  Administered 2023-07-28: 3 mg via INTRA_ARTICULAR

## 2023-07-28 NOTE — Progress Notes (Addendum)
Chief Complaint  Patient presents with   Foot Pain    Patient states she is having foot pain still and would like another injection for pain because it worked so good the last time patient states.    HPI: 78 y.o. female presenting today for follow-up evaluation of left midfoot pain.  Patient states that the injection last visit helped significantly.  Over the last few months the pain has slowly returned.  She does have a history of arthritis.  Requesting another injection today  History of bunion and midfoot exostectomy surgery left.  She says the surgical areas are doing well. She also has right knee arthritis and she feels that she is compensating and applying extra pressure on her left lower extremity.  Past Medical History:  Diagnosis Date   Anxiety    Aortic atherosclerosis (HCC)    Arnold-Chiari malformation (HCC)    Arthritis    Cirrhosis (HCC)    Colon polyps    Depression    Diverticulitis    Diverticulosis    Emphysema of lung (HCC) 12/02/2020   per CT scan   Headache    Hepatitis C    Hiatal hernia    Hypertension    Hypertensive retinopathy    IBS (irritable bowel syndrome)    Internal hemorrhoids    Sepsis (HCC) 11/2020   UTI (urinary tract infection)     Past Surgical History:  Procedure Laterality Date   BRAIN SURGERY  2003   decompression   BREAST EXCISIONAL BIOPSY Bilateral    age 18's   CATARACT EXTRACTION     DILATION AND CURETTAGE OF UTERUS     ESOPHAGOGASTRODUODENOSCOPY (EGD) WITH PROPOFOL N/A 06/15/2023   Procedure: ESOPHAGOGASTRODUODENOSCOPY (EGD) WITH PROPOFOL;  Surgeon: Beverley Fiedler, MD;  Location: [facility] ENDOSCOPY;  Service: Gastroenterology;  Laterality: N/A;   EYE SURGERY     OVARIAN CYST REMOVAL Bilateral    REDUCTION MAMMAPLASTY Bilateral    25+ years ago    Allergies  Allergen Reactions   Crab (Diagnostic) Hives   Morphine And Codeine Nausea And Vomiting   Sulfa Antibiotics Nausea And Vomiting     Physical Exam: General: The  patient is alert and oriented x3 in no acute distress.  Dermatology: Skin is warm, dry and supple bilateral lower extremities.   Vascular: Palpable pedal pulses bilaterally. Capillary refill within normal limits.  No appreciable edema.  No erythema.  Neurological: Grossly intact via light touch  Musculoskeletal Exam: No pedal deformities noted.  Tenderness with palpation noted around the fourth and fifth TMT of the left midfoot consistent with degenerative changes and arthritis.  With weightbearing there is collapse of medial longitudinal arch of the foot which seems to exacerbate some pressure along the lateral column of the foot with gait and ambulation.  This may be contributing to some of her lateral column foot pain  Radiographic Exam LT foot 01/29/2023:  Prior history of bunion surgery with 2 orthopedic screws within the distal portion of the first metatarsal which appear stable.  Advanced severe degenerative changes noted throughout the TMT joint and midtarsal joints.  Impression: Advanced severe DJD/arthritis left midfoot  Assessment/Plan of Care: 1.  Arthritis/DJD left midfoot 2.  Pes planovalgus deformity bilateral  -Patient evaluated.  X-rays reviewed that were taken 01/29/2023 -Injection of 0.5 cc Celestone Soluspan injected around the fourth and fifth TMT of the left foot -Advise against going barefoot.  Recommend good supportive tennis shoes and sneakers -Also recommend arch supports to support the medial  longitudinal arch of the foot and potentially alleviate pressure from the lateral column of the foot.  Recommend OTC prefabricated insoles from [employer] -Patient has had custom orthotics in the past but does not find them comfortable -Patient unable to take NSAIDs.  History of diverticulitis and GI bleed -Return to clinic as needed     Felecia Shelling, DPM [facility]  Dr. Felecia Shelling, DPM    [address] [place], [place] [address]                Office [phone]  Fax [phone]

## 2023-07-28 NOTE — Telephone Encounter (Signed)
Contacted patient to advise she is due for repeat labwork with our office. She states she will come Wednesday to have these labs drawn.

## 2023-07-28 NOTE — Telephone Encounter (Signed)
-----   Message from Nurse Deno Etienne sent at 07/01/2023  8:42 AM EST ----- Pt needs repeat labs around 08/01/23; orders already in epic, call and remind pt; pyrtle patient ----- Message ----- From: Beverley Fiedler, MD Sent: 06/30/2023   3:52 PM EST To: Richardson Chiquito, RN  Thanks Would repeat in 1 month CBC, ferritin + IBC JMP ----- Message ----- From: Richardson Chiquito, RN Sent: 06/27/2023  10:21 AM EST To: Beverley Fiedler, MD  These are labs you requested be done around 07/02/23... Please review and advise if any additional recommendations at this time.

## 2023-07-29 ENCOUNTER — Encounter (INDEPENDENT_AMBULATORY_CARE_PROVIDER_SITE_OTHER): Payer: Self-pay | Admitting: Ophthalmology

## 2023-07-29 ENCOUNTER — Ambulatory Visit (INDEPENDENT_AMBULATORY_CARE_PROVIDER_SITE_OTHER): Payer: PPO | Admitting: Ophthalmology

## 2023-07-29 DIAGNOSIS — H353221 Exudative age-related macular degeneration, left eye, with active choroidal neovascularization: Secondary | ICD-10-CM

## 2023-07-29 DIAGNOSIS — I10 Essential (primary) hypertension: Secondary | ICD-10-CM

## 2023-07-29 DIAGNOSIS — H442E3 Degenerative myopia with other maculopathy, bilateral eye: Secondary | ICD-10-CM

## 2023-07-29 DIAGNOSIS — H5213 Myopia, bilateral: Secondary | ICD-10-CM

## 2023-07-29 DIAGNOSIS — Z961 Presence of intraocular lens: Secondary | ICD-10-CM

## 2023-07-29 DIAGNOSIS — H4312 Vitreous hemorrhage, left eye: Secondary | ICD-10-CM

## 2023-07-29 DIAGNOSIS — H35033 Hypertensive retinopathy, bilateral: Secondary | ICD-10-CM

## 2023-07-29 DIAGNOSIS — H43813 Vitreous degeneration, bilateral: Secondary | ICD-10-CM | POA: Diagnosis not present

## 2023-07-29 MED ORDER — BEVACIZUMAB CHEMO INJECTION 1.25MG/0.05ML SYRINGE FOR KALEIDOSCOPE
1.2500 mg | INTRAVITREAL | Status: AC | PRN
  Administered 2023-07-29: 1.25 mg via INTRAVITREAL

## 2023-07-30 ENCOUNTER — Other Ambulatory Visit: Payer: Self-pay

## 2023-07-30 ENCOUNTER — Other Ambulatory Visit: Payer: PPO

## 2023-07-30 DIAGNOSIS — K2990 Gastroduodenitis, unspecified, without bleeding: Secondary | ICD-10-CM | POA: Diagnosis not present

## 2023-07-30 DIAGNOSIS — K2970 Gastritis, unspecified, without bleeding: Secondary | ICD-10-CM

## 2023-07-30 DIAGNOSIS — Z8719 Personal history of other diseases of the digestive system: Secondary | ICD-10-CM

## 2023-07-30 DIAGNOSIS — K7460 Unspecified cirrhosis of liver: Secondary | ICD-10-CM

## 2023-07-30 LAB — CBC WITH DIFFERENTIAL/PLATELET
Basophils Absolute: 0.1 10*3/uL (ref 0.0–0.1)
Basophils Relative: 0.8 % (ref 0.0–3.0)
Eosinophils Absolute: 0.1 10*3/uL (ref 0.0–0.7)
Eosinophils Relative: 1.2 % (ref 0.0–5.0)
HCT: 37.8 % (ref 36.0–46.0)
Hemoglobin: 12.1 g/dL (ref 12.0–15.0)
Lymphocytes Relative: 29.9 % (ref 12.0–46.0)
Lymphs Abs: 2.6 10*3/uL (ref 0.7–4.0)
MCHC: 32.1 g/dL (ref 30.0–36.0)
MCV: 86.1 fL (ref 78.0–100.0)
Monocytes Absolute: 0.5 10*3/uL (ref 0.1–1.0)
Monocytes Relative: 6.2 % (ref 3.0–12.0)
Neutro Abs: 5.3 10*3/uL (ref 1.4–7.7)
Neutrophils Relative %: 61.9 % (ref 43.0–77.0)
Platelets: 345 10*3/uL (ref 150.0–400.0)
RBC: 4.38 Mil/uL (ref 3.87–5.11)
RDW: 15.3 % (ref 11.5–15.5)
WBC: 8.6 10*3/uL (ref 4.0–10.5)

## 2023-07-30 LAB — IBC + FERRITIN
Ferritin: 6.6 ng/mL — ABNORMAL LOW (ref 10.0–291.0)
Iron: 41 ug/dL — ABNORMAL LOW (ref 42–145)
Saturation Ratios: 10.3 % — ABNORMAL LOW (ref 20.0–50.0)
TIBC: 399 ug/dL (ref 250.0–450.0)
Transferrin: 285 mg/dL (ref 212.0–360.0)

## 2023-07-30 LAB — FERRITIN: Ferritin: 6.6 ng/mL — ABNORMAL LOW (ref 10.0–291.0)

## 2023-07-30 MED ORDER — INTEGRA 62.5-62.5-40-3 MG PO CAPS: ORAL_CAPSULE | ORAL | 2 refills | Status: AC

## 2023-07-30 NOTE — Progress Notes (Signed)
 Angela Ramirez

## 2023-08-04 ENCOUNTER — Ambulatory Visit: Payer: PPO | Admitting: Podiatry

## 2023-08-26 ENCOUNTER — Other Ambulatory Visit: Payer: Self-pay | Admitting: Internal Medicine

## 2023-09-21 ENCOUNTER — Other Ambulatory Visit: Payer: Self-pay | Admitting: Internal Medicine

## 2023-09-23 ENCOUNTER — Encounter (INDEPENDENT_AMBULATORY_CARE_PROVIDER_SITE_OTHER): Payer: PPO | Admitting: Ophthalmology

## 2023-09-23 DIAGNOSIS — H532 Diplopia: Secondary | ICD-10-CM

## 2023-09-23 DIAGNOSIS — I10 Essential (primary) hypertension: Secondary | ICD-10-CM

## 2023-09-23 DIAGNOSIS — H35033 Hypertensive retinopathy, bilateral: Secondary | ICD-10-CM

## 2023-09-23 DIAGNOSIS — H4312 Vitreous hemorrhage, left eye: Secondary | ICD-10-CM

## 2023-09-23 DIAGNOSIS — H5213 Myopia, bilateral: Secondary | ICD-10-CM

## 2023-09-23 DIAGNOSIS — H353221 Exudative age-related macular degeneration, left eye, with active choroidal neovascularization: Secondary | ICD-10-CM

## 2023-09-23 DIAGNOSIS — H442E3 Degenerative myopia with other maculopathy, bilateral eye: Secondary | ICD-10-CM

## 2023-09-23 DIAGNOSIS — H43813 Vitreous degeneration, bilateral: Secondary | ICD-10-CM

## 2023-09-23 DIAGNOSIS — Z961 Presence of intraocular lens: Secondary | ICD-10-CM

## 2023-09-30 ENCOUNTER — Encounter (INDEPENDENT_AMBULATORY_CARE_PROVIDER_SITE_OTHER): Admitting: Ophthalmology

## 2023-09-30 DIAGNOSIS — H5213 Myopia, bilateral: Secondary | ICD-10-CM

## 2023-09-30 DIAGNOSIS — H442E3 Degenerative myopia with other maculopathy, bilateral eye: Secondary | ICD-10-CM

## 2023-09-30 DIAGNOSIS — H43813 Vitreous degeneration, bilateral: Secondary | ICD-10-CM

## 2023-09-30 DIAGNOSIS — H4312 Vitreous hemorrhage, left eye: Secondary | ICD-10-CM

## 2023-09-30 DIAGNOSIS — I10 Essential (primary) hypertension: Secondary | ICD-10-CM

## 2023-09-30 DIAGNOSIS — Z961 Presence of intraocular lens: Secondary | ICD-10-CM

## 2023-09-30 DIAGNOSIS — H35033 Hypertensive retinopathy, bilateral: Secondary | ICD-10-CM

## 2023-09-30 DIAGNOSIS — H532 Diplopia: Secondary | ICD-10-CM

## 2023-09-30 DIAGNOSIS — H353221 Exudative age-related macular degeneration, left eye, with active choroidal neovascularization: Secondary | ICD-10-CM
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
## 2023-10-06 ENCOUNTER — Encounter (INDEPENDENT_AMBULATORY_CARE_PROVIDER_SITE_OTHER): Payer: Self-pay | Admitting: Ophthalmology

## 2023-10-06 ENCOUNTER — Ambulatory Visit (INDEPENDENT_AMBULATORY_CARE_PROVIDER_SITE_OTHER): Admitting: Ophthalmology

## 2023-10-06 DIAGNOSIS — H353221 Exudative age-related macular degeneration, left eye, with active choroidal neovascularization: Secondary | ICD-10-CM

## 2023-10-06 DIAGNOSIS — Z961 Presence of intraocular lens: Secondary | ICD-10-CM | POA: Diagnosis not present

## 2023-10-06 DIAGNOSIS — H4312 Vitreous hemorrhage, left eye: Secondary | ICD-10-CM

## 2023-10-06 DIAGNOSIS — H35033 Hypertensive retinopathy, bilateral: Secondary | ICD-10-CM

## 2023-10-06 DIAGNOSIS — H5213 Myopia, bilateral: Secondary | ICD-10-CM

## 2023-10-06 DIAGNOSIS — H442E3 Degenerative myopia with other maculopathy, bilateral eye: Secondary | ICD-10-CM | POA: Diagnosis not present

## 2023-10-06 DIAGNOSIS — H532 Diplopia: Secondary | ICD-10-CM

## 2023-10-06 DIAGNOSIS — I10 Essential (primary) hypertension: Secondary | ICD-10-CM | POA: Diagnosis not present

## 2023-10-06 DIAGNOSIS — H43813 Vitreous degeneration, bilateral: Secondary | ICD-10-CM | POA: Diagnosis not present

## 2023-10-06 MED ORDER — BEVACIZUMAB CHEMO INJECTION 1.25MG/0.05ML SYRINGE FOR KALEIDOSCOPE
1.2500 mg | INTRAVITREAL | Status: AC | PRN
Start: 2023-10-06 — End: 2023-10-06
  Administered 2023-10-06: 1.25 mg via INTRAVITREAL

## 2023-10-28 ENCOUNTER — Encounter: Payer: Self-pay | Admitting: *Deleted

## 2023-10-29 ENCOUNTER — Other Ambulatory Visit (INDEPENDENT_AMBULATORY_CARE_PROVIDER_SITE_OTHER)

## 2023-10-29 DIAGNOSIS — Z8719 Personal history of other diseases of the digestive system: Secondary | ICD-10-CM | POA: Diagnosis not present

## 2023-10-29 LAB — CBC WITH DIFFERENTIAL/PLATELET
Basophils Absolute: 0.1 10*3/uL (ref 0.0–0.1)
Basophils Relative: 1.2 % (ref 0.0–3.0)
Eosinophils Absolute: 0.2 10*3/uL (ref 0.0–0.7)
Eosinophils Relative: 2.5 % (ref 0.0–5.0)
HCT: 38.4 % (ref 36.0–46.0)
Hemoglobin: 12.7 g/dL (ref 12.0–15.0)
Lymphocytes Relative: 22.8 % (ref 12.0–46.0)
Lymphs Abs: 1.7 10*3/uL (ref 0.7–4.0)
MCHC: 33 g/dL (ref 30.0–36.0)
MCV: 83.5 fl (ref 78.0–100.0)
Monocytes Absolute: 0.5 10*3/uL (ref 0.1–1.0)
Monocytes Relative: 6.2 % (ref 3.0–12.0)
Neutro Abs: 5.1 10*3/uL (ref 1.4–7.7)
Neutrophils Relative %: 67.3 % (ref 43.0–77.0)
Platelets: 264 10*3/uL (ref 150.0–400.0)
RBC: 4.61 Mil/uL (ref 3.87–5.11)
RDW: 17 % — ABNORMAL HIGH (ref 11.5–15.5)
WBC: 7.6 10*3/uL (ref 4.0–10.5)

## 2023-10-29 LAB — IBC + FERRITIN
Ferritin: 19.3 ng/mL (ref 10.0–291.0)
Iron: 53 ug/dL (ref 42–145)
Saturation Ratios: 14.5 % — ABNORMAL LOW (ref 20.0–50.0)
TIBC: 365.4 ug/dL (ref 250.0–450.0)
Transferrin: 261 mg/dL (ref 212.0–360.0)

## 2023-10-30 ENCOUNTER — Encounter: Payer: Self-pay | Admitting: Internal Medicine

## 2023-10-30 ENCOUNTER — Other Ambulatory Visit: Payer: Self-pay | Admitting: *Deleted

## 2023-10-30 DIAGNOSIS — D62 Acute posthemorrhagic anemia: Secondary | ICD-10-CM

## 2023-10-30 DIAGNOSIS — Z8719 Personal history of other diseases of the digestive system: Secondary | ICD-10-CM

## 2023-11-04 ENCOUNTER — Ambulatory Visit: Payer: Self-pay
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
## 2023-11-05 ENCOUNTER — Encounter (HOSPITAL_COMMUNITY): Payer: Self-pay

## 2023-11-05 ENCOUNTER — Other Ambulatory Visit: Payer: Self-pay

## 2023-11-05 ENCOUNTER — Ambulatory Visit (HOSPITAL_COMMUNITY)
Admission: RE | Admit: 2023-11-05 | Discharge: 2023-11-05 | Disposition: A | Discharge status: Home / Self Care | Payer: Self-pay | Source: Ambulatory Visit | Attending: Nurse Practitioner | Admitting: Nurse Practitioner

## 2023-11-05 VITALS — BP 192/85 | HR 87 | Temp 98.2°F | Resp 18

## 2023-11-05 DIAGNOSIS — I10 Essential (primary) hypertension: Secondary | ICD-10-CM | POA: Insufficient documentation

## 2023-11-05 DIAGNOSIS — N3001 Acute cystitis with hematuria: Secondary | ICD-10-CM | POA: Diagnosis not present

## 2023-11-05 LAB — POCT URINALYSIS DIP (MANUAL ENTRY)
Bilirubin, UA: NEGATIVE
Glucose, UA: NEGATIVE mg/dL
Ketones, POC UA: NEGATIVE mg/dL
Nitrite, UA: NEGATIVE
Protein Ur, POC: NEGATIVE mg/dL
Spec Grav, UA: 1.01 (ref 1.010–1.025)
Urobilinogen, UA: 0.2 U/dL
pH, UA: 6 (ref 5.0–8.0)

## 2023-11-05 MED ORDER — CEPHALEXIN 500 MG PO CAPS: 500.0000 mg | ORAL_CAPSULE | Freq: Two times a day (BID) | ORAL | 0 refills | Status: AC

## 2023-11-05 NOTE — ED Triage Notes (Signed)
 Pt reports burning sensation with urination and frequent urination for the past 3 days.

## 2023-11-05 NOTE — ED Provider Notes (Signed)
 [facility]    CSN: [id number] Arrival date & time: 11/05/23  1013      History   Chief Complaint Chief Complaint  Patient presents with   Urinary Frequency    Entered by patient   Dysuria    HPI Angela Ramirez is a 78 y.o. female.   Patient presents today for 3 to 4-day history of dysuria, urinary frequency and urgency, voiding smaller amounts, suprapubic pressure, and midline low back pain.  She denies new urinary incontinence, foul urinary odor, hematuria, flank pain, fever, nausea/vomiting, and vaginal discharge.  Reports history of UTI a few years ago that resulted in her being hospitalized for sepsis in ICU.  She has taken Azo and has tried drinking apple juice and cranberry juice for symptoms without much improvement.  Patient acknowledges elevated blood pressure today in urgent care.  Reports history of "severe" whitecoat syndrome.  Also reports he is not taking her "fluid pill" today so far.  Reports she ate foods high in sodium last night as well.  She is able to check her blood pressure at home.  She denies chest pain, shortness of breath, blurred or double vision, headache, dizziness/lightheadedness, and lower extremity swelling today.    Past Medical History:  Diagnosis Date   Anxiety    Aortic atherosclerosis (HCC)    Arnold-Chiari malformation (HCC)    Arthritis    Cirrhosis (HCC)    Colon polyps    Depression    Diverticulitis    Diverticulosis    Emphysema of lung (HCC) 12/02/2020   per CT scan   Headache    Hepatitis C    Hiatal hernia    Hypertension    Hypertensive retinopathy    IBS (irritable bowel syndrome)    Internal hemorrhoids    Sepsis (HCC) 11/2020   UTI (urinary tract infection)     Patient Active Problem List   Diagnosis Date Noted   CAP (community acquired pneumonia) 07/15/2023   Nonerosive nonspecific gastritis 06/15/2023   Melena 06/14/2023   Rectal bleeding 06/13/2023   Hemorrhage of large intestine due to  diverticular disease 06/13/2023   Acute blood loss anemia 06/13/2023   Renal artery stenosis (HCC) 06/13/2023   Obesity (BMI 30-39.9) 06/13/2023   Urinary tract infection 06/13/2023   Obesity (BMI 30.0-34.9) 01/17/2023   Chronic pain of both knees 05/13/2022   Weakness acquired in ICU 01/26/2021   Left foot pain 07/14/2020   Situational anxiety 07/14/2020   Allergic rhinitis 02/11/2020   Vertigo 02/11/2020   Constipation 08/20/2019   GERD (gastroesophageal reflux disease) 02/19/2019   Low back pain 02/20/2018   Liver fibrosis 10/09/2017   Hep C w/o coma, chronic (HCC) 08/26/2017   Routine general medical examination at a health care facility 08/18/2017   Right shoulder pain 08/18/2017   Essential hypertension 09/21/2015   Arnold-Chiari malformation (HCC) 09/21/2015   Insomnia 09/21/2015   PTSD (post-traumatic stress disorder) 09/21/2015    Past Surgical History:  Procedure Laterality Date   BRAIN SURGERY  2003   decompression   BREAST EXCISIONAL BIOPSY Bilateral    age 89's   CATARACT EXTRACTION     DILATION AND CURETTAGE OF UTERUS     ESOPHAGOGASTRODUODENOSCOPY (EGD) WITH PROPOFOL  N/A 06/15/2023   Procedure: ESOPHAGOGASTRODUODENOSCOPY (EGD) WITH PROPOFOL ;  Surgeon: Nannette Babe, MD;  Location: [facility] ENDOSCOPY;  Service: Gastroenterology;  Laterality: N/A;   EYE SURGERY     OVARIAN CYST REMOVAL Bilateral    REDUCTION MAMMAPLASTY Bilateral  25+ years ago    OB History   No obstetric history on file.      Home Medications    Prior to Admission medications   Medication Sig Start Date End Date Taking? Authorizing Provider  cephALEXin (KEFLEX) 500 MG capsule Take 1 capsule (500 mg total) by mouth 2 (two) times daily for 5 days. 11/05/23 11/10/23 Yes Wilhemena Harbour, NP  cetirizine (ZYRTEC) 10 MG tablet Take 10 mg by mouth daily as needed for allergies.    [provider]  Fe Fum-FePoly-Vit C-Vit B3 (INTEGRA) 62.5-62.5-40-3 MG CAPS Take 1 cap daily for 3  months 07/30/23   Pyrtle, Amber Bail, MD  fluconazole  (DIFLUCAN ) 150 MG tablet Take 1 tablet (150 mg total) by mouth every 3 (three) days. 06/23/23   Adelia Homestead, MD  hydrochlorothiazide  (HYDRODIURIL ) 25 MG tablet Take 1 tablet (25 mg total) by mouth daily. 01/27/23   Adelia Homestead, MD  levalbuterol  (XOPENEX  HFA) 45 MCG/ACT inhaler Inhale 1-2 puffs into the lungs every 6 (six) hours as needed for wheezing or shortness of breath. Patient taking differently: Inhale 2 puffs into the lungs every 6 (six) hours as needed for wheezing or shortness of breath. 08/12/22   Zorita Hiss, FNP  linaclotide  (LINZESS ) 72 MCG capsule Take 1 capsule (72 mcg total) by mouth daily before breakfast. Patient taking differently: Take 72 mcg by mouth daily as needed (constipation). 01/27/23   Adelia Homestead, MD  meclizine  (ANTIVERT ) 12.5 MG tablet Take 1 tablet (12.5 mg total) by mouth 3 (three) times daily as needed for dizziness. 01/27/23   Adelia Homestead, MD  pantoprazole  (PROTONIX ) 40 MG tablet Take 1 tablet (40 mg total) by mouth daily before breakfast. 06/15/23   Lorita Rosa, MD  temazepam  (RESTORIL ) 15 MG capsule Take 1 capsule (15 mg total) by mouth at bedtime as needed for sleep. 06/23/23   Adelia Homestead, MD  terconazole  (TERAZOL 3 ) 0.8 % vaginal cream Place 1 applicator vaginally at bedtime. 06/23/23   Adelia Homestead, MD  tiZANidine  (ZANAFLEX ) 2 MG tablet TAKE 1 TABLET(2 MG) BY MOUTH EVERY 8 HOURS AS NEEDED FOR MUSCLE SPASMS 09/23/23   Adelia Homestead, MD  traMADol  (ULTRAM ) 50 MG tablet Take 1 tablet (50 mg total) by mouth every 12 (twelve) hours as needed. 08/28/23   Adelia Homestead, MD    Family History Family History  Problem Relation Age of Onset   Arthritis Mother    Hypertension Mother    Kidney disease Mother    Diabetes Mother    Alcohol abuse Father    Arthritis Father    Lung cancer Father    Hypertension Sister    Rheum arthritis Sister     Diabetes Maternal Grandmother    Breast cancer Paternal Grandmother    Hypertension Daughter    Parkinson's disease Son    Diabetes Maternal Aunt    Kidney failure Maternal Aunt    Diabetes Maternal Uncle    Breast cancer Paternal Aunt    Breast cancer Paternal Aunt    Colon cancer Neg Hx    Stomach cancer Neg Hx    Esophageal cancer Neg Hx     Social History Social History   Tobacco Use   Smoking status: Former    Current packs/day: 0.00    Types: Cigarettes    Quit date: 09/29/1969    Years since quitting: 54.1   Smokeless tobacco: Never  Vaping Use   Vaping status: Never Used  Substance Use Topics   Alcohol use: Not Currently    Comment: 2 glasses of wine a month   Drug use: No     Allergies   Crab (diagnostic), Morphine and codeine, and Sulfa antibiotics   Review of Systems Review of Systems Per HPI  Physical Exam Triage Vital Signs ED Triage Vitals  Encounter Vitals Group     BP 11/05/23 1023 (!) 192/85     Systolic BP Percentile --      Diastolic BP Percentile --      Pulse Rate 11/05/23 1023 87     Resp 11/05/23 1023 18     Temp 11/05/23 1023 98.2 F (36.8 C)     Temp Source 11/05/23 1023 Oral     SpO2 11/05/23 1023 98 %     Weight --      Height --      Head Circumference --      Peak Flow --      Pain Score 11/05/23 1022 0     Pain Loc --      Pain Education --      Exclude from Growth Chart --    No data found.  Updated Vital Signs BP (!) 192/85 (BP Location: Right Arm)   Pulse 87   Temp 98.2 F (36.8 C) (Oral)   Resp 18   SpO2 98%   Visual Acuity Right Eye Distance:   Left Eye Distance:   Bilateral Distance:    Right Eye Near:   Left Eye Near:    Bilateral Near:     Physical Exam Vitals and nursing note reviewed.  Constitutional:      General: She is not in acute distress.    Appearance: She is not toxic-appearing.  Cardiovascular:     Rate and Rhythm: Normal rate.  Pulmonary:     Effort: Pulmonary effort is normal. No  respiratory distress.  Abdominal:     General: Abdomen is flat. Bowel sounds are normal. There is no distension.     Palpations: Abdomen is soft. There is no mass.     Tenderness: There is no abdominal tenderness. There is no right CVA tenderness, left CVA tenderness or guarding.  Skin:    General: Skin is warm and dry.     Coloration: Skin is not jaundiced or pale.     Findings: No erythema.  Neurological:     Mental Status: She is alert and oriented to person, place, and time.     Motor: No weakness.     Gait: Gait normal.  Psychiatric:        Behavior: Behavior is cooperative.      UC Treatments / Results  Labs (all labs ordered are listed, but only abnormal results are displayed) Labs Reviewed  POCT URINALYSIS DIP (MANUAL ENTRY) - Abnormal; Notable for the following components:      Result Value   Clarity, UA cloudy (*)    Blood, UA trace-intact (*)    Leukocytes, UA Large (3+) (*)    All other components within normal limits  URINE CULTURE    EKG   Radiology No results found.  Procedures Procedures (including critical care time)  Medications Ordered in UC Medications - No data to display  Initial Impression / Assessment and Plan / UC Course  I have reviewed the triage vital signs and the nursing notes.  Pertinent labs & imaging results that were available during my care of the patient were reviewed by me and considered  in my medical decision making (see chart for details).   In triage, patient is hypertensive, otherwise vital signs are stable.  1. Acute cystitis with hematuria Urinalysis today is cloudy with trace intact blood, large leukocyte Estrace Urine culture is pending Will treat for acute UTI with Keflex 500 mg twice daily for 5 days Recommended increasing hydration with water, avoidance of sugary beverages Strict ER precautions discussed with patient  2. Elevated blood pressure reading in office with diagnosis of hypertension Recommended  resuming diuretic upon returning home, checking blood pressure and notifying PCP if elevated more than normal for patient Strict ER precautions discussed  The patient was given the opportunity to ask questions.  All questions answered to their satisfaction.  The patient is in agreement to this plan.   Final Clinical Impressions(s) / UC Diagnoses   Final diagnoses:  Acute cystitis with hematuria  Elevated blood pressure reading in office with diagnosis of hypertension   Discharge Instructions      Take the Keflex twice daily for 5 days as prescribed to treat the UTI.  Will contact you later this week if the urine culture shows we need to change the antibiotic.  In the meantime, recommend hydration with plenty of water.  Seek care in ER if you develop fever, nausea/vomiting, unable to eat or drink due to vomiting.  Your blood pressure is elevated today.  Please take your fluid pill when you get home and check your blood pressure a few hours later.  Notify your primary care provider if greater than 140/90.  ED Prescriptions     Medication Sig Dispense Auth. Provider   cephALEXin (KEFLEX) 500 MG capsule Take 1 capsule (500 mg total) by mouth 2 (two) times daily for 5 days. 10 capsule Wilhemena Harbour, NP      PDMP not reviewed this encounter.   Wilhemena Harbour, NP 11/05/23 [phone]

## 2023-11-05 NOTE — Discharge Instructions (Addendum)
 Take the Keflex twice daily for 5 days as prescribed to treat the UTI.  Will contact you later this week if the urine culture shows we need to change the antibiotic.  In the meantime, recommend hydration with plenty of water.  Seek care in ER if you develop fever, nausea/vomiting, unable to eat or drink due to vomiting.  Your blood pressure is elevated today.  Please take your fluid pill when you get home and check your blood pressure a few hours later.  Notify your primary care provider if greater than 140/90.

## 2023-11-07 ENCOUNTER — Ambulatory Visit (HOSPITAL_COMMUNITY): Payer: Self-pay

## 2023-11-07 LAB — URINE CULTURE: Culture: >=100000 — AB

## 2023-12-02 ENCOUNTER — Ambulatory Visit: Admitting: Sports Medicine

## 2023-12-02 VITALS — HR 103 | Ht 66.0 in | Wt 206.0 lb

## 2023-12-02 DIAGNOSIS — M170 Bilateral primary osteoarthritis of knee: Secondary | ICD-10-CM | POA: Diagnosis not present

## 2023-12-02 DIAGNOSIS — M25561 Pain in right knee: Secondary | ICD-10-CM

## 2023-12-02 DIAGNOSIS — M25562 Pain in left knee: Secondary | ICD-10-CM

## 2023-12-02 DIAGNOSIS — G8929 Other chronic pain: Secondary | ICD-10-CM

## 2023-12-02 NOTE — Progress Notes (Signed)
 Ben Divonte Senger D.[facility] [address] [place] [address] Phone: [phone]   Assessment and Plan:     1. Bilateral primary osteoarthritis of knee 2. Chronic pain of both knees  With exacerbation, subsequent visit - Recurrence of bilateral knee pain consistent with flare of moderate to severe osteoarthritis - Patient had GI bleed in December 2024, so we will continue to avoid NSAIDs - Recommend Tylenol  for day-to-day pain relief - Patient elected for bilateral intra-articular CSI's.  Tolerated well per note below  CSI may temporarily increase blood pressure in patient with past medical history of hypertension - Continue HEP and activity as tolerated  Procedure: Knee Joint Injection Side: Bilateral Indication: Flare of osteoarthritis  Risks explained and consent was given verbally. The site was cleaned with alcohol prep. A needle was introduced with an anterio-lateral approach. Injection given using 2mL of 1% lidocaine  without epinephrine and 1mL of kenalog 40mg /ml. This was well tolerated and resulted in symptomatic relief.  Needle was removed, hemostasis achieved, and post injection instructions were explained.  Procedure was repeated on contralateral side.  Pt was advised to call or return to clinic if these symptoms worsen or fail to improve as anticipated.   Pertinent previous records reviewed include none  Follow Up: 3 to 4 weeks for reevaluation.  Could discuss alternative CSI versus prednisone  course if no improvement or worsening of symptoms.   Subjective:   I, Audie Bleacher, am serving as a Neurosurgeon for Doctor Ulysees Gander  Chief Complaint: bilateral knee pain    HPI:    05/21/2022 Patient is a 78 year old female complaining of bilateral knee pain. patient states that she is having pain in her thighs when she walks , notes weakness in he knee when she stands, been going on for a month , was taking tramadol  , hx of gastric  problem , is taking meloxicam  every other day , is becoming more active , thinks this might be post sepsis pain , is losing weight    07/02/2022 Patient states that she is better ,not going back to PT    01/14/2023 Patient states right knee is swollen , she did a lot of house work this week, when she increases activity she has more pain flares. Enjoys water therapy    02/11/2023 Patient states that she is doing very well. Patient ended u getting the meloxicam  from her PCP when she went to see her and then she had a foot injection at her podiatrist and between those and the therapy she has been doing so well. She can walk, doing therapy and move around with ease since. Patient declines the gel injections today.  12/02/2023 Patient states she has been working out more. Ready for some shots      Relevant Historical Information: Hep C with liver fibrosis, GERD, diverticulosis, hypertension Additional pertinent review of systems negative.   Current Outpatient Medications:    cetirizine (ZYRTEC) 10 MG tablet, Take 10 mg by mouth daily as needed for allergies., Disp: , Rfl:    Fe Fum-FePoly-Vit C-Vit B3 (INTEGRA) 62.5-62.5-40-3 MG CAPS, Take 1 cap daily for 3 months, Disp: 30 capsule, Rfl: 2   fluconazole  (DIFLUCAN ) 150 MG tablet, Take 1 tablet (150 mg total) by mouth every 3 (three) days., Disp: 3 tablet, Rfl: 1   hydrochlorothiazide  (HYDRODIURIL ) 25 MG tablet, Take 1 tablet (25 mg total) by mouth daily., Disp: 90 tablet, Rfl: 3   levalbuterol  (XOPENEX  HFA) 45 MCG/ACT inhaler, Inhale  1-2 puffs into the lungs every 6 (six) hours as needed for wheezing or shortness of breath. (Patient taking differently: Inhale 2 puffs into the lungs every 6 (six) hours as needed for wheezing or shortness of breath.), Disp: 15 g, Rfl: 0   linaclotide  (LINZESS ) 72 MCG capsule, Take 1 capsule (72 mcg total) by mouth daily before breakfast. (Patient taking differently: Take 72 mcg by mouth daily as needed (constipation).),  Disp: 90 capsule, Rfl: 3   meclizine  (ANTIVERT ) 12.5 MG tablet, Take 1 tablet (12.5 mg total) by mouth 3 (three) times daily as needed for dizziness., Disp: 30 tablet, Rfl: 0   pantoprazole  (PROTONIX ) 40 MG tablet, Take 1 tablet (40 mg total) by mouth daily before breakfast., Disp: 30 tablet, Rfl: 1   temazepam  (RESTORIL ) 15 MG capsule, Take 1 capsule (15 mg total) by mouth at bedtime as needed for sleep., Disp: 30 capsule, Rfl: 0   terconazole  (TERAZOL 3 ) 0.8 % vaginal cream, Place 1 applicator vaginally at bedtime., Disp: 20 g, Rfl: 0   tiZANidine  (ZANAFLEX ) 2 MG tablet, TAKE 1 TABLET(2 MG) BY MOUTH EVERY 8 HOURS AS NEEDED FOR MUSCLE SPASMS, Disp: 270 tablet, Rfl: 3   traMADol  (ULTRAM ) 50 MG tablet, Take 1 tablet (50 mg total) by mouth every 12 (twelve) hours as needed., Disp: 60 tablet, Rfl: 3   Objective:     Vitals:   12/02/23 1336  Pulse: (!) 103  SpO2: 99%  Weight: 206 lb (93.4 kg)  Height: 5\' 6"  (1.676 m)      Body mass index is 33.25 kg/m.    Physical Exam:    General:  awake, alert oriented, no acute distress nontoxic Skin: no suspicious lesions or rashes Neuro:sensation intact and strength 5/5 with no deficits, no atrophy, normal muscle tone Psych: No signs of anxiety, depression or other mood disorder   Bilateral knee: Crepitus bilaterally No swelling No deformity Neg fluid wave, joint milking ROM Flex 100, Ext 5 TTP medial and lateral joint line, patella NTTP over the quad tendon, medial fem condyle, lat fem condyle,  , plica, patella tendon, tibial tuberostiy, fibular head, posterior fossa, pes anserine bursa, gerdy's tubercle,     Gait normal       Electronically signed by:  Marshall Skeeter D.[facility] 1:46 PM 12/02/23

## 2023-12-16 ENCOUNTER — Encounter (HOSPITAL_COMMUNITY): Payer: Self-pay

## 2023-12-16 ENCOUNTER — Ambulatory Visit (HOSPITAL_COMMUNITY)
Admission: RE | Admit: 2023-12-16 | Discharge: 2023-12-16 | Disposition: A | Discharge status: Home / Self Care | Source: Ambulatory Visit | Attending: Family Medicine | Admitting: Family Medicine

## 2023-12-16 ENCOUNTER — Ambulatory Visit (HOSPITAL_COMMUNITY): Payer: Self-pay

## 2023-12-16 ENCOUNTER — Ambulatory Visit (INDEPENDENT_AMBULATORY_CARE_PROVIDER_SITE_OTHER)

## 2023-12-16 VITALS — BP 168/70 | HR 84 | Temp 98.6°F | Resp 16

## 2023-12-16 DIAGNOSIS — R059 Cough, unspecified: Secondary | ICD-10-CM | POA: Diagnosis not present

## 2023-12-16 DIAGNOSIS — R051 Acute cough: Secondary | ICD-10-CM

## 2023-12-16 DIAGNOSIS — R0602 Shortness of breath: Secondary | ICD-10-CM

## 2023-12-16 DIAGNOSIS — I700 Atherosclerosis of aorta: Secondary | ICD-10-CM | POA: Diagnosis not present

## 2023-12-16 DIAGNOSIS — I10 Essential (primary) hypertension: Secondary | ICD-10-CM

## 2023-12-16 MED ORDER — HYDROCODONE BIT-HOMATROP MBR 5-1.5 MG/5ML PO SOLN: 2.5000 mL | Freq: Four times a day (QID) | ORAL | 0 refills | Status: DC | PRN

## 2023-12-16 MED ORDER — AZITHROMYCIN 250 MG PO TABS: 250.0000 mg | ORAL_TABLET | Freq: Every day | ORAL | 0 refills | Status: DC

## 2023-12-16 NOTE — ED Provider Notes (Signed)
 [facility]   [id number] 12/16/23 Arrival Time: 0906  ASSESSMENT & PLAN:  1. Acute cough   2. SOB (shortness of breath)   3. Elevated blood pressure reading with diagnosis of hypertension    I have personally viewed and independently interpreted the imaging studies ordered this visit. CXR: no acute changes.  Begin: Meds ordered this encounter  Medications   azithromycin  (ZITHROMAX ) 250 MG tablet    Sig: Take 1 tablet (250 mg total) by mouth daily. Take first 2 tablets together, then 1 every day until finished.    Dispense:  6 tablet    Refill:  0   HYDROcodone  bit-homatropine (HYCODAN) 5-1.5 MG/5ML syrup    Sig: Take 2.5-5 mLs by mouth every 6 (six) hours as needed for cough.    Dispense:  60 mL    Refill:  0     Discharge Instructions      Your blood pressure was noted to be elevated during your visit today. If you are currently taking medication for high blood pressure, please ensure you are taking this as directed. If you do not have a history of high blood pressure and your blood pressure remains persistently elevated, you may need to begin taking a medication at some point. You may return here within the next few days to recheck if unable to see your primary care provider or if you do not have a one.  Be aware, your cough medication may cause drowsiness. Please do not drive, operate heavy machinery or make important decisions while on this medication, it can cloud your judgement.  BP (!) 168/70 (BP Location: Right Arm)   Pulse 84   Temp 98.6 F (37 C) (Oral)   Resp 16   SpO2 98%   BP Readings from Last 3 Encounters:  12/16/23 (!) 168/70  11/05/23 (!) 192/85  07/15/23 (!) 160/88          Follow-up Information     Rollene Almarie LABOR, MD.   Specialty: Internal Medicine Why: For follow up and to recheck your blood pressure. Contact information: [address] [place] [address] [phone]                 Reviewed  expectations re: course of current medical issues. Questions answered. Outlined signs and symptoms indicating need for more acute intervention. Understanding verbalized. After Visit Summary given.   SUBJECTIVE: History from: Patient. Angela Ramirez is a 78 y.o. female. Patient c/o nasal congestion and a non productive cough x 6 days. Cough is affecting sleep. Patient reports that she has been taking Mucinex extended relief., cod liver oil, and zinc. Denies: fever. Normal PO intake without n/v/d. Increased blood pressure noted today. Reports that she is treated for HTN. Is taking medications as directed.   OBJECTIVE:  Vitals:   12/16/23 0926  BP: (!) 168/70  Pulse: 84  Resp: 16  Temp: 98.6 F (37 C)  TempSrc: Oral  SpO2: 98%    General appearance: alert; no distress Eyes: PERRLA; EOMI; conjunctiva normal HENT: [place]; AT; with mild nasal congestion Neck: supple  Lungs: speaks full sentences without difficulty; unlabored; CTAB; dry cough Extremities: no edema Skin: warm and dry Neurologic: normal gait Psychological: alert and cooperative; normal mood and affect  Labs:  Labs Reviewed - No data to display  Imaging: No results found.   Allergies  Allergen Reactions   Crab (Diagnostic) Hives   Morphine And Codeine Nausea And Vomiting   Sulfa Antibiotics Nausea And Vomiting  Past Medical History:  Diagnosis Date   Anxiety    Aortic atherosclerosis (HCC)    Arnold-Chiari malformation (HCC)    Arthritis    Cirrhosis (HCC)    Colon polyps    Depression    Diverticulitis    Diverticulosis    Emphysema of lung (HCC) 12/02/2020   per CT scan   Headache    Hepatitis C    Hiatal hernia    Hypertension    Hypertensive retinopathy    IBS (irritable bowel syndrome)    Internal hemorrhoids    Sepsis (HCC) 11/2020   UTI (urinary tract infection)    Social History   Socioeconomic History   Marital status: Widowed    Spouse name: Not on file   Number of  children: 3   Years of education: Not on file   Highest education level: Not on file  Occupational History   Occupation: retired  Tobacco Use   Smoking status: Former    Current packs/day: 0.00    Types: Cigarettes    Quit date: 09/29/1969    Years since quitting: 54.2   Smokeless tobacco: Never  Vaping Use   Vaping status: Never Used  Substance and Sexual Activity   Alcohol use: Not Currently    Comment: 2 glasses of wine a month   Drug use: No   Sexual activity: Not Currently  Other Topics Concern   Not on file  Social History Narrative   ** Merged History Encounter **       Social Drivers of Health   Financial Resource Strain: Not on file  Food Insecurity: No Food Insecurity (06/13/2023)   Hunger Vital Sign    Worried About Running Out of Food in the Last Year: Never true    Ran Out of Food in the Last Year: Never true  Transportation Needs: No Transportation Needs (06/13/2023)   PRAPARE - Administrator, Civil Service (Medical): No    Lack of Transportation (Non-Medical): No  Physical Activity: Not on file  Stress: Not on file  Social Connections: Not on file  Intimate Partner Violence: Not At Risk (06/13/2023)   Humiliation, Afraid, Rape, and Kick questionnaire    Fear of Current or Ex-Partner: No    Emotionally Abused: No    Physically Abused: No    Sexually Abused: No   Family History  Problem Relation Age of Onset   Arthritis Mother    Hypertension Mother    Kidney disease Mother    Diabetes Mother    Alcohol abuse Father    Arthritis Father    Lung cancer Father    Hypertension Sister    Rheum arthritis Sister    Diabetes Maternal Grandmother    Breast cancer Paternal Grandmother    Hypertension Daughter    Parkinson's disease Son    Diabetes Maternal Aunt    Kidney failure Maternal Aunt    Diabetes Maternal Uncle    Breast cancer Paternal Aunt    Breast cancer Paternal Aunt    Colon cancer Neg Hx    Stomach cancer Neg Hx     Esophageal cancer Neg Hx    Past Surgical History:  Procedure Laterality Date   BRAIN SURGERY  2003   decompression   BREAST EXCISIONAL BIOPSY Bilateral    age 65's   CATARACT EXTRACTION     DILATION AND CURETTAGE OF UTERUS     ESOPHAGOGASTRODUODENOSCOPY (EGD) WITH PROPOFOL  N/A 06/15/2023   Procedure: ESOPHAGOGASTRODUODENOSCOPY (EGD) WITH PROPOFOL ;  Surgeon: Albertus Gordy HERO, MD;  Location: [facility] ENDOSCOPY;  Service: Gastroenterology;  Laterality: N/A;   EYE SURGERY     OVARIAN CYST REMOVAL Bilateral    REDUCTION MAMMAPLASTY Bilateral    25+ years ago     Angela Rogue, MD 12/16/23 1145

## 2023-12-16 NOTE — Discharge Instructions (Addendum)
 Your blood pressure was noted to be elevated during your visit today. If you are currently taking medication for high blood pressure, please ensure you are taking this as directed. If you do not have a history of high blood pressure and your blood pressure remains persistently elevated, you may need to begin taking a medication at some point. You may return here within the next few days to recheck if unable to see your primary care provider or if you do not have a one.  Be aware, your cough medication may cause drowsiness. Please do not drive, operate heavy machinery or make important decisions while on this medication, it can cloud your judgement.  BP (!) 168/70 (BP Location: Right Arm)   Pulse 84   Temp 98.6 F (37 C) (Oral)   Resp 16   SpO2 98%   BP Readings from Last 3 Encounters:  12/16/23 (!) 168/70  11/05/23 (!) 192/85  07/15/23 (!) 160/88

## 2023-12-16 NOTE — ED Triage Notes (Signed)
 Patient c/o nasal congestion and a non productive cough x 6 days.   Patient reports that she has been taking Mucinex extended relief., cod liver oil, and zinc.

## 2023-12-22 ENCOUNTER — Ambulatory Visit (INDEPENDENT_AMBULATORY_CARE_PROVIDER_SITE_OTHER): Admitting: Internal Medicine

## 2023-12-22 ENCOUNTER — Encounter (INDEPENDENT_AMBULATORY_CARE_PROVIDER_SITE_OTHER): Payer: Self-pay

## 2023-12-22 ENCOUNTER — Encounter: Payer: Self-pay | Admitting: Internal Medicine

## 2023-12-22 VITALS — BP 140/80 | HR 79 | Temp 97.9°F | Ht 66.0 in | Wt 206.0 lb

## 2023-12-22 DIAGNOSIS — J189 Pneumonia, unspecified organism: Secondary | ICD-10-CM | POA: Diagnosis not present

## 2023-12-22 DIAGNOSIS — E66811 Obesity, class 1: Secondary | ICD-10-CM

## 2023-12-22 DIAGNOSIS — I10 Essential (primary) hypertension: Secondary | ICD-10-CM | POA: Diagnosis not present

## 2023-12-22 DIAGNOSIS — G4700 Insomnia, unspecified: Secondary | ICD-10-CM | POA: Diagnosis not present

## 2023-12-22 MED ORDER — PROMETHAZINE-DM 6.25-15 MG/5ML PO SYRP: 5.0000 mL | ORAL_SOLUTION | Freq: Four times a day (QID) | ORAL | 0 refills | Status: DC | PRN

## 2023-12-22 MED ORDER — AZITHROMYCIN 250 MG PO TABS: 250.0000 mg | ORAL_TABLET | Freq: Every day | ORAL | 0 refills | Status: DC

## 2023-12-22 NOTE — Patient Instructions (Signed)
 We will send in another round of azithromycin  and the promethazine cough medicine.
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
## 2023-12-22 NOTE — Assessment & Plan Note (Signed)
 Persistent rhonchi on exam will retreat with azithromycin  and rx promethazine/dm cough syrup.

## 2023-12-22 NOTE — Assessment & Plan Note (Signed)
 Takes temazepam  for sleep as needed and can refill when needed.

## 2023-12-22 NOTE — Assessment & Plan Note (Signed)
 BMI 33 and desires to see weight loss clinic. Referral done.

## 2023-12-22 NOTE — Assessment & Plan Note (Signed)
 BP at goal with meds. She has not taken yet this morning. Continue hydrochlorothiazide  25 mg daily

## 2023-12-22 NOTE — Progress Notes (Signed)
   Subjective:   Patient ID: Angela Ramirez, female    DOB: [birth year], 78 y.o.   MRN: [id number]  HPI The patient is a 78 YO female coming in for lack of improvement of cough symptoms. Seen at urgent care and taken azithromycin  and this did help some but is regressing now that she is off this for 2 days. They gave her a cough medicine which is not the one she normally takes so she did not use. Needing her xopenex  daily and having cough which causes SOB. Wants to go to weight loss clinic. Has questions about aortic atherosclerosis.   Review of Systems  Constitutional:  Positive for activity change and fatigue.  HENT: Negative.    Eyes: Negative.   Respiratory:  Positive for cough and shortness of breath. Negative for chest tightness.   Cardiovascular:  Negative for chest pain, palpitations and leg swelling.  Gastrointestinal:  Negative for abdominal distention, abdominal pain, constipation, diarrhea, nausea and vomiting.  Musculoskeletal: Negative.   Skin: Negative.   Neurological: Negative.   Psychiatric/Behavioral: Negative.      Objective:  Physical Exam Constitutional:      Appearance: She is well-developed.  HENT:     Head: Normocephalic and atraumatic.   Cardiovascular:     Rate and Rhythm: Normal rate and regular rhythm.  Pulmonary:     Effort: Pulmonary effort is normal. No respiratory distress.     Breath sounds: Rhonchi present. No wheezing or rales.  Abdominal:     General: Bowel sounds are normal. There is no distension.     Palpations: Abdomen is soft.     Tenderness: There is no abdominal tenderness. There is no rebound.   Musculoskeletal:     Cervical back: Normal range of motion.   Skin:    General: Skin is warm and dry.   Neurological:     Mental Status: She is alert and oriented to person, place, and time.     Coordination: Coordination normal.     Vitals:   12/22/23 0913  BP: (!) 140/80  Pulse: 79  Temp: 97.9 F (36.6 C)  TempSrc: Oral   SpO2: 98%  Weight: 206 lb (93.4 kg)  Height: 5' 6 (1.676 m)    Assessment & Plan:

## 2023-12-29 ENCOUNTER — Encounter (INDEPENDENT_AMBULATORY_CARE_PROVIDER_SITE_OTHER): Admitting: Ophthalmology

## 2023-12-29 DIAGNOSIS — H43813 Vitreous degeneration, bilateral: Secondary | ICD-10-CM

## 2023-12-29 DIAGNOSIS — H35033 Hypertensive retinopathy, bilateral: Secondary | ICD-10-CM

## 2023-12-29 DIAGNOSIS — H353221 Exudative age-related macular degeneration, left eye, with active choroidal neovascularization: Secondary | ICD-10-CM

## 2023-12-29 DIAGNOSIS — H4312 Vitreous hemorrhage, left eye: Secondary | ICD-10-CM

## 2023-12-29 DIAGNOSIS — I10 Essential (primary) hypertension: Secondary | ICD-10-CM

## 2023-12-29 DIAGNOSIS — H532 Diplopia: Secondary | ICD-10-CM

## 2023-12-29 DIAGNOSIS — H442E3 Degenerative myopia with other maculopathy, bilateral eye: Secondary | ICD-10-CM

## 2023-12-29 DIAGNOSIS — Z961 Presence of intraocular lens: Secondary | ICD-10-CM

## 2023-12-29 DIAGNOSIS — H5213 Myopia, bilateral: Secondary | ICD-10-CM
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
## 2023-12-31 NOTE — Progress Notes (Unsigned)
 Ben Jackson D.CLEMENTE[facility] [address] [place] [address] Phone: [phone]   Assessment and Plan:     There are no diagnoses linked to this encounter.  ***   Pertinent previous records reviewed include ***    Follow Up: ***     Subjective:   I, Ryosuke Ericksen, am serving as a Neurosurgeon for Doctor Morene Mace   Chief Complaint: bilateral knee pain    HPI:    05/21/2022 Patient is a 78 year old female complaining of bilateral knee pain. patient states that she is having pain in her thighs when she walks , notes weakness in he knee when she stands, been going on for a month , was taking tramadol  , hx of gastric problem , is taking meloxicam  every other day , is becoming more active , thinks this might be post sepsis pain , is losing weight    07/02/2022 Patient states that she is better ,not going back to PT    01/14/2023 Patient states right knee is swollen , she did a lot of house work this week, when she increases activity she has more pain flares. Enjoys water therapy    02/11/2023 Patient states that she is doing very well. Patient ended u getting the meloxicam  from her PCP when she went to see her and then she had a foot injection at her podiatrist and between those and the therapy she has been doing so well. She can walk, doing therapy and move around with ease since. Patient declines the gel injections today.   12/02/2023 Patient states she has been working out more. Ready for some shots    01/01/2024 Patient states   Relevant Historical Information: Hep C with liver fibrosis, GERD, diverticulosis, hypertension  Additional pertinent review of systems negative.   Current Outpatient Medications:    azithromycin  (ZITHROMAX ) 250 MG tablet, Take 1 tablet (250 mg total) by mouth daily. Take first 2 tablets together, then 1 every day until finished., Disp: 6 tablet, Rfl: 0   cetirizine (ZYRTEC) 10 MG tablet, Take 10 mg by mouth  daily as needed for allergies., Disp: , Rfl:    Fe Fum-FePoly-Vit C-Vit B3 (INTEGRA) 62.5-62.5-40-3 MG CAPS, Take 1 cap daily for 3 months, Disp: 30 capsule, Rfl: 2   hydrochlorothiazide  (HYDRODIURIL ) 25 MG tablet, Take 1 tablet (25 mg total) by mouth daily., Disp: 90 tablet, Rfl: 3   levalbuterol  (XOPENEX  HFA) 45 MCG/ACT inhaler, Inhale 1-2 puffs into the lungs every 6 (six) hours as needed for wheezing or shortness of breath. (Patient taking differently: Inhale 2 puffs into the lungs every 6 (six) hours as needed for wheezing or shortness of breath.), Disp: 15 g, Rfl: 0   linaclotide  (LINZESS ) 72 MCG capsule, Take 1 capsule (72 mcg total) by mouth daily before breakfast. (Patient taking differently: Take 72 mcg by mouth daily as needed (constipation).), Disp: 90 capsule, Rfl: 3   meclizine  (ANTIVERT ) 12.5 MG tablet, Take 1 tablet (12.5 mg total) by mouth 3 (three) times daily as needed for dizziness., Disp: 30 tablet, Rfl: 0   pantoprazole  (PROTONIX ) 40 MG tablet, Take 1 tablet (40 mg total) by mouth daily before breakfast., Disp: 30 tablet, Rfl: 1   promethazine -dextromethorphan (PROMETHAZINE -DM) 6.25-15 MG/5ML syrup, Take 5 mLs by mouth 4 (four) times daily as needed., Disp: 118 mL, Rfl: 0   temazepam  (RESTORIL ) 15 MG capsule, Take 1 capsule (15 mg total) by mouth at bedtime as needed for sleep., Disp: 30 capsule,  Rfl: 0   terconazole  (TERAZOL 3 ) 0.8 % vaginal cream, Place 1 applicator vaginally at bedtime., Disp: 20 g, Rfl: 0   tiZANidine  (ZANAFLEX ) 2 MG tablet, TAKE 1 TABLET(2 MG) BY MOUTH EVERY 8 HOURS AS NEEDED FOR MUSCLE SPASMS, Disp: 270 tablet, Rfl: 3   traMADol  (ULTRAM ) 50 MG tablet, Take 1 tablet (50 mg total) by mouth every 12 (twelve) hours as needed., Disp: 60 tablet, Rfl: 3   Objective:     There were no vitals filed for this visit.    There is no height or weight on file to calculate BMI.    Physical Exam:    ***   Electronically signed by:  Odis Mace D.[facility] 7:38 AM 12/31/23

## 2024-01-01 ENCOUNTER — Ambulatory Visit: Admitting: Sports Medicine

## 2024-01-01 ENCOUNTER — Telehealth: Payer: Self-pay | Admitting: Sports Medicine

## 2024-01-01 VITALS — HR 81 | Ht 66.0 in | Wt 206.0 lb

## 2024-01-01 DIAGNOSIS — M170 Bilateral primary osteoarthritis of knee: Secondary | ICD-10-CM | POA: Diagnosis not present

## 2024-01-01 DIAGNOSIS — M25561 Pain in right knee: Secondary | ICD-10-CM

## 2024-01-01 DIAGNOSIS — G8929 Other chronic pain: Secondary | ICD-10-CM

## 2024-01-01 DIAGNOSIS — M25562 Pain in left knee: Secondary | ICD-10-CM | POA: Diagnosis not present

## 2024-01-01 NOTE — Patient Instructions (Signed)
 Bilat gel  We will call you when ready

## 2024-01-01 NOTE — Telephone Encounter (Signed)
 Closing encounter

## 2024-01-01 NOTE — Telephone Encounter (Signed)
 Ran benefits for [employer] case ID [id number]

## 2024-01-05 ENCOUNTER — Ambulatory Visit (INDEPENDENT_AMBULATORY_CARE_PROVIDER_SITE_OTHER): Admitting: Ophthalmology

## 2024-01-05 ENCOUNTER — Encounter (INDEPENDENT_AMBULATORY_CARE_PROVIDER_SITE_OTHER): Payer: Self-pay | Admitting: Ophthalmology

## 2024-01-05 DIAGNOSIS — H442E3 Degenerative myopia with other maculopathy, bilateral eye: Secondary | ICD-10-CM

## 2024-01-05 DIAGNOSIS — H43813 Vitreous degeneration, bilateral: Secondary | ICD-10-CM | POA: Diagnosis not present

## 2024-01-05 DIAGNOSIS — H4312 Vitreous hemorrhage, left eye: Secondary | ICD-10-CM

## 2024-01-05 DIAGNOSIS — H353221 Exudative age-related macular degeneration, left eye, with active choroidal neovascularization: Secondary | ICD-10-CM | POA: Diagnosis not present

## 2024-01-05 DIAGNOSIS — I10 Essential (primary) hypertension: Secondary | ICD-10-CM

## 2024-01-05 DIAGNOSIS — H532 Diplopia: Secondary | ICD-10-CM

## 2024-01-05 DIAGNOSIS — Z961 Presence of intraocular lens: Secondary | ICD-10-CM

## 2024-01-05 DIAGNOSIS — H5213 Myopia, bilateral: Secondary | ICD-10-CM

## 2024-01-05 DIAGNOSIS — H35033 Hypertensive retinopathy, bilateral: Secondary | ICD-10-CM | POA: Diagnosis not present

## 2024-01-05 MED ORDER — BEVACIZUMAB CHEMO INJECTION 1.25MG/0.05ML SYRINGE FOR KALEIDOSCOPE
1.2500 mg | INTRAVITREAL | Status: AC | PRN
  Administered 2024-01-05: 1.25 mg via INTRAVITREAL

## 2024-01-13 ENCOUNTER — Ambulatory Visit (INDEPENDENT_AMBULATORY_CARE_PROVIDER_SITE_OTHER)

## 2024-01-13 ENCOUNTER — Encounter (HOSPITAL_COMMUNITY): Payer: Self-pay

## 2024-01-13 ENCOUNTER — Ambulatory Visit (HOSPITAL_COMMUNITY)
Admission: EM | Admit: 2024-01-13 | Discharge: 2024-01-13 | Disposition: A | Discharge status: Home / Self Care | Attending: Family Medicine | Admitting: Family Medicine

## 2024-01-13 DIAGNOSIS — M1812 Unilateral primary osteoarthritis of first carpometacarpal joint, left hand: Secondary | ICD-10-CM | POA: Diagnosis not present

## 2024-01-13 DIAGNOSIS — I10 Essential (primary) hypertension: Secondary | ICD-10-CM

## 2024-01-13 DIAGNOSIS — M79642 Pain in left hand: Secondary | ICD-10-CM | POA: Diagnosis not present

## 2024-01-13 DIAGNOSIS — S61203A Unspecified open wound of left middle finger without damage to nail, initial encounter: Secondary | ICD-10-CM | POA: Diagnosis not present

## 2024-01-13 DIAGNOSIS — L089 Local infection of the skin and subcutaneous tissue, unspecified: Secondary | ICD-10-CM | POA: Diagnosis not present

## 2024-01-13 DIAGNOSIS — B9689 Other specified bacterial agents as the cause of diseases classified elsewhere: Secondary | ICD-10-CM | POA: Diagnosis not present

## 2024-01-13 MED ORDER — DOXYCYCLINE HYCLATE 100 MG PO CAPS: 100.0000 mg | ORAL_CAPSULE | Freq: Two times a day (BID) | ORAL | 0 refills | Status: DC

## 2024-01-13 NOTE — Discharge Instructions (Signed)
 Your blood pressure was noted to be elevated during your visit today. If you are currently taking medication for high blood pressure, please ensure you are taking this as directed. If you do not have a history of high blood pressure and your blood pressure remains persistently elevated, you may need to begin taking a medication at some point. You may return here within the next few days to recheck if unable to see your primary care provider or if you do not have a one.  BP (!) 170/90 (BP Location: Right Arm)   Pulse (!) 110   Temp 98 F (36.7 C) (Oral)   Resp 16   SpO2 96%   BP Readings from Last 3 Encounters:  01/13/24 (!) 170/90  12/22/23 (!) 140/80  12/16/23 (!) 168/70

## 2024-01-13 NOTE — ED Triage Notes (Signed)
 Pt states she banged her left middle finger and had an open sore on it last week.  States yesterday she had pus coming out of it and it became red and painful to her hand and wrist. States she has been using neosporin on it.

## 2024-01-14 ENCOUNTER — Ambulatory Visit: Admitting: Internal Medicine

## 2024-01-14 NOTE — ED Provider Notes (Signed)
 [facility]   [id number] 01/13/24 Arrival Time: 1234  ASSESSMENT & PLAN:  1. Left hand pain   2. Localized bacterial skin infection   3. Elevated blood pressure reading with diagnosis of hypertension    I have personally viewed and independently interpreted the imaging studies ordered this visit. L hand: no bony abnormalities of signs of osteomyelitis  Will cover for possible infection of finger wound.   Discharge Instructions      Your blood pressure was noted to be elevated during your visit today. If you are currently taking medication for high blood pressure, please ensure you are taking this as directed. If you do not have a history of high blood pressure and your blood pressure remains persistently elevated, you may need to begin taking a medication at some point. You may return here within the next few days to recheck if unable to see your primary care provider or if you do not have a one.  BP (!) 170/90 (BP Location: Right Arm)   Pulse (!) 110   Temp 98 F (36.7 C) (Oral)   Resp 16   SpO2 96%   BP Readings from Last 3 Encounters:  01/13/24 (!) 170/90  12/22/23 (!) 140/80  12/16/23 (!) 168/70         Meds ordered this encounter  Medications   doxycycline  (VIBRAMYCIN ) 100 MG capsule    Sig: Take 1 capsule (100 mg total) by mouth 2 (two) times daily.    Dispense:  14 capsule    Refill:  0   Watch finger and BP closely.  Follow-up Information     [facility] at [facility].   Specialty: Urgent Care Why: If worsening or failing to improve as anticipated. Contact information: [address] [place]  [address] [phone]                Reviewed expectations re: course of current medical issues. Questions answered. Outlined signs and symptoms indicating need for more acute intervention. Understanding verbalized. After Visit Summary given.   SUBJECTIVE: History from: Patient. Angela Ramirez is a 78  y.o. female. Pt states she banged her left middle finger and had an open sore on it last week.  States yesterday she had pus coming out of it and it became red and painful to her hand and wrist. States she has been using neosporin on it. Denies: fever. Normal PO intake without n/v/d.  Increased blood pressure noted today. Reports that she is treated for HTN. Taking meds.   OBJECTIVE:  Vitals:   01/13/24 1302 01/13/24 1304 01/13/24 1414  BP:  (!) 170/90   Pulse:  (!) 110 88  Resp: 16    Temp: 98 F (36.7 C)    TempSrc: Oral    SpO2:  96%     Recheck HR: 92  General appearance: alert; no distress Lungs: speaks full sentences without difficulty; unlabored CV: reg Extremities: no edema: L distal 3rd finger with skin avulsion and mild erythema; without purulent drainage Skin: warm and dry Neurologic: normal gait Psychological: alert and cooperative; normal mood and affect  Labs:  Labs Reviewed - No data to display  Imaging: DG Hand Complete Left Result Date: 01/13/2024 CLINICAL DATA:  Middle finger injury with wound which became purulent EXAM: LEFT HAND - COMPLETE 3+ VIEW COMPARISON:  None Available. FINDINGS: Osteoarthritis including distal interphalangeal articular space narrowing and substantial spurring in the fingers. No bony destructive findings characteristic of osteomyelitis. Osteoarthritis at the first  carpometacarpal articulation and laterally in the carpus. Somewhat elongated ulnar styloid noted with likely degenerative subcortical lucency along the base of the styloid. Faint chondrocalcinosis in the ulnocarpal articulation. No retained foreign body observed. No definite gas tracking in the soft tissues of the middle finger. IMPRESSION: 1. No bony destructive findings characteristic of osteomyelitis. 2. Osteoarthritis. 3. Faint chondrocalcinosis in the ulnocarpal articulation. Electronically Signed   By: Ryan Salvage M.D.   On: 01/13/2024 13:54    Allergies  Allergen  Reactions   Crab (Diagnostic) Hives   Morphine And Codeine Nausea And Vomiting   Sulfa Antibiotics Nausea And Vomiting    Past Medical History:  Diagnosis Date   Anxiety    Aortic atherosclerosis (HCC)    Arnold-Chiari malformation (HCC)    Arthritis    Cirrhosis (HCC)    Colon polyps    Depression    Diverticulitis    Diverticulosis    Emphysema of lung (HCC) 12/02/2020   per CT scan   Headache    Hepatitis C    Hiatal hernia    Hypertension    Hypertensive retinopathy    IBS (irritable bowel syndrome)    Internal hemorrhoids    Sepsis (HCC) 11/2020   UTI (urinary tract infection)    Social History   Socioeconomic History   Marital status: Widowed    Spouse name: Not on file   Number of children: 3   Years of education: Not on file   Highest education level: Not on file  Occupational History   Occupation: retired  Tobacco Use   Smoking status: Former    Current packs/day: 0.00    Types: Cigarettes    Quit date: 09/29/1969    Years since quitting: 54.3   Smokeless tobacco: Never  Vaping Use   Vaping status: Never Used  Substance and Sexual Activity   Alcohol use: Not Currently    Comment: 2 glasses of wine a month   Drug use: No   Sexual activity: Not Currently  Other Topics Concern   Not on file  Social History Narrative   ** Merged History Encounter **       Social Drivers of Health   Financial Resource Strain: Not on file  Food Insecurity: No Food Insecurity (06/13/2023)   Hunger Vital Sign    Worried About Running Out of Food in the Last Year: Never true    Ran Out of Food in the Last Year: Never true  Transportation Needs: No Transportation Needs (06/13/2023)   PRAPARE - Administrator, Civil Service (Medical): No    Lack of Transportation (Non-Medical): No  Physical Activity: Not on file  Stress: Not on file  Social Connections: Not on file  Intimate Partner Violence: Not At Risk (06/13/2023)   Humiliation, Afraid, Rape, and  Kick questionnaire    Fear of Current or Ex-Partner: No    Emotionally Abused: No    Physically Abused: No    Sexually Abused: No   Family History  Problem Relation Age of Onset   Arthritis Mother    Hypertension Mother    Kidney disease Mother    Diabetes Mother    Alcohol abuse Father    Arthritis Father    Lung cancer Father    Hypertension Sister    Rheum arthritis Sister    Diabetes Maternal Grandmother    Breast cancer Paternal Grandmother    Hypertension Daughter    Parkinson's disease Son    Diabetes Maternal Aunt  Kidney failure Maternal Aunt    Diabetes Maternal Uncle    Breast cancer Paternal Aunt    Breast cancer Paternal Aunt    Colon cancer Neg Hx    Stomach cancer Neg Hx    Esophageal cancer Neg Hx    Past Surgical History:  Procedure Laterality Date   BRAIN SURGERY  2003   decompression   BREAST EXCISIONAL BIOPSY Bilateral    age 26's   CATARACT EXTRACTION     DILATION AND CURETTAGE OF UTERUS     ESOPHAGOGASTRODUODENOSCOPY (EGD) WITH PROPOFOL  N/A 06/15/2023   Procedure: ESOPHAGOGASTRODUODENOSCOPY (EGD) WITH PROPOFOL ;  Surgeon: Albertus Gordy HERO, MD;  Location: [facility] ENDOSCOPY;  Service: Gastroenterology;  Laterality: N/A;   EYE SURGERY     OVARIAN CYST REMOVAL Bilateral    REDUCTION MAMMAPLASTY Bilateral    25+ years ago     Rolinda Rogue, MD 01/14/24 [phone]

## 2024-01-15 NOTE — Telephone Encounter (Signed)
 Please schedule patient when medication is stocked  [employer] Authorized for bilateral knee Coinsurance 80% Copay $20 Deductible does not apply OOP MAX $3400 has met $159.53 Once Oop has been met coverage goes to 100% NO PA REQUIRED Reference # [id number]

## 2024-01-19 ENCOUNTER — Encounter: Payer: Self-pay | Admitting: Podiatry

## 2024-01-19 ENCOUNTER — Ambulatory Visit: Admitting: Podiatry

## 2024-01-19 ENCOUNTER — Ambulatory Visit (INDEPENDENT_AMBULATORY_CARE_PROVIDER_SITE_OTHER)

## 2024-01-19 VITALS — Ht 66.0 in | Wt 206.0 lb

## 2024-01-19 DIAGNOSIS — M19072 Primary osteoarthritis, left ankle and foot: Secondary | ICD-10-CM

## 2024-01-19 MED ORDER — BETAMETHASONE SOD PHOS & ACET 6 (3-3) MG/ML IJ SUSP
3.0000 mg | Freq: Once | INTRAMUSCULAR | Status: AC
  Administered 2024-01-19: 3 mg via INTRA_ARTICULAR

## 2024-01-19 NOTE — Telephone Encounter (Signed)
 Scheduled 8/6

## 2024-01-19 NOTE — Progress Notes (Signed)
 Chief Complaint  Patient presents with   Injections    Pt is here to receive injection into left foot due to pain.    HPI: 78 y.o. female presenting today for follow-up evaluation of left midfoot pain.  Patient states that the injection last visit helped significantly.  Over the last few months the pain has slowly returned.  She does have a history of arthritis.  Requesting another injection today  History of bunion and midfoot exostectomy surgery left.  She says the surgical areas are doing well. She also has right knee arthritis and she feels that she is compensating and applying extra pressure on her left lower extremity.  Past Medical History:  Diagnosis Date   Anxiety    Aortic atherosclerosis (HCC)    Arnold-Chiari malformation (HCC)    Arthritis    Cirrhosis (HCC)    Colon polyps    Depression    Diverticulitis    Diverticulosis    Emphysema of lung (HCC) 12/02/2020   per CT scan   Headache    Hepatitis C    Hiatal hernia    Hypertension    Hypertensive retinopathy    IBS (irritable bowel syndrome)    Internal hemorrhoids    Sepsis (HCC) 11/2020   UTI (urinary tract infection)     Past Surgical History:  Procedure Laterality Date   BRAIN SURGERY  2003   decompression   BREAST EXCISIONAL BIOPSY Bilateral    age 16's   CATARACT EXTRACTION     DILATION AND CURETTAGE OF UTERUS     ESOPHAGOGASTRODUODENOSCOPY (EGD) WITH PROPOFOL  N/A 06/15/2023   Procedure: ESOPHAGOGASTRODUODENOSCOPY (EGD) WITH PROPOFOL ;  Surgeon: Albertus Gordy HERO, MD;  Location: [facility] ENDOSCOPY;  Service: Gastroenterology;  Laterality: N/A;   EYE SURGERY     OVARIAN CYST REMOVAL Bilateral    REDUCTION MAMMAPLASTY Bilateral    25+ years ago    Allergies  Allergen Reactions   Crab (Diagnostic) Hives   Morphine And Codeine Nausea And Vomiting   Sulfa Antibiotics Nausea And Vomiting     Physical Exam: General: The patient is alert and oriented x3 in no acute distress.  Dermatology: Skin is warm,  dry and supple bilateral lower extremities.   Vascular: Palpable pedal pulses bilaterally. Capillary refill within normal limits.  No appreciable edema.  No erythema.  Neurological: Grossly intact via light touch  Musculoskeletal Exam: No pedal deformities noted.  Tenderness with palpation noted around the fourth and fifth TMT of the left midfoot consistent with degenerative changes and arthritis.  With weightbearing there is collapse of medial longitudinal arch of the foot which seems to exacerbate some pressure along the lateral column of the foot with gait and ambulation.  This may be contributing to some of her lateral column foot pain  Radiographic Exam LT foot 01/29/2023:  Prior history of bunion surgery with 2 orthopedic screws within the distal portion of the first metatarsal which appear stable.  Advanced severe degenerative changes noted throughout the TMT joint and midtarsal joints.  Impression: Advanced severe DJD/arthritis left midfoot  Assessment/Plan of Care: 1.  Arthritis/DJD left midfoot 2.  Pes planovalgus deformity bilateral  -Patient evaluated.  X-rays reviewed that were taken 01/29/2023 -Injection of 0.5 cc Celestone  Soluspan injected around the fourth and fifth TMT of the left foot -Advise against going barefoot.  Recommend good supportive tennis shoes and sneakers -Also recommend arch supports to support the medial longitudinal arch of the foot and potentially alleviate pressure from the lateral column of  the foot.  Recommend OTC prefabricated insoles from [employer] -Patient has had custom orthotics in the past but does not find them comfortable -Patient unable to take NSAIDs.  History of diverticulitis and GI bleed -Return to clinic as needed     Thresa EMERSON Sar, DPM [facility]  Dr. Thresa EMERSON Sar, DPM    [address] [place], [place] [address]                Office [phone]  Fax [phone]

## 2024-01-26 ENCOUNTER — Encounter: Payer: Self-pay | Admitting: *Deleted

## 2024-01-27 NOTE — Progress Notes (Unsigned)
 Ben Jackson D.[facility] [address] [place] [address] Phone: [phone]   Assessment and Plan:     There are no diagnoses linked to this encounter.  ***   Pertinent previous records reviewed include ***    Follow Up: ***     Subjective:   I, Hartman Minahan, am serving as a Neurosurgeon for Doctor Morene Mace   Chief Complaint: bilateral knee pain    HPI:    05/21/2022 Patient is a 78 year old female complaining of bilateral knee pain. patient states that she is having pain in her thighs when she walks , notes weakness in he knee when she stands, been going on for a month , was taking tramadol  , hx of gastric problem , is taking meloxicam  every other day , is becoming more active , thinks this might be post sepsis pain , is losing weight    07/02/2022 Patient states that she is better ,not going back to PT    01/14/2023 Patient states right knee is swollen , she did a lot of house work this week, when she increases activity she has more pain flares. Enjoys water therapy    02/11/2023 Patient states that she is doing very well. Patient ended u getting the meloxicam  from her PCP when she went to see her and then she had a foot injection at her podiatrist and between those and the therapy she has been doing so well. She can walk, doing therapy and move around with ease since. Patient declines the gel injections today.   12/02/2023 Patient states she has been working out more. Ready for some shots    01/01/2024 Patient states she is so so   01/28/2024 Patient states   Relevant Historical Information: Hep C with liver fibrosis, GERD, diverticulosis, hypertension   Additional pertinent review of systems negative.   Current Outpatient Medications:    cetirizine (ZYRTEC) 10 MG tablet, Take 10 mg by mouth daily as needed for allergies., Disp: , Rfl:    doxycycline  (VIBRAMYCIN ) 100 MG capsule, Take 1 capsule (100 mg total) by mouth 2  (two) times daily., Disp: 14 capsule, Rfl: 0   Fe Fum-FePoly-Vit C-Vit B3 (INTEGRA) 62.5-62.5-40-3 MG CAPS, Take 1 cap daily for 3 months, Disp: 30 capsule, Rfl: 2   hydrochlorothiazide  (HYDRODIURIL ) 25 MG tablet, Take 1 tablet (25 mg total) by mouth daily., Disp: 90 tablet, Rfl: 3   levalbuterol  (XOPENEX  HFA) 45 MCG/ACT inhaler, Inhale 1-2 puffs into the lungs every 6 (six) hours as needed for wheezing or shortness of breath. (Patient taking differently: Inhale 2 puffs into the lungs every 6 (six) hours as needed for wheezing or shortness of breath.), Disp: 15 g, Rfl: 0   linaclotide  (LINZESS ) 72 MCG capsule, Take 1 capsule (72 mcg total) by mouth daily before breakfast. (Patient taking differently: Take 72 mcg by mouth daily as needed (constipation).), Disp: 90 capsule, Rfl: 3   meclizine  (ANTIVERT ) 12.5 MG tablet, Take 1 tablet (12.5 mg total) by mouth 3 (three) times daily as needed for dizziness., Disp: 30 tablet, Rfl: 0   pantoprazole  (PROTONIX ) 40 MG tablet, Take 1 tablet (40 mg total) by mouth daily before breakfast., Disp: 30 tablet, Rfl: 1   promethazine -dextromethorphan (PROMETHAZINE -DM) 6.25-15 MG/5ML syrup, Take 5 mLs by mouth 4 (four) times daily as needed., Disp: 118 mL, Rfl: 0   temazepam  (RESTORIL ) 15 MG capsule, Take 1 capsule (15 mg total) by mouth at bedtime as needed for sleep., Disp:  30 capsule, Rfl: 0   terconazole  (TERAZOL 3 ) 0.8 % vaginal cream, Place 1 applicator vaginally at bedtime., Disp: 20 g, Rfl: 0   tiZANidine  (ZANAFLEX ) 2 MG tablet, TAKE 1 TABLET(2 MG) BY MOUTH EVERY 8 HOURS AS NEEDED FOR MUSCLE SPASMS, Disp: 270 tablet, Rfl: 3   traMADol  (ULTRAM ) 50 MG tablet, Take 1 tablet (50 mg total) by mouth every 12 (twelve) hours as needed., Disp: 60 tablet, Rfl: 3   Objective:     There were no vitals filed for this visit.    There is no height or weight on file to calculate BMI.    Physical Exam:    ***   Electronically signed by:  Odis Mace D.[facility] 11:43 AM 01/27/24

## 2024-01-28 ENCOUNTER — Ambulatory Visit: Admitting: Sports Medicine

## 2024-01-28 DIAGNOSIS — G8929 Other chronic pain: Secondary | ICD-10-CM

## 2024-01-28 DIAGNOSIS — M170 Bilateral primary osteoarthritis of knee: Secondary | ICD-10-CM | POA: Diagnosis not present

## 2024-01-28 DIAGNOSIS — M25561 Pain in right knee: Secondary | ICD-10-CM

## 2024-01-28 DIAGNOSIS — M25562 Pain in left knee: Secondary | ICD-10-CM

## 2024-01-28 MED ORDER — HYALURONAN 88 MG/4ML IX SOSY
88.0000 mg | PREFILLED_SYRINGE | Freq: Once | INTRA_ARTICULAR | Status: AC
  Administered 2024-01-28: 88 mg via INTRA_ARTICULAR

## 2024-01-30 ENCOUNTER — Other Ambulatory Visit: Payer: Self-pay | Admitting: Internal Medicine

## 2024-02-02 ENCOUNTER — Ambulatory Visit (INDEPENDENT_AMBULATORY_CARE_PROVIDER_SITE_OTHER): Admitting: Family Medicine

## 2024-02-02 ENCOUNTER — Encounter (INDEPENDENT_AMBULATORY_CARE_PROVIDER_SITE_OTHER): Payer: Self-pay | Admitting: Family Medicine

## 2024-02-02 VITALS — BP 160/83 | HR 85 | Temp 98.2°F | Ht 65.5 in | Wt 208.0 lb

## 2024-02-02 DIAGNOSIS — I10 Essential (primary) hypertension: Secondary | ICD-10-CM

## 2024-02-02 DIAGNOSIS — Z6834 Body mass index (BMI) 34.0-34.9, adult: Secondary | ICD-10-CM

## 2024-02-02 DIAGNOSIS — R7303 Prediabetes: Secondary | ICD-10-CM | POA: Diagnosis not present

## 2024-02-02 DIAGNOSIS — I701 Atherosclerosis of renal artery: Secondary | ICD-10-CM | POA: Diagnosis not present

## 2024-02-02 DIAGNOSIS — E669 Obesity, unspecified: Secondary | ICD-10-CM | POA: Diagnosis not present

## 2024-02-02 DIAGNOSIS — Z0289 Encounter for other administrative examinations: Secondary | ICD-10-CM

## 2024-02-02 DIAGNOSIS — E66811 Obesity, class 1: Secondary | ICD-10-CM

## 2024-02-02 NOTE — Progress Notes (Signed)
 Angela DOROTHA Jenkins, DO, ABFM, ABOM Bariatric physician [address] [place], [place], [place] [address] Office: [phone]  /  Fax: [phone]     Initial Evaluation:  Angela Ramirez was seen in clinic today to evaluate for obesity. She is interested in losing weight to improve overall health and reduce the risk of weight related complications. She presents today to review program treatment options, initial physical assessment, and evaluation.     She was referred by: PCP  When asked what they hope to accomplish? She states: improve medical conditions, reduce number of medications, and improve quality of life.   When asked how has your weight affected you? She states: Contributed to medical problems, Contributed to orthopedic problems or mobility issues, Having fatigue, and Has affected mood   Contributing factors to her weight change: family history of obesity, moderate to high levels of stress, reduced physical activity, and nutritional.  Some associated conditions: Bilateral arthritis of knees, HTN, mild OSA (diagnosed 15-17 years ago and was told she does not need treatment), Pre-DM, Kidney Disease (RAS)  Current nutrition plan: Low-carb and low saturated/trans fats   Current level of physical activity: None  Current or previous pharmacotherapy: None  Response to medication: Never tried medications   Past Medical History:  Diagnosis Date   Anxiety    Aortic atherosclerosis (HCC)    Arnold-Chiari malformation (HCC)    Arthritis    Cirrhosis (HCC)    Colon polyps    Depression    Diverticulitis    Diverticulosis    Emphysema of lung (HCC) 12/02/2020   per CT scan   Headache    Hepatitis C    Hiatal hernia    Hypertension    Hypertensive retinopathy    IBS (irritable bowel syndrome)    Internal hemorrhoids    Sepsis (HCC) 11/2020   UTI (urinary tract infection)     Current Outpatient Medications  Medication Instructions   cetirizine (ZYRTEC) 10 mg, Daily PRN    doxycycline  (VIBRAMYCIN ) 100 mg, Oral, 2 times daily   Fe Fum-FePoly-Vit C-Vit B3 (INTEGRA) 62.5-62.5-40-3 MG CAPS Take 1 cap daily for 3 months   hydrochlorothiazide  (HYDRODIURIL ) 25 mg, Oral, Daily   levalbuterol  (XOPENEX  HFA) 45 MCG/ACT inhaler 1-2 puffs, Inhalation, Every 6 hours PRN   linaclotide  (LINZESS ) 72 mcg, Oral, Daily before breakfast   meclizine  (ANTIVERT ) 12.5 mg, Oral, 3 times daily PRN   pantoprazole  (PROTONIX ) 40 mg, Oral, Daily before breakfast   promethazine -dextromethorphan (PROMETHAZINE -DM) 6.25-15 MG/5ML syrup 5 mLs, Oral, 4 times daily PRN   temazepam  (RESTORIL ) 15 mg, Oral, At bedtime PRN   terconazole  (TERAZOL 3 ) 0.8 % vaginal cream 1 applicator, Vaginal, Daily at bedtime   tiZANidine  (ZANAFLEX ) 2 MG tablet TAKE 1 TABLET(2 MG) BY MOUTH EVERY 8 HOURS AS NEEDED FOR MUSCLE SPASMS   traMADol  (ULTRAM ) 50 mg, Oral, Every 12 hours PRN     Allergies  Allergen Reactions   Crab (Diagnostic) Hives   Morphine And Codeine Nausea And Vomiting   Sulfa Antibiotics Nausea And Vomiting     Past Surgical History:  Procedure Laterality Date   BRAIN SURGERY  2003   decompression   BREAST EXCISIONAL BIOPSY Bilateral    age 35's   CATARACT EXTRACTION     DILATION AND CURETTAGE OF UTERUS     ESOPHAGOGASTRODUODENOSCOPY (EGD) WITH PROPOFOL  N/A 06/15/2023   Procedure: ESOPHAGOGASTRODUODENOSCOPY (EGD) WITH PROPOFOL ;  Surgeon: Albertus Gordy HERO, MD;  Location: [facility];  Service: Gastroenterology;  Laterality: N/A;   EYE SURGERY  OVARIAN CYST REMOVAL Bilateral    REDUCTION MAMMAPLASTY Bilateral    25+ years ago     Family History  Problem Relation Age of Onset   Arthritis Mother    Hypertension Mother    Kidney disease Mother    Diabetes Mother    Alcohol abuse Father    Arthritis Father    Lung cancer Father    Hypertension Sister    Rheum arthritis Sister    Diabetes Maternal Grandmother    Breast cancer Paternal Grandmother    Hypertension Daughter    Parkinson's  disease Son    Diabetes Maternal Aunt    Kidney failure Maternal Aunt    Diabetes Maternal Uncle    Breast cancer Paternal Aunt    Breast cancer Paternal Aunt    Colon cancer Neg Hx    Stomach cancer Neg Hx    Esophageal cancer Neg Hx      Objective:  BP (!) 160/83   Pulse 85   Temp 98.2 F (36.8 C)   Ht 5' 5.5 (1.664 m)   Wt 208 lb (94.3 kg)   SpO2 95%   BMI 34.09 kg/m  She was weighed on the bioimpedance scale: Body mass index is 34.09 kg/m.  Visceral Fat rating : 14, Body Fat %: 45.3  Weight Lost Since Last Visit: NA  Weight Gained Since Last Visit: NA   Vitals Temp: 98.2 F (36.8 C) BP: (!) 160/83 Pulse Rate: 85 SpO2: 95 %   Anthropometric Measurements Height: 5' 5.5 (1.664 m) Weight: 208 lb (94.3 kg) BMI (Calculated): 34.07 Weight at Last Visit: NA Weight Lost Since Last Visit: NA Weight Gained Since Last Visit: NA Starting Weight: NA Total Weight Loss (lbs): -- (NA) Peak Weight: 207lb Waist Measurement : -- (NA)   Body Composition  Body Fat %: 45.3 % Fat Mass (lbs): 94.2 lbs Muscle Mass (lbs): 108 lbs Total Body Water (lbs): 71 lbs Visceral Fat Rating : 14   Other Clinical Data A1c: -- (NA) RMR: -- (NA) Fasting: No Labs: No Today's Visit #: Consult Starting Date: -- (NA) Comments: Consult    General: Well Developed, well nourished, and in no acute distress.  HEENT: Normocephalic, atraumatic; EOMI, sclerae are anicteric. Skin: Warm and dry, good turgor Chest:  Normal excursion, shape, no gross ABN Respiratory: No conversational dyspnea; speaking in full sentences NeuroM-Sk:  Normal gross ROM * 4 extremities  Psych: A and O *3, insight adequate, mood- full    Assessment and Plan:    FOR THE DISEASE OF OBESITY:  Obesity (BMI 30.0-34.9) Assessment & Plan: We reviewed anthropometrics, biometrics, associated medical conditions and contributing factors with patient. Angela Ramirez would benefit from a medically tailored reduced calorie  nutrional plan based on their REE (resting energy expenditure), which will be determined by indirect calorimetry.  We will also assess for cardiometabolic risk and nutritional derangements via fasting labs at intake appointment.    Obesity Treatment / Action Plan:   she was weighed on the bioimpedance scale and results were discussed and documented in the synopsis.   Angela Ramirez will complete provided nutritional and psychosocial assessment questionnaire before the next appointment.  she will be scheduled for indirect calorimetry to determine resting energy expenditure in a fasting state.  This will allow us  to create a reduced calorie, high-protein meal plan to promote loss of fat mass while preserving muscle mass.  We will also assess for cardiometabolic risk and nutritional derangements via an ECG and fasting serologies at her  next appointment.  she was encouraged to work on amassing support from family and friends to begin their weight loss journey.   Work on eliminating or reducing the presence of highly processed, poorly nutritious, calorie-dense foods in the home.   Obesity Education Performed Today:  Patient was counseled on nutritional approaches to weight loss and benefits of reducing processed foods and consuming plant-based foods and high quality protein as part of nutritional weight management program.   We discussed the importance of long term lifestyle changes which include nutrition, exercise and behavioral modifications as well as the importance of customizing this to her specific health and social needs.   We discussed the benefits of reaching a healthier weight to alleviate the symptoms of existing conditions and reduce the risks of the biomechanical, metabolic and psychological effects of obesity.  Was counseled on the health benefits of losing 5%-10% of total body weight.  Was counseled on our cognitive behavorial therapy program, lead by our bariatric  psychologist, who focuses on emotional eating and creating positive behavorial change.  Was counseled on bariatric pharmacotherapy and how this may be used as an adjunct in their weight management   Angela Ramirez appears to be in the action stage of change and states they are ready to start intensive lifestyle modifications and behavioral modifications.  It was recommended that she follow up in the next 1-2 weeks to review the above steps, and to continue with treatment of their chronic disease state of obesity   FOR OTHER CONDITIONS RELATED TO THE DISEASE OF OBESITY:   Renal artery stenosis (HCC) Essential hypertension Assessment & Plan: Last 3 blood pressure readings in our office are as follows: BP Readings from Last 3 Encounters:  02/02/24 (!) 160/83  01/13/24 (!) 170/90  12/22/23 (!) 140/80   The 10-year ASCVD risk score (Arnett DK, et al., 2019) is: 18.5%  Lab Results  Component Value Date   CREATININE 0.66 06/15/2023   CT angiogram bleeding study noted greater than 50% stenosis of right renal artery at its origin. Pt states she was diagnosed with HTN about 5 years ago; her BP has been elevated consistently since then. Her BP was above target today as well; pt asx. She does not check her BP at home. She no longer takes Amlodipine  but continues to take hydrochlorothiazide  25 mg daily. Of note, her serum creatinine was 0.66 and eGFR was >60 on 06/15/23.   - Discussed the potential impact of poorly controlled BP on increased cardiovascular risk and overall health.  - Check BP at home 3-4 times per week.   - Goal BP: 140/90 or less on a regular basis.  - Maintain with current regimen; f/up with PCP as directed. - Losing 10% or more of adipose tissue may improve condition    Prediabetes Assessment & Plan: Lab Results  Component Value Date   HGBA1C 6.0 01/27/2023   HGBA1C 6.0 11/10/2020   HGBA1C 5.9 02/19/2019    She has a history of Pre-DM which was first diagnosed in 2019 with  a Hemoglobin A1c of 5.8. Her Pre-DM has been gradually worsening since then with most recent A1c of 6.0. She is on no pre-DM medications.   - She would benefit from a balanced diet consisting of lean proteins, fruits, and vegetables while limiting simple carbohydrates.  - Check fasting blood glucose, insulin  levels, and A1c at next OV.  - Will counsel patient on pathophysiology of disease process at 1st follow up visit.     Attestations:  I, Special Derek, acting as a Stage manager for [employer], DO., have compiled all relevant documentation for today's office visit on behalf of Angela Jenkins, DO, while in the presence of [employer], DO.  I have spent 53 minutes in the care of the patient today including 43 minutes on face to face counseling of the patient on the disease of obesity and what our program can do for their medical conditions as well as in preventing future diseases. I discussed the importance of comprehensive care in the treatment of obesity including mental well being and physical activity  I have reviewed the above documentation for accuracy and completeness, and I agree with the above. Angela JINNY Ramirez, D.O.  The 21st Century Cures Act was signed into law in 2016 which includes the topic of electronic health records.  This provides immediate access to information in MyChart.  This includes consultation notes, operative notes, office notes, lab results and pathology reports.  If you have any questions about what you read please let us  know at your next visit so we can discuss your concerns and take corrective action if need be.  We are right here with you!

## 2024-02-11 NOTE — Progress Notes (Unsigned)
 [address] [place],  [place], [place] [address]  Office: [phone]  /  Fax: [phone]   Subjective   Initial Visit  Angela Ramirez (MR# [id number]) is a 78 y.o. female who presents for evaluation and treatment of obesity and related comorbidities. Current BMI is There is no height or weight on file to calculate BMI. Angela Ramirez has been struggling with her weight for many years and has been unsuccessful in either losing weight, maintaining weight loss, or reaching her healthy weight goal.  Angela Ramirez is currently in the action stage of change and ready to dedicate time achieving and maintaining a healthier weight. Angela Ramirez is interested in becoming our patient and working on intensive lifestyle modifications including (but not limited to) diet and exercise for weight loss.  Angela Ramirez does deal with moderate to severe osteoarthritis of knees bilaterally. She is followed by Dr. Leonce orthopedic, sports medicine. She has a history of GI bleed in 05/2023 so avoids NSAIDs and uses Tylenol  for pain.  She does receive monovisc injections to knees bilaterally with last 01/28/24. CT angiogram bleeding study 06/13/23 noted greater than 50% stenosis of right renal artery at its origin. Pt states she was diagnosed with HTN about 5 years ago; her BP has been elevated consistently since then.  She no longer takes Amlodipine  but continues to take hydrochlorothiazide  25 mg daily. Of note, her serum creatinine was 0.66 and eGFR was >60 on 06/15/23.  Aortic atherosclerosis was also noticed on CT 05/30/21 She also has exudative age related macular degeneration of eyes bilaterally , hypertensive retinopathy bilaterally and is followed by Dr. Zamor ophthalmology.She receives injections q 14 weeks.   U/S 12/09/18 showed possible cirrhosis changes to liver but abdominal CT 05/30/21 showed normal liver as well as on CT angiogram on 06/13/23. She did have Chronic Hepatitis C that was treated with Epclusa  for 12 weeks in 2019. Also noted  to have F3/4 on elastography but no cirrhosis in 2019.    Weight history:  When asked how their weight has affected their life and health, she states: {EMWeightAffected:28305}  When asked what else they would like to accomplish? She states: {EMHopetoaccomplish:28304::Adopt a healthier eating pattern and lifestyle,Improve energy levels and physical activity,Improve existing medical conditions,Improve quality of life}  She starting to note weight gain during : {emstartedtogainweight:31588}.  Life events associated with weight gain include : {emlifeeventsweighgain:31589}.   Other contributing factors: {EMcontributingfactors:28307}.  Their highest weight has been:  *** lbs.  Desired weight: ***  Previous weight-loss programs : {emweightlossprograms:31590::None}.  Their maximum weight loss was:  *** lbs.  Their greatest challenge with dieting: {emgreatestchallengediet:31593}.  Current or previous pharmacotherapy: {EM previousRx:28311}.  Response to medication: {EMResponsetomedication:28312}   Nutritional History:  Current nutrition plan: {EMNutritionplan:28309::None}.  How many times do you eat outside the home: {emfrequency:31645}  How often do they skip meals: {emskipmeals:31594::does not skip meals}  What beverages do they drink: {embeverages:31595}.   Use of artificial sweetners : {Yes/No:30480221}  Food intolerances or dislikes: {emfoodintolerance:31596::none}.  Food triggers: {emfoodtriggers:31600::None}.  Food cravings: {emfoodcravings:31601}  Do they struggle with excessive hunger or portion control : {YES/NO:21197}   Physical Activity:  Current level of physical activity: {EMcurrentPA:28310::None}  Barriers to Exercise: {embarrierstoexercise:32606::no barriers}   Past medical history includes:   Past Medical History:  Diagnosis Date   Anxiety    Aortic atherosclerosis (HCC)    Arnold-Chiari malformation (HCC)    Arthritis     Cirrhosis (HCC)    Colon polyps    Depression    Diverticulitis  Diverticulosis    Emphysema of lung (HCC) 12/02/2020   per CT scan   Headache    Hepatitis C    Hiatal hernia    Hypertension    Hypertensive retinopathy    IBS (irritable bowel syndrome)    Internal hemorrhoids    Sepsis (HCC) 11/2020   UTI (urinary tract infection)      Objective   There were no vitals taken for this visit. She was weighed on the bioimpedance scale: There is no height or weight on file to calculate BMI.    Anthropometrics:  No data recorded No data recorded No data recorded No data recorded  Physical Exam:  General: She is overweight, cooperative, alert, well developed, and in no acute distress. PSYCH: Has normal mood, affect and thought process.   HEENT: EOMI, sclerae are anicteric. Lungs: Normal breathing effort, no conversational dyspnea. Extremities: No edema.  Neurologic: No gross sensory or motor deficits. No tremors or fasciculations noted.    Diagnostic Data Reviewed  EKG: Normal sinus rhythm, rate ***. No conduction abnormalities, abnormal Q waves or chamber enlargement.  Indirect Calorimeter completed today shows a VO2 of *** and a REE of ***.  Her calculated basal metabolic rate is *** thus her {zfprmzdlou:68397}.  Depression Screen  Angela Ramirez's PHQ-9 score was: ***.     12/22/2023    9:25 AM  Depression screen PHQ 2/9  Decreased Interest 0  Down, Depressed, Hopeless 0  PHQ - 2 Score 0  Altered sleeping 3  Tired, decreased energy 3  Change in appetite 2  Feeling bad or failure about yourself  0  Trouble concentrating 0  Moving slowly or fidgety/restless 0  Suicidal thoughts 0  PHQ-9 Score 8  Difficult doing work/chores Not difficult at all    Screening for Sleep Related Breathing Disorders  Angela Ramirez {Actions; denies/reports/admits to:19208} daytime somnolence and {Actions; denies/reports/admits to:19208} waking up still tired. Patient has a history of  symptoms of {OSA Sx:17850}. Angela Ramirez generally gets {numbers (fuzzy):14653} hours of sleep per night, and states that she has {sleep quality:17851}. Snoring {is/are not:32546} present. Apneic episodes {is/are not:32546} present. Epworth Sleepiness Score is ***.   BMET    Component Value Date/Time   NA 139 06/15/2023 0631   K 3.4 (L) 06/15/2023 0631   CL 101 06/15/2023 0631   CO2 25 06/15/2023 0631   GLUCOSE 100 (H) 06/15/2023 0631   BUN 11 06/15/2023 0631   CREATININE 0.66 06/15/2023 0631   CREATININE 0.80 04/09/2018 1011   CALCIUM 9.0 06/15/2023 0631   GFRNONAA >60 06/15/2023 0631   GFRNONAA 75 04/09/2018 1011   GFRAA 87 04/09/2018 1011   Lab Results  Component Value Date   HGBA1C 6.0 01/27/2023   HGBA1C 5.8 08/18/2017   No results found for: INSULIN  CBC    Component Value Date/Time   WBC 7.6 10/29/2023 1049   RBC 4.61 10/29/2023 1049   HGB 12.7 10/29/2023 1049   HCT 38.4 10/29/2023 1049   PLT 264.0 10/29/2023 1049   MCV 83.5 10/29/2023 1049   MCH 28.5 06/15/2023 0631   MCHC 33.0 10/29/2023 1049   RDW 17.0 (H) 10/29/2023 1049   Iron/TIBC/Ferritin/ %Sat    Component Value Date/Time   IRON 53 10/29/2023 1049   TIBC 365.4 10/29/2023 1049   FERRITIN 19.3 10/29/2023 1049   IRONPCTSAT 14.5 (L) 10/29/2023 1049   Lipid Panel     Component Value Date/Time   CHOL 173 01/27/2023 1016   TRIG 79.0 01/27/2023 1016   HDL 55.70 01/27/2023  1016   CHOLHDL 3 01/27/2023 1016   VLDL 15.8 01/27/2023 1016   LDLCALC 101 (H) 01/27/2023 1016   Hepatic Function Panel     Component Value Date/Time   PROT 7.3 06/13/2023 0050   ALBUMIN 4.2 06/13/2023 0050   AST 17 06/13/2023 0050   ALT 7 06/13/2023 0050   ALT 25 09/17/2017 1435   ALKPHOS 83 06/13/2023 0050   BILITOT 0.4 06/13/2023 0050      Component Value Date/Time   TSH 1.45 01/27/2023 1016     Assessment and Plan   TREATMENT PLAN FOR OBESITY:  Recommended Dietary Goals  Mylinh is currently in the action stage of  change. As such, her goal is to implement medically supervised obesity management plan.  She has agreed to implement: {emwtlossplannewint:31639}  Behavioral Intervention  We discussed the following Behavioral Modification Strategies today: {EMwtlossstrategiesnewint:31640::increasing lean protein intake to established goals,decreasing simple carbohydrates ,increasing vegetables,increasing lower glycemic fruits,increasing fiber rich foods,avoiding skipping meals,increasing water intake,work on meal planning and preparation,reading food labels ,keeping healthy foods at home,identifying sources and decreasing liquid calories,decreasing eating out or consumption of processed foods, and making healthy choices when eating convenient foods,planning for success,better snacking choices}  Additional resources provided today: {emadditionalresourcesnewint:32116::Handout on healthy eating and balanced plate,Handout on complex carbohydrates and lean sources of protein,Handout principles of weight management}  Recommended Physical Activity Goals  Angela Ramirez has been advised to work up to 150 minutes of moderate intensity aerobic activity a week and strengthening exercises 2-3 times per week for cardiovascular health, weight loss maintenance and preservation of muscle mass.   She has agreed to :  {EMEXERCISE:28847::Think about enjoyable ways to increase daily physical activity and overcoming barriers to exercise,Increase physical activity in their day and reduce sedentary time (increase NEAT).}  Medical Interventions and Pharmacotherapy We will work on building a Therapist, art and behavioral strategies. We will discuss the role of pharmacotherapy as an adjunct at subsequent visits.   ASSOCIATED CONDITIONS ADDRESSED TODAY  Other Fatigue ***. Angela Ramirez does feel that her weight is causing her energy to be lower than it should be. Fatigue may be related to  obesity, depression or many other causes. Labs will be ordered, and in the meanwhile, Angela Ramirez will focus on self care including making healthy food choices, increasing physical activity and focusing on stress reduction.  Shortness of Breath Angela Ramirez notes increasing shortness of breath with physical activity and seems to be worsening over time with weight gain. She notes getting out of breath sooner with activity than she used to. This has not gotten worse recently. Angela Ramirez denies shortness of breath at rest or orthopnea.  There are no diagnoses linked to this encounter.  Follow-up  She was informed of the importance of frequent follow-up visits to maximize her success with intensive lifestyle modifications for her multiple health conditions. She was informed we would discuss her lab results at her next visit unless there is a critical issue that needs to be addressed sooner. Angela Ramirez agreed to keep her next visit at the agreed upon time to discuss these results.  Attestation Statement  This is the patient's intake visit at [facility]. The patient's Health Questionnaire was reviewed at length. Included in the packet: current and past health history, medications, allergies, ROS, gynecologic history (women only), surgical history, family history, social history, weight history, weight loss surgery history (for those that have had weight loss surgery), nutritional evaluation, mood and food questionnaire, PHQ9, Epworth questionnaire, sleep habits questionnaire, patient life and health  improvement goals questionnaire. These will all be scanned into the patient's chart under media.   During the visit, I independently reviewed the patient's EKG***, previous labs, bioimpedance scale results, and indirect calorimetry results. I used this information to medically tailor a meal plan for the patient that will help her to lose weight and will improve her obesity-related conditions. I performed a  medically necessary appropriate examination and/or evaluation. I discussed the assessment and treatment plan with the patient. The patient was provided an opportunity to ask questions and all were answered. The patient agreed with the plan and demonstrated an understanding of the instructions. Labs were ordered at this visit and will be reviewed at the next visit unless critical results need to be addressed immediately. Clinical information was updated and documented in the EMR.   In addition, they received basic education on identification of processed foods and reduction of these, different sources of lean proteins and complex carbohydrates and how to eat balanced by incorporation of whole foods.  Reviewed by clinician on day of visit: allergies, medications, problem list, medical history, surgical history, family history, social history, and previous encounter notes.  I have spent *** minutes in the care of the patient today including: {NUMBER 1-10:22536} minutes before the visit reviewing and preparing the chart. *** minutes face-to-face {emfacetoface:32598::assessing and reviewing listed medical problems as outlined in obesity care plan,providing nutritional and behavioral counseling on topics outlined in the obesity care plan,independently interpreting test results and goals of care, as described in assessment and plan,reviewing and discussing biometric information and progress} {NUMBER 1-10:22536} minutes after the visit updating chart and documentation of encounter.       Fishel Wamble ANP-C

## 2024-02-12 ENCOUNTER — Ambulatory Visit (INDEPENDENT_AMBULATORY_CARE_PROVIDER_SITE_OTHER): Admitting: Nurse Practitioner

## 2024-02-12 ENCOUNTER — Encounter (INDEPENDENT_AMBULATORY_CARE_PROVIDER_SITE_OTHER): Payer: Self-pay | Admitting: Nurse Practitioner

## 2024-02-12 VITALS — BP 181/75 | HR 86 | Temp 98.0°F | Ht 66.0 in | Wt 206.0 lb

## 2024-02-12 DIAGNOSIS — I701 Atherosclerosis of renal artery: Secondary | ICD-10-CM | POA: Diagnosis not present

## 2024-02-12 DIAGNOSIS — E559 Vitamin D deficiency, unspecified: Secondary | ICD-10-CM

## 2024-02-12 DIAGNOSIS — K7400 Hepatic fibrosis, unspecified: Secondary | ICD-10-CM

## 2024-02-12 DIAGNOSIS — R5383 Other fatigue: Secondary | ICD-10-CM | POA: Diagnosis not present

## 2024-02-12 DIAGNOSIS — I10 Essential (primary) hypertension: Secondary | ICD-10-CM

## 2024-02-12 DIAGNOSIS — H35033 Hypertensive retinopathy, bilateral: Secondary | ICD-10-CM

## 2024-02-12 DIAGNOSIS — R7303 Prediabetes: Secondary | ICD-10-CM

## 2024-02-12 DIAGNOSIS — B182 Chronic viral hepatitis C: Secondary | ICD-10-CM

## 2024-02-12 DIAGNOSIS — R0602 Shortness of breath: Secondary | ICD-10-CM | POA: Diagnosis not present

## 2024-02-12 DIAGNOSIS — M170 Bilateral primary osteoarthritis of knee: Secondary | ICD-10-CM

## 2024-02-12 DIAGNOSIS — Z6833 Body mass index (BMI) 33.0-33.9, adult: Secondary | ICD-10-CM

## 2024-02-12 DIAGNOSIS — Z1331 Encounter for screening for depression: Secondary | ICD-10-CM | POA: Diagnosis not present

## 2024-02-12 DIAGNOSIS — K219 Gastro-esophageal reflux disease without esophagitis: Secondary | ICD-10-CM

## 2024-02-12 DIAGNOSIS — E66811 Obesity, class 1: Secondary | ICD-10-CM | POA: Diagnosis not present

## 2024-02-12 DIAGNOSIS — Z6834 Body mass index (BMI) 34.0-34.9, adult: Secondary | ICD-10-CM

## 2024-02-12 NOTE — Addendum Note (Signed)
 Addended by: CHESLEY NIELS CROME on: 02/12/2024 09:49 AM   Modules accepted: Orders

## 2024-02-13 ENCOUNTER — Other Ambulatory Visit: Payer: Self-pay | Admitting: Internal Medicine

## 2024-02-13 LAB — CBC WITH DIFFERENTIAL/PLATELET
Basophils Absolute: 0.1 x10E3/uL (ref 0.0–0.2)
Basos: 1 %
EOS (ABSOLUTE): 0.2 x10E3/uL (ref 0.0–0.4)
Eos: 2 %
Hematocrit: 42.4 % (ref 34.0–46.6)
Hemoglobin: 13.6 g/dL (ref 11.1–15.9)
Immature Grans (Abs): 0 x10E3/uL (ref 0.0–0.1)
Immature Granulocytes: 0 %
Lymphocytes Absolute: 1.4 x10E3/uL (ref 0.7–3.1)
Lymphs: 20 %
MCH: 28.9 pg (ref 26.6–33.0)
MCHC: 32.1 g/dL (ref 31.5–35.7)
MCV: 90 fL (ref 79–97)
Monocytes Absolute: 0.4 x10E3/uL (ref 0.1–0.9)
Monocytes: 6 %
Neutrophils Absolute: 4.8 x10E3/uL (ref 1.4–7.0)
Neutrophils: 71 %
Platelets: 263 x10E3/uL (ref 150–450)
RBC: 4.71 x10E6/uL (ref 3.77–5.28)
RDW: 14.8 % (ref 11.7–15.4)
WBC: 6.8 x10E3/uL (ref 3.4–10.8)

## 2024-02-13 LAB — COMPREHENSIVE METABOLIC PANEL WITH GFR
ALT: 14 IU/L (ref 0–32)
AST: 25 IU/L (ref 0–40)
Albumin: 4.7 g/dL (ref 3.8–4.8)
Alkaline Phosphatase: 103 IU/L (ref 44–121)
BUN/Creatinine Ratio: 17 (ref 12–28)
BUN: 13 mg/dL (ref 8–27)
Bilirubin Total: 0.5 mg/dL (ref 0.0–1.2)
CO2: 22 mmol/L (ref 20–29)
Calcium: 10.1 mg/dL (ref 8.7–10.3)
Chloride: 94 mmol/L — ABNORMAL LOW (ref 96–106)
Creatinine, Ser: 0.76 mg/dL (ref 0.57–1.00)
Globulin, Total: 3.2 g/dL (ref 1.5–4.5)
Glucose: 87 mg/dL (ref 70–99)
Potassium: 3.8 mmol/L (ref 3.5–5.2)
Sodium: 134 mmol/L (ref 134–144)
Total Protein: 7.9 g/dL (ref 6.0–8.5)
eGFR: 82 mL/min/1.73 (ref >59)

## 2024-02-13 LAB — HEMOGLOBIN A1C
Est. average glucose Bld gHb Est-mCnc: 123 mg/dL
Hgb A1c MFr Bld: 5.9 % — ABNORMAL HIGH (ref 4.8–5.6)

## 2024-02-13 LAB — LIPID PANEL
Chol/HDL Ratio: 2.7 ratio (ref 0.0–4.4)
Cholesterol, Total: 195 mg/dL (ref 100–199)
HDL: 72 mg/dL (ref >39)
LDL Chol Calc (NIH): 108 mg/dL — ABNORMAL HIGH (ref 0–99)
Triglycerides: 81 mg/dL (ref 0–149)
VLDL Cholesterol Cal: 15 mg/dL (ref 5–40)

## 2024-02-13 LAB — MAGNESIUM: Magnesium: 2.1 mg/dL (ref 1.6–2.3)

## 2024-02-13 LAB — VITAMIN D 25 HYDROXY (VIT D DEFICIENCY, FRACTURES): Vit D, 25-Hydroxy: 108 ng/mL — ABNORMAL HIGH (ref 30.0–100.0)

## 2024-02-13 LAB — TSH: TSH: 1.73 u[IU]/mL (ref 0.450–4.500)

## 2024-02-13 LAB — INSULIN, RANDOM: INSULIN: 10.9 u[IU]/mL (ref 2.6–24.9)

## 2024-02-13 LAB — VITAMIN B12: Vitamin B-12: 831 pg/mL (ref 232–1245)

## 2024-02-16 ENCOUNTER — Ambulatory Visit (INDEPENDENT_AMBULATORY_CARE_PROVIDER_SITE_OTHER): Payer: Self-pay | Admitting: Nurse Practitioner

## 2024-02-26 ENCOUNTER — Encounter (INDEPENDENT_AMBULATORY_CARE_PROVIDER_SITE_OTHER): Payer: Self-pay | Admitting: Nurse Practitioner

## 2024-02-26 ENCOUNTER — Ambulatory Visit (INDEPENDENT_AMBULATORY_CARE_PROVIDER_SITE_OTHER): Admitting: Nurse Practitioner

## 2024-02-26 VITALS — BP 136/78 | HR 81 | Temp 97.8°F | Ht 66.0 in | Wt 204.0 lb

## 2024-02-26 DIAGNOSIS — M170 Bilateral primary osteoarthritis of knee: Secondary | ICD-10-CM

## 2024-02-26 DIAGNOSIS — I10 Essential (primary) hypertension: Secondary | ICD-10-CM | POA: Diagnosis not present

## 2024-02-26 DIAGNOSIS — E559 Vitamin D deficiency, unspecified: Secondary | ICD-10-CM | POA: Diagnosis not present

## 2024-02-26 DIAGNOSIS — E88819 Insulin resistance, unspecified: Secondary | ICD-10-CM

## 2024-02-26 DIAGNOSIS — Z6833 Body mass index (BMI) 33.0-33.9, adult: Secondary | ICD-10-CM

## 2024-02-26 DIAGNOSIS — R7303 Prediabetes: Secondary | ICD-10-CM

## 2024-02-26 DIAGNOSIS — E66811 Obesity, class 1: Secondary | ICD-10-CM

## 2024-02-26 MED ORDER — METFORMIN HCL ER 500 MG PO TB24: ORAL_TABLET | ORAL | 0 refills | Status: DC

## 2024-02-26 NOTE — Progress Notes (Signed)
 Office: [phone]  /  Fax: [phone]  Weight Summary And Biometrics  Vitals Temp: 97.8 F (36.6 C) BP: 136/78 Pulse Rate: 81 SpO2: 97 %   Anthropometric Measurements Height: 5' 6 (1.676 m) Weight: 204 lb (92.5 kg) BMI (Calculated): 32.94 Weight at Last Visit: 206 lb Weight Lost Since Last Visit: 2 lb Weight Gained Since Last Visit: 0 Starting Weight: 206 lb Total Weight Loss (lbs): 2 lb (0.907 kg) Peak Weight: 207 lb Waist Measurement : 44 inches   Body Composition  Body Fat %: 44.8 % Fat Mass (lbs): 91.6 lbs Muscle Mass (lbs): 107 lbs Total Body Water (lbs): 70.4 lbs Visceral Fat Rating : 14    RMR: 1584  Today's Visit #: 2  Starting Date: 02/12/24  TOTAL WEIGHT LOSS: 2 pounds  Subjective   Chief Complaint: Obesity  Angela Ramirez is here to discuss her progress with her obesity treatment plan. Angela Ramirez is on the the Category 2 Plan and states Angela Ramirez is following her eating plan approximately 95 % of the time. Angela Ramirez states Angela Ramirez is exercising 30 minutes 7 times per week.  Interval History:  Angela Ramirez has started increasing her walking and is currently doing 30 minutes of walking 7 days a week. Angela Ramirez is also planning to start line dancing and zumba class in water.  Angela Ramirez continues to have a lot of stress in her life with sorry for her son who has parkinson's.Angela Ramirez has been difficulty with sleep.  Angela Ramirez does deal with osteoarthritis of knees bilaterally, hypertension and prediabetes.  Angela Ramirez is currently on hydrochlorothiazide  25 mg every day and amlodipine  10 mg and taking 1/2 tab and BP is not controlled, follows with PCP BP Readings from Last 3 Encounters:  02/26/24 136/78  02/12/24 (!) 181/75  02/02/24 (!) 160/83    Vit D is elevated at 108 and has been taking Vit D3K2 nightly. Angela Ramirez continues to follow with Dr. Leonce for knee injections. Limits NSAIDs due to previous GI Bleed. Has been able to do more activity since getting injections.  Angela Ramirez has a history of prediabetes and is  currently on no medication.  Most recent A1c 5.9. Her most recent lab work did reveal insulin  resistance with A HOMA-IR score of 2.34 Since last office visit Angela Ramirez has lost 2 pounds. Angela Ramirez reports excellent adherence to reduced calorie nutritional plan. Angela Ramirez has been working on reading food labels, not skipping meals, increasing protein intake at every meal, drinking more water, making healthier choices, reducing portion sizes, and incorporating more whole foods  Pap: none in years.  Colonoscopy:11/01/2019 repeat due 2031 Mammogram: 2021, due now     Symptom Review:  Denies problems with appetite and hunger signals.  Denies problems with satiety and satiation.  Denies problems with eating patterns and portion control.  Denies abnormal cravings. Denies feeling deprived or restricted. Energy Levels: high Sleep Quality: Poor   Barriers identified: none.   Pharmacotherapy for weight loss: Angela Ramirez is currently taking no anti-obesity medication.   Assessment and Plan   Treatment Plan For Obesity:  Recommended Dietary Goals  Angela Ramirez is currently in the action stage of change. As such, her goal is to continue weight management plan. Angela Ramirez has agreed to: follow the Category 2 plan - 1200 kcal per day and continue current plan  Behavioral Intervention  We discussed the following Behavioral Modification Strategies today: continue to work on maintaining a reduced calorie state, getting the recommended amount of protein, incorporating whole foods, making healthy choices, staying well hydrated and practicing mindfulness when  eating. and increase protein intake, fibrous foods (25 grams per day for women, 30 grams for men) and water to improve satiety and decrease hunger signals. .  Additional resources provided today: None  Recommended Physical Activity Goals  Angela Ramirez has been advised to work up to 150 minutes of moderate intensity aerobic activity a week and strengthening exercises 2-3 times per week  for cardiovascular health, weight loss maintenance and preservation of muscle mass.   Angela Ramirez has agreed to :  Think about enjoyable ways to increase daily physical activity and overcoming barriers to exercise, Increase physical activity in their day and reduce sedentary time (increase NEAT)., and Start aerobic activity with a goal of 150 minutes a week at moderate intensity.   Pharmacotherapy  We discussed various medication options to help Angela Ramirez with her weight loss efforts and we both agreed to :Start Metformin  XR 500 mg 1 tab daily with largest meal for insulin  resistance and off label for weight loss.  Associated Conditions Addressed Today  Essential hypertension Continue hydrochlorothiazide  25 mg every day and Amlodipine  10 mg 1/2 tab daily Continue DASH diet and category 2 meal plan, continue focusing on weight loss Monitor BP and continue to follow with PCP  Prediabetes Insulin  resistance Continue Category 2 meal plan Limit simple carbs Focus on continuing walking 210 minutes a week, add Zumba and line dance class as tolerated Start Metformin  XR 500 mg 1 tab with largest meal daily for insulin  resistance, prediabetes and off label for weight loss -     metFORMIN  HCl ER; Take 1 tablet daily with largest meal.  Dispense: 30 tablet; Refill: 0  Localized osteoarthritis of knees, bilateral Continue no follow with Dr. Leonce, orthopedics Increase activity as tolerated Continue Category 2 meal plan and focus on weight loss  Class 1 obesity with serious comorbidity and body mass index (BMI) of 33.0 to 33.9 in adult, unspecified obesity type Continue Category 2 meal plan, focus on limiting simple carbs and saturated fats Focus on continuing to walk 210 minutes a week, add Zumba and line dance class as tolerated Start Metformin  off label for weight loss.  Reviewed all lab work and cancer screenings- encouraged to get mammogram  Vit D deficiency Instructed to decrease VIT D3K@ to every  other night as Vit D was 108    Objective   Physical Exam:  Blood pressure 136/78, pulse 81, temperature 97.8 F (36.6 C), height 5' 6 (1.676 m), weight 204 lb (92.5 kg), SpO2 97%. Body mass index is 32.93 kg/m.  General: Angela Ramirez is overweight, cooperative, alert, well developed, and in no acute distress. PSYCH: Has normal mood, affect and thought process.   HEENT: EOMI, sclerae are anicteric. Lungs: Normal breathing effort, no conversational dyspnea. Extremities: No edema.  Neurologic: No gross sensory or motor deficits. No tremors or fasciculations noted.    Diagnostic Data Reviewed:  BMET    Component Value Date/Time   NA 134 02/12/2024 0940   K 3.8 02/12/2024 0940   CL 94 (L) 02/12/2024 0940   CO2 22 02/12/2024 0940   GLUCOSE 87 02/12/2024 0940   GLUCOSE 100 (H) 06/15/2023 0631   BUN 13 02/12/2024 0940   CREATININE 0.76 02/12/2024 0940   CREATININE 0.80 04/09/2018 1011   CALCIUM 10.1 02/12/2024 0940   GFRNONAA >60 06/15/2023 0631   GFRNONAA 75 04/09/2018 1011   GFRAA 87 04/09/2018 1011   Lab Results  Component Value Date   HGBA1C 5.9 (H) 02/12/2024   HGBA1C 5.8 08/18/2017   A1c remains  prediabetic  Lab Results  Component Value Date   INSULIN  10.9 02/12/2024   HOMA-IR SCORE 2.34  Lab Results  Component Value Date   TSH 1.730 02/12/2024   CBC    Component Value Date/Time   WBC 6.8 02/12/2024 0940   WBC 7.6 10/29/2023 1049   RBC 4.71 02/12/2024 0940   RBC 4.61 10/29/2023 1049   HGB 13.6 02/12/2024 0940   HCT 42.4 02/12/2024 0940   PLT 263 02/12/2024 0940   MCV 90 02/12/2024 0940   MCH 28.9 02/12/2024 0940   MCH 28.5 06/15/2023 0631   MCHC 32.1 02/12/2024 0940   MCHC 33.0 10/29/2023 1049   RDW 14.8 02/12/2024 0940   Iron Studies    Component Value Date/Time   IRON 53 10/29/2023 1049   TIBC 365.4 10/29/2023 1049   FERRITIN 19.3 10/29/2023 1049   IRONPCTSAT 14.5 (L) 10/29/2023 1049   Lipid Panel     Component Value Date/Time   CHOL 195  02/12/2024 0940   TRIG 81 02/12/2024 0940   HDL 72 02/12/2024 0940   CHOLHDL 2.7 02/12/2024 0940   CHOLHDL 3 01/27/2023 1016   VLDL 15.8 01/27/2023 1016   LDLCALC 108 (H) 02/12/2024 0940   Hepatic Function Panel     Component Value Date/Time   PROT 7.9 02/12/2024 0940   ALBUMIN 4.7 02/12/2024 0940   AST 25 02/12/2024 0940   ALT 14 02/12/2024 0940   ALT 25 09/17/2017 1435   ALKPHOS 103 02/12/2024 0940   BILITOT 0.5 02/12/2024 0940      Component Value Date/Time   TSH 1.730 02/12/2024 0940   Nutritional Lab Results  Component Value Date   VD25OH 108.0 (H) 02/12/2024   VD25OH 41.24 01/27/2023   VD25OH 35.90 10/16/2021  Vitamin D  is elevated- supplementation of Vit D3K2 5000 units nightly  Follow-Up   No follow-ups on file.SABRA Angela Ramirez was informed of the importance of frequent follow up visits to maximize her success with intensive lifestyle modifications for her multiple health conditions.  Attestation Statement   Reviewed by clinician on day of visit: allergies, medications, problem list, medical history, surgical history, family history, social history, and previous encounter notes.    Angela Ramirez ANP-C

## 2024-03-02 ENCOUNTER — Ambulatory Visit

## 2024-03-02 ENCOUNTER — Other Ambulatory Visit (INDEPENDENT_AMBULATORY_CARE_PROVIDER_SITE_OTHER)

## 2024-03-02 ENCOUNTER — Telehealth: Payer: Self-pay

## 2024-03-02 ENCOUNTER — Other Ambulatory Visit: Payer: Self-pay

## 2024-03-02 DIAGNOSIS — D62 Acute posthemorrhagic anemia: Secondary | ICD-10-CM | POA: Diagnosis not present

## 2024-03-02 DIAGNOSIS — Z8719 Personal history of other diseases of the digestive system: Secondary | ICD-10-CM | POA: Diagnosis not present

## 2024-03-02 LAB — IBC + FERRITIN
Ferritin: 26.1 ng/mL (ref 10.0–291.0)
Iron: 30 ug/dL — ABNORMAL LOW (ref 42–145)
Saturation Ratios: 8.9 % — ABNORMAL LOW (ref 20.0–50.0)
TIBC: 338.8 ug/dL (ref 250.0–450.0)
Transferrin: 242 mg/dL (ref 212.0–360.0)

## 2024-03-02 LAB — CBC WITH DIFFERENTIAL/PLATELET
Basophils Absolute: 0 K/uL (ref 0.0–0.1)
Basophils Relative: 0.5 % (ref 0.0–3.0)
Eosinophils Absolute: 0.1 K/uL (ref 0.0–0.7)
Eosinophils Relative: 1.6 % (ref 0.0–5.0)
HCT: 39.6 % (ref 36.0–46.0)
Hemoglobin: 13.2 g/dL (ref 12.0–15.0)
Lymphocytes Relative: 23.2 % (ref 12.0–46.0)
Lymphs Abs: 1.7 K/uL (ref 0.7–4.0)
MCHC: 33.3 g/dL (ref 30.0–36.0)
MCV: 88.4 fl (ref 78.0–100.0)
Monocytes Absolute: 0.4 K/uL (ref 0.1–1.0)
Monocytes Relative: 5.4 % (ref 3.0–12.0)
Neutro Abs: 5 K/uL (ref 1.4–7.7)
Neutrophils Relative %: 69.3 % (ref 43.0–77.0)
Platelets: 276 K/uL (ref 150.0–400.0)
RBC: 4.48 Mil/uL (ref 3.87–5.11)
RDW: 14.8 % (ref 11.5–15.5)
WBC: 7.2 K/uL (ref 4.0–10.5)

## 2024-03-02 LAB — COMPREHENSIVE METABOLIC PANEL WITH GFR
ALT: 13 U/L (ref 0–35)
AST: 20 U/L (ref 0–37)
Albumin: 4.3 g/dL (ref 3.5–5.2)
Alkaline Phosphatase: 69 U/L (ref 39–117)
BUN: 24 mg/dL — ABNORMAL HIGH (ref 6–23)
CO2: 29 meq/L (ref 19–32)
Calcium: 10.3 mg/dL (ref 8.4–10.5)
Chloride: 102 meq/L (ref 96–112)
Creatinine, Ser: 0.69 mg/dL (ref 0.40–1.20)
GFR: 84.68 mL/min (ref >60.00)
Glucose, Bld: 89 mg/dL (ref 70–99)
Potassium: 4 meq/L (ref 3.5–5.1)
Sodium: 138 meq/L (ref 135–145)
Total Bilirubin: 0.3 mg/dL (ref 0.2–1.2)
Total Protein: 8 g/dL (ref 6.0–8.3)

## 2024-03-02 NOTE — Telephone Encounter (Signed)
 Spoke with [name]. She states she thought she was scheduled for an OV with PCP, not a NV. I apologized for the mix up and was able to get her scheduled with PCP on 03/23/24.

## 2024-03-02 NOTE — Telephone Encounter (Signed)
 Pt scheduled for NV at [facility] for Flu vaccine. 9/8 LVM for pt to cb and confim of she wants her flu shot at [facility] or [facility].

## 2024-03-03 LAB — PROTIME-INR
INR: 1
Prothrombin Time: 10.2 s (ref 9.0–11.5)

## 2024-03-04 ENCOUNTER — Ambulatory Visit: Payer: Self-pay | Admitting: Internal Medicine

## 2024-03-04 DIAGNOSIS — Z8719 Personal history of other diseases of the digestive system: Secondary | ICD-10-CM

## 2024-03-04 DIAGNOSIS — D62 Acute posthemorrhagic anemia: Secondary | ICD-10-CM

## 2024-03-09 NOTE — Progress Notes (Unsigned)
 [facility] [address] [place] [address] Phone: [phone]   Assessment and Plan:     1. Bilateral primary osteoarthritis of knee (Primary) 2. Chronic pain of both knees -Chronic with exacerbation, subsequent visit - Overall improvement in flare of bilateral knee pain after bilateral HA injections on 01/28/2024.  Patient still experiencing daily stiffness and pain consistent with osteoarthritis, worse on left compared to right - Start meloxicam  15 mg daily x2 weeks.  If still having pain after 2 weeks, complete 3rd-week of NSAID. May use remaining NSAID as needed once daily for pain control.  Do not to use additional over-the-counter NSAIDs (ibuprofen , naproxen, Advil , Aleve, etc.) while taking prescription NSAIDs.  May use Tylenol  [phone] mg 2 to 3 times a day for breakthrough pain. - Plan to continue injection therapy with CSI every 3 months, and HA injections every 6 months. - Encouraged to continue weight loss journey and physical activity as tolerated  3. Gastroesophageal reflux disease, unspecified whether esophagitis present -Chronic with exacerbation, initial visit - Patient experiences GI discomfort and reflux symptoms with NSAID use.  May start pantoprazole  20 mg daily with meloxicam .  Recommend eating food prior to taking meloxicam  medication.    Pertinent previous records reviewed include none   Follow Up: 4 to 8 weeks for reevaluation.  Could consider repeat CSI versus GAE discussion   Subjective:   I, Angela Angela Ramirez, am serving as a Neurosurgeon for Doctor Angela Angela Ramirez   Chief Complaint: bilateral knee pain    HPI:    05/21/2022 Patient is a 78 year old female complaining of bilateral knee pain. patient states that she is having pain in her thighs when she walks , notes weakness in he knee when she stands, been going on for a month , was taking tramadol  , hx of gastric problem , is taking meloxicam  every  other day , is becoming more active , thinks this might be post sepsis pain , is losing weight    07/02/2022 Patient states that she is better ,not going back to PT    01/14/2023 Patient states right knee is swollen , she did a lot of house work this week, when she increases activity she has more pain flares. Enjoys water therapy    02/11/2023 Patient states that she is doing very well. Patient ended u getting the meloxicam  from her PCP when she went to see her and then she had a foot injection at her podiatrist and between those and the therapy she has been doing so well. She can walk, doing therapy and move around with ease since. Patient declines the gel injections today.   12/02/2023 Patient states she has been working out more. Ready for some shots    01/01/2024 Patient states she is so so    01/28/2024 Patient states  03/10/2024 Patient states right knee is better than left. Left knee she can feel soreness with ROM    Relevant Historical Information: Hep C with liver fibrosis, GERD, diverticulosis, hypertension  Additional pertinent review of systems negative.   Current Outpatient Medications:    pantoprazole  (PROTONIX ) 20 MG tablet, Take 1 tablet (20 mg total) by mouth daily., Disp: 30 tablet, Rfl: 1   cetirizine (ZYRTEC) 10 MG tablet, Take 10 mg by mouth daily as needed for allergies., Disp: , Rfl:    Fe Fum-FePoly-Vit C-Vit B3 (INTEGRA) 62.5-62.5-40-3 MG CAPS, Take 1 cap daily for 3 months, Disp: 30 capsule, Rfl: 2  FENUGREEK PO, Take by mouth., Disp: , Rfl:    hydrochlorothiazide  (HYDRODIURIL ) 25 MG tablet, TAKE 1 TABLET (25 MG TOTAL) BY MOUTH DAILY., Disp: 90 tablet, Rfl: 0   levalbuterol  (XOPENEX  HFA) 45 MCG/ACT inhaler, Inhale 1-2 puffs into the lungs every 6 (six) hours as needed for wheezing or shortness of breath., Disp: 15 g, Rfl: 0   MAGNESIUM  PO, Take by mouth., Disp: , Rfl:    meclizine  (ANTIVERT ) 12.5 MG tablet, Take 1 tablet (12.5 mg total) by mouth 3 (three) times  daily as needed for dizziness., Disp: 30 tablet, Rfl: 0   meloxicam  (MOBIC ) 15 MG tablet, TAKE 1 TABLET (15 MG TOTAL) BY MOUTH DAILY., Disp: 30 tablet, Rfl: 3   metFORMIN  (GLUCOPHAGE -XR) 500 MG 24 hr tablet, Take 1 tablet daily with largest meal., Disp: 30 tablet, Rfl: 0   POTASSIUM PO, Take by mouth., Disp: , Rfl:    tiZANidine  (ZANAFLEX ) 2 MG tablet, TAKE 1 TABLET(2 MG) BY MOUTH EVERY 8 HOURS AS NEEDED FOR MUSCLE SPASMS, Disp: 270 tablet, Rfl: 3   traMADol  (ULTRAM ) 50 MG tablet, Take 1 tablet (50 mg total) by mouth every 12 (twelve) hours as needed., Disp: 60 tablet, Rfl: 3   Objective:     Vitals:   03/10/24 0848  Pulse: 98  SpO2: 98%  Weight: 203 lb (92.1 kg)  Height: 5' 6 (1.676 m)      Body mass index is 32.77 kg/m.    Physical Exam:    general:  awake, alert oriented, no acute distress nontoxic Skin: no suspicious lesions or rashes Neuro:sensation intact and strength 5/5 with no deficits, no atrophy, normal muscle tone Psych: No signs of anxiety, depression or other mood disorder   Bilateral knee: Crepitus bilaterally No swelling No deformity Neg fluid wave, joint milking ROM Flex 100, Ext 5 TTP medial and lateral joint line, patella NTTP over the quad tendon, medial fem condyle, lat fem condyle,  , plica, patella tendon, tibial tuberostiy, fibular head, posterior fossa, pes anserine bursa, gerdy's tubercle,      Electronically signed by:  Angela Angela Ramirez [facility] 9:29 AM 03/10/24

## 2024-03-09 NOTE — Telephone Encounter (Signed)
 Pt has OV with PCP on 03/23/24. Flu vaccine can be received at that time.

## 2024-03-10 ENCOUNTER — Ambulatory Visit (INDEPENDENT_AMBULATORY_CARE_PROVIDER_SITE_OTHER): Admitting: Sports Medicine

## 2024-03-10 VITALS — HR 98 | Ht 66.0 in | Wt 203.0 lb

## 2024-03-10 DIAGNOSIS — K219 Gastro-esophageal reflux disease without esophagitis: Secondary | ICD-10-CM

## 2024-03-10 DIAGNOSIS — M170 Bilateral primary osteoarthritis of knee: Secondary | ICD-10-CM

## 2024-03-10 DIAGNOSIS — G8929 Other chronic pain: Secondary | ICD-10-CM

## 2024-03-10 DIAGNOSIS — M25561 Pain in right knee: Secondary | ICD-10-CM | POA: Diagnosis not present

## 2024-03-10 DIAGNOSIS — M25562 Pain in left knee: Secondary | ICD-10-CM | POA: Diagnosis not present

## 2024-03-10 MED ORDER — PANTOPRAZOLE SODIUM 20 MG PO TBEC: 20.0000 mg | DELAYED_RELEASE_TABLET | Freq: Every day | ORAL | 1 refills | Status: DC

## 2024-03-10 NOTE — Patient Instructions (Signed)
-   Start meloxicam  15 mg daily x2 weeks.  If still having pain after 2 weeks, complete 3rd-week of NSAID. May use remaining NSAID as needed once daily for pain control.  Do not to use additional over-the-counter NSAIDs (ibuprofen , naproxen, Advil , Aleve, etc.) while taking prescription NSAIDs.  May use Tylenol  [phone] mg 2 to 3 times a day for breakthrough pain.  Start pantoprazole  20 mg with meloxicam    Continue HEP   1-2 month follow up

## 2024-03-17 ENCOUNTER — Encounter (INDEPENDENT_AMBULATORY_CARE_PROVIDER_SITE_OTHER): Payer: Self-pay | Admitting: Family Medicine

## 2024-03-17 ENCOUNTER — Ambulatory Visit (INDEPENDENT_AMBULATORY_CARE_PROVIDER_SITE_OTHER): Admitting: Family Medicine

## 2024-03-17 VITALS — BP 144/78 | HR 83 | Temp 98.0°F | Ht 66.0 in | Wt 201.0 lb

## 2024-03-17 DIAGNOSIS — I150 Renovascular hypertension: Secondary | ICD-10-CM

## 2024-03-17 DIAGNOSIS — E66813 Obesity, class 3: Secondary | ICD-10-CM

## 2024-03-17 DIAGNOSIS — M170 Bilateral primary osteoarthritis of knee: Secondary | ICD-10-CM

## 2024-03-17 DIAGNOSIS — Z6841 Body Mass Index (BMI) 40.0 and over, adult: Secondary | ICD-10-CM

## 2024-03-17 DIAGNOSIS — E559 Vitamin D deficiency, unspecified: Secondary | ICD-10-CM | POA: Diagnosis not present

## 2024-03-17 DIAGNOSIS — R7303 Prediabetes: Secondary | ICD-10-CM | POA: Diagnosis not present

## 2024-03-17 MED ORDER — METFORMIN HCL ER 500 MG PO TB24
ORAL_TABLET | ORAL | 0 refills | Status: DC
Start: 2024-03-17 — End: 2024-03-29

## 2024-03-17 NOTE — Progress Notes (Signed)
 Angela Ramirez, D.O.  ABFM, ABOM Specializing in Clinical Bariatric Medicine  Office located at: [address] [place], [place]  [address]   Assessment and Plan:   Medications Discontinued During This Encounter  Medication Reason   metFORMIN  (GLUCOPHAGE -XR) 500 MG 24 hr tablet Reorder     Meds ordered this encounter  Medications   metFORMIN  (GLUCOPHAGE -XR) 500 MG 24 hr tablet    Sig: Take 1 tablet daily with largest meal.    Dispense:  30 tablet    Refill:  0     FOR THE DISEASE OF OBESITY:  Class 3 severe obesity with serious comorbidity and body mass index (BMI) of 40.0 to 44.9 in adult, current BMI 32.46 Morbid obesity (HCC) start 41.6 Assessment & Plan:  02/26/24 03/17/24   Body Fat % 44.8 % 44.6 %  Muscle Mass (lbs) 107 lbs 106 lbs  Fat Mass (lbs) 91.6 lbs 90 lbs  Total Body Water (lbs) 70.4 lbs 71 lbs  Visceral Fat Rating  14 14    Total lbs lost to date: - 5 lbs Total weight loss percentage to date: -2.43 %   Recommended Dietary Goals Angela Ramirez is currently in the action stage of change. As such, her goal is to continue weight management plan.  She has agreed to: continue current plan   Behavioral Intervention We discussed the following today: increasing lean protein intake to established goals, decreasing simple carbohydrates , work on tracking and journaling calories using tracking application, continue to work on implementation of reduced calorie nutritional plan, and focusing on food with a 10:1 ratio of calories: grams of protein  Additional resources provided today: none  Evidence-based interventions for health behavior change were utilized today including the discussion of self monitoring techniques, problem-solving barriers and SMART goal setting techniques.   Regarding patient's less desirable eating habits and patterns, we employed the technique of small changes.   Goal(s) for next OV: n/a    Recommended Physical Activity Goals Angela Ramirez  has been advised to work up to 300-450 minutes of moderate intensity aerobic activity a week and strengthening exercises 2-3 times per week for cardiovascular health, weight loss maintenance and preservation of muscle mass.   She was encouraged to Continue to gradually increase the amount and intensity of exercise routine   Pharmacotherapy See Pre-DM note.   ASSOCIATED CONDITIONS ADDRESSED TODAY:   Localized osteoarthritis of knees, bilateral Assessment & Plan: She has a history of moderate to severe osteoarthritis of knees bilaterally. She is followed by [facility], sports medicine. She does receive monovisc injections to knees bilaterally with last 01/28/24. She is also on Meloxicam . Continue treatment per Angela Ramirez. Encouraged to continue weight loss journey and physical activity as tolerated. Recommend to ice both knees after walking.     Renovascular hypertension Assessment & Plan: Last 3 blood pressure readings in our office are as follows: BP Readings from Last 3 Encounters:  03/17/24 (!) 144/78  02/26/24 136/78  02/12/24 (!) 181/75   Lab Results  Component Value Date   CREATININE 0.69 03/02/2024   BUN 24 (H) 03/02/2024   NA 138 03/02/2024   K 4.0 03/02/2024   CL 102 03/02/2024   CO2 29 03/02/2024   BP slightly above goal today; pt states she gets nervous before weighing on the scale. Pt asx. Her BP at home this morning was 125/77. Her BP is typically 120s-130s/70s-80s.Reviewed recent renal parameters which are within acceptable ranges. Cont hydrochlorothiazide  25 mg daily and low sodium diet.  Cont to f/up with PCP.    Prediabetes Assessment & Plan: Lab Results  Component Value Date   HGBA1C 5.9 (H) 02/12/2024   HGBA1C 6.0 01/27/2023   HGBA1C 6.0 11/10/2020   INSULIN  10.9 02/12/2024    LOV on 9/4, my colleague Angela Ramirez started Metformin  XR 500 mg daily for I.R, Pre-DM, and weight loss. Pt Denies loose stools. She has a history of chronic  constipation for which she takes OTC constipation meds; states the Metformin  has not affected her constipation. She endorses having some hunger and sweet cravings. We agreed she would work on increasing her protein intake and reducing simple and added sugars in her diet for hunger/craving suppression. Maintain with Metformin  at current dose (refill today). Increase exercise as able.    Vitamin D  deficiency Assessment & Plan: Lab Results  Component Value Date   VD25OH 108.0 (H) 02/12/2024   VD25OH 41.24 01/27/2023   VD25OH 35.90 10/16/2021   Her Vitamin D  was elevated at 108 on 8/21. At pt's last OV on 9/04, my colleague Angela Ramirez instructed pt to decrease her Vit D3K2 5000 units daily to every other day. Pt states she has actually not been taking any supplementation since that visit. We agreed that she will continue to hold off on supplementation; will recheck levels around 05/14/2024.    Follow up:   Return 03/29/2024 at 2:00 PM with Angela Ramirez.  She was informed of the importance of frequent follow up visits to maximize her success with intensive lifestyle modifications for her multiple health conditions.   Subjective:    Chief complaint: Obesity Angela Ramirez is here to discuss her progress with her obesity treatment plan. She is on the Category 2 Plan and keeping a food journal and adhering to recommended goals of 1200 calories and 90 grams protein daily and states she is following her eating plan approximately 99% of the time. Pt is walking 20-30 minutes 5-7 days per week   Interval History:  Angela Ramirez is here for a follow up office visit.   Pt has experienced a weight loss of 3 lbs since last OV on 02/26/2024 with Angela Ramirez   Pt's dietary and life style habits include:  - Tracking Calories/Macros: yes  - Eating More Whole Foods: yes  - Adequate Protein Intake: yes  - Adequate Water Intake: no  - Skipping Meals: no   - Sleeping 7-9  Hours/ Night: no    Pharmacotherapy that aid with weight loss: She is currently taking Metformin  XR 500 mg daily.   Review of Systems:  Pertinent positives were addressed with patient today.  Reviewed by clinician on day of visit: allergies, medications, problem list, medical history, surgical history, family history, social history, and previous encounter notes.  Weight Summary and Biometrics   Weight Lost Since Last Visit: 3lb  Weight Gained Since Last Visit: 0lb    Vitals Temp: 98 F (36.7 C) BP: (!) 144/78 Pulse Rate: 83 SpO2: 97 %   Anthropometric Measurements Height: 5' 6 (1.676 m) Weight: 201 lb (91.2 kg) BMI (Calculated): 32.46 Weight at Last Visit: 204lb Weight Lost Since Last Visit: 3lb Weight Gained Since Last Visit: 0lb Starting Weight: 206lb Total Weight Loss (lbs): 5 lb (2.268 kg) Peak Weight: 207lb   Body Composition  Body Fat %: 44.6 % Fat Mass (lbs): 90 lbs Muscle Mass (lbs): 106 lbs Total Body Water (lbs): 71 lbs Visceral Fat Rating : 14   Other Clinical Data  Fasting: no Labs: no Today's Visit #: 3 Starting Date: 02/12/24    Objective:   PHYSICAL EXAM: Blood pressure (!) 144/78, pulse 83, temperature 98 F (36.7 C), height 5' 6 (1.676 m), weight 201 lb (91.2 kg), SpO2 97%. Body mass index is 32.44 kg/m.  General: she is overweight, cooperative and in no acute distress. PSYCH: Has normal mood, affect and thought process.   HEENT: EOMI, sclerae are anicteric. Lungs: Normal breathing effort, no conversational dyspnea. Extremities: Moves * 4 Neurologic: A and O * 3, good insight  DIAGNOSTIC DATA REVIEWED: BMET    Component Value Date/Time   NA 138 03/02/2024 1027   NA 134 02/12/2024 0940   K 4.0 03/02/2024 1027   CL 102 03/02/2024 1027   CO2 29 03/02/2024 1027   GLUCOSE 89 03/02/2024 1027   BUN 24 (H) 03/02/2024 1027   BUN 13 02/12/2024 0940   CREATININE 0.69 03/02/2024 1027   CREATININE 0.80 04/09/2018 1011   CALCIUM  10.3 03/02/2024 1027   GFRNONAA >60 06/15/2023 0631   GFRNONAA 75 04/09/2018 1011   GFRAA 87 04/09/2018 1011   Lab Results  Component Value Date   HGBA1C 5.9 (H) 02/12/2024   HGBA1C 5.8 08/18/2017   Lab Results  Component Value Date   INSULIN  10.9 02/12/2024   Lab Results  Component Value Date   TSH 1.730 02/12/2024   CBC    Component Value Date/Time   WBC 7.2 03/02/2024 1027   RBC 4.48 03/02/2024 1027   HGB 13.2 03/02/2024 1027   HGB 13.6 02/12/2024 0940   HCT 39.6 03/02/2024 1027   HCT 42.4 02/12/2024 0940   PLT 276.0 03/02/2024 1027   PLT 263 02/12/2024 0940   MCV 88.4 03/02/2024 1027   MCV 90 02/12/2024 0940   MCH 28.9 02/12/2024 0940   MCH 28.5 06/15/2023 0631   MCHC 33.3 03/02/2024 1027   RDW 14.8 03/02/2024 1027   RDW 14.8 02/12/2024 0940   Iron Studies    Component Value Date/Time   IRON 30 (L) 03/02/2024 1027   TIBC 338.8 03/02/2024 1027   FERRITIN 26.1 03/02/2024 1027   IRONPCTSAT 8.9 (L) 03/02/2024 1027   Lipid Panel     Component Value Date/Time   CHOL 195 02/12/2024 0940   TRIG 81 02/12/2024 0940   HDL 72 02/12/2024 0940   CHOLHDL 2.7 02/12/2024 0940   CHOLHDL 3 01/27/2023 1016   VLDL 15.8 01/27/2023 1016   LDLCALC 108 (H) 02/12/2024 0940   Hepatic Function Panel     Component Value Date/Time   PROT 8.0 03/02/2024 1027   PROT 7.9 02/12/2024 0940   ALBUMIN 4.3 03/02/2024 1027   ALBUMIN 4.7 02/12/2024 0940   AST 20 03/02/2024 1027   ALT 13 03/02/2024 1027   ALT 25 09/17/2017 1435   ALKPHOS 69 03/02/2024 1027   BILITOT 0.3 03/02/2024 1027   BILITOT 0.5 02/12/2024 0940      Component Value Date/Time   TSH 1.730 02/12/2024 0940   Nutritional Lab Results  Component Value Date   VD25OH 108.0 (H) 02/12/2024   VD25OH 41.24 01/27/2023   VD25OH 35.90 10/16/2021    Attestations:   I, Special Puri, acting as a Stage manager for Angela Jenkins, DO., have compiled all relevant documentation for today's office visit on behalf of  Angela Jenkins, DO, while in the presence of [employer], DO.  I have reviewed the above documentation for accuracy and completeness, and I agree with the above. Angela Ramirez, D.O.  The  21st Century Cures Act was signed into law in 2016 which includes the topic of electronic health records.  This provides immediate access to information in MyChart. This includes consultation notes, operative notes, office notes, lab results and pathology reports.  If you have any questions about what you read please let us  know at your next visit so we can discuss your concerns and take corrective action if need be. We are right here with you.

## 2024-03-20 ENCOUNTER — Other Ambulatory Visit (INDEPENDENT_AMBULATORY_CARE_PROVIDER_SITE_OTHER): Payer: Self-pay | Admitting: Family Medicine

## 2024-03-20 DIAGNOSIS — R7303 Prediabetes: Secondary | ICD-10-CM

## 2024-03-23 ENCOUNTER — Ambulatory Visit (INDEPENDENT_AMBULATORY_CARE_PROVIDER_SITE_OTHER): Admitting: Internal Medicine

## 2024-03-23 ENCOUNTER — Encounter: Payer: Self-pay | Admitting: Internal Medicine

## 2024-03-23 VITALS — BP 140/80 | HR 83 | Temp 97.9°F | Ht 66.0 in | Wt 201.0 lb

## 2024-03-23 DIAGNOSIS — E66811 Obesity, class 1: Secondary | ICD-10-CM | POA: Diagnosis not present

## 2024-03-23 DIAGNOSIS — G8929 Other chronic pain: Secondary | ICD-10-CM | POA: Diagnosis not present

## 2024-03-23 DIAGNOSIS — M25561 Pain in right knee: Secondary | ICD-10-CM

## 2024-03-23 DIAGNOSIS — M25562 Pain in left knee: Secondary | ICD-10-CM | POA: Diagnosis not present

## 2024-03-23 DIAGNOSIS — Z23 Encounter for immunization: Secondary | ICD-10-CM

## 2024-03-23 DIAGNOSIS — E611 Iron deficiency: Secondary | ICD-10-CM

## 2024-03-23 DIAGNOSIS — Q0700 Arnold-Chiari syndrome without spina bifida or hydrocephalus: Secondary | ICD-10-CM

## 2024-03-23 NOTE — Patient Instructions (Signed)
 We have given the flu shot

## 2024-03-23 NOTE — Progress Notes (Signed)
" ° °  Subjective:   Patient ID: Angela Ramirez, female    DOB: October 05, 1945, 78 y.o.   MRN: [id number]  Discussed the use of AI scribe software for clinical note transcription with the patient, who gave verbal consent to proceed.  History of Present Illness Angela Ramirez is a 78 year old female who presents for follow-up on weight management and joint pain.  She is actively engaged in a holistic and supportive weight loss program, following a structured diet plan that emphasizes spreading protein intake throughout the day. This has resulted in a consistent weight loss of two to three pounds per visit. She is currently on metformin  for insulin  resistance.  She experiences hip pain, described as stiffness on both sides, which has improved with daily walking. The pain is not severe enough to require medication. She has arthritis in her knees and has received injections; she reports noticing some improvement in her symptoms. She takes two Tylenol  before walking to manage pain.  She experiences significant fatigue, which she associates with low iron levels. Her hemoglobin has improved from ten to thirteen, but she continues to take an iron supplement daily. She notices increased fatigue if she misses a dose.  She experiences vertigo when lying down, which has been consistent for a couple of weeks. She has meclizine  at home but uses it sparingly due to its sedative effects. She reports no associated headaches, numbness, or tingling.     Review of Systems  Objective:  Physical Exam  Vitals:   03/23/24 1019  BP: (!) 140/80  Pulse: 83  Temp: 97.9 F (36.6 C)  TempSrc: Oral  SpO2: 97%  Weight: 201 lb (91.2 kg)  Height: 5' 6 (1.676 m)   Flu shot given at visit  Assessment and Plan Assessment & Plan Insulin  resistance  / obesity Managed with metformin  without adverse effects. Weight loss through diet and exercise may reduce future metformin  need. Continue metformin  and encourage ongoing  weight loss and exercise.  Iron deficiency   Persistent fatigue may relate to iron deficiency despite normal hemoglobin, suggesting depleted iron stores. Fatigue increases when missing daily iron supplement. Continue daily iron supplementation.  Bilateral hip and knee pain   Hip pain improves with walking. Arthritis present in hips and knees, with knee injections providing relief. Takes Tylenol  before walking for pain. Continue walking as tolerated and take Tylenol  before walking for pain management.  Chiari malformation   Aware of symptoms, not experiencing headaches, numbness, or tingling. Vertigo not associated with typical Chiari symptoms. Monitor for any new or worsening symptoms related to Chiari malformation.   "

## 2024-03-26 DIAGNOSIS — E611 Iron deficiency: Secondary | ICD-10-CM | POA: Insufficient documentation

## 2024-03-26 NOTE — Assessment & Plan Note (Signed)
 Hip pain improves with walking. Arthritis present in hips and knees, with knee injections providing relief. Takes Tylenol  before walking for pain. Continue walking as tolerated and take Tylenol  before walking for pain management.

## 2024-03-26 NOTE — Assessment & Plan Note (Signed)
 Persistent fatigue may relate to iron deficiency despite normal hemoglobin, suggesting depleted iron stores. Fatigue increases when missing daily iron supplement. Continue daily iron supplementation.

## 2024-03-26 NOTE — Assessment & Plan Note (Signed)
 Aware of symptoms, not experiencing headaches, numbness, or tingling. Vertigo not associated with typical Chiari symptoms. Monitor for any new or worsening symptoms related to Chiari malformation.

## 2024-03-26 NOTE — Assessment & Plan Note (Signed)
 Managed with metformin  without adverse effects. Weight loss through diet and exercise may reduce future metformin  need. Continue metformin  and encourage ongoing weight loss and exercise.

## 2024-03-29 ENCOUNTER — Encounter (INDEPENDENT_AMBULATORY_CARE_PROVIDER_SITE_OTHER): Payer: Self-pay | Admitting: Nurse Practitioner

## 2024-03-29 ENCOUNTER — Ambulatory Visit (INDEPENDENT_AMBULATORY_CARE_PROVIDER_SITE_OTHER): Admitting: Nurse Practitioner

## 2024-03-29 VITALS — BP 144/78 | HR 94 | Temp 98.0°F | Ht 66.0 in | Wt 199.0 lb

## 2024-03-29 DIAGNOSIS — E66813 Obesity, class 3: Secondary | ICD-10-CM | POA: Diagnosis not present

## 2024-03-29 DIAGNOSIS — E559 Vitamin D deficiency, unspecified: Secondary | ICD-10-CM | POA: Diagnosis not present

## 2024-03-29 DIAGNOSIS — R7303 Prediabetes: Secondary | ICD-10-CM

## 2024-03-29 DIAGNOSIS — E88819 Insulin resistance, unspecified: Secondary | ICD-10-CM | POA: Diagnosis not present

## 2024-03-29 DIAGNOSIS — Z6841 Body Mass Index (BMI) 40.0 and over, adult: Secondary | ICD-10-CM | POA: Diagnosis not present

## 2024-03-29 DIAGNOSIS — I150 Renovascular hypertension: Secondary | ICD-10-CM

## 2024-03-29 MED ORDER — METFORMIN HCL ER 500 MG PO TB24: ORAL_TABLET | ORAL | 1 refills | Status: DC
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
## 2024-03-29 NOTE — Progress Notes (Signed)
 Office: [phone]  /  Fax: [phone]  WEIGHT SUMMARY AND BIOMETRICS  Weight Lost Since Last Visit: 2 lb  Weight Gained Since Last Visit: 0   Vitals Temp: 98 F (36.7 C) BP: (!) 144/78 Pulse Rate: 94 SpO2: 98 %   Anthropometric Measurements Height: 5' 6 (1.676 m) Weight: 199 lb (90.3 kg) BMI (Calculated): 32.13 Weight at Last Visit: 201 lb Weight Lost Since Last Visit: 2 lb Weight Gained Since Last Visit: 0 Starting Weight: 206 lb Total Weight Loss (lbs): 7 lb (3.175 kg) Peak Weight: 207 lb   Body Composition  Body Fat %: 45 % Fat Mass (lbs): 89.6 lbs Muscle Mass (lbs): 104 lbs Total Body Water (lbs): 72.4 lbs Visceral Fat Rating : 14   Other Clinical Data Fasting: no Labs: no Today's Visit #: 4 Starting Date: 02/12/24    Total Weight Loss: 7 pounds Percent of body weight lost: 3.3%   Bio Impedance Data reviewed with patient: Muscle is down 2 pounds, adipose is down 0.4 pounds  HPI  Chief Complaint: OBESITY  Angela Ramirez is here to discuss her progress with her obesity treatment plan. She is on the the Category 2 Plan and states she is following her eating plan approximately 98 % of the time. She states she is exercising 40 minutes 7 days per week.   Interval History:  Since last office visit she has lost 2 pounds. She has found she has been hungrier the last 2 weeks. She has started to increase protein and fiber in her diet. She has not been skipping any meals.  She has been snacking on apples, strawberries and blueberries. She has been searching for a pie recipe for Thanksgiving and has found a crust you can make with white beans. She is not skipping any meals. She is drinking 5-6 16 ounce bottle of water a day. She is getting 80-100 grams of protein daily.  She has decreased her Meloxicam  to every 3 days and tramadol  is only used rarely.She plans to add 30 minutes of strength training 3 days a week  Her knees are starting to feel much better,  hyaluronic injections were done bilaterally 01/28/24. Angela Ramirez's BP is currently well controlled with Amlodipine  10 mg 1 tab every day.  Denies headaches, chest pain , shortness of breath and dizziness. BP at home running 130-140/70's BP Readings from Last 3 Encounters:  03/29/24 (!) 144/78  03/23/24 (!) 140/80  03/17/24 (!) 144/78   She is currently on Metformin  XR 500 mg 1 tab PO every day for prediabetes/insulin  resistance. Denies side effects from medication  PHYSICAL EXAM:  Blood pressure (!) 144/78, pulse 94, temperature 98 F (36.7 C), height 5' 6 (1.676 m), weight 199 lb (90.3 kg), SpO2 98%. Body mass index is 32.12 kg/m.  General: Well Developed, well nourished, and in no acute distress.  HEENT: Normocephalic, atraumatic; EOMI, sclerae are anicteric. Skin: Warm and dry, good turgor Chest:  Normal excursion, shape, no gross ABN Respiratory: No conversational dyspnea; speaking in full sentences NeuroM-Sk:  Normal gross ROM * 4 extremities  Psych: A and O X 3, insight adequate, mood- full    DIAGNOSTIC DATA REVIEWED:  BMET    Component Value Date/Time   NA 138 03/02/2024 1027   NA 134 02/12/2024 0940   K 4.0 03/02/2024 1027   CL 102 03/02/2024 1027   CO2 29 03/02/2024 1027   GLUCOSE 89 03/02/2024 1027   BUN 24 (H) 03/02/2024 1027   BUN 13 02/12/2024 0940   CREATININE  0.69 03/02/2024 1027   CREATININE 0.80 04/09/2018 1011   CALCIUM 10.3 03/02/2024 1027   GFRNONAA >60 06/15/2023 0631   GFRNONAA 75 04/09/2018 1011   GFRAA 87 04/09/2018 1011   Lab Results  Component Value Date   HGBA1C 5.9 (H) 02/12/2024   HGBA1C 5.8 08/18/2017   Lab Results  Component Value Date   INSULIN  10.9 02/12/2024   Lab Results  Component Value Date   TSH 1.730 02/12/2024   CBC    Component Value Date/Time   WBC 7.2 03/02/2024 1027   RBC 4.48 03/02/2024 1027   HGB 13.2 03/02/2024 1027   HGB 13.6 02/12/2024 0940   HCT 39.6 03/02/2024 1027   HCT 42.4 02/12/2024 0940   PLT 276.0  03/02/2024 1027   PLT 263 02/12/2024 0940   MCV 88.4 03/02/2024 1027   MCV 90 02/12/2024 0940   MCH 28.9 02/12/2024 0940   MCH 28.5 06/15/2023 0631   MCHC 33.3 03/02/2024 1027   RDW 14.8 03/02/2024 1027   RDW 14.8 02/12/2024 0940   Iron Studies    Component Value Date/Time   IRON 30 (L) 03/02/2024 1027   TIBC 338.8 03/02/2024 1027   FERRITIN 26.1 03/02/2024 1027   IRONPCTSAT 8.9 (L) 03/02/2024 1027   Lipid Panel     Component Value Date/Time   CHOL 195 02/12/2024 0940   TRIG 81 02/12/2024 0940   HDL 72 02/12/2024 0940   CHOLHDL 2.7 02/12/2024 0940   CHOLHDL 3 01/27/2023 1016   VLDL 15.8 01/27/2023 1016   LDLCALC 108 (H) 02/12/2024 0940   Hepatic Function Panel     Component Value Date/Time   PROT 8.0 03/02/2024 1027   PROT 7.9 02/12/2024 0940   ALBUMIN 4.3 03/02/2024 1027   ALBUMIN 4.7 02/12/2024 0940   AST 20 03/02/2024 1027   ALT 13 03/02/2024 1027   ALT 25 09/17/2017 1435   ALKPHOS 69 03/02/2024 1027   BILITOT 0.3 03/02/2024 1027   BILITOT 0.5 02/12/2024 0940      Component Value Date/Time   TSH 1.730 02/12/2024 0940   Nutritional Lab Results  Component Value Date   VD25OH 108.0 (H) 02/12/2024   VD25OH 41.24 01/27/2023   VD25OH 35.90 10/16/2021     ASSESSMENT AND PLAN  Class 3 severe obesity with serious comorbidity and body mass index (BMI) of 40.0 to 44.9 in adult, current BMI 32.46 TREATMENT PLAN FOR OBESITY:  Recommended Dietary Goals  Angela Ramirez is currently in the action stage of change. As such, her goal is to continue weight management plan. She has agreed to the Category 2 Plan.  Behavioral Intervention  We discussed the following Behavioral Modification Strategies today: increasing fiber rich foods, keeping healthy foods at home, continue to work on maintaining a reduced calorie state, getting the recommended amount of protein, incorporating whole foods, making healthy choices, staying well hydrated and practicing mindfulness when eating.,  and increase protein intake, fibrous foods (25 grams per day for women, 30 grams for men) and water to improve satiety and decrease hunger signals. .    Recommended Physical Activity Goals  Angela Ramirez has been advised to work up to 150 minutes of moderate intensity aerobic activity a week and strengthening exercises 2-3 times per week for cardiovascular health, weight loss maintenance and preservation of muscle mass.   She has agreed to Think about enjoyable ways to increase daily physical activity and overcoming barriers to exercise, Increase physical activity in their day and reduce sedentary time (increase NEAT)., Increase volume of physical  activity to a goal of 240 minutes a week, and Combine aerobic and strengthening exercises for efficiency and improved cardiometabolic health.   Pharmacotherapy We discussed various medication options to help Angela Ramirez with her weight loss efforts and we both agreed to continue Metformin  XR 500 mg 1 tab PO every day for prediabetes/insulin  resistance. .  ASSOCIATED CONDITIONS ADDRESSED TODAY  Action/Plan  Renovascular hypertension Continue Amlodipine  10 mg every day Continue Category 2 meal plan  and DASH diet Monitor BP and if consistently >140/90 notify PCP If develops headaches, chest pain, shortness of breath or dizziness go to ER Loss of 10-15% body weight can help improve blood pressures    Prediabetes Insulin  resistance Continue Category 2  meal plan, limit simple carbohydrates Decreasing body weight by 10-15% can improve glucose levels Start increasing aerobic activity to 240 minutes/week and incorporate strength training 2-3 times a week Continue Metformin  XR 500 mg QD -     metFORMIN  HCl ER; Take 1 tablet daily with largest meal.  Dispense: 30 tablet; Refill: 1   Vitamin D  deficiency Will continue to hold Vit D supplement until rechecked in November as was elevated at last check          Return in about 2 weeks (around  04/12/2024).Angela Ramirez She was informed of the importance of frequent follow up visits to maximize her success with intensive lifestyle modifications for her multiple health conditions.   ATTESTASTION STATEMENTS:  Reviewed by clinician on day of visit: allergies, medications, problem list, medical history, surgical history, family history, social history, and previous encounter notes.   I personally spent a total of 48 minutes in the care of the patient today including preparing to see the patient, getting/reviewing separately obtained history, performing a medically appropriate exam/evaluation, counseling and educating, placing orders, referring and communicating with other health care professionals, documenting clinical information in the EHR, and coordinating care.   Newman Waren ANP-C

## 2024-04-01 ENCOUNTER — Other Ambulatory Visit: Payer: Self-pay | Admitting: Sports Medicine

## 2024-04-01 DIAGNOSIS — K219 Gastro-esophageal reflux disease without esophagitis: Secondary | ICD-10-CM

## 2024-04-11 ENCOUNTER — Other Ambulatory Visit: Payer: Self-pay | Admitting: Internal Medicine

## 2024-04-12 ENCOUNTER — Ambulatory Visit (INDEPENDENT_AMBULATORY_CARE_PROVIDER_SITE_OTHER): Admitting: Family Medicine

## 2024-04-12 ENCOUNTER — Ambulatory Visit (INDEPENDENT_AMBULATORY_CARE_PROVIDER_SITE_OTHER): Admitting: Ophthalmology

## 2024-04-12 ENCOUNTER — Encounter (INDEPENDENT_AMBULATORY_CARE_PROVIDER_SITE_OTHER): Payer: Self-pay | Admitting: Ophthalmology

## 2024-04-12 DIAGNOSIS — H4312 Vitreous hemorrhage, left eye: Secondary | ICD-10-CM | POA: Diagnosis not present

## 2024-04-12 DIAGNOSIS — H353221 Exudative age-related macular degeneration, left eye, with active choroidal neovascularization: Secondary | ICD-10-CM | POA: Diagnosis not present

## 2024-04-12 DIAGNOSIS — H532 Diplopia: Secondary | ICD-10-CM | POA: Diagnosis not present

## 2024-04-12 DIAGNOSIS — Z961 Presence of intraocular lens: Secondary | ICD-10-CM

## 2024-04-12 DIAGNOSIS — I10 Essential (primary) hypertension: Secondary | ICD-10-CM

## 2024-04-12 DIAGNOSIS — H442E3 Degenerative myopia with other maculopathy, bilateral eye: Secondary | ICD-10-CM | POA: Diagnosis not present

## 2024-04-12 DIAGNOSIS — H5213 Myopia, bilateral: Secondary | ICD-10-CM

## 2024-04-12 DIAGNOSIS — H35033 Hypertensive retinopathy, bilateral: Secondary | ICD-10-CM | POA: Diagnosis not present

## 2024-04-12 DIAGNOSIS — H43813 Vitreous degeneration, bilateral: Secondary | ICD-10-CM

## 2024-04-12 MED ORDER — BEVACIZUMAB CHEMO INJECTION 1.25MG/0.05ML SYRINGE FOR KALEIDOSCOPE
1.2500 mg | INTRAVITREAL | Status: AC | PRN
  Administered 2024-04-12: 1.25 mg via INTRAVITREAL

## 2024-04-14 ENCOUNTER — Encounter (INDEPENDENT_AMBULATORY_CARE_PROVIDER_SITE_OTHER): Payer: Self-pay | Admitting: Nurse Practitioner

## 2024-04-14 ENCOUNTER — Ambulatory Visit (INDEPENDENT_AMBULATORY_CARE_PROVIDER_SITE_OTHER): Admitting: Nurse Practitioner

## 2024-04-14 VITALS — BP 171/81 | HR 95 | Temp 98.1°F | Ht 66.0 in | Wt 195.0 lb

## 2024-04-14 DIAGNOSIS — E66811 Obesity, class 1: Secondary | ICD-10-CM

## 2024-04-14 DIAGNOSIS — E559 Vitamin D deficiency, unspecified: Secondary | ICD-10-CM | POA: Diagnosis not present

## 2024-04-14 DIAGNOSIS — I150 Renovascular hypertension: Secondary | ICD-10-CM | POA: Diagnosis not present

## 2024-04-14 DIAGNOSIS — R7303 Prediabetes: Secondary | ICD-10-CM

## 2024-04-14 DIAGNOSIS — Z6831 Body mass index (BMI) 31.0-31.9, adult: Secondary | ICD-10-CM | POA: Diagnosis not present

## 2024-04-14 DIAGNOSIS — E88819 Insulin resistance, unspecified: Secondary | ICD-10-CM | POA: Diagnosis not present

## 2024-04-14 NOTE — Addendum Note (Signed)
 Addended by: JUDE BRUNET E on: 04/14/2024 01:00 PM   Modules accepted: Level of Service

## 2024-04-14 NOTE — Progress Notes (Signed)
 Office: [phone]  /  Fax: [phone]  WEIGHT SUMMARY AND BIOMETRICS  Weight Lost Since Last Visit: 4 lb  Weight Gained Since Last Visit: 0   Vitals Temp: 98.1 F (36.7 C) BP: (!) 171/81 Pulse Rate: 95 SpO2: 96 %   Anthropometric Measurements Height: 5' 6 (1.676 m) Weight: 195 lb (88.5 kg) BMI (Calculated): 31.49 Weight at Last Visit: 199 lb Weight Lost Since Last Visit: 4 lb Weight Gained Since Last Visit: 0 Starting Weight: 206 lb Total Weight Loss (lbs): 11 lb (4.99 kg) Peak Weight: 207 lb   Body Composition  Body Fat %: 43.2 % Fat Mass (lbs): 84.6 lbs Muscle Mass (lbs): 105.6 lbs Total Body Water (lbs): 70.2 lbs Visceral Fat Rating : 13   Other Clinical Data Fasting: No Labs: No Today's Visit #: 5 Starting Date: 02/12/24    Total Weight Loss: 11 pounds  Percent of body weight lost:5.3% Bio Impedance Data reviewed with patient: Muscle is up 1.6 pounds and adipose is down 5 pounds . PBF has decreased from 45% to 43.2% and visceral fat rating has decreased from 14 to 13  HPI  Chief Complaint: OBESITY  Angela Ramirez is here to discuss her progress with her obesity treatment plan. She is on the the Category 1 Plan and states she is following her eating plan approximately 90-100 % of the time. She states she is exercising  40 minutes 7 days per week- walking with a weight vest.   Interval History:  Since last office visit she has been getting at least 90 grams of protein and getting  80-96 ounces of water.  She has been walking with a weight vest 2 times a week and then walks without the other days. She is doing more NEAT time. She is taking Vit B complex, B12 dissolvable and Vit C. She is not skipping meals. She has not been eating out.  She is taking Metformin  XR 500 mg 1 cap daily for insulin  resistance and denies side effects. She continues to hold Vit D supplementation as it was elevated at last check- will recheck in November.   She does have  hypertension currently treated with hydrochlorothiazide  25 mg every day. She took BP this morning at home and was 151/74. She just took her BP med right before coming. No headaches, chest pain, or shortness of breath at rest.  BP Readings from Last 3 Encounters:  04/14/24 (!) 171/81  03/29/24 (!) 144/78  03/23/24 (!) 140/80      PHYSICAL EXAM:  Blood pressure (!) 171/81, pulse 95, temperature 98.1 F (36.7 C), height 5' 6 (1.676 m), weight 195 lb (88.5 kg), SpO2 96%. Body mass index is 31.47 kg/m.  General: Well Developed, well nourished, and in no acute distress.  HEENT: Normocephalic, atraumatic; EOMI, sclerae are anicteric. Skin: Warm and dry, good turgor Chest:  Normal excursion, shape, no gross ABN Respiratory: No conversational dyspnea; speaking in full sentences NeuroM-Sk:  Normal gross ROM * 4 extremities  Psych: A and O X 3, insight adequate, mood- full    DIAGNOSTIC DATA REVIEWED:  BMET    Component Value Date/Time   NA 138 03/02/2024 1027   NA 134 02/12/2024 0940   K 4.0 03/02/2024 1027   CL 102 03/02/2024 1027   CO2 29 03/02/2024 1027   GLUCOSE 89 03/02/2024 1027   BUN 24 (H) 03/02/2024 1027   BUN 13 02/12/2024 0940   CREATININE 0.69 03/02/2024 1027   CREATININE 0.80 04/09/2018 1011   CALCIUM 10.3 03/02/2024  1027   GFRNONAA >60 06/15/2023 0631   GFRNONAA 75 04/09/2018 1011   GFRAA 87 04/09/2018 1011   Lab Results  Component Value Date   HGBA1C 5.9 (H) 02/12/2024   HGBA1C 5.8 08/18/2017   Lab Results  Component Value Date   INSULIN  10.9 02/12/2024   Lab Results  Component Value Date   TSH 1.730 02/12/2024   CBC    Component Value Date/Time   WBC 7.2 03/02/2024 1027   RBC 4.48 03/02/2024 1027   HGB 13.2 03/02/2024 1027   HGB 13.6 02/12/2024 0940   HCT 39.6 03/02/2024 1027   HCT 42.4 02/12/2024 0940   PLT 276.0 03/02/2024 1027   PLT 263 02/12/2024 0940   MCV 88.4 03/02/2024 1027   MCV 90 02/12/2024 0940   MCH 28.9 02/12/2024 0940   MCH  28.5 06/15/2023 0631   MCHC 33.3 03/02/2024 1027   RDW 14.8 03/02/2024 1027   RDW 14.8 02/12/2024 0940   Iron Studies    Component Value Date/Time   IRON 30 (L) 03/02/2024 1027   TIBC 338.8 03/02/2024 1027   FERRITIN 26.1 03/02/2024 1027   IRONPCTSAT 8.9 (L) 03/02/2024 1027   Lipid Panel     Component Value Date/Time   CHOL 195 02/12/2024 0940   TRIG 81 02/12/2024 0940   HDL 72 02/12/2024 0940   CHOLHDL 2.7 02/12/2024 0940   CHOLHDL 3 01/27/2023 1016   VLDL 15.8 01/27/2023 1016   LDLCALC 108 (H) 02/12/2024 0940    Nutritional Lab Results  Component Value Date   VD25OH 108.0 (H) 02/12/2024   VD25OH 41.24 01/27/2023   VD25OH 35.90 10/16/2021   Lab Results  Component Value Date   VITAMINB12 831 02/12/2024     ASSESSMENT AND PLAN  Class 1 obesity with serious comorbidity and body mass index (BMI) of 31.0 to 31.9 in adult, unspecified obesity type TREATMENT PLAN FOR OBESITY:  Recommended Dietary Goals  Bayley is currently in the action stage of change. As such, her goal is to continue weight management plan. She has agreed to the Category 1 Plan.  Behavioral Intervention  We discussed the following Behavioral Modification Strategies today: celebration eating strategies, continue to work on maintaining a reduced calorie state, getting the recommended amount of protein, incorporating whole foods, making healthy choices, staying well hydrated and practicing mindfulness when eating., and increase protein intake, fibrous foods (25 grams per day for women, 30 grams for men) and water to improve satiety and decrease hunger signals. .    Recommended Physical Activity Goals  Roan has been advised to work up to 150 minutes of moderate intensity aerobic activity a week and strengthening exercises 2-3 times per week for cardiovascular health, weight loss maintenance and preservation of muscle mass.   She has agreed to Think about enjoyable ways to increase daily physical  activity and overcoming barriers to exercise, Increase physical activity in their day and reduce sedentary time (increase NEAT)., Increase volume of physical activity to a goal of 240 minutes a week, and Combine aerobic and strengthening exercises for efficiency and improved cardiometabolic health.   Pharmacotherapy We discussed various medication options to help Lavana with her weight loss efforts and we both agreed to continue Metformin  XR 500 mg 1 tab daily for insulin  resistance, denies side effects.  ASSOCIATED CONDITIONS ADDRESSED TODAY  Action/Plan  Renovascular hypertension Continue hydrochlorothiazide  25 mg every day Continue Category 1 meal plan  and DASH diet Monitor BP and if consistently >140/90 notify PCP If develops headaches, chest  pain, shortness of breath or dizziness go to ER Loss of 10-15% body weight can help improve blood pressures   Prediabetes Insulin  Resistance Start Category 1  meal plan, limit simple carbohydrates Continue Metformin  XR 500 mg every day  Continue exercise with current goal of 240 minutes of moderate to high intensity exercise/week and strength training 2-3 days a week.   Vitamin D  deficiency       Continue to hold supplement and will recheck level next month        Return in about 2 weeks (around 04/28/2024).SABRA She was informed of the importance of frequent follow up visits to maximize her success with intensive lifestyle modifications for her multiple health conditions.   ATTESTASTION STATEMENTS:  Reviewed by clinician on day of visit: allergies, medications, problem list, medical history, surgical history, family history, social history, and previous encounter notes.     Marigold Mom ANP-C

## 2024-04-28 ENCOUNTER — Ambulatory Visit (INDEPENDENT_AMBULATORY_CARE_PROVIDER_SITE_OTHER): Payer: Self-pay | Admitting: Nurse Practitioner

## 2024-05-04 ENCOUNTER — Ambulatory Visit (INDEPENDENT_AMBULATORY_CARE_PROVIDER_SITE_OTHER): Admitting: Family Medicine

## 2024-05-04 ENCOUNTER — Encounter (INDEPENDENT_AMBULATORY_CARE_PROVIDER_SITE_OTHER): Payer: Self-pay | Admitting: Family Medicine

## 2024-05-04 ENCOUNTER — Other Ambulatory Visit: Payer: Self-pay | Admitting: Internal Medicine

## 2024-05-04 VITALS — BP 143/78 | HR 81 | Ht 66.0 in | Wt 192.0 lb

## 2024-05-04 DIAGNOSIS — E66813 Obesity, class 3: Secondary | ICD-10-CM | POA: Diagnosis not present

## 2024-05-04 DIAGNOSIS — Z6831 Body mass index (BMI) 31.0-31.9, adult: Secondary | ICD-10-CM

## 2024-05-04 DIAGNOSIS — R7303 Prediabetes: Secondary | ICD-10-CM | POA: Diagnosis not present

## 2024-05-04 DIAGNOSIS — I150 Renovascular hypertension: Secondary | ICD-10-CM | POA: Diagnosis not present

## 2024-05-04 DIAGNOSIS — E559 Vitamin D deficiency, unspecified: Secondary | ICD-10-CM | POA: Diagnosis not present

## 2024-05-04 NOTE — Progress Notes (Signed)
 Angela Ramirez, D.O.  ABFM, ABOM Specializing in Clinical Bariatric Medicine  Office located at: [address] [place], [place]  [address]      A) FOR THE CHRONIC DISEASE OF OBESITY:  Chief complaint: Obesity Angela Ramirez is here to discuss her progress with her obesity treatment plan.   History of present illness / Interval history:  Angela Ramirez is here today for her follow-up office visit.  Since last OV on 04/14/24, pt is 3 lbs.   - has been trying to stay in her protein and calories even though she has been busy with moving into a new place  - trying he hardest    04/14/24 05/04/24 08:00   Body Fat % 43.2 % 42.8 %  Muscle Mass (lbs) 105.6 lbs 104.4 lbs  Fat Mass (lbs) 84.6 lbs 82.4 lbs  Total Body Water (lbs) 70.2 lbs 67.4 lbs  Visceral Fat Rating  13 13   Counseling done on how various foods will affect these numbers and how to maximize success   Total lbs lost to date: - 14 lbs Total Fat Mass in lbs lost to date: - 11.8 lbs Total weight loss percentage to date: - 6.80 %    Class 3 severe obesity, current BMI 31 Morbid obesity (HCC) start 41.6  Nutrition Therapy She is on the Category 1 Plan and states she is following her eating plan approximately 80-90 % of the time.   - Tracking Calories/Macros: yes  - Eating More Whole Foods: yes  - Adequate Protein Intake: yes  - Adequate Water Intake: yes  - Skipping Meals: no   - Sleeping 7-9 Hours/ Night: no    Angela Ramirez is currently in the action stage of change. As such, her goal is to continue weight management plan.  She has agreed to: {EMWTLOSSPLAN:29297::continue current plan}   Physical Activity [name] is moving into a new home so she has been very active.    Angela Ramirez has been advised to work up to 300-450 minutes of moderate intensity aerobic activity a week and strengthening exercises 2-3 times per week for cardiovascular health, weight loss maintenance and preservation of muscle mass.  She  has agreed to : Combine aerobic and strengthening exercises for efficiency and improved cardiometabolic health.   Behavioral Modifications Evidence-based interventions for health behavior change were utilized today including the discussion of  1) self monitoring techniques:    - Continue measuring lean protein   2) problem-solving barriers:  ***  3) self care:  ***  4) SMART goals for next OV:  *** Regarding patient's less desirable eating habits and patterns, we employed the technique of small changes.   We discussed the following today: continue to work on maintaining a reduced calorie state, getting the recommended amount of protein, incorporating whole foods, making healthy choices, staying well hydrated and practicing mindfulness when eating. Additional resources provided today: {DOhandouts:31655}   Medical Interventions/ Pharmacotherapy Previous Bariatric surgery: *** Pharmacotherapy for weight loss: She is currently taking Metformin  XR 1 tablet daily for medical weight loss.    We discussed various medication options to help Avin with her weight loss efforts and we both agreed to : {EMagreedrx:29170}   B) OBESITY RELATED CONDITIONS ADDRESSED TODAY:  Prediabetes Assessment & Plan Lab Results  Component Value Date   HGBA1C 5.9 (H) 02/12/2024   HGBA1C 6.0 01/27/2023   HGBA1C 6.0 11/10/2020   INSULIN  10.9 02/12/2024   CMP     Component Value Date/Time   NA 138  03/02/2024 1027   NA 134 02/12/2024 0940   K 4.0 03/02/2024 1027   CL 102 03/02/2024 1027   CO2 29 03/02/2024 1027   GLUCOSE 89 03/02/2024 1027   BUN 24 (H) 03/02/2024 1027   BUN 13 02/12/2024 0940   CREATININE 0.69 03/02/2024 1027   CREATININE 0.80 04/09/2018 1011   CALCIUM 10.3 03/02/2024 1027   PROT 8.0 03/02/2024 1027   PROT 7.9 02/12/2024 0940   ALBUMIN 4.3 03/02/2024 1027   ALBUMIN 4.7 02/12/2024 0940   AST 20 03/02/2024 1027   ALT 13 03/02/2024 1027   ALT 25 09/17/2017 1435   ALKPHOS 69  03/02/2024 1027   BILITOT 0.3 03/02/2024 1027   BILITOT 0.5 02/12/2024 0940   GFR 84.68 03/02/2024 1027   EGFR 82 02/12/2024 0940   GFRNONAA >60 06/15/2023 0631   GFRNONAA 75 04/09/2018 1011    On Metformin  1 tablet daily. Good compliance and tolerance.  - has not taken it for about 5 days was unsure she had a refill  - did not notice she was more hungry and wasn't craving more  - since it's not around her house she doesn't think abo Explained connection between insulin  and simple carbs and increased levels of hunger  Explained to pt that if she feels like her hungre and cravings are well controlled then continue to be off metformin  but if she feels like she needs them then begin once again  - continue following prudent mea l Kidney panel was WNL no acute converns    Renovascular hypertension Assessment & Plan Last 3 blood pressure readings in our office are as follows: BP Readings from Last 3 Encounters:  05/04/24 (!) 143/78  04/14/24 (!) 171/81  03/29/24 (!) 144/78   The ASCVD Risk score (Arnett DK, et al., 2019) failed to calculate for the following reasons:   Risk score cannot be calculated because patient has a medical history suggesting prior/existing ASCVD  Lab Results  Component Value Date   CREATININE 0.69 03/02/2024   On Hydrochlorothiazide  25 mg every day.  - mych better this time then last time  Out of medication and has not gotten her medication refilled  Took one this morning from her sister  Patient states that she is also taking amlodipine   Recommended pt to send a ychart messsage about being out of her medications    Vitamin D  deficiency Assessment & Plan Lab Results  Component Value Date   VD25OH 108.0 (H) 02/12/2024   VD25OH 41.24 01/27/2023   VD25OH 35.90 10/16/2021   Patients preious levels were above goal. Discontinued the use of Vit D supplements. Will recheck levels in 2-3 months.       There are no discontinued medications.   No orders  of the defined types were placed in this encounter.     Follow up:   Return 05/18/2024 at 10:30 AM  She was informed of the importance of frequent follow up visits to maximize her success with intensive lifestyle modifications for her multiple health conditions.   Weight Summary and Biometrics   Weight Lost Since Last Visit: 3lb  Weight Gained Since Last Visit: 0lb    Vitals BP: (!) 143/78 Pulse Rate: 81 SpO2: 97 %   Anthropometric Measurements Height: 5' 6 (1.676 m) Weight: 192 lb (87.1 kg) BMI (Calculated): 31 Weight at Last Visit: 195lb Weight Lost Since Last Visit: 3lb Weight Gained Since Last Visit: 0lb Starting Weight: 206lb Total Weight Loss (lbs): 14 lb (6.35 kg) Peak Weight: 207lb  Body Composition  Body Fat %: 42.8 % Fat Mass (lbs): 82.4 lbs Muscle Mass (lbs): 104.4 lbs Total Body Water (lbs): 67.4 lbs Visceral Fat Rating : 13   Other Clinical Data Fasting: yes Labs: no Today's Visit #: 6 Starting Date: 02/12/24    Objective:   PHYSICAL EXAM: Blood pressure (!) 143/78, pulse 81, height 5' 6 (1.676 m), weight 192 lb (87.1 kg), SpO2 97%. Body mass index is 30.99 kg/m.  General: she is overweight, cooperative and in no acute distress. PSYCH: Has normal mood, affect and thought process.   HEENT: EOMI, sclerae are anicteric. Lungs: Normal breathing effort, no conversational dyspnea. Extremities: Moves * 4 Neurologic: A and O * 3, good insight  DIAGNOSTIC DATA REVIEWED: BMET    Component Value Date/Time   NA 138 03/02/2024 1027   NA 134 02/12/2024 0940   K 4.0 03/02/2024 1027   CL 102 03/02/2024 1027   CO2 29 03/02/2024 1027   GLUCOSE 89 03/02/2024 1027   BUN 24 (H) 03/02/2024 1027   BUN 13 02/12/2024 0940   CREATININE 0.69 03/02/2024 1027   CREATININE 0.80 04/09/2018 1011   CALCIUM 10.3 03/02/2024 1027   GFRNONAA >60 06/15/2023 0631   GFRNONAA 75 04/09/2018 1011   GFRAA 87 04/09/2018 1011   Lab Results  Component Value Date    HGBA1C 5.9 (H) 02/12/2024   HGBA1C 5.8 08/18/2017   Lab Results  Component Value Date   INSULIN  10.9 02/12/2024   Lab Results  Component Value Date   TSH 1.730 02/12/2024   CBC    Component Value Date/Time   WBC 7.2 03/02/2024 1027   RBC 4.48 03/02/2024 1027   HGB 13.2 03/02/2024 1027   HGB 13.6 02/12/2024 0940   HCT 39.6 03/02/2024 1027   HCT 42.4 02/12/2024 0940   PLT 276.0 03/02/2024 1027   PLT 263 02/12/2024 0940   MCV 88.4 03/02/2024 1027   MCV 90 02/12/2024 0940   MCH 28.9 02/12/2024 0940   MCH 28.5 06/15/2023 0631   MCHC 33.3 03/02/2024 1027   RDW 14.8 03/02/2024 1027   RDW 14.8 02/12/2024 0940   Iron Studies    Component Value Date/Time   IRON 30 (L) 03/02/2024 1027   TIBC 338.8 03/02/2024 1027   FERRITIN 26.1 03/02/2024 1027   IRONPCTSAT 8.9 (L) 03/02/2024 1027   Lipid Panel     Component Value Date/Time   CHOL 195 02/12/2024 0940   TRIG 81 02/12/2024 0940   HDL 72 02/12/2024 0940   CHOLHDL 2.7 02/12/2024 0940   CHOLHDL 3 01/27/2023 1016   VLDL 15.8 01/27/2023 1016   LDLCALC 108 (H) 02/12/2024 0940   Hepatic Function Panel     Component Value Date/Time   PROT 8.0 03/02/2024 1027   PROT 7.9 02/12/2024 0940   ALBUMIN 4.3 03/02/2024 1027   ALBUMIN 4.7 02/12/2024 0940   AST 20 03/02/2024 1027   ALT 13 03/02/2024 1027   ALT 25 09/17/2017 1435   ALKPHOS 69 03/02/2024 1027   BILITOT 0.3 03/02/2024 1027   BILITOT 0.5 02/12/2024 0940      Component Value Date/Time   TSH 1.730 02/12/2024 0940   Nutritional Lab Results  Component Value Date   VD25OH 108.0 (H) 02/12/2024   VD25OH 41.24 01/27/2023   VD25OH 35.90 10/16/2021    Attestations:   I, ***, acting as a medical scribe for Angela Jenkins, DO., have compiled all relevant documentation for today's office visit on behalf of Angela Jenkins, DO, while in the presence  of [employer], DO.  I have spent *** minutes in the care of the patient today including *** minutes face-to-face  assessing and reviewing listed medical problems above as outlined in office visit note and providing nutritional and behavioral counseling as outlined in obesity care plan.   I have reviewed the above documentation for accuracy and completeness, and I agree with the above. Angela JINNY Ramirez, D.O.  The 21st Century Cures Act was signed into law in 2016 which includes the topic of electronic health records.  This provides immediate access to information in MyChart.  This includes consultation notes, operative notes, office notes, lab results and pathology reports.  If you have any questions about what you read please let us  know at your next visit so we can discuss your concerns and take corrective action if need be.  We are right here with you.

## 2024-05-12 ENCOUNTER — Ambulatory Visit (INDEPENDENT_AMBULATORY_CARE_PROVIDER_SITE_OTHER): Payer: Self-pay | Admitting: Nurse Practitioner

## 2024-05-17 ENCOUNTER — Encounter: Payer: Self-pay | Admitting: Internal Medicine

## 2024-05-17 ENCOUNTER — Ambulatory Visit (INDEPENDENT_AMBULATORY_CARE_PROVIDER_SITE_OTHER): Admitting: Internal Medicine

## 2024-05-17 VITALS — BP 150/80 | HR 74 | Temp 97.9°F | Ht 66.0 in | Wt 196.8 lb

## 2024-05-17 DIAGNOSIS — Z6831 Body mass index (BMI) 31.0-31.9, adult: Secondary | ICD-10-CM

## 2024-05-17 DIAGNOSIS — G4700 Insomnia, unspecified: Secondary | ICD-10-CM | POA: Diagnosis not present

## 2024-05-17 DIAGNOSIS — E66811 Obesity, class 1: Secondary | ICD-10-CM

## 2024-05-17 DIAGNOSIS — I10 Essential (primary) hypertension: Secondary | ICD-10-CM | POA: Diagnosis not present

## 2024-05-17 DIAGNOSIS — M791 Myalgia, unspecified site: Secondary | ICD-10-CM | POA: Diagnosis not present

## 2024-05-17 MED ORDER — TEMAZEPAM 7.5 MG PO CAPS: 7.5000 mg | ORAL_CAPSULE | Freq: Every evening | ORAL | 0 refills | Status: AC | PRN

## 2024-05-17 MED ORDER — AMLODIPINE BESYLATE 5 MG PO TABS
5.0000 mg | ORAL_TABLET | Freq: Every day | ORAL | 1 refills | Status: AC
Start: 2024-05-17 — End: ?

## 2024-05-17 NOTE — Progress Notes (Signed)
 Subjective:   Patient ID: Angela Ramirez, female    DOB: [birth year], 78 y.o.   MRN: [id number]  Discussed the use of AI scribe software for clinical note transcription with the patient, who gave verbal consent to proceed.  History of Present Illness Angela Ramirez is a 78 year old female who presents for follow-up after a fall and to discuss medication management.  She experienced a fall last week after slipping on a spool of thread while carrying a plastic bag. The fall resulted in mild soreness in the area where the bag hit her, which has improved. Initially, coughing was painful but has improved with self-care measures.  She is concerned about her blood pressure management and requests to resume amlodipine  temporarily. She also requests to restart Restoril  (temazepam ) for her sleep issues, as she has been experiencing difficulty sleeping, waking up at 1 AM and staying awake.   She discusses her weight management journey, noting a recent weight of 193 pounds, which she attributes to being less active and not following her diet strictly due to being at home. She attends bi-weekly weigh-ins, which she finds helpful for maintaining consistency. She reports losing 3-4 pounds every two weeks and is pleased with her progress, noting improvements in clothing fit and body fat reduction. She remains active, painting and moving items in her house, and plans to increase her physical activity with walking and weightlifting.  She mentions experiencing muscle cramping, which has improved significantly. She attributes this improvement to her active lifestyle and dietary changes, focusing on fueling her body properly.   Review of Systems  Constitutional: Negative.   HENT: Negative.    Eyes: Negative.   Respiratory:  Negative for cough, chest tightness and shortness of breath.   Cardiovascular:  Negative for chest pain, palpitations and leg swelling.  Gastrointestinal:  Negative for abdominal  distention, abdominal pain, constipation, diarrhea, nausea and vomiting.  Musculoskeletal:  Positive for myalgias.  Skin: Negative.   Neurological: Negative.   Psychiatric/Behavioral: Negative.      Objective:  Physical Exam Constitutional:      Appearance: She is well-developed.  HENT:     Head: Normocephalic and atraumatic.  Cardiovascular:     Rate and Rhythm: Normal rate and regular rhythm.  Pulmonary:     Effort: Pulmonary effort is normal. No respiratory distress.     Breath sounds: Normal breath sounds. No wheezing or rales.  Abdominal:     General: Bowel sounds are normal. There is no distension.     Palpations: Abdomen is soft.     Tenderness: There is no abdominal tenderness.  Musculoskeletal:        General: Tenderness present.     Cervical back: Normal range of motion.     Comments: Left lower ribcage sore without fracture detected  Skin:    General: Skin is warm and dry.  Neurological:     Mental Status: She is alert and oriented to person, place, and time.     Coordination: Coordination normal.     Vitals:   05/17/24 0810 05/17/24 0824  BP: (!) 150/80 (!) 150/80  Pulse: 74   Temp: 97.9 F (36.6 C)   TempSrc: Oral   SpO2: 99%   Weight: 196 lb 12.8 oz (89.3 kg)   Height: 5' 6 (1.676 m)     Assessment and Plan Assessment & Plan Insomnia   She experiences difficulty sleeping, waking at 1 AM, with significant fatigue exacerbated by moving. Prescribe temazepam  7.5 mg. If  ineffective after 1-2 days, increase to 15 mg by taking two 7.5 mg tablets. Plan to use temazepam  for approximately two months while settling into the new house. PDMP reviewed and appropriate.  Hypertension   She requested resumption of amlodipine  for blood pressure management during the transition period. Prescribe amlodipine  as her BP is elevated today and continue current regimen as well.  Obesity and insulin  resistance   She is engaged in a weight management program, having lost 3  pounds with increased physical activity and dietary changes. Continue the current weight management program with bi-weekly weigh-ins.  Musculoskeletal pain following recent fall   She has soreness around the impact area, likely involving muscle and cartilage, with no fracture. Continue to monitor symptoms and manage conservatively.

## 2024-05-17 NOTE — Patient Instructions (Addendum)
 We have sent in the temazepam  to use at night time.  We have sent in the amlodipine .

## 2024-05-18 ENCOUNTER — Ambulatory Visit (INDEPENDENT_AMBULATORY_CARE_PROVIDER_SITE_OTHER): Admitting: Nurse Practitioner

## 2024-05-18 ENCOUNTER — Encounter (INDEPENDENT_AMBULATORY_CARE_PROVIDER_SITE_OTHER): Payer: Self-pay | Admitting: Nurse Practitioner

## 2024-05-18 VITALS — BP 154/77 | HR 74 | Temp 98.3°F | Ht 66.0 in | Wt 193.0 lb

## 2024-05-18 DIAGNOSIS — R7303 Prediabetes: Secondary | ICD-10-CM | POA: Diagnosis not present

## 2024-05-18 DIAGNOSIS — E88819 Insulin resistance, unspecified: Secondary | ICD-10-CM

## 2024-05-18 DIAGNOSIS — I10 Essential (primary) hypertension: Secondary | ICD-10-CM

## 2024-05-18 DIAGNOSIS — E559 Vitamin D deficiency, unspecified: Secondary | ICD-10-CM | POA: Diagnosis not present

## 2024-05-18 DIAGNOSIS — E66811 Obesity, class 1: Secondary | ICD-10-CM

## 2024-05-18 DIAGNOSIS — Z6831 Body mass index (BMI) 31.0-31.9, adult: Secondary | ICD-10-CM

## 2024-05-18 MED ORDER — METFORMIN HCL ER 500 MG PO TB24: ORAL_TABLET | ORAL | 1 refills | Status: DC

## 2024-05-18 NOTE — Progress Notes (Signed)
 Office: [phone]  /  Fax: [phone]  WEIGHT SUMMARY AND BIOMETRICS  Weight Lost Since Last Visit: 0  Weight Gained Since Last Visit: 1 lb   Vitals Temp: 98.3 F (36.8 C) BP: (!) 154/77 Pulse Rate: 74 SpO2: 98 %   Anthropometric Measurements Height: 5' 6 (1.676 m) Weight: 193 lb (87.5 kg) BMI (Calculated): 31.17 Weight at Last Visit: 192 lb Weight Lost Since Last Visit: 0 Weight Gained Since Last Visit: 1 lb Starting Weight: 206 lb Total Weight Loss (lbs): 13 lb (5.897 kg) Peak Weight: 207 lb   Body Composition  Body Fat %: 43.4 % Fat Mass (lbs): 83.8 lbs Muscle Mass (lbs): 103.6 lbs Total Body Water (lbs): 70.4 lbs Visceral Fat Rating : 13   Other Clinical Data Fasting: no Labs: no Today's Visit #: 7 Starting Date: 02/12/24    Total Weight Loss: 13 pounds  Bio Impedance Data reviewed with patient:down 0.8 pounds of muscle, up 1.4 pounds of adipose.   HPI  Chief Complaint: OBESITY  Angela Ramirez is here to discuss her progress with her obesity treatment plan. She is on the the Category 2 Plan and states she is following her eating plan approximately 60 % of the time. She states she is exercising - working on new house everyday to move in.    Interval History:  Since last office visit she has been moving the past month and not had her kitchen available. Has been eating out, will get roast beef sandwich at deli with vegetables. She did have hot dogs yesterday so more salt. She has not skipped meals. Yogurt, nuts and berries for breakfast.  She has not been drinking as much water. She will be completely moved to new house in 4 days.  Angela Ramirez have hypertension and is currently taking Amlodipine  5 mg every day and hydrochlorothiazide  25 mg every day.  She denies headaches, chest pain, shortness of breath at rest and dizziness.  BP Readings from Last 3 Encounters:  05/18/24 (!) 154/77  05/17/24 (!) 150/80  05/04/24 (!) 143/78   She is currently on  Metformin  500 mg 1 tab every day for insulin  resistance. Denies side effects.   She has not been sleeping as well due to anxiety with her move. She has started on Temazepam  from her PCP for insomnia until she gets settled down from her move.    PHYSICAL EXAM:  Blood pressure (!) 154/77, pulse 74, temperature 98.3 F (36.8 C), height 5' 6 (1.676 m), weight 193 lb (87.5 kg), SpO2 98%. Body mass index is 31.15 kg/m.  General: Well Developed, well nourished, and in no acute distress.  HEENT: Normocephalic, atraumatic; EOMI, sclerae are anicteric. Skin: Warm and dry, good turgor Chest:  Normal excursion, shape, no gross ABN Respiratory: No conversational dyspnea; speaking in full sentences NeuroM-Sk:  Normal gross ROM * 4 extremities  Psych: A and O X 3, insight adequate, mood- full    DIAGNOSTIC DATA REVIEWED:  BMET    Component Value Date/Time   NA 138 03/02/2024 1027   NA 134 02/12/2024 0940   K 4.0 03/02/2024 1027   CL 102 03/02/2024 1027   CO2 29 03/02/2024 1027   GLUCOSE 89 03/02/2024 1027   BUN 24 (H) 03/02/2024 1027   BUN 13 02/12/2024 0940   CREATININE 0.69 03/02/2024 1027   CREATININE 0.80 04/09/2018 1011   CALCIUM 10.3 03/02/2024 1027   GFRNONAA >60 06/15/2023 0631   GFRNONAA 75 04/09/2018 1011   GFRAA 87 04/09/2018 1011  Lab Results  Component Value Date   HGBA1C 5.9 (H) 02/12/2024   HGBA1C 5.8 08/18/2017   Lab Results  Component Value Date   INSULIN  10.9 02/12/2024   Lab Results  Component Value Date   TSH 1.730 02/12/2024   CBC    Component Value Date/Time   WBC 7.2 03/02/2024 1027   RBC 4.48 03/02/2024 1027   HGB 13.2 03/02/2024 1027   HGB 13.6 02/12/2024 0940   HCT 39.6 03/02/2024 1027   HCT 42.4 02/12/2024 0940   PLT 276.0 03/02/2024 1027   PLT 263 02/12/2024 0940   MCV 88.4 03/02/2024 1027   MCV 90 02/12/2024 0940   MCH 28.9 02/12/2024 0940   MCH 28.5 06/15/2023 0631   MCHC 33.3 03/02/2024 1027   RDW 14.8 03/02/2024 1027   RDW 14.8  02/12/2024 0940     Lipid Panel     Component Value Date/Time   CHOL 195 02/12/2024 0940   TRIG 81 02/12/2024 0940   HDL 72 02/12/2024 0940   CHOLHDL 2.7 02/12/2024 0940   CHOLHDL 3 01/27/2023 1016   VLDL 15.8 01/27/2023 1016   LDLCALC 108 (H) 02/12/2024 0940     Nutritional Lab Results  Component Value Date   VD25OH 108.0 (H) 02/12/2024   VD25OH 41.24 01/27/2023   VD25OH 35.90 10/16/2021     ASSESSMENT AND PLAN  Class 1 obesity with serious comorbidity and body mass index (BMI) of 31.0 to 31.9 in adult, unspecified obesity type TREATMENT PLAN FOR OBESITY:  Recommended Dietary Goals  Angela Ramirez is currently in the action stage of change. As such, her goal is to continue weight management plan. She has agreed to the Category 1 Plan.  Behavioral Intervention  We discussed the following Behavioral Modification Strategies today: decreasing eating out or consumption of processed foods, and making healthy choices when eating convenient foods while she needs to eat out during her move, celebration eating strategies- one plate with protein as largest portion, clean vegetable and then portion of anything she wants to try but food cannot touch on the plate, be mindful and enjoy your food  , continue to work on maintaining a reduced calorie state, getting the recommended amount of protein, incorporating whole foods, making healthy choices, staying well hydrated and practicing mindfulness when eating., and increase protein intake, fibrous foods (25 grams per day for women, 30 grams for men) and water to improve satiety and decrease hunger signals. .    Recommended Physical Activity Goals  Angela Ramirez has been advised to work up to 150 minutes of moderate intensity aerobic activity a week and strengthening exercises 2-3 times per week for cardiovascular health, weight loss maintenance and preservation of muscle mass.   She has agreed to Increase physical activity in their day and reduce  sedentary time (increase NEAT)., Increase volume of physical activity to a goal of 240 minutes a week, and Combine aerobic and strengthening exercises for efficiency and improved cardiometabolic health.   Pharmacotherapy We discussed various medication options to help Angela Ramirez with her weight loss efforts and we both agreed to continue Metformin  XR 500 mg 1 tab PO every day for insulin  resistance and off label for weight loss.  ASSOCIATED CONDITIONS ADDRESSED TODAY  Action/Plan  Essential hypertension Continue amlodipine  5 mg every day and hydrochlorothiazide  25 mg every day Continue Category 1 meal plan  and DASH diet Monitor BP and if consistently >140/90 notify PCP If develops headaches, chest pain, shortness of breath or dizziness go to ER Continue to follow  regularly with PCP   Vitamin D  deficiency Low vitamin D  levels can be associated with adiposity and may result in leptin resistance and weight gain. Also associated with fatigue.  Currently on vitamin D  supplementation without any adverse effects such as nausea, vomiting or muscle weakness.    Insulin  resistance Prediabetes Continue Category 1  meal plan, limit simple carbohydrates Continue Metformin  500 mg 1 tab PO every day  Continue exercise with current goal of 240 minutes of moderate to high intensity exercise/week with strength training -     metFORMIN  HCl ER; Take 1 tablet daily with largest meal.  Dispense: 30 tablet; Refill: 1         Return in about 3 weeks (around 06/08/2024).SABRA She was informed of the importance of frequent follow up visits to maximize her success with intensive lifestyle modifications for her multiple health conditions.   ATTESTASTION STATEMENTS:  Reviewed by clinician on day of visit: allergies, medications, problem list, medical history, surgical history, family history, social history, and previous encounter notes.   I personally spent a total of 35 minutes in the care of the patient  today including preparing to see the patient, getting/reviewing separately obtained history, performing a medically appropriate exam/evaluation, counseling and educating, placing orders, and documenting clinical information in the EHR.   Lyn Deemer ANP-C

## 2024-05-22 ENCOUNTER — Other Ambulatory Visit (INDEPENDENT_AMBULATORY_CARE_PROVIDER_SITE_OTHER): Payer: Self-pay | Admitting: Nurse Practitioner

## 2024-05-22 DIAGNOSIS — R7303 Prediabetes: Secondary | ICD-10-CM

## 2024-06-03 ENCOUNTER — Ambulatory Visit (INDEPENDENT_AMBULATORY_CARE_PROVIDER_SITE_OTHER): Admitting: Family Medicine

## 2024-06-03 ENCOUNTER — Encounter (INDEPENDENT_AMBULATORY_CARE_PROVIDER_SITE_OTHER): Payer: Self-pay | Admitting: Family Medicine

## 2024-06-03 DIAGNOSIS — E66813 Obesity, class 3: Secondary | ICD-10-CM

## 2024-06-03 DIAGNOSIS — I10 Essential (primary) hypertension: Secondary | ICD-10-CM

## 2024-06-03 DIAGNOSIS — Z6841 Body Mass Index (BMI) 40.0 and over, adult: Secondary | ICD-10-CM

## 2024-06-03 DIAGNOSIS — R7303 Prediabetes: Secondary | ICD-10-CM | POA: Diagnosis not present

## 2024-06-03 DIAGNOSIS — Z6831 Body mass index (BMI) 31.0-31.9, adult: Secondary | ICD-10-CM

## 2024-06-03 DIAGNOSIS — E559 Vitamin D deficiency, unspecified: Secondary | ICD-10-CM | POA: Diagnosis not present

## 2024-06-03 DIAGNOSIS — E782 Mixed hyperlipidemia: Secondary | ICD-10-CM | POA: Diagnosis not present

## 2024-06-03 NOTE — Progress Notes (Signed)
 Angela Ramirez, D.O.  ABFM, ABOM Specializing in Clinical Bariatric Medicine  Office located at: [address] [place], [place]  [address]   Obtain A1c, CMP, Fasting Insulin , Vit D at next OV.    A) FOR THE CHRONIC DISEASE OF OBESITY:  Chief complaint: Obesity Angela Ramirez is here to discuss her progress with her obesity treatment plan.   History of present illness / Interval history:  Angela Ramirez is here today for her follow-up office visit.  Since last OV on 05/18/24, pt is down 3 lbs. Patient states that she just finished moving and she is trying to settle in. She endorses trying to eat on plan.   Daughter Angela Ramirez is here with her    05/18/24 06/03/24   Body Fat % 43.4 % 43.7 %  Muscle Mass (lbs) 103.6 lbs 101.6 lbs  Fat Mass (lbs) 83.8 lbs 83 lbs  Total Body Water (lbs) 70.4 lbs 71.4 lbs  Visceral Fat Rating  13 13    Counseling done on how various foods will affect these numbers and how to maximize success.  Total lbs lost to date: - 16 lbs Total Fat Mass in lbs lost to date: - 11.2 lbs Total weight loss percentage to date: - 7.77 %    Morbid obesity (HCC) start 41.6 Class 3 severe obesity, current BMI 31  Nutrition Therapy She is on the Category 2 Plan and states she is following her eating plan approximately 50 % of the time.   - Tracking Calories/Macros: yes  - Eating More Whole Foods: yes  - Adequate Protein Intake: yes  - Adequate Water Intake: no   - Skipping Meals: no   - Sleeping 7-9 Hours/ Night: yes   Angela Ramirez is currently in the action stage of change. As such, her goal is to continue weight management plan.  She has agreed to: Journal 1100-1200 calories with 80 + g protein    Physical Activity Katie is not exercising.   Sheyla has been advised to work up to 300-450 minutes of moderate intensity aerobic activity a week and strengthening exercises 2-3 times per week for cardiovascular health, weight loss maintenance and preservation  of muscle mass.  She has agreed to : Think about enjoyable ways to increase daily physical activity and overcoming barriers to exercise and Combine aerobic and strengthening exercises for efficiency and improved cardiometabolic health.   Behavioral Modifications Evidence-based interventions for health behavior change were utilized today including the discussion of   Regarding patient's less desirable eating habits and patterns, we employed the technique of small changes.   We discussed the following today: increasing lean protein intake to established goals, work on meal planning and preparation, decreasing eating out or consumption of processed foods, and making healthy choices when eating convenient foods, and continue to work on maintaining a reduced calorie state, getting the recommended amount of protein, incorporating whole foods, making healthy choices, staying well hydrated and practicing mindfulness when eating. Additional resources provided today: Handout on CAT 2 meal plan and Handout on Daily Food Journaling Log, Handout on Sample Grocery List    Medical Interventions/ Pharmacotherapy Previous Bariatric surgery: n/a Pharmacotherapy for weight loss: She is currently taking Metformin  XR 500 mg once daily for medical weight loss.    We discussed various medication options to help Dwan with her weight loss efforts and we both agreed to : Adequate clinical response to anti-obesity medication, continue current regimen   B) OBESITY RELATED CONDITIONS ADDRESSED TODAY:  Prediabetes  Assessment & Plan Lab Results  Component Value Date   HGBA1C 5.9 (H) 02/12/2024   HGBA1C 6.0 01/27/2023   HGBA1C 6.0 11/10/2020   INSULIN  10.9 02/12/2024    On Metformin  XR 500 mg once daily. With reported good compliance. Patient states that some time ago she had discontinued the use of Metformin  and noticed an increase in cravings so she restarted the Metformin . Since then she has had no problems with  excessive hunger and cravings. Patient declines obtaining labs today. Continue following prudent meal plan and medication.    Essential hypertension Assessment & Plan BP Readings from Last 3 Encounters:  06/03/24 (!) 164/78  05/18/24 (!) 154/77  05/17/24 (!) 150/80   The ASCVD Risk score (Arnett DK, et al., 2019) failed to calculate for the following reasons:   Risk score cannot be calculated because patient has a medical history suggesting prior/existing ASCVD   * - Cholesterol units were assumed  Lab Results  Component Value Date   CREATININE 0.69 03/02/2024   On Amlodipine  5 mg with reported good compliance and tolerance. BP today is not at goal - poorly controlled. Patient states that she has not taken her hydrochlorothiazide  25 mg every day. She reports that it dries her eyes out really bad so she will take it after she gets back home. She reports that she was also not able to get a refill on her medications so she has just been back on them for about a week. Patient is asx. Patient endorses checking her BP at home and it runs in the 140s over 80s. Stressed to patient how having a high BP will hurt her kidneys. Continue with medications and following prudent meal plan low in sodium.      Mixed hyperlipidemia Assessment & Plan Lab Results  Component Value Date   CHOL 195 02/12/2024   HDL 72 02/12/2024   LDLCALC 108 (H) 02/12/2024   TRIG 81 02/12/2024   CHOLHDL 2.7 02/12/2024   Diet and life style controlled. Patients LDL levels were elevated. Recommended that patient recheck labs today. Continue following prudent meal plan and decreasing foods high in saturated and trans fat.     Vitamin D  deficiency Assessment & Plan Lab Results  Component Value Date   VD25OH 108.0 (H) 02/12/2024   VD25OH 41.24 01/27/2023   VD25OH 35.90 10/16/2021   Patient was previously on a Vit D supplementation but discontinued 3 months ago. Previous Vit D levels are at goal. Recommended that  patient get labs rechecked. Patient declines today. Will recheck labs at next OV.   Follow up:   Return 06/14/2024 at 10:30 AM  She was informed of the importance of frequent follow up visits to maximize her success with intensive lifestyle modifications for her multiple health conditions.   Weight Summary and Biometrics   Weight Lost Since Last Visit: 3lb  Weight Gained Since Last Visit: 0lb   Vitals Temp: 98.2 F (36.8 C) BP: (!) 164/78 Pulse Rate: 78 SpO2: 99 %   Anthropometric Measurements Height: 5' 6 (1.676 m) Weight: 190 lb (86.2 kg) BMI (Calculated): 30.68 Weight at Last Visit: 193lb Weight Lost Since Last Visit: 3lb Weight Gained Since Last Visit: 0lb Starting Weight: 206lb Total Weight Loss (lbs): 16 lb (7.258 kg) Peak Weight: 207lb   Body Composition  Body Fat %: 43.7 % Fat Mass (lbs): 83 lbs Muscle Mass (lbs): 101.6 lbs Total Body Water (lbs): 71.4 lbs Visceral Fat Rating : 13   Other Clinical Data Fasting: No Labs:  no Today's Visit #: 8 Starting Date: 02/12/24    Objective:   PHYSICAL EXAM: Blood pressure (!) 164/78, pulse 78, temperature 98.2 F (36.8 C), height 5' 6 (1.676 m), weight 190 lb (86.2 kg), SpO2 99%. Body mass index is 30.67 kg/m.  General: she is overweight, cooperative and in no acute distress. PSYCH: Has normal mood, affect and thought process.   HEENT: EOMI, sclerae are anicteric. Lungs: Normal breathing effort, no conversational dyspnea. Extremities: Moves * 4 Neurologic: A and O * 3, good insight  DIAGNOSTIC DATA REVIEWED: BMET    Component Value Date/Time   NA 138 03/02/2024 1027   NA 134 02/12/2024 0940   K 4.0 03/02/2024 1027   CL 102 03/02/2024 1027   CO2 29 03/02/2024 1027   GLUCOSE 89 03/02/2024 1027   BUN 24 (H) 03/02/2024 1027   BUN 13 02/12/2024 0940   CREATININE 0.69 03/02/2024 1027   CREATININE 0.80 04/09/2018 1011   CALCIUM 10.3 03/02/2024 1027   GFRNONAA >60 06/15/2023 0631   GFRNONAA 75  04/09/2018 1011   GFRAA 87 04/09/2018 1011   Lab Results  Component Value Date   HGBA1C 5.9 (H) 02/12/2024   HGBA1C 5.8 08/18/2017   Lab Results  Component Value Date   INSULIN  10.9 02/12/2024   Lab Results  Component Value Date   TSH 1.730 02/12/2024   CBC    Component Value Date/Time   WBC 7.2 03/02/2024 1027   RBC 4.48 03/02/2024 1027   HGB 13.2 03/02/2024 1027   HGB 13.6 02/12/2024 0940   HCT 39.6 03/02/2024 1027   HCT 42.4 02/12/2024 0940   PLT 276.0 03/02/2024 1027   PLT 263 02/12/2024 0940   MCV 88.4 03/02/2024 1027   MCV 90 02/12/2024 0940   MCH 28.9 02/12/2024 0940   MCH 28.5 06/15/2023 0631   MCHC 33.3 03/02/2024 1027   RDW 14.8 03/02/2024 1027   RDW 14.8 02/12/2024 0940   Iron Studies    Component Value Date/Time   IRON 30 (L) 03/02/2024 1027   TIBC 338.8 03/02/2024 1027   FERRITIN 26.1 03/02/2024 1027   IRONPCTSAT 8.9 (L) 03/02/2024 1027   Lipid Panel     Component Value Date/Time   CHOL 195 02/12/2024 0940   TRIG 81 02/12/2024 0940   HDL 72 02/12/2024 0940   CHOLHDL 2.7 02/12/2024 0940   CHOLHDL 3 01/27/2023 1016   VLDL 15.8 01/27/2023 1016   LDLCALC 108 (H) 02/12/2024 0940   Hepatic Function Panel     Component Value Date/Time   PROT 8.0 03/02/2024 1027   PROT 7.9 02/12/2024 0940   ALBUMIN 4.3 03/02/2024 1027   ALBUMIN 4.7 02/12/2024 0940   AST 20 03/02/2024 1027   ALT 13 03/02/2024 1027   ALT 25 09/17/2017 1435   ALKPHOS 69 03/02/2024 1027   BILITOT 0.3 03/02/2024 1027   BILITOT 0.5 02/12/2024 0940      Component Value Date/Time   TSH 1.730 02/12/2024 0940   Nutritional Lab Results  Component Value Date   VD25OH 108.0 (H) 02/12/2024   VD25OH 41.24 01/27/2023   VD25OH 35.90 10/16/2021    Attestations:   LILLETTE Sonny Laroche, acting as a stage manager for Angela Jenkins, DO., have compiled all relevant documentation for today's office visit on behalf of Angela Jenkins, DO, while in the presence of [employer],  DO.  I have reviewed the above documentation for accuracy and completeness, and I agree with the above. Angela JINNY Ramirez, D.O.  The 21st Century Cures  Act was signed into law in 2016 which includes the topic of electronic health records.  This provides immediate access to information in MyChart.  This includes consultation notes, operative notes, office notes, lab results and pathology reports.  If you have any questions about what you read please let us  know at your next visit so we can discuss your concerns and take corrective action if need be.  We are right here with you.

## 2024-06-14 ENCOUNTER — Ambulatory Visit (INDEPENDENT_AMBULATORY_CARE_PROVIDER_SITE_OTHER): Admitting: Nurse Practitioner

## 2024-06-22 ENCOUNTER — Ambulatory Visit (INDEPENDENT_AMBULATORY_CARE_PROVIDER_SITE_OTHER): Admitting: Nurse Practitioner

## 2024-06-29 ENCOUNTER — Ambulatory Visit (INDEPENDENT_AMBULATORY_CARE_PROVIDER_SITE_OTHER): Admitting: Nurse Practitioner

## 2024-07-07 ENCOUNTER — Ambulatory Visit (INDEPENDENT_AMBULATORY_CARE_PROVIDER_SITE_OTHER): Admitting: Nurse Practitioner

## 2024-07-07 ENCOUNTER — Encounter (INDEPENDENT_AMBULATORY_CARE_PROVIDER_SITE_OTHER): Payer: Self-pay | Admitting: Nurse Practitioner

## 2024-07-07 VITALS — BP 152/77 | HR 83 | Temp 98.1°F | Ht 66.0 in | Wt 190.0 lb

## 2024-07-07 DIAGNOSIS — E66811 Obesity, class 1: Secondary | ICD-10-CM | POA: Diagnosis not present

## 2024-07-07 DIAGNOSIS — E559 Vitamin D deficiency, unspecified: Secondary | ICD-10-CM

## 2024-07-07 DIAGNOSIS — Z6830 Body mass index (BMI) 30.0-30.9, adult: Secondary | ICD-10-CM

## 2024-07-07 DIAGNOSIS — R7303 Prediabetes: Secondary | ICD-10-CM | POA: Diagnosis not present

## 2024-07-07 DIAGNOSIS — I10 Essential (primary) hypertension: Secondary | ICD-10-CM | POA: Diagnosis not present

## 2024-07-07 DIAGNOSIS — E88819 Insulin resistance, unspecified: Secondary | ICD-10-CM

## 2024-07-07 MED ORDER — METFORMIN HCL ER 500 MG PO TB24: ORAL_TABLET | ORAL | 1 refills | Status: AC

## 2024-07-07 NOTE — Progress Notes (Signed)
 " Office: [phone]  /  Fax: [phone]  WEIGHT SUMMARY AND BIOMETRICS  Weight Lost Since Last Visit: 0  Weight Gained Since Last Visit: 0   Vitals Temp: 98.1 F (36.7 C) BP: (!) 152/77 (takes part of B/P meds at night) Pulse Rate: 83 SpO2: 97 %   Anthropometric Measurements Height: 5' 6 (1.676 m) Weight: 190 lb (86.2 kg) BMI (Calculated): 30.68 Weight at Last Visit: 190 lb Weight Lost Since Last Visit: 0 Weight Gained Since Last Visit: 0 Starting Weight: 206 lb Total Weight Loss (lbs): 16 lb (7.258 kg) Peak Weight: 207 lb   Body Composition  Body Fat %: 42.5 % Fat Mass (lbs): 80.8 lbs Muscle Mass (lbs): 103.6 lbs Total Body Water (lbs): 67.8 lbs Visceral Fat Rating : 13   Other Clinical Data Fasting: no Labs: no Today's Visit #: 9 Starting Date: 02/12/24    Total Weight Loss: 16 pounds Percent body weight lost: 7.8% Bio Impedance Data reviewed with patient: Muscle is up 2 pounds, adipose is down 2.2 pounds.   HPI  Chief Complaint: OBESITY  Davi is here to discuss her progress with her obesity treatment plan. She is on the the Category 2 Plan and states she is following her eating plan approximately 75 % of the time. She states she is exercising 30 minutes 6 days per week.   Interval History:  Since last office visit she has continued to do walking and has started some shoulder exercises. She is now settled in her new house.  She has been doing yogurt with flax seed and chia seeds and blueberries/walnuts. This has also helped control her constipation.  She is not skipping meals.  She is getting 96 ounces of water a day. She is getting at least 90 grams of protein a day.  She is completely off Tramadol  for pain and rarely take tizanidine . Leg pain has improved.  Rare use of Meloxicam  for her shoulder pain.   Nikolina does have hypertension and is currently on amlodipine  5 mg every day and HCTZ 25 mg every day. Denies headaches, chest pain, shortness  of breath at rest and dizziness. BP at home was 129/73. BP Readings from Last 3 Encounters:  07/07/24 (!) 152/77  06/03/24 (!) 164/78  05/18/24 (!) 154/77   She does have insulin  resistance and is currently on Metformin  500 mg every day- denies side effects with medication.  She is currently taking Vit D3/K2 supplementation every other night as last value was 108.0.  PHYSICAL EXAM:  Blood pressure (!) 152/77, pulse 83, temperature 98.1 F (36.7 C), height 5' 6 (1.676 m), weight 190 lb (86.2 kg), SpO2 97%. Body mass index is 30.67 kg/m.  General: Well Developed, well nourished, and in no acute distress.  HEENT: Normocephalic, atraumatic; EOMI, sclerae are anicteric. Skin: Warm and dry, good turgor Chest:  Normal excursion, shape, no gross ABN Respiratory: No conversational dyspnea; speaking in full sentences NeuroM-Sk:  Normal gross ROM * 4 extremities  Psych: A and O X 3, insight adequate, mood- full    DIAGNOSTIC DATA REVIEWED:  BMET    Component Value Date/Time   NA 138 03/02/2024 1027   NA 134 02/12/2024 0940   K 4.0 03/02/2024 1027   CL 102 03/02/2024 1027   CO2 29 03/02/2024 1027   GLUCOSE 89 03/02/2024 1027   BUN 24 (H) 03/02/2024 1027   BUN 13 02/12/2024 0940   CREATININE 0.69 03/02/2024 1027   CREATININE 0.80 04/09/2018 1011   CALCIUM 10.3 03/02/2024 1027  GFRNONAA >60 06/15/2023 0631   GFRNONAA 75 04/09/2018 1011   GFRAA 87 04/09/2018 1011   Lab Results  Component Value Date   HGBA1C 5.9 (H) 02/12/2024   HGBA1C 5.8 08/18/2017   Lab Results  Component Value Date   INSULIN  10.9 02/12/2024   Lab Results  Component Value Date   TSH 1.730 02/12/2024   CBC    Component Value Date/Time   WBC 7.2 03/02/2024 1027   RBC 4.48 03/02/2024 1027   HGB 13.2 03/02/2024 1027   HGB 13.6 02/12/2024 0940   HCT 39.6 03/02/2024 1027   HCT 42.4 02/12/2024 0940   PLT 276.0 03/02/2024 1027   PLT 263 02/12/2024 0940   MCV 88.4 03/02/2024 1027   MCV 90 02/12/2024  0940   MCH 28.9 02/12/2024 0940   MCH 28.5 06/15/2023 0631   MCHC 33.3 03/02/2024 1027   RDW 14.8 03/02/2024 1027   RDW 14.8 02/12/2024 0940   Iron Studies    Component Value Date/Time   IRON 30 (L) 03/02/2024 1027   TIBC 338.8 03/02/2024 1027   FERRITIN 26.1 03/02/2024 1027   IRONPCTSAT 8.9 (L) 03/02/2024 1027   Lipid Panel     Component Value Date/Time   CHOL 195 02/12/2024 0940   TRIG 81 02/12/2024 0940   HDL 72 02/12/2024 0940   CHOLHDL 2.7 02/12/2024 0940   CHOLHDL 3 01/27/2023 1016   VLDL 15.8 01/27/2023 1016   LDLCALC 108 (H) 02/12/2024 0940   Hepatic Function Panel     Component Value Date/Time   PROT 8.0 03/02/2024 1027   PROT 7.9 02/12/2024 0940   ALBUMIN 4.3 03/02/2024 1027   ALBUMIN 4.7 02/12/2024 0940   AST 20 03/02/2024 1027   ALT 13 03/02/2024 1027   ALT 25 09/17/2017 1435   ALKPHOS 69 03/02/2024 1027   BILITOT 0.3 03/02/2024 1027   BILITOT 0.5 02/12/2024 0940      Component Value Date/Time   TSH 1.730 02/12/2024 0940   Nutritional Lab Results  Component Value Date   VD25OH 108.0 (H) 02/12/2024   VD25OH 41.24 01/27/2023   VD25OH 35.90 10/16/2021     ASSESSMENT AND PLAN Class 1 obesity with serious comorbidity and body mass index (BMI) of 30.0 to 30.9 in adult, unspecified obesity type  TREATMENT PLAN FOR OBESITY:  Recommended Dietary Goals  Kadijah is currently in the action stage of change. As such, her goal is to continue weight management plan. She has agreed to the Category 2 Plan.  Behavioral Intervention  We discussed the following Behavioral Modification Strategies today: increasing lean protein intake to established goals, avoiding skipping meals, continue to work on maintaining a reduced calorie state, getting the recommended amount of protein, incorporating whole foods, making healthy choices, staying well hydrated and practicing mindfulness when eating., and increase protein intake, fibrous foods (25 grams per day for women, 30  grams for men) and water to improve satiety and decrease hunger signals. .   Recommended Physical Activity Goals  Mardie has been advised to work up to 150 minutes of moderate intensity aerobic activity a week and strengthening exercises 2-3 times per week for cardiovascular health, weight loss maintenance and preservation of muscle mass.   She has agreed to Start aerobic activity with a goal of 150 minutes a week at moderate intensity.  and Combine aerobic and strengthening exercises for efficiency and improved cardiometabolic health.   Pharmacotherapy We discussed various medication options to help Naveen with her weight loss efforts and we both agreed to  Continue Metformin  500 mg every day for insulin  resistance- denies side effects.  ASSOCIATED CONDITIONS ADDRESSED TODAY  Action/Plan  Essential hypertension Continue amlodipine  5 mg every day and HCTZ 25 mg every day Continue Category 2 meal plan  and DASH diet Monitor BP and if consistently >140/90 notify PCP If develops headaches, chest pain, shortness of breath or dizziness go to ER Continue to follow regularly with PCP  Insulin  resistance Continue Category 2  meal plan, limit simple carbohydrates. Increase lean protein, water and fiber.  Continue Metformin  500 mg every day- monitor for side effects Continue exercise with current goal of 150 minutes of moderate to high intensity exercise/week.  -     metFORMIN  HCl ER; Take 1 tablet daily with largest meal.  Dispense: 30 tablet; Refill: 1  Vitamin D  deficiency Low vitamin D  levels can be associated with adiposity and may result in leptin resistance and weight gain. Also associated with fatigue.  Currently on vitamin D  supplementation without any adverse effects such as nausea, vomiting or muscle weakness.              Return in about 4 weeks (around 08/04/2024).SABRA She was informed of the importance of frequent follow up visits to maximize her success with intensive  lifestyle modifications for her multiple health conditions.   ATTESTASTION STATEMENTS:  Reviewed by clinician on day of visit: allergies, medications, problem list, medical history, surgical history, family history, social history, and previous encounter notes.    Lonell Liverpool ANP-C "
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
## 2024-07-28 ENCOUNTER — Ambulatory Visit (INDEPENDENT_AMBULATORY_CARE_PROVIDER_SITE_OTHER): Admitting: Nurse Practitioner

## 2024-08-02 ENCOUNTER — Encounter (INDEPENDENT_AMBULATORY_CARE_PROVIDER_SITE_OTHER): Admitting: Ophthalmology

## 2024-08-02 DIAGNOSIS — H5213 Myopia, bilateral: Secondary | ICD-10-CM

## 2024-08-02 DIAGNOSIS — H532 Diplopia: Secondary | ICD-10-CM

## 2024-08-02 DIAGNOSIS — I10 Essential (primary) hypertension: Secondary | ICD-10-CM

## 2024-08-02 DIAGNOSIS — Z961 Presence of intraocular lens: Secondary | ICD-10-CM

## 2024-08-02 DIAGNOSIS — H4312 Vitreous hemorrhage, left eye: Secondary | ICD-10-CM

## 2024-08-02 DIAGNOSIS — H35033 Hypertensive retinopathy, bilateral: Secondary | ICD-10-CM

## 2024-08-02 DIAGNOSIS — H442E3 Degenerative myopia with other maculopathy, bilateral eye: Secondary | ICD-10-CM

## 2024-08-02 DIAGNOSIS — H353221 Exudative age-related macular degeneration, left eye, with active choroidal neovascularization: Secondary | ICD-10-CM

## 2024-08-02 DIAGNOSIS — H43813 Vitreous degeneration, bilateral: Secondary | ICD-10-CM

## 2024-08-16 ENCOUNTER — Ambulatory Visit (INDEPENDENT_AMBULATORY_CARE_PROVIDER_SITE_OTHER): Admitting: Nurse Practitioner
